# Patient Record
Sex: Female | Born: 1990 | Race: White | Hispanic: No | Marital: Married | Smoking: Never smoker
Health system: Southern US, Community
[De-identification: ages and names within clinical notes are randomized; demographics above are authoritative.]

## PROBLEM LIST (undated history)

## (undated) DIAGNOSIS — B009 Herpesviral infection, unspecified: Secondary | ICD-10-CM

## (undated) DIAGNOSIS — F329 Major depressive disorder, single episode, unspecified: Secondary | ICD-10-CM

## (undated) DIAGNOSIS — F419 Anxiety disorder, unspecified: Secondary | ICD-10-CM

## (undated) DIAGNOSIS — G43909 Migraine, unspecified, not intractable, without status migrainosus: Secondary | ICD-10-CM

## (undated) DIAGNOSIS — N009 Acute nephritic syndrome with unspecified morphologic changes: Secondary | ICD-10-CM

## (undated) DIAGNOSIS — F32A Depression, unspecified: Secondary | ICD-10-CM

## (undated) DIAGNOSIS — Z8489 Family history of other specified conditions: Secondary | ICD-10-CM

## (undated) DIAGNOSIS — R87629 Unspecified abnormal cytological findings in specimens from vagina: Secondary | ICD-10-CM

## (undated) HISTORY — DX: Depression, unspecified: F32.A

## (undated) HISTORY — PX: NO PAST SURGERIES: SHX2092

## (undated) HISTORY — DX: Acute nephritic syndrome with unspecified morphologic changes: N00.9

## (undated) HISTORY — DX: Major depressive disorder, single episode, unspecified: F32.9

## (undated) HISTORY — DX: Anxiety disorder, unspecified: F41.9

## (undated) HISTORY — DX: Herpesviral infection, unspecified: B00.9

## (undated) HISTORY — DX: Unspecified abnormal cytological findings in specimens from vagina: R87.629

---

## 2014-04-14 ENCOUNTER — Ambulatory Visit (INDEPENDENT_AMBULATORY_CARE_PROVIDER_SITE_OTHER): Payer: BC Managed Care – PPO | Admitting: Emergency Medicine

## 2014-04-14 VITALS — BP 115/60 | HR 87 | Temp 98.2°F | Resp 18 | Ht 63.0 in | Wt 124.0 lb

## 2014-04-14 DIAGNOSIS — F3289 Other specified depressive episodes: Secondary | ICD-10-CM

## 2014-04-14 DIAGNOSIS — F329 Major depressive disorder, single episode, unspecified: Secondary | ICD-10-CM

## 2014-04-14 DIAGNOSIS — F32A Depression, unspecified: Secondary | ICD-10-CM

## 2014-04-14 DIAGNOSIS — F411 Generalized anxiety disorder: Secondary | ICD-10-CM

## 2014-04-14 DIAGNOSIS — R51 Headache: Secondary | ICD-10-CM

## 2014-04-14 DIAGNOSIS — F419 Anxiety disorder, unspecified: Secondary | ICD-10-CM

## 2014-04-14 MED ORDER — LORAZEPAM 0.5 MG PO TABS: 0.5000 mg | ORAL_TABLET | Freq: Two times a day (BID) | ORAL | Status: DC | PRN

## 2014-04-14 NOTE — Patient Instructions (Signed)

## 2014-04-14 NOTE — Progress Notes (Signed)
[facility] [address] [place] [address] [phone]  Date:  04/14/2014   Name:  Julie Webb   DOB:  11-27-1990   MRN:  [id number]  PCP:  No PCP Per Patient    Chief Complaint: Anxiety   History of Present Illness:  Keeleigh Terris is a 23 y.o. 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  Is under treatment for depression and anxiety.  Recently her doctor cut her ativan out and her anxiety level is worse.  No thoughts of harm to self or others.  Has appt with psychiatrist in July to begin treatment.  Appetite is fair.  Sleeping a lot.  Lives with boyfriend who sells cell phones and is in school at [employer].  She is getting ready to take her boards and start a new job shortly thereafter.  Relationship with boyfriend and family is ok.   Says she has stress headaches over the past several months usually associated with clinical rotations.  No neuro or visual symptoms, antecedent illness or injury.  Says headaches occur twice or thrice a week and last all day.  No history of migraines.   Denies other complaint or health concern today.   There are no active problems to display for this patient.   Past Medical History  Diagnosis Date  . Anxiety   . Depression     History reviewed. No pertinent past surgical history.  History  Substance Use Topics  . Smoking status: Never Smoker   . Smokeless tobacco: Not on file  . Alcohol Use: Not on file    Family History  Problem Relation Age of Onset  . Hyperlipidemia Mother   . Mental illness Mother   . Hypertension Father   . Heart disease Maternal Grandmother   . Hyperlipidemia Maternal Grandmother   . Hypertension Maternal Grandmother   . Heart disease Maternal Grandfather   . Hyperlipidemia Maternal Grandfather   . Hypertension Maternal Grandfather     Not on File  Medication list has been reviewed and updated.  No current outpatient prescriptions on  file prior to visit.   No current facility-administered medications on file prior to visit.    Review of Systems:  As per HPI, otherwise negative.    Physical Examination: Filed Vitals:   04/14/14 1629  BP: 115/60  Pulse: 87  Temp: 98.2 F (36.8 C)  Resp: 18   Filed Vitals:   04/14/14 1629  Height: 5\' 3"  (1.6 m)  Weight: 124 lb (56.246 kg)   Body mass index is 21.97 kg/(m^2). Ideal Body Weight: Weight in (lb) to have BMI = 25: 140.8  GEN: WDWN, NAD, Non-toxic, A & O x 3 HEENT: Atraumatic, Normocephalic. Neck supple. No masses, No LAD. Ears and Nose: No external deformity. CV: RRR, No M/G/R. No JVD. No thrill. No extra heart sounds. PULM: CTA B, no wheezes, crackles, rhonchi. No retractions. No resp. distress. No accessory muscle use. ABD: S, NT, ND, +BS. No rebound. No HSM. EXTR: No c/c/e NEURO Normal gait.  PSYCH: Normally interactive. Conversant. Not depressed or anxious appearing.  Calm demeanor.    Assessment and Plan: Depression Anxiety Tension headaches  Signed,  Ellison Carwin, MD

## 2014-08-07 ENCOUNTER — Encounter (HOSPITAL_BASED_OUTPATIENT_CLINIC_OR_DEPARTMENT_OTHER): Payer: Self-pay | Admitting: Emergency Medicine

## 2014-08-07 ENCOUNTER — Emergency Department (HOSPITAL_BASED_OUTPATIENT_CLINIC_OR_DEPARTMENT_OTHER)
Admission: EM | Admit: 2014-08-07 | Discharge: 2014-08-07 | Disposition: A | Discharge status: Home / Self Care | Payer: 59 | Attending: Emergency Medicine | Admitting: Emergency Medicine

## 2014-08-07 DIAGNOSIS — F419 Anxiety disorder, unspecified: Secondary | ICD-10-CM | POA: Diagnosis not present

## 2014-08-07 DIAGNOSIS — F329 Major depressive disorder, single episode, unspecified: Secondary | ICD-10-CM | POA: Diagnosis not present

## 2014-08-07 DIAGNOSIS — Z79899 Other long term (current) drug therapy: Secondary | ICD-10-CM | POA: Diagnosis not present

## 2014-08-07 DIAGNOSIS — N760 Acute vaginitis: Secondary | ICD-10-CM | POA: Insufficient documentation

## 2014-08-07 DIAGNOSIS — Z3202 Encounter for pregnancy test, result negative: Secondary | ICD-10-CM | POA: Insufficient documentation

## 2014-08-07 DIAGNOSIS — N939 Abnormal uterine and vaginal bleeding, unspecified: Secondary | ICD-10-CM | POA: Insufficient documentation

## 2014-08-07 DIAGNOSIS — B9689 Other specified bacterial agents as the cause of diseases classified elsewhere: Secondary | ICD-10-CM

## 2014-08-07 LAB — URINALYSIS, ROUTINE W REFLEX MICROSCOPIC
Bilirubin Urine: NEGATIVE
Glucose, UA: NEGATIVE mg/dL
Hgb urine dipstick: NEGATIVE
KETONES UR: NEGATIVE mg/dL
Nitrite: NEGATIVE
Protein, ur: NEGATIVE mg/dL
Specific Gravity, Urine: 1.021 (ref 1.005–1.030)
UROBILINOGEN UA: 0.2 mg/dL (ref 0.0–1.0)
pH: 6.5 (ref 5.0–8.0)

## 2014-08-07 LAB — URINE MICROSCOPIC-ADD ON

## 2014-08-07 LAB — WET PREP, GENITAL
Trich, Wet Prep: NONE SEEN
Yeast Wet Prep HPF POC: NONE SEEN

## 2014-08-07 LAB — PREGNANCY, URINE: Preg Test, Ur: NEGATIVE

## 2014-08-07 MED ORDER — METRONIDAZOLE 500 MG PO TABS: 500.0000 mg | ORAL_TABLET | Freq: Two times a day (BID) | ORAL | Status: DC

## 2014-08-07 MED ORDER — CEFTRIAXONE SODIUM 250 MG IJ SOLR
250.0000 mg | Freq: Once | INTRAMUSCULAR | Status: AC
  Administered 2014-08-07: 250 mg via INTRAMUSCULAR
  Filled 2014-08-07: qty 250

## 2014-08-07 MED ORDER — AZITHROMYCIN 250 MG PO TABS
1000.0000 mg | ORAL_TABLET | Freq: Once | ORAL | Status: AC
  Administered 2014-08-07: 1000 mg via ORAL
  Filled 2014-08-07: qty 4

## 2014-08-07 NOTE — ED Notes (Signed)
Pt admits to passing moderate amount vaginal tissue 5 days ago with vaginal  bleeding for the next 4 days, today pt denies  Currently having vaginal bleeding

## 2014-08-07 NOTE — ED Notes (Signed)
Pt sts apprx. 5 days ago she experienced some pink tissue-like discharge and then had vaginal bleeding for 4 days. Today she has some slight cramping.

## 2014-08-07 NOTE — Discharge Instructions (Signed)
Bacterial Vaginosis Bacterial vaginosis is a vaginal infection that occurs when the normal balance of bacteria in the vagina is disrupted. It results from an overgrowth of certain bacteria. This is the most common vaginal infection in women of childbearing age. Treatment is important to prevent complications, especially in pregnant women, as it can cause a premature delivery. CAUSES  Bacterial vaginosis is caused by an increase in harmful bacteria that are normally present in smaller amounts in the vagina. Several different kinds of bacteria can cause bacterial vaginosis. However, the reason that the condition develops is not fully understood. RISK FACTORS Certain activities or behaviors can put you at an increased risk of developing bacterial vaginosis, including:  Having a new sex partner or multiple sex partners.  Douching.  Using an intrauterine device (IUD) for contraception. Women do not get bacterial vaginosis from toilet seats, bedding, swimming pools, or contact with objects around them. SIGNS AND SYMPTOMS  Some women with bacterial vaginosis have no signs or symptoms. Common symptoms include:  Grey vaginal discharge.  A fishlike odor with discharge, especially after sexual intercourse.  Itching or burning of the vagina and vulva.  Burning or pain with urination. DIAGNOSIS  Your health care provider will take a medical history and examine the vagina for signs of bacterial vaginosis. A sample of vaginal fluid may be taken. Your health care provider will look at this sample under a microscope to check for bacteria and abnormal cells. A vaginal pH test may also be done.  TREATMENT  Bacterial vaginosis may be treated with antibiotic medicines. These may be given in the form of a pill or a vaginal cream. A second round of antibiotics may be prescribed if the condition comes back after treatment.  HOME CARE INSTRUCTIONS   Only take over-the-counter or prescription medicines as  directed by your health care provider.  If antibiotic medicine was prescribed, take it as directed. Make sure you finish it even if you start to feel better.  Do not have sex until treatment is completed.  Tell all sexual partners that you have a vaginal infection. They should see their health care provider and be treated if they have problems, such as a mild rash or itching.  Practice safe sex by using condoms and only having one sex partner. SEEK MEDICAL CARE IF:   Your symptoms are not improving after 3 days of treatment.  You have increased discharge or pain.  You have a fever. MAKE SURE YOU:   Understand these instructions.  Will watch your condition.  Will get help right away if you are not doing well or get worse. FOR MORE INFORMATION  Centers for Disease Control and Prevention, Division of STD Prevention: AppraiserFraud.fi American Sexual Health Association (ASHA): www.ashastd.org  Document Released: 10/06/2005 Document Revised: 07/27/2013 Document Reviewed: 05/18/2013 [facility] Patient Information 2015 [place], [place]. This information is not intended to replace advice given to you by your health care provider. Make sure you discuss any questions you have with your health care provider. Abnormal Uterine Bleeding Abnormal uterine bleeding can affect women at various stages in life, including teenagers, women in their reproductive years, pregnant women, and women who have reached menopause. Several kinds of uterine bleeding are considered abnormal, including:  Bleeding or spotting between periods.   Bleeding after sexual intercourse.   Bleeding that is heavier or more than normal.   Periods that last longer than usual.  Bleeding after menopause.  Many cases of abnormal uterine bleeding are minor and simple to treat,  while others are more serious. Any type of abnormal bleeding should be evaluated by your health care provider. Treatment will depend on the cause of the  bleeding. HOME CARE INSTRUCTIONS Monitor your condition for any changes. The following actions may help to alleviate any discomfort you are experiencing:  Avoid the use of tampons and douches as directed by your health care provider.  Change your pads frequently. You should get regular pelvic exams and Pap tests. Keep all follow-up appointments for diagnostic tests as directed by your health care provider.  SEEK MEDICAL CARE IF:   Your bleeding lasts more than 1 week.   You feel dizzy at times.  SEEK IMMEDIATE MEDICAL CARE IF:   You pass out.   You are changing pads every 15 to 30 minutes.   You have abdominal pain.  You have a fever.   You become sweaty or weak.   You are passing large blood clots from the vagina.   You start to feel nauseous and vomit. MAKE SURE YOU:   Understand these instructions.  Will watch your condition.  Will get help right away if you are not doing well or get worse. Document Released: 10/06/2005 Document Revised: 10/11/2013 Document Reviewed: 05/05/2013 [facility] Patient Information 2015 [place], [place]. This information is not intended to replace advice given to you by your health care provider. Make sure you discuss any questions you have with your health care provider.

## 2014-08-07 NOTE — ED Provider Notes (Signed)
CSN: [id number]     Arrival date & time 08/07/14  2121 History  This chart was scribed for Julie Freiberg, MD by Tula Nakayama, ED Scribe. This patient was seen in room MH01/MH01 and the patient's care was started at 10:38 PM.   Chief Complaint  Patient presents with  . Vaginal Bleeding   The history is provided by the patient. No language interpreter was used.   HPI Comments: Jasline Buskirk is a 23 y.o. female with a history of depression and anxiety who presents to the Emergency Department after passing vaginal tissue, which she describes as the size of her hand, 5 days ago followed by vaginal bleeding for 4 days. Her LNMP was 6 weeks prior to onset of symptoms. Pt states her menstrual period normally occurs every 4 weeks and lasts 7 days. She denies knowledge of recent pregnancy. Pt denies painful urination as an associated symptom. She has had some abd cramping.  Timing of symptoms once, duration constant. No exacerbating or alleviating factors.   Past Medical History  Diagnosis Date  . Anxiety   . Depression    History reviewed. No pertinent past surgical history. Family History  Problem Relation Age of Onset  . Hyperlipidemia Mother   . Mental illness Mother   . Hypertension Father   . Heart disease Maternal Grandmother   . Hyperlipidemia Maternal Grandmother   . Hypertension Maternal Grandmother   . Heart disease Maternal Grandfather   . Hyperlipidemia Maternal Grandfather   . Hypertension Maternal Grandfather    History  Substance Use Topics  . Smoking status: Never Smoker   . Smokeless tobacco: Not on file  . Alcohol Use: Yes   OB History   Grav Para Term Preterm Abortions TAB SAB Ect Mult Living                 Review of Systems  Genitourinary: Positive for vaginal bleeding. Negative for dysuria and difficulty urinating.  All other systems reviewed and are negative.     Allergies  Review of patient's allergies indicates no known allergies.  Home Medications    Prior to Admission medications   Medication Sig Start Date End Date Taking? Authorizing Provider  buPROPion (WELLBUTRIN) 100 MG tablet Take 100 mg by mouth 2 (two) times daily.    Historical Provider, MD  FLUoxetine (PROZAC) 10 MG tablet Take 30 mg by mouth daily.    Historical Provider, MD  LORazepam (ATIVAN) 0.5 MG tablet Take 1 tablet (0.5 mg total) by mouth 2 (two) times daily as needed for anxiety. 04/14/14   Roselee Culver, MD  Norgestimate-Ethinyl Estradiol Triphasic (ORTHO TRI-CYCLEN LO) 0.18/0.215/0.25 MG-25 MCG tab Take 1 tablet by mouth daily.    Historical Provider, MD   BP 159/82  Pulse 96  Temp(Src) 98.2 F (36.8 C) (Oral)  Resp 18  Ht 5\' 5"  (1.651 m)  Wt 120 lb (54.432 kg)  BMI 19.97 kg/m2  SpO2 100%  LMP 06/26/2014 Physical Exam  Vitals reviewed. Constitutional: She is oriented to person, place, and time. She appears well-developed and well-nourished. No distress.  HENT:  Head: Normocephalic and atraumatic.  Right Ear: External ear normal.  Left Ear: External ear normal.  Eyes: Conjunctivae and EOM are normal. Pupils are equal, round, and reactive to light.  Neck: Normal range of motion. Neck supple. No tracheal deviation present.  Cardiovascular: Normal rate, regular rhythm, normal heart sounds and intact distal pulses.   Pulmonary/Chest: Effort normal and breath sounds normal. No respiratory distress.  Abdominal:  Soft. Bowel sounds are normal. There is no tenderness.  Genitourinary: Cervix exhibits motion tenderness, discharge (yellow/[place]) and friability.  Musculoskeletal: Normal range of motion.  Neurological: She is alert and oriented to person, place, and time.  Skin: Skin is warm and dry.  Psychiatric: She has a normal mood and affect. Her behavior is normal.    ED Course  Procedures (including critical care time) DIAGNOSTIC STUDIES: Oxygen Saturation is 100% on RA, normal by my interpretation.    COORDINATION OF CARE: 10:41 PM Discussed  treatment plan with pt at bedside and pt agreed to plan.  Labs Review Labs Reviewed  URINALYSIS, ROUTINE W REFLEX MICROSCOPIC - Abnormal; Notable for the following:    APPearance CLOUDY (*)    Leukocytes, UA LARGE (*)    All other components within normal limits  URINE MICROSCOPIC-ADD ON - Abnormal; Notable for the following:    Squamous Epithelial / LPF FEW (*)    Bacteria, UA MANY (*)    All other components within normal limits  PREGNANCY, URINE    Imaging Review No results found.   EKG Interpretation None      MDM   Final diagnoses:  None    23 y.o. female with pertinent PMH of anxiety, depression presents with 4 days of vaginal bleeding and passage of tissue.  Patient denies urinary symptoms, has not had anything other than some mild cramping. She did not taken pregnancy tests, her last menstrual period was 6 weeks prior to current bleeding episode.  On arrival today vitals signs and physical exam as above. Physical exam demonstrated erythematous and friable cervix with some yellow discharge. Treated empirically for cervicitis with Rocephin and azithromycin. Wet prep revealed bacterial vaginosis. Urine pregnancy test other labs today unremarkable. Patient currently denies dysuria. Will not treat asymptomatic bacteriuria. Patient discharged home with standard return precautions and Flagyl.    1. Vaginal bleeding   2. Bacterial vaginosis           Julie Freiberg, MD 08/07/14 [phone]

## 2014-08-08 LAB — GC/CHLAMYDIA PROBE AMP
CT PROBE, AMP APTIMA: NEGATIVE
GC PROBE AMP APTIMA: NEGATIVE

## 2014-08-08 LAB — HIV ANTIBODY (ROUTINE TESTING W REFLEX): HIV: NONREACTIVE

## 2014-08-08 LAB — RPR

## 2015-05-29 ENCOUNTER — Encounter (HOSPITAL_BASED_OUTPATIENT_CLINIC_OR_DEPARTMENT_OTHER): Payer: Self-pay

## 2015-05-29 ENCOUNTER — Emergency Department (HOSPITAL_BASED_OUTPATIENT_CLINIC_OR_DEPARTMENT_OTHER)
Admission: EM | Admit: 2015-05-29 | Discharge: 2015-05-29 | Disposition: A | Discharge status: Home / Self Care | Payer: 59 | Attending: Emergency Medicine | Admitting: Emergency Medicine

## 2015-05-29 DIAGNOSIS — F329 Major depressive disorder, single episode, unspecified: Secondary | ICD-10-CM | POA: Insufficient documentation

## 2015-05-29 DIAGNOSIS — F419 Anxiety disorder, unspecified: Secondary | ICD-10-CM | POA: Diagnosis present

## 2015-05-29 DIAGNOSIS — F32A Depression, unspecified: Secondary | ICD-10-CM

## 2015-05-29 DIAGNOSIS — Z79899 Other long term (current) drug therapy: Secondary | ICD-10-CM | POA: Diagnosis not present

## 2015-05-29 MED ORDER — BUPROPION HCL 100 MG PO TABS: 100.0000 mg | ORAL_TABLET | Freq: Two times a day (BID) | ORAL | Status: DC

## 2015-05-29 MED ORDER — LORAZEPAM 0.5 MG PO TABS: 0.5000 mg | ORAL_TABLET | Freq: Two times a day (BID) | ORAL | Status: DC | PRN

## 2015-05-29 MED ORDER — FLUOXETINE HCL 10 MG PO TABS: 30.0000 mg | ORAL_TABLET | Freq: Every day | ORAL | Status: DC

## 2015-05-29 NOTE — ED Notes (Signed)
C/o increase in anxiety/depression-pt states she took herself off her meds months ago-did not inform PCP-denies SI/HI-pt alert-cooperative-NAD

## 2015-05-29 NOTE — ED Provider Notes (Signed)
CSN: [id number]     Arrival date & time 05/29/15  1350 History   First MD Initiated Contact with Patient 05/29/15 1500     Chief Complaint  Patient presents with  . Anxiety     (Consider location/radiation/quality/duration/timing/severity/associated sxs/prior Treatment) The history is provided by the patient.  Julie Webb is a 24 y.o. female hx of anxiety, depression, here with anxiety, depression. Patient has history of anxiety and depression. She has been on wellbutrin and prozac and ativan before but took herself off a year ago. For the last few months, has been more anxious. 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 Had another panic attack yesterday. Denies suicidal or homicidal ideations. No previous psych hospitalizations. No hallucinations.    Past Medical History  Diagnosis Date  . Anxiety   . Depression    History reviewed. No pertinent past surgical history. Family History  Problem Relation Age of Onset  . Hyperlipidemia Mother   . Mental illness Mother   . Hypertension Father   . Heart disease Maternal Grandmother   . Hyperlipidemia Maternal Grandmother   . Hypertension Maternal Grandmother   . Heart disease Maternal Grandfather   . Hyperlipidemia Maternal Grandfather   . Hypertension Maternal Grandfather    History  Substance Use Topics  . Smoking status: Never Smoker   . Smokeless tobacco: Not on file  . Alcohol Use: Yes   OB History    No data available     Review of Systems  Psychiatric/Behavioral: Positive for dysphoric mood.  All other systems reviewed and are negative.     Allergies  Review of patient's allergies indicates no known allergies.  Home Medications   Prior to Admission medications   Medication Sig Start Date Webb Date Taking? Authorizing Provider  buPROPion (WELLBUTRIN) 100 MG tablet Take 100 mg by mouth 2 (two) times daily.    Historical Provider, MD  FLUoxetine (PROZAC) 10 MG tablet Take 30 mg by mouth  daily.    Historical Provider, MD  LORazepam (ATIVAN) 0.5 MG tablet Take 1 tablet (0.5 mg total) by mouth 2 (two) times daily as needed for anxiety. 04/14/14   Roselee Culver, MD   BP 137/81 mmHg  Pulse 80  Temp(Src) 98 F (36.7 C) (Oral)  Resp 18  Ht 5\' 3"  (1.6 m)  Wt 123 lb (55.792 kg)  BMI 21.79 kg/m2  SpO2 100%  LMP 04/27/2015 (Approximate) Physical Exam  Constitutional: She is oriented to person, place, and time. She appears well-developed.  Depressed   HENT:  Head: Normocephalic.  Mouth/Throat: Oropharynx is clear and moist.  Eyes: Conjunctivae are normal. Pupils are equal, round, and reactive to light.  Neck: Normal range of motion. Neck supple.  Cardiovascular: Normal rate, regular rhythm and normal heart sounds.   Pulmonary/Chest: Effort normal and breath sounds normal. No respiratory distress. She has no wheezes. She has no rales.  Abdominal: Soft. Bowel sounds are normal. She exhibits no distension. There is no tenderness. There is no rebound and no guarding.  Musculoskeletal: Normal range of motion. She exhibits no edema or tenderness.  Neurological: She is alert and oriented to person, place, and time. No cranial nerve deficit. Coordination normal.  Skin: Skin is warm and dry.  Psychiatric:  Depressed, anxious, not suicidal   Nursing note and vitals reviewed.   ED Course  Procedures (including critical care time) Labs Review Labs Reviewed - No data to display  Imaging Review No results found.  EKG Interpretation None      MDM   Final diagnoses:  None    Julie Webb is a 24 y.o. female here with depression. No suicidal ideations. No hallucinations. Will restart wellbutrin and prozac and prn ativan. I gave her strict warning that prozac has black box warning of suicidal ideation and that she needs to come to the ED if she has suicidal ideation. She will see PCP for reassessment.     Wandra Arthurs, MD 05/29/15 [phone]

## 2015-05-29 NOTE — Discharge Instructions (Signed)
Take wellbutrin and prozac as prescribed.   Take ativan as needed for panic attacks.   Please see your doctor for follow up  Return to ER if you have thoughts of harming yourself or others, hallucinations.

## 2015-05-30 ENCOUNTER — Ambulatory Visit (INDEPENDENT_AMBULATORY_CARE_PROVIDER_SITE_OTHER): Payer: 59 | Admitting: Family

## 2015-05-30 ENCOUNTER — Encounter: Payer: Self-pay | Admitting: Family

## 2015-05-30 VITALS — BP 104/70 | HR 98 | Temp 98.0°F | Resp 16 | Ht 64.0 in | Wt 124.6 lb

## 2015-05-30 DIAGNOSIS — Z87448 Personal history of other diseases of urinary system: Secondary | ICD-10-CM | POA: Diagnosis not present

## 2015-05-30 DIAGNOSIS — N059 Unspecified nephritic syndrome with unspecified morphologic changes: Secondary | ICD-10-CM | POA: Diagnosis not present

## 2015-05-30 DIAGNOSIS — R5382 Chronic fatigue, unspecified: Secondary | ICD-10-CM

## 2015-05-30 DIAGNOSIS — F418 Other specified anxiety disorders: Secondary | ICD-10-CM

## 2015-05-30 DIAGNOSIS — F329 Major depressive disorder, single episode, unspecified: Secondary | ICD-10-CM

## 2015-05-30 DIAGNOSIS — F32A Depression, unspecified: Secondary | ICD-10-CM

## 2015-05-30 DIAGNOSIS — G43909 Migraine, unspecified, not intractable, without status migrainosus: Secondary | ICD-10-CM | POA: Insufficient documentation

## 2015-05-30 DIAGNOSIS — F419 Anxiety disorder, unspecified: Secondary | ICD-10-CM

## 2015-05-30 DIAGNOSIS — F4010 Social phobia, unspecified: Secondary | ICD-10-CM | POA: Insufficient documentation

## 2015-05-30 DIAGNOSIS — Z309 Encounter for contraceptive management, unspecified: Secondary | ICD-10-CM

## 2015-05-30 DIAGNOSIS — G43809 Other migraine, not intractable, without status migrainosus: Secondary | ICD-10-CM

## 2015-05-30 DIAGNOSIS — G43709 Chronic migraine without aura, not intractable, without status migrainosus: Secondary | ICD-10-CM | POA: Insufficient documentation

## 2015-05-30 LAB — CBC WITH DIFFERENTIAL/PLATELET
Basophils Absolute: 0.1 10*3/uL (ref 0.0–0.1)
Basophils Relative: 0.9 % (ref 0.0–3.0)
Eosinophils Absolute: 0.1 10*3/uL (ref 0.0–0.7)
Eosinophils Relative: 1.1 % (ref 0.0–5.0)
HCT: 40.4 % (ref 36.0–46.0)
HEMOGLOBIN: 13.6 g/dL (ref 12.0–15.0)
LYMPHS ABS: 2.6 10*3/uL (ref 0.7–4.0)
Lymphocytes Relative: 42.8 % (ref 12.0–46.0)
MCHC: 33.7 g/dL (ref 30.0–36.0)
MCV: 89.9 fl (ref 78.0–100.0)
MONOS PCT: 9.9 % (ref 3.0–12.0)
Monocytes Absolute: 0.6 10*3/uL (ref 0.1–1.0)
Neutro Abs: 2.7 10*3/uL (ref 1.4–7.7)
Neutrophils Relative %: 45.3 % (ref 43.0–77.0)
Platelets: 212 10*3/uL (ref 150.0–400.0)
RBC: 4.49 Mil/uL (ref 3.87–5.11)
RDW: 13.1 % (ref 11.5–15.5)
WBC: 6 10*3/uL (ref 4.0–10.5)

## 2015-05-30 LAB — BASIC METABOLIC PANEL
BUN: 11 mg/dL (ref 6–23)
CHLORIDE: 106 meq/L (ref 96–112)
CO2: 26 meq/L (ref 19–32)
Calcium: 9.3 mg/dL (ref 8.4–10.5)
Creatinine, Ser: 0.67 mg/dL (ref 0.40–1.20)
GFR: 115.04 mL/min (ref >60.00)
GLUCOSE: 86 mg/dL (ref 70–99)
POTASSIUM: 3.6 meq/L (ref 3.5–5.1)
SODIUM: 139 meq/L (ref 135–145)

## 2015-05-30 LAB — TSH: TSH: 1.41 u[IU]/mL (ref 0.35–4.50)

## 2015-05-30 MED ORDER — NORETHIN ACE-ETH ESTRAD-FE 1.5-30 MG-MCG PO TABS: 1.0000 | ORAL_TABLET | Freq: Every day | ORAL | Status: DC

## 2015-05-30 MED ORDER — SUMATRIPTAN SUCCINATE 50 MG PO TABS: 50.0000 mg | ORAL_TABLET | ORAL | Status: DC | PRN

## 2015-05-30 NOTE — Assessment & Plan Note (Signed)
>>  ASSESSMENT AND PLAN FOR MIGRAINE WRITTEN ON 05/30/2015 11:10 AM BY O'SULLIVAN, Dajia Gunnels, NP  rec trial of prn imitrex .

## 2015-05-30 NOTE — Assessment & Plan Note (Addendum)
Previously on prozac, wellbutrin and ativan. Advised pt to start prozac for now and we can add other meds back in if needed.  Due to fatigue, will also check cbc to rule out anemia and TSH to rule out hypothyroid.

## 2015-05-30 NOTE — Assessment & Plan Note (Signed)
rec trial of prn imitrex.

## 2015-05-30 NOTE — Assessment & Plan Note (Signed)
Obtain HCG.  If negative, start OCP.

## 2015-05-30 NOTE — Progress Notes (Signed)
Pre visit review using our clinic review tool, if applicable. No additional management support is needed unless otherwise documented below in the visit note. 

## 2015-05-30 NOTE — Progress Notes (Signed)
Subjective:    Patient ID: Julie Webb, female    DOB: Jan 22, 1991, 24 y.o.   MRN: [id number]  HPI  Legend is a 24 yr old female who presents today to establish care.  Pmhx is significant for the following:  Pt has hx of anxiety/depression.  Reports she was on medication for 2 years.  Stopped meds 1 year ago.  Felt good for more than 5 months but just over the past 1 month her panic attacks/anxiety have worsened.  She reports that she works nights, goes to bed 8AM gets up at 5:30 PM.  If she doesn't work she can sleep 14 hours.  Reports + fatigue, no snoring.  Unmotivated.  Lays on couch.  Recent increase in anxiety and panic attacks. Reports that her father and brother recently lost their jobs and they are putting a lot of financial pressure on the patient and her fiance to help them.  Works as a Marine scientist.  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  Has lost 15 pounds unintentionally but boyfriend notes that she has improved diet.     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 She reports no residual kidney function issues.    Reports that she believes she had an early miscarriage last year.  Reports that she has resumed normal periods. Using condoms. Desires another form of birth control.  LMP 7/18-   Last pap smear 1 year ago- normal per pt.    Review of Systems  Constitutional:       15 pound weight loss over a few weeks  HENT: Positive for sneezing. Negative for rhinorrhea.   Respiratory: Negative for cough and shortness of breath.   Cardiovascular: Negative for leg swelling.  Gastrointestinal: Negative for diarrhea.       Notes some constipation  Genitourinary: Negative for dysuria.  Musculoskeletal: Negative for myalgias and arthralgias.  Skin: Negative for rash.  Neurological:       Reports HA's every other day.  Headaches migraine at time other times "minor annoyances."   2 migraines a month  Hematological: Negative for adenopathy.  Psychiatric/Behavioral:       See HPI    Past  Medical History  Diagnosis Date  . Anxiety   . Depression   . Glomerulonephritis, poststreptococcal     age 58    Social History   Social History  . Marital Status: Single    Spouse Name: N/A  . Number of Children: N/A  . Years of Education: N/A   Occupational History  . Not on file.   Social History Main Topics  . Smoking status: Never Smoker   . Smokeless tobacco: Not on file  . Alcohol Use: Yes  . Drug Use: No  . Sexual Activity: Yes    Birth Control/ Protection: None   Other Topics Concern  . 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  Family History  Problem Relation Age of Onset  . Hyperlipidemia Mother   . Mental illness Mother   . Hypertension Father   . Heart disease Maternal Grandmother   . Hyperlipidemia Maternal Grandmother   . Hypertension Maternal Grandmother   . Heart disease Maternal Grandfather   . Hyperlipidemia Maternal Grandfather   .  Hypertension Maternal Grandfather     No Known Allergies  Current Outpatient Prescriptions on File Prior to Visit  Medication Sig Dispense Refill  . FLUoxetine (PROZAC) 10 MG tablet Take 3 tablets (30 mg total) by mouth daily. 30 tablet 0   No current facility-administered medications on file prior to visit.    BP 104/70 mmHg  Pulse 98  Temp(Src) 98 F (36.7 C) (Oral)  Resp 16  Ht 5\' 4"  (1.626 m)  Wt 124 lb 9.6 oz (56.518 kg)  BMI 21.38 kg/m2  SpO2 98%  LMP 05/07/2015       Objective:   Physical Exam  Constitutional: She is oriented to person, place, and time. She appears well-developed and well-nourished.  HENT:  Head: Normocephalic and atraumatic.  Eyes: No scleral icterus.  Cardiovascular: Normal rate, regular rhythm and normal heart sounds.   No murmur heard. Pulmonary/Chest: Effort normal and breath sounds normal. No  respiratory distress. She has no wheezes.  Musculoskeletal: She exhibits no edema.  Neurological: She is alert and oriented to person, place, and time.  Psychiatric: Her behavior is normal. Judgment and thought content normal.  Flat affect          Assessment & Plan:  30 minutes spent with pt. >50% of this time was spent counseling patient on anxiety, depression/contraceptive management.

## 2015-05-30 NOTE — Assessment & Plan Note (Signed)
Occurred at age 24. Will obtain BMET to assess baseline renal function.

## 2015-05-30 NOTE — Patient Instructions (Addendum)
Please complete lab work prior to leaving. Start prozac 10mg  (2 tabs once daily for 1 week, then increase to 3 tabs once daily) Follow up in 1 month. Start microgestin (birth control pill tonight) You may use imitrex as needed for migraine. Max 2 tabs/24 hrs.  Welcome to [facility]!

## 2015-05-31 ENCOUNTER — Encounter: Payer: Self-pay | Admitting: Family

## 2015-06-05 ENCOUNTER — Telehealth: Payer: Self-pay

## 2015-06-05 ENCOUNTER — Telehealth: Payer: Self-pay | Admitting: Family

## 2015-06-05 DIAGNOSIS — Z0279 Encounter for issue of other medical certificate: Secondary | ICD-10-CM

## 2015-06-05 NOTE — Telephone Encounter (Signed)
°  Relation to OM:AYOK Call back Royal:  Reason for call: pt would like for you to call her states that she received text notification from [employer] stating for her to check with her primary doctor to verify if she sent them the medical certification form back to them. States [employer] is stating they have not received her documentation from her doctor.

## 2015-06-05 NOTE — Telephone Encounter (Signed)
Spoke with pt and she voices understanding.  

## 2015-06-05 NOTE — Telephone Encounter (Signed)
Received FMLA forms. Filled out as much as possible and forwarded to [facility]. JG//CMA

## 2015-06-05 NOTE — Telephone Encounter (Signed)
Spoke with pt and she states she does need the FMLA for her Anxiety and Depression. Pt states she has missed 05/30/15 and 05/31/15. Informed pt that she will be notified as soon as paperwork is ready. Paper work has been forwarded to provider.

## 2015-06-08 NOTE — Telephone Encounter (Signed)
Form faxed to [employer] @ [phone]. Left detailed message informing pt of fax completion and there will be a $20 fee for form completion. Forms placed in [place] folder for processing.

## 2015-06-08 NOTE — Telephone Encounter (Signed)
Form complete and placed in [place] folder.

## 2015-06-08 NOTE — Telephone Encounter (Signed)
Form faxed to [employer] @ [phone]. Left detailed message informing pt of fax completion and there will be a $20 fee for form completion. Forms placed in [place] folder for processing.

## 2015-06-11 NOTE — Telephone Encounter (Signed)
Caller name: Lattie Haw  Relation to pt: [employer]  Call back number: [phone] ext [phone]   Reason for call:  [employer] needs clarification regarding question # 7 last page frequency of flare ups is it one time per month or two times a month.

## 2015-06-11 NOTE — Telephone Encounter (Signed)
Julie Webb-- do you still have pt's FMLA forms re: below ?

## 2015-06-12 NOTE — Telephone Encounter (Signed)
Attempted to reach Ophiem at [facility] and left detailed message on voicemail that frequency listed on last page states 2 episodes per month and each episode could last from 1 - 2 days each and to call if any further questions.

## 2015-06-12 NOTE — Telephone Encounter (Signed)
Given back to [place].

## 2015-07-03 ENCOUNTER — Telehealth: Payer: Self-pay | Admitting: Family

## 2015-07-03 ENCOUNTER — Ambulatory Visit: Payer: 59 | Admitting: Family

## 2015-07-06 ENCOUNTER — Encounter: Payer: Self-pay | Admitting: Family

## 2015-07-06 ENCOUNTER — Ambulatory Visit (INDEPENDENT_AMBULATORY_CARE_PROVIDER_SITE_OTHER): Payer: 59 | Admitting: Family

## 2015-07-06 VITALS — BP 100/72 | HR 83 | Temp 98.4°F | Resp 16 | Ht 64.0 in | Wt 120.0 lb

## 2015-07-06 DIAGNOSIS — F418 Other specified anxiety disorders: Secondary | ICD-10-CM

## 2015-07-06 DIAGNOSIS — R634 Abnormal weight loss: Secondary | ICD-10-CM | POA: Diagnosis not present

## 2015-07-06 DIAGNOSIS — Z23 Encounter for immunization: Secondary | ICD-10-CM

## 2015-07-06 DIAGNOSIS — F419 Anxiety disorder, unspecified: Secondary | ICD-10-CM

## 2015-07-06 DIAGNOSIS — F329 Major depressive disorder, single episode, unspecified: Secondary | ICD-10-CM

## 2015-07-06 MED ORDER — LORAZEPAM 0.5 MG PO TABS: 0.5000 mg | ORAL_TABLET | Freq: Every day | ORAL | Status: DC | PRN

## 2015-07-06 NOTE — Assessment & Plan Note (Addendum)
TSH normal last visit. Her weight is down though. I have reminded pt to eat regular well balanced meals and snacks and call us if she has any further weight loss as we may have to consider d/c wellbutrin. She verbalizes understanding.

## 2015-07-06 NOTE — Progress Notes (Signed)
Pre visit review using our clinic review tool, if applicable. No additional management support is needed unless otherwise documented below in the visit note. 

## 2015-07-06 NOTE — Patient Instructions (Signed)
Please go to the lab for urine drug screen. Weigh yourself weekly. Let us know if you have any further weight loss.

## 2015-07-06 NOTE — Progress Notes (Signed)
   Subjective:    Patient ID: Julie Webb, female    DOB: 1990-12-28, 24 y.o.   MRN: [id number]  HPI  Julie Webb presents for a 1 month follow up of her anxiety/depression.  Last visit we intitiated prozac.  She then started the wellbutrin a few weeks later. This was her previous regimen. Tolerating prozac.  Only side effect is dry mouth.  Reports that anxiety is ok- has symptoms about 2 x a week.   Wt Readings from Last 3 Encounters:  07/06/15 120 lb (54.432 kg)  05/30/15 124 lb 9.6 oz (56.518 kg)  05/29/15 123 lb (55.792 kg)   She reports that her weight had been as high as 135 at one time.  Reports that she continues to eat regularly  Review of Systems See HPI  Past Medical History  Diagnosis Date  . Anxiety   . Depression   . Glomerulonephritis, poststreptococcal     age 65    Social History   Social History  . Marital Status: Single    Spouse Name: N/A  . Number of Children: N/A  . Years of Education: N/A   Occupational History  . Not on file.   Social History Main Topics  . Smoking status: Never Smoker   . Smokeless tobacco: Not on file  . Alcohol Use: Yes  . Drug Use: No  . Sexual Activity: Yes    Birth Control/ Protection: None   Other Topics Concern  . 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  Family History  Problem Relation Age of Onset  . Hyperlipidemia Mother   . Mental illness Mother   . Hypertension Father   . Heart disease Maternal Grandmother   . Hyperlipidemia Maternal Grandmother   . Hypertension Maternal Grandmother   . Heart disease Maternal Grandfather   . Hyperlipidemia Maternal Grandfather   . Hypertension Maternal Grandfather     No Known Allergies  Current Outpatient Prescriptions on File Prior to Visit  Medication Sig Dispense Refill  . FLUoxetine (PROZAC) 10  MG tablet Take 3 tablets (30 mg total) by mouth daily. 30 tablet 0  . norethindrone-ethinyl estradiol-iron (MICROGESTIN FE 1.5/30) 1.5-30 MG-MCG tablet Take 1 tablet by mouth daily. 1 Package 11  . SUMAtriptan (IMITREX) 50 MG tablet Take 1 tablet (50 mg total) by mouth every 2 (two) hours as needed for migraine. May repeat in 2 hours if headache persists or recurs. 10 tablet 2   No current facility-administered medications on file prior to visit.    BP 100/72 mmHg  Pulse 83  Temp(Src) 98.4 F (36.9 C) (Oral)  Resp 16  Ht 5\' 4"  (1.626 m)  Wt 120 lb (54.432 kg)  BMI 20.59 kg/m2  SpO2 99%  LMP 06/22/2015       Objective:   Physical Exam  Constitutional: She is oriented to person, place, and time. She appears well-developed and well-nourished. No distress.  Neurological: She is alert and oriented to person, place, and time.  Psychiatric: She has a normal mood and affect. Her behavior is normal. Judgment and thought content normal.  More interactive/cheerful today          Assessment & Plan:

## 2015-07-06 NOTE — Assessment & Plan Note (Addendum)
Improved on prozac and wellbutrin. Continue same. Refill for ativan provided and a controlled substance contract is signed.

## 2015-07-18 NOTE — Telephone Encounter (Signed)
No

## 2015-07-18 NOTE — Telephone Encounter (Signed)
Pt was no show 07/03/15 11:00am, follow up appt, pt came in 07/06/15, charge for no show?

## 2015-08-14 ENCOUNTER — Telehealth: Payer: Self-pay | Admitting: Family

## 2015-08-14 MED ORDER — FLUOXETINE HCL 10 MG PO TABS
30.0000 mg | ORAL_TABLET | Freq: Every day | ORAL | Status: DC
Start: 2015-08-14 — End: 2015-11-21

## 2015-08-14 NOTE — Telephone Encounter (Signed)
Pharmacy: [facility] [place], [place] - [address]  Reason for call: Pt needing refill on Prozac. She said she is out. She states last time she had her psychiatrist fill but normally Melissa fills it. Pt asking it to be sent to [facility] on [address]. Please send today.

## 2015-08-14 NOTE — Telephone Encounter (Signed)
Refills sent, notified pt. 

## 2015-08-28 ENCOUNTER — Telehealth: Payer: Self-pay | Admitting: Family

## 2015-08-28 NOTE — Telephone Encounter (Signed)
Caller name: Self   Can be reached: [phone]   Pharmacy: [facility] [address], [place] - [address] [phone] (Phone) [phone] (Fax)         Reason for call: Need refill on Birth Control pills

## 2015-08-29 MED ORDER — NORETHIN ACE-ETH ESTRAD-FE 1.5-30 MG-MCG PO TABS: 1.0000 | ORAL_TABLET | Freq: Every day | ORAL | Status: DC

## 2015-08-29 NOTE — Telephone Encounter (Signed)
Cancelled remaining refills on original Rx from 05/2015 with Edwin Dada at [facility].

## 2015-08-29 NOTE — Telephone Encounter (Signed)
Refills sent, notified pt. 

## 2015-10-05 ENCOUNTER — Encounter: Payer: Self-pay | Admitting: Family

## 2015-10-05 ENCOUNTER — Ambulatory Visit (INDEPENDENT_AMBULATORY_CARE_PROVIDER_SITE_OTHER): Payer: 59 | Admitting: Family

## 2015-10-05 VITALS — BP 112/74 | HR 77 | Temp 98.6°F | Resp 18 | Ht 64.0 in | Wt 120.0 lb

## 2015-10-05 DIAGNOSIS — M2550 Pain in unspecified joint: Secondary | ICD-10-CM | POA: Diagnosis not present

## 2015-10-05 DIAGNOSIS — G250 Essential tremor: Secondary | ICD-10-CM | POA: Insufficient documentation

## 2015-10-05 DIAGNOSIS — F419 Anxiety disorder, unspecified: Secondary | ICD-10-CM

## 2015-10-05 DIAGNOSIS — R5382 Chronic fatigue, unspecified: Secondary | ICD-10-CM

## 2015-10-05 DIAGNOSIS — R251 Tremor, unspecified: Secondary | ICD-10-CM | POA: Diagnosis not present

## 2015-10-05 DIAGNOSIS — F418 Other specified anxiety disorders: Secondary | ICD-10-CM

## 2015-10-05 DIAGNOSIS — R634 Abnormal weight loss: Secondary | ICD-10-CM

## 2015-10-05 DIAGNOSIS — F329 Major depressive disorder, single episode, unspecified: Secondary | ICD-10-CM

## 2015-10-05 LAB — RHEUMATOID FACTOR

## 2015-10-05 LAB — MONONUCLEOSIS SCREEN: MONO SCREEN: NEGATIVE

## 2015-10-05 LAB — SEDIMENTATION RATE: SED RATE: 14 mm/h (ref 0–22)

## 2015-10-05 NOTE — Assessment & Plan Note (Signed)
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 Suspect wellbutrin  has worsened her tremor. Advised her to discuss with her psychiatrist.

## 2015-10-05 NOTE — Patient Instructions (Signed)
Please complete lab work prior to leaving. Discuss your tremor and the wellbutrin with your psychiatry.

## 2015-10-05 NOTE — Assessment & Plan Note (Signed)
Stable

## 2015-10-05 NOTE — Progress Notes (Signed)
Pre visit review using our clinic review tool, if applicable. No additional management support is needed unless otherwise documented below in the visit note. 

## 2015-10-05 NOTE — Progress Notes (Signed)
Subjective:    Patient ID: Julie Webb, female    DOB: 1991/05/19, 24 y.o.   MRN: [id number]  HPI   Anxiety/depression-  Currently on prozac, wellbutrin and ativan. She is currently following at [facility]. Following with Dr Milana Huntsman.  Worsening tremor.   Weight loss- appetite is fair.  Weight stable.  Wt Readings from Last 3 Encounters:  10/05/15 120 lb (54.432 kg)  07/06/15 120 lb (54.432 kg)  05/30/15 124 lb 9.6 oz (56.518 kg)   Fatigue- has "gotten worse."  She reports feeling tired all the time.  Works 3 nights.   + joint  Pain.  Wants to be tested for lupus.  Review of Systems See HPI  Past Medical History  Diagnosis Date  . Anxiety   . Depression   . Glomerulonephritis, poststreptococcal     age 24    Social History   Social History  . Marital Status: Single    Spouse Name: N/A  . Number of Children: N/A  . Years of Education: N/A   Occupational History  . Not on file.   Social History Main Topics  . Smoking status: Never Smoker   . Smokeless tobacco: Not on file  . Alcohol Use: Yes  . Drug Use: No  . Sexual Activity: Yes    Birth Control/ Protection: None   Other Topics Concern  . 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  Family History  Problem Relation Age of Onset  . Hyperlipidemia Mother   . Mental illness Mother   . Hypertension Father   . Heart disease Maternal Grandmother   . Hyperlipidemia Maternal Grandmother   . Hypertension Maternal Grandmother   . Heart disease Maternal Grandfather   . Hyperlipidemia Maternal Grandfather   . Hypertension Maternal Grandfather     No Known Allergies  Current Outpatient Prescriptions on File Prior to Visit  Medication Sig Dispense Refill  . buPROPion (WELLBUTRIN XL) 150 MG 24 hr tablet Take 150 mg by mouth daily.    Marland Kitchen  FLUoxetine (PROZAC) 10 MG tablet Take 3 tablets (30 mg total) by mouth daily. 90 tablet 2  . LORazepam (ATIVAN) 0.5 MG tablet Take 1 tablet (0.5 mg total) by mouth daily as needed for anxiety. 30 tablet 0  . norethindrone-ethinyl estradiol-iron (MICROGESTIN FE 1.5/30) 1.5-30 MG-MCG tablet Take 1 tablet by mouth daily. 1 Package 11  . SUMAtriptan (IMITREX) 50 MG tablet Take 1 tablet (50 mg total) by mouth every 2 (two) hours as needed for migraine. May repeat in 2 hours if headache persists or recurs. 10 tablet 2   No current facility-administered medications on file prior to visit.    BP 112/74 mmHg  Pulse 77  Temp(Src) 98.6 F (37 C) (Oral)  Resp 18  Ht 5' 4"  (1.626 m)  Wt 120 lb (54.432 kg)  BMI 20.59 kg/m2  SpO2 100%  LMP 09/28/2015       Objective:   Physical Exam  Constitutional: She appears well-developed and well-nourished.  Cardiovascular: Normal rate, regular rhythm and normal heart sounds.   No murmur heard. Pulmonary/Chest: Effort normal and breath sounds normal. No respiratory distress. She has no wheezes.  Neurological:  Fine resting tremor  Psychiatric: Her behavior  is normal. Judgment and thought content normal.  Flat affect, mildly anxious appearing          Assessment & Plan:  Joint pain/fatigue- suspect fatigue is multifactorial (depression and shift work). Will obtain ANA, RA, ESR to evaluate for underlying autoimmune process.

## 2015-10-05 NOTE — Assessment & Plan Note (Signed)
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 Suspect wellbutrin has worsened her tremor. Advised her to discuss with her psychiatrist.

## 2015-10-05 NOTE — Assessment & Plan Note (Signed)
Fair control, advised pt to continue current meds and continue follow up with her psychiatrist.

## 2015-10-08 ENCOUNTER — Encounter: Payer: Self-pay | Admitting: Family

## 2015-10-08 LAB — ANA: Anti Nuclear Antibody(ANA): NEGATIVE

## 2015-10-19 ENCOUNTER — Telehealth: Payer: Self-pay | Admitting: *Deleted

## 2015-10-19 NOTE — Telephone Encounter (Signed)
QL PA submitted, awaiting determination. JG//CMA

## 2015-10-23 NOTE — Telephone Encounter (Signed)
Received fax from [facility] RE: Approval for patient's Fluoxetine 10 mg, valid 10/19/15 thru 11/19/2015 only, paper work states the approval will expire on this date 11/19/15 and we will need to acquire another PA at that time; faxed to pharmacy/SLS

## 2015-11-21 ENCOUNTER — Other Ambulatory Visit: Payer: Self-pay | Admitting: Family

## 2016-04-07 ENCOUNTER — Ambulatory Visit (INDEPENDENT_AMBULATORY_CARE_PROVIDER_SITE_OTHER): Payer: 59 | Admitting: Family

## 2016-04-07 ENCOUNTER — Encounter: Payer: Self-pay | Admitting: Family

## 2016-04-07 VITALS — BP 105/71 | HR 72 | Temp 98.4°F | Resp 18 | Ht 64.0 in | Wt 121.8 lb

## 2016-04-07 DIAGNOSIS — R251 Tremor, unspecified: Secondary | ICD-10-CM | POA: Diagnosis not present

## 2016-04-07 DIAGNOSIS — Z79899 Other long term (current) drug therapy: Secondary | ICD-10-CM | POA: Diagnosis not present

## 2016-04-07 DIAGNOSIS — F32A Depression, unspecified: Secondary | ICD-10-CM

## 2016-04-07 DIAGNOSIS — F329 Major depressive disorder, single episode, unspecified: Secondary | ICD-10-CM

## 2016-04-07 DIAGNOSIS — F418 Other specified anxiety disorders: Secondary | ICD-10-CM

## 2016-04-07 DIAGNOSIS — G43809 Other migraine, not intractable, without status migrainosus: Secondary | ICD-10-CM

## 2016-04-07 DIAGNOSIS — F419 Anxiety disorder, unspecified: Secondary | ICD-10-CM

## 2016-04-07 MED ORDER — SUMATRIPTAN SUCCINATE 50 MG PO TABS: 50.0000 mg | ORAL_TABLET | ORAL | Status: DC | PRN

## 2016-04-07 MED ORDER — FLUOXETINE HCL 10 MG PO TABS: ORAL_TABLET | ORAL | Status: DC

## 2016-04-07 MED ORDER — LORAZEPAM 0.5 MG PO TABS: 0.5000 mg | ORAL_TABLET | Freq: Every day | ORAL | Status: DC | PRN

## 2016-04-07 MED FILL — FLUoxetine HCL 10 MG TABS: 10 | 10 days supply | Qty: 30 | Fill #0

## 2016-04-07 MED FILL — LORazepam 0.5 MG TABS: 0.5 | 30 days supply | Qty: 30 | Fill #0

## 2016-04-07 MED FILL — SUMATRIPTAN SUCC 50 MG TAB: 50 | 30 days supply | Qty: 9 | Fill #0

## 2016-04-07 NOTE — Patient Instructions (Signed)
Please restart prozac once daily.

## 2016-04-07 NOTE — Progress Notes (Signed)
Pre visit review using our clinic review tool, if applicable. No additional management support is needed unless otherwise documented below in the visit note. 

## 2016-04-07 NOTE — Assessment & Plan Note (Signed)
>>  ASSESSMENT AND PLAN FOR MIGRAINE WRITTEN ON 04/07/2016 10:35 AM BY O'SULLIVAN, Sarthak Rubenstein, NP  Controlled with prn imitrex , rx sent.

## 2016-04-07 NOTE — Assessment & Plan Note (Signed)
Controlled with prn imitrex, rx sent.

## 2016-04-07 NOTE — Assessment & Plan Note (Signed)
Depression is controlled, mainly having anxiety.  Will restart prozac.  Since her tremor has improved off of wellbutrin and depression is stable, will not restart wellbutrin at this time.

## 2016-04-07 NOTE — Assessment & Plan Note (Signed)
Improved off of wellbutrin.

## 2016-04-07 NOTE — Assessment & Plan Note (Signed)
>>  ASSESSMENT AND PLAN FOR TREMOR WRITTEN ON 04/07/2016 10:35 AM BY O'SULLIVAN, Darsha Zumstein, NP  Improved off of wellbutrin .

## 2016-04-07 NOTE — Progress Notes (Signed)
Subjective:    Patient ID: Julie Webb, female    DOB: 1991/05/11, 25 y.o.   MRN: [id number]  HPI  Julie Webb is a 25 yr old female with hx of anxiety/depression who presents today for follow up. She was last seen on 10/05/15 and at that time was following with [facility] (Dr. Milana Huntsman).   Previously on prozac and wellbutrin. Anxiety has worsened, depression has been better. She uses ativan about every other day.  Reports that she did not like the psychiatrist and cost was an issue so she has not been back. Ran out of medication 1 month ago.   She reported joint pain last visit, ANA/RA/ESR were obtained to evaluate for underlying autoimmune etiology- these tests were all normal. Reports pain is still there on occasion but is better.   Tremor- Mom has hx of benign tremor.  Has mild tremor, was worse when she was on wellbutrin, has been off for 1 month.    Migraines- reports that they occur every once in a while, less frequent. Uses imitrex prn and this helps.   Review of Systems    see HPI  Past Medical History  Diagnosis Date  . Anxiety   . Depression   . Glomerulonephritis, poststreptococcal     age 36     Social History   Social History  . Marital Status: Single    Spouse Name: N/A  . Number of Children: N/A  . Years of Education: N/A   Occupational History  . Not on file.   Social History Main Topics  . Smoking status: Never Smoker   . Smokeless tobacco: Not on file  . Alcohol Use: Yes  . Drug Use: No  . Sexual Activity: Yes    Birth Control/ Protection: None   Other Topics Concern  . 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  Family History  Problem Relation Age of Onset  . Hyperlipidemia Mother   . Mental illness Mother   . Hypertension Father   . Heart disease Maternal  Grandmother   . Hyperlipidemia Maternal Grandmother   . Hypertension Maternal Grandmother   . Heart disease Maternal Grandfather   . Hyperlipidemia Maternal Grandfather   . Hypertension Maternal Grandfather     Allergies  Allergen Reactions  . Ibuprofen Other (See Comments)    Kidney failure    Current Outpatient Prescriptions on File Prior to Visit  Medication Sig Dispense Refill  . buPROPion (WELLBUTRIN XL) 150 MG 24 hr tablet Take 150 mg by mouth daily.    . norethindrone-ethinyl estradiol-iron (MICROGESTIN FE 1.5/30) 1.5-30 MG-MCG tablet Take 1 tablet by mouth daily. 1 Package 11   No current facility-administered medications on file prior to visit.    BP 105/71 mmHg  Pulse 72  Temp(Src) 98.4 F (36.9 C) (Oral)  Resp 18  Ht 5' 4"  (1.626 m)  Wt 121 lb 12.8 oz (55.248 kg)  BMI 20.90 kg/m2  SpO2 100%  LMP 04/02/2016    Objective:   Physical Exam  Constitutional: She appears well-developed and well-nourished.  Cardiovascular: Normal rate, regular rhythm and normal heart sounds.   No murmur heard. Pulmonary/Chest: Effort normal and breath sounds normal. No respiratory distress. She has no  wheezes.  Psychiatric: She has a normal mood and affect. Her behavior is normal. Judgment and thought content normal.          Assessment & Plan:

## 2016-04-29 ENCOUNTER — Encounter: Payer: Self-pay | Admitting: Family

## 2016-05-08 MED FILL — FLUoxetine HCL 10 MG TABS: 10 | 10 days supply | Qty: 30 | Fill #1 | Status: TO

## 2016-05-19 ENCOUNTER — Telehealth: Payer: Self-pay | Admitting: *Deleted

## 2016-05-19 ENCOUNTER — Ambulatory Visit: Payer: 59 | Admitting: Family

## 2016-05-19 NOTE — Telephone Encounter (Signed)
Please see below message and advise if no show / late fee should be applied?

## 2016-05-19 NOTE — Telephone Encounter (Signed)
-----   Message from One[place] Alar, [place] sent at 05/19/2016  8:52 AM EDT ----- Patient called at 8:50 for 8:45 appointment stated she was going to be about 10 more minutes late asked to reschedule

## 2016-05-19 NOTE — Telephone Encounter (Signed)
No charge. 

## 2016-05-20 ENCOUNTER — Telehealth: Payer: Self-pay | Admitting: Family

## 2016-05-20 ENCOUNTER — Ambulatory Visit: Payer: 59 | Admitting: Family

## 2016-05-20 MED FILL — FLUoxetine HCL 10 MG TABS: 10 | 10 days supply | Qty: 30 | Fill #0

## 2016-05-20 NOTE — Telephone Encounter (Signed)
Noted. Do not charge no show.

## 2016-05-20 NOTE — Telephone Encounter (Signed)
Pt called in because she says that she is having car trouble and will not be able to make her appt.    Pt says that she is very sorry and will call us back to reschedule when she's sure of when she can make appt .

## 2016-05-21 NOTE — Telephone Encounter (Signed)
No charge. 

## 2016-05-21 NOTE — Telephone Encounter (Signed)
Pt was no show 05/20/16 for f/u appt, 2nd no show w/in 12 months & pt was late 05/19/16 and was not charged, charge or no charge for 05/20/16?

## 2016-06-03 ENCOUNTER — Encounter (HOSPITAL_BASED_OUTPATIENT_CLINIC_OR_DEPARTMENT_OTHER): Payer: Self-pay | Admitting: *Deleted

## 2016-06-03 ENCOUNTER — Emergency Department (HOSPITAL_BASED_OUTPATIENT_CLINIC_OR_DEPARTMENT_OTHER)
Admission: EM | Admit: 2016-06-03 | Discharge: 2016-06-04 | Disposition: A | Discharge status: Other Acute Inpatient Hospital | Payer: 59 | Attending: Emergency Medicine | Admitting: Emergency Medicine

## 2016-06-03 ENCOUNTER — Telehealth: Payer: Self-pay | Admitting: Family

## 2016-06-03 DIAGNOSIS — R51 Headache: Secondary | ICD-10-CM | POA: Diagnosis not present

## 2016-06-03 DIAGNOSIS — Z79899 Other long term (current) drug therapy: Secondary | ICD-10-CM | POA: Insufficient documentation

## 2016-06-03 DIAGNOSIS — F419 Anxiety disorder, unspecified: Secondary | ICD-10-CM

## 2016-06-03 DIAGNOSIS — R42 Dizziness and giddiness: Secondary | ICD-10-CM | POA: Insufficient documentation

## 2016-06-03 DIAGNOSIS — F329 Major depressive disorder, single episode, unspecified: Secondary | ICD-10-CM | POA: Insufficient documentation

## 2016-06-03 DIAGNOSIS — F32A Depression, unspecified: Secondary | ICD-10-CM

## 2016-06-03 DIAGNOSIS — R45851 Suicidal ideations: Secondary | ICD-10-CM | POA: Diagnosis not present

## 2016-06-03 DIAGNOSIS — R4589 Other symptoms and signs involving emotional state: Secondary | ICD-10-CM

## 2016-06-03 DIAGNOSIS — F410 Panic disorder [episodic paroxysmal anxiety] without agoraphobia: Secondary | ICD-10-CM | POA: Diagnosis present

## 2016-06-03 HISTORY — DX: Migraine, unspecified, not intractable, without status migrainosus: G43.909

## 2016-06-03 LAB — URINE MICROSCOPIC-ADD ON

## 2016-06-03 LAB — URINALYSIS, ROUTINE W REFLEX MICROSCOPIC
BILIRUBIN URINE: NEGATIVE
GLUCOSE, UA: NEGATIVE mg/dL
Hgb urine dipstick: NEGATIVE
Ketones, ur: NEGATIVE mg/dL
NITRITE: NEGATIVE
Protein, ur: NEGATIVE mg/dL
Specific Gravity, Urine: 1.023 (ref 1.005–1.030)
pH: 7 (ref 5.0–8.0)

## 2016-06-03 LAB — PREGNANCY, URINE: Preg Test, Ur: NEGATIVE

## 2016-06-03 MED ORDER — LORAZEPAM 2 MG/ML IJ SOLN
0.5000 mg | Freq: Once | INTRAMUSCULAR | Status: AC
  Administered 2016-06-04: 0.5 mg via INTRAVENOUS
  Filled 2016-06-03: qty 1

## 2016-06-03 MED ORDER — PROCHLORPERAZINE EDISYLATE 5 MG/ML IJ SOLN
10.0000 mg | Freq: Once | INTRAMUSCULAR | Status: AC
  Administered 2016-06-03: 10 mg via INTRAVENOUS
  Filled 2016-06-03: qty 2

## 2016-06-03 MED ORDER — METOCLOPRAMIDE HCL 5 MG/ML IJ SOLN
10.0000 mg | Freq: Once | INTRAMUSCULAR | Status: AC
  Administered 2016-06-03: 10 mg via INTRAVENOUS
  Filled 2016-06-03: qty 2

## 2016-06-03 MED ORDER — ONDANSETRON 4 MG PO TBDP
4.0000 mg | ORAL_TABLET | Freq: Once | ORAL | Status: AC
  Administered 2016-06-03: 4 mg via ORAL
  Filled 2016-06-03: qty 1

## 2016-06-03 MED ORDER — SODIUM CHLORIDE 0.9 % IV BOLUS (SEPSIS)
500.0000 mL | Freq: Once | INTRAVENOUS | Status: AC
  Administered 2016-06-03: 500 mL via INTRAVENOUS

## 2016-06-03 MED ORDER — SODIUM CHLORIDE 0.9 % IV SOLN
Freq: Once | INTRAVENOUS | Status: AC
  Administered 2016-06-04: 01:00:00 via INTRAVENOUS

## 2016-06-03 MED ORDER — LORAZEPAM 0.5 MG PO TABS: 0.5000 mg | ORAL_TABLET | Freq: Every day | ORAL | 0 refills | Status: DC | PRN

## 2016-06-03 MED ORDER — ACETAMINOPHEN 500 MG PO TABS
1000.0000 mg | ORAL_TABLET | Freq: Once | ORAL | Status: AC
  Administered 2016-06-03: 1000 mg via ORAL
  Filled 2016-06-03: qty 2

## 2016-06-03 MED FILL — LORazepam 0.5 MG TABS: 0.5 | 30 days supply | Qty: 30 | Fill #0

## 2016-06-03 NOTE — ED Triage Notes (Signed)
Panic attacks and passing out since noon today.

## 2016-06-03 NOTE — ED Provider Notes (Signed)
[facility] DEPT [facility] Provider Note   CSN: [id number] Arrival date & time: 06/03/16  1930  By signing my name below, I, Julie Webb, attest that this documentation has been prepared under the direction and in the presence of non-physician practitioner, Arlean Hopping, PA-C. Electronically Signed: Jeanell Webb, Scribe. 06/03/2016. 9:27 PM.  History   Chief Complaint Chief Complaint  Patient presents with  . Panic Attack   The history is provided by the patient and a significant other. No language interpreter was used.  HPI Comments: Julie Webb is a 25 y.o. female with a hx of HI who presents to the Emergency Department complaining of panic attacks that occurred today. Boyfriend reports that pt had two panic attacks today, then passed out while hyperventilating, but pt was lowered to the ground and did not hit her head. Pt states that she currently has a constant moderate frontal headache with dizziness and nausea. Boyfriend reports that pt has been under a high amount of stress from an upcoming wedding, recent death of father, and financial problems. Pt states that she usually takes Lorazepam for her panic attack episodes, but did not have any relief today. She reports that she has been on Prozac for the past 2-3 years and states that this was helping her anxiety and her depression until a couple weeks ago. Patient admits to having thoughts of hurting herself. Denies homicidal ideations or A/V hallucinations. Denies suicidal plan. Denies illicit drug use or recent alcohol use. 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 She denies any abdominal pain, shortness of breath, chest pain, recent illness, or any other complaints.    LNMP: last week  PCP: Nance Pear., NP  Past Medical History:  Diagnosis Date  . Anxiety   . Depression   . Glomerulonephritis, poststreptococcal    age 47  . Migraines     Patient Active Problem List   Diagnosis Date Noted  . Tremor 10/05/2015  . Loss of weight 07/06/2015  . Anxiety and depression 05/30/2015  . H/O acute post-streptococcal glomerulonephritis 05/30/2015  . Contraceptive management 05/30/2015  . Migraine 05/30/2015    History reviewed. No pertinent surgical history.  OB History    No data available       Home Medications    Prior to Admission medications   Medication Sig Start Date Webb Date Taking? Authorizing Provider  FLUoxetine (PROZAC) 10 MG tablet TAKE THREE TABLETS BY MOUTH ONCE DAILY 04/07/16   Debbrah Alar, NP  LORazepam (ATIVAN) 0.5 MG tablet Take 1 tablet (0.5 mg total) by mouth daily as needed for anxiety. 06/03/16   Brunetta Jeans, PA-C  norethindrone-ethinyl estradiol-iron (MICROGESTIN FE 1.5/30) 1.5-30 MG-MCG tablet Take 1 tablet by mouth daily. 08/29/15   Debbrah Alar, NP  SUMAtriptan (IMITREX) 50 MG tablet Take 1 tablet (50 mg total) by mouth every 2 (two) hours as needed for migraine. May repeat in 2 hours if headache persists or recurs. (max 2 tabs/24hrs) 04/07/16   Debbrah Alar, NP    Family History Family History  Problem Relation Age of Onset  . Hyperlipidemia Mother   . Mental illness Mother   . Hypertension Father   . Heart disease Maternal Grandmother   . Hyperlipidemia Maternal Grandmother   . Hypertension Maternal Grandmother   . Heart disease Maternal Grandfather   . Hyperlipidemia Maternal Grandfather   . Hypertension Maternal Grandfather     Social History Social History  Substance Use Topics  .  Smoking status: Never Smoker  . Smokeless tobacco: Never Used  . Alcohol use Yes     Allergies   Ibuprofen   Review of Systems Review of Systems  Constitutional: Positive for fatigue.  Respiratory: Negative for shortness of breath.   Gastrointestinal: Positive for nausea.  Genitourinary: Negative for dysuria.  Neurological: Positive for dizziness, syncope and headaches (frontal).  Psychiatric/Behavioral:  Positive for decreased concentration, dysphoric mood and suicidal ideas. Negative for hallucinations, self-injury and sleep disturbance. The patient is nervous/anxious.   All other systems reviewed and are negative.    Physical Exam Updated Vital Signs BP 104/72 (BP Location: Right Arm)   Pulse 74   Temp 98.3 F (36.8 C) (Oral)   Resp (!) 30   Ht 5\' 4"  (1.626 m)   Wt 120 lb (54.4 kg)   LMP 05/27/2016   SpO2 100%   BMI 20.60 kg/m   Physical Exam  Constitutional: She is oriented to person, place, and time. She appears well-developed and well-nourished. No distress.  HENT:  Head: Normocephalic and atraumatic.  Eyes: Conjunctivae and EOM are normal. Pupils are equal, round, and reactive to light.  Neck: Normal range of motion. Neck supple.  Cardiovascular: Normal rate, regular rhythm, normal heart sounds and intact distal pulses.   Pulmonary/Chest: Effort normal and breath sounds normal. No respiratory distress.  Abdominal: Soft. There is no tenderness. There is no guarding.  Musculoskeletal:  Moves all extremities.  Lymphadenopathy:    She has no cervical adenopathy.  Neurological: She is alert and oriented to person, place, and time. She has normal reflexes.  No sensory deficits. Strength 5/5 in all extremities. No gait disturbance. Coordination intact. Cranial nerves III-XII grossly intact.   Skin: Skin is warm and dry. She is not diaphoretic.  Psychiatric: She has a normal mood and affect. Her behavior is normal.  Nursing note and vitals reviewed.    ED Treatments / Results  DIAGNOSTIC STUDIES: Oxygen Saturation is 100% on RA, normal by my interpretation.    COORDINATION OF CARE: 9:31 PM- Pt and pt's boyfriend advised of plan for treatment, which includes medication, and they both agree.  Labs (all labs ordered are listed, but only abnormal results are displayed) Labs Reviewed  URINALYSIS, ROUTINE W REFLEX MICROSCOPIC (NOT AT [facility]) - Abnormal; Notable for the  following:       Result Value   APPearance CLOUDY (*)    Leukocytes, UA MODERATE (*)    All other components within normal limits  URINE MICROSCOPIC-ADD ON - Abnormal; Notable for the following:    Squamous Epithelial / LPF 0-5 (*)    Bacteria, UA FEW (*)    All other components within normal limits  PREGNANCY, URINE  COMPREHENSIVE METABOLIC PANEL  ETHANOL  CBC WITH DIFFERENTIAL/PLATELET  URINE RAPID DRUG SCREEN, HOSP PERFORMED    EKG  EKG Interpretation None       Radiology No results found.  Procedures Procedures (including critical care time)  Medications Ordered in ED Medications  LORazepam (ATIVAN) tablet 1 mg (not administered)  zolpidem (AMBIEN) tablet 5 mg (not administered)  acetaminophen (TYLENOL) tablet 650 mg (not administered)  alum & mag hydroxide-simeth (MAALOX/MYLANTA) 200-200-20 MG/5ML suspension 30 mL (not administered)  ondansetron (ZOFRAN) tablet 4 mg (not administered)  norethindrone-ethinyl estradiol-iron (MICROGESTIN FE,GILDESS FE,LOESTRIN FE) 1.5-30 MG-MCG tablet 1 tablet (not administered)  SUMAtriptan (IMITREX) tablet 50 mg (not administered)  FLUoxetine (PROZAC) tablet 30 mg (not administered)  acetaminophen (TYLENOL) tablet 1,000 mg (1,000 mg Oral Given 06/03/16 2148)  ondansetron (ZOFRAN-ODT) disintegrating tablet 4 mg (4 mg Oral Given 06/03/16 2149)  sodium chloride 0.9 % bolus 500 mL (0 mLs Intravenous Stopped 06/04/16 0042)  metoCLOPramide (REGLAN) injection 10 mg (10 mg Intravenous Given 06/03/16 2314)  prochlorperazine (COMPAZINE) injection 10 mg (10 mg Intravenous Given 06/03/16 2314)  LORazepam (ATIVAN) injection 0.5 mg (0.5 mg Intravenous Given 06/04/16 0043)  0.9 %  sodium chloride infusion ( Intravenous New Bag/Given 06/04/16 0045)     Initial Impression / Assessment and Plan / ED Course  I have reviewed the triage vital signs and the nursing notes.  Pertinent labs & imaging results that were available during my care of the patient  were reviewed by me and considered in my medical decision making (see chart for details).  Clinical Course    Julie Webb presents with increased anxiety and depression over the last couple weeks, worse today.  Patient endorses increased anxiety, depression, and thoughts of self-harm. TTS consult indicated. Recommendation for psychiatry evaluation in the morning.  1:05 AM Spoke with Dr. Roxanne Mins, EDP at [facility], who agreed to accept the patient for a psych hold. Elvina Sidle charge nurse, Jonelle Sidle, also updated. Patient awaiting transport. Placed in psych hold. Home medications ordered.  Vitals:   06/03/16 1933 06/03/16 1934 06/03/16 2037 06/03/16 2250  BP:  136/79 104/72 108/76  Pulse:  96 74 73  Resp:  (!) 30  16  Temp:  98.3 F (36.8 C)    TempSrc:  Oral    SpO2:  100% 100% 98%  Weight: 120 lb (54.4 kg)     Height: 5\' 4"  (1.626 m)      Vitals:   06/03/16 1933 06/03/16 1934 06/03/16 2037 06/03/16 2250  BP:  136/79 104/72 108/76  Pulse:  96 74 73  Resp:  (!) 30  16  Temp:  98.3 F (36.8 C)    TempSrc:  Oral    SpO2:  100% 100% 98%  Weight: 54.4 kg     Height: 5\' 4"  (1.626 m)        Final Clinical Impressions(s) / ED Diagnoses   Final diagnoses:  Depression  Anxiety  Thoughts of self harm    New Prescriptions New Prescriptions   No medications on file   I personally performed the services described in this documentation, which was scribed in my presence. The recorded information has been reviewed and is accurate.    Lorayne Bender, PA-C 06/04/16 0118    Julianne Rice, MD 06/07/16 [phone]

## 2016-06-03 NOTE — Telephone Encounter (Signed)
Refill request for Ativan [and Fluoxetine] Last filled by MD on - 04/07/16, #30x0 Last AEX - 04/07/16 Next AEX - 6-wks [Fluoxetine sent 04/07/16 #30x0 with Sig: Take [3] tabs PO once daily]; Please Advise/SLS 08/15 Please Advise on Ativan refills/SLS 08/15

## 2016-06-03 NOTE — ED Notes (Signed)
TTS called back and said that the Psychiatrist wanted to speak with Pt. In the morning.  Pt. Made aware of this.  Charge RN aware.

## 2016-06-03 NOTE — Telephone Encounter (Signed)
Rx request for Ativan phoned in to pharmacy/SLS 08/15  Will defer to {Melissa]/Tricia on the Fluoxetine.

## 2016-06-03 NOTE — ED Notes (Signed)
TTS speaking with Pt. At present time.

## 2016-06-03 NOTE — Telephone Encounter (Signed)
°  Relationship to patient: Self Can be reached: [phone]    Reason for call: Request refill on Prozac and Ativan
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
## 2016-06-03 NOTE — Telephone Encounter (Signed)
Would need to contact patient to assess how she is taking the Fluoxetine. Medication was restarted at last visit and written to take 3 tablets (30 mg) daily. Only 30 tablets sent. Please verify with patient and we will be able to send in refill. Ok to give 1 month refill of Ativan under my name until she follows up with Melissa.

## 2016-06-04 DIAGNOSIS — Z79899 Other long term (current) drug therapy: Secondary | ICD-10-CM | POA: Diagnosis not present

## 2016-06-04 DIAGNOSIS — R42 Dizziness and giddiness: Secondary | ICD-10-CM | POA: Diagnosis not present

## 2016-06-04 DIAGNOSIS — F329 Major depressive disorder, single episode, unspecified: Secondary | ICD-10-CM | POA: Diagnosis not present

## 2016-06-04 DIAGNOSIS — F419 Anxiety disorder, unspecified: Secondary | ICD-10-CM | POA: Diagnosis not present

## 2016-06-04 DIAGNOSIS — R51 Headache: Secondary | ICD-10-CM | POA: Diagnosis not present

## 2016-06-04 DIAGNOSIS — R45851 Suicidal ideations: Secondary | ICD-10-CM | POA: Diagnosis not present

## 2016-06-04 LAB — COMPREHENSIVE METABOLIC PANEL
ALT: 10 U/L — ABNORMAL LOW (ref 14–54)
ANION GAP: 5 (ref 5–15)
AST: 15 U/L (ref 15–41)
Albumin: 4.3 g/dL (ref 3.5–5.0)
Alkaline Phosphatase: 41 U/L (ref 38–126)
BUN: 8 mg/dL (ref 6–20)
CALCIUM: 8.8 mg/dL — AB (ref 8.9–10.3)
CO2: 23 mmol/L (ref 22–32)
Chloride: 110 mmol/L (ref 101–111)
Creatinine, Ser: 0.62 mg/dL (ref 0.44–1.00)
GLUCOSE: 99 mg/dL (ref 65–99)
POTASSIUM: 3.7 mmol/L (ref 3.5–5.1)
SODIUM: 138 mmol/L (ref 135–145)
Total Bilirubin: 0.6 mg/dL (ref 0.3–1.2)
Total Protein: 6.9 g/dL (ref 6.5–8.1)

## 2016-06-04 LAB — RAPID URINE DRUG SCREEN, HOSP PERFORMED
AMPHETAMINES: NOT DETECTED
Barbiturates: NOT DETECTED
Benzodiazepines: NOT DETECTED
Cocaine: NOT DETECTED
Opiates: NOT DETECTED
TETRAHYDROCANNABINOL: NOT DETECTED

## 2016-06-04 LAB — CBC
HEMATOCRIT: 40.8 % (ref 36.0–46.0)
HEMOGLOBIN: 13.8 g/dL (ref 12.0–15.0)
MCH: 30.4 pg (ref 26.0–34.0)
MCHC: 33.8 g/dL (ref 30.0–36.0)
MCV: 89.9 fL (ref 78.0–100.0)
Platelets: 201 10*3/uL (ref 150–400)
RBC: 4.54 MIL/uL (ref 3.87–5.11)
RDW: 12.9 % (ref 11.5–15.5)
WBC: 7.5 10*3/uL (ref 4.0–10.5)

## 2016-06-04 LAB — ETHANOL: Alcohol, Ethyl (B): <5 mg/dL (ref <5)

## 2016-06-04 MED ORDER — FLUOXETINE HCL 20 MG PO CAPS: 30.0000 mg | ORAL_CAPSULE | Freq: Every day | ORAL | Status: DC

## 2016-06-04 MED ORDER — ZOLPIDEM TARTRATE 5 MG PO TABS
5.0000 mg | ORAL_TABLET | Freq: Every evening | ORAL | Status: DC | PRN
  Filled 2016-06-04: qty 1

## 2016-06-04 MED ORDER — NORETHIN ACE-ETH ESTRAD-FE 1.5-30 MG-MCG PO TABS: 1.0000 | ORAL_TABLET | Freq: Every day | ORAL | Status: DC

## 2016-06-04 MED ORDER — ONDANSETRON HCL 4 MG PO TABS: 4.0000 mg | ORAL_TABLET | Freq: Three times a day (TID) | ORAL | Status: DC | PRN

## 2016-06-04 MED ORDER — FLUOXETINE HCL 20 MG PO TABS
30.0000 mg | ORAL_TABLET | Freq: Every day | ORAL | Status: DC
  Filled 2016-06-04: qty 2

## 2016-06-04 MED ORDER — FLUOXETINE HCL 10 MG PO CAPS: 30.0000 mg | ORAL_CAPSULE | Freq: Every day | ORAL | 0 refills | Status: DC

## 2016-06-04 MED ORDER — ALUM & MAG HYDROXIDE-SIMETH 200-200-20 MG/5ML PO SUSP: 30.0000 mL | ORAL | Status: DC | PRN

## 2016-06-04 MED ORDER — LORAZEPAM 1 MG PO TABS: 1.0000 mg | ORAL_TABLET | Freq: Three times a day (TID) | ORAL | Status: DC | PRN

## 2016-06-04 MED ORDER — ACETAMINOPHEN 325 MG PO TABS: 650.0000 mg | ORAL_TABLET | ORAL | Status: DC | PRN

## 2016-06-04 MED ORDER — SUMATRIPTAN SUCCINATE 50 MG PO TABS
50.0000 mg | ORAL_TABLET | ORAL | Status: DC | PRN
  Filled 2016-06-04: qty 1

## 2016-06-04 MED FILL — FLUoxetine HCL 10 MG CAPS: 10 | 30 days supply | Qty: 90 | Fill #0

## 2016-06-04 NOTE — ED Notes (Signed)
She is drowsy and oriented x 4.  She recalls speaking with TTS a short time ago; and is pleased that she is to be discharged.  She denies any thoughts of harming herself; and her fiancee is at bedside.

## 2016-06-04 NOTE — Discharge Instructions (Signed)
For your ongoing behavioral health needs, you are advised to continue treatment at [facility].  Your are scheduled for an appointment with them on Friday, 06/06/2016 at 2:20 pm:       [facility]      [address]      [place], [place] [address]      [phone]

## 2016-06-04 NOTE — ED Notes (Signed)
Call to charge nurse Elvina Sidle ED bed not available at this time

## 2016-06-04 NOTE — Telephone Encounter (Signed)
Julie Webb-- spoke with pt. She states quantity of last Rx was only a 10 day supply with 2 refills and she refilled it every 10 days which gave her a 30 day supply. States she has been out of medication for 1 month. Is it ok, to restart back with 3 tablets a day or does she needs to titrate dose?

## 2016-06-04 NOTE — BH Assessment (Signed)
[facility] Assessment Progress Note  Per Hampton Abbot, MD, this pt does not require psychiatric hospitalization at this time.  Pt is to be discharged from [facility].  She currently receives outpatient treatment through [facility].  At the request of Waylan Boga, DNP, this writer called their office to ask for an appointment as soon as possible for the pt.  Call was placed to Denisa at 09:47.  She has scheduled pt for an appointment this coming Friday, 06/06/2016 at 14:20.  This has been included in pt's discharge instructions.  Pt's nurse has been notified.  Jalene Mullet, [facility] Triage Specialist [phone]

## 2016-06-04 NOTE — Telephone Encounter (Signed)
Notified pt and she voices understanding. 

## 2016-06-04 NOTE — ED Notes (Signed)
Spoke to [place] from lab, she reports that since [facility], lab orders did not carry over.  Requested to re-order them.

## 2016-06-04 NOTE — ED Notes (Signed)
Pt made request to become XXX.  Registration made aware.

## 2016-06-04 NOTE — Telephone Encounter (Signed)
Rx sent for 90 tabs. I see pt was in the ED for anxiety/depression. I would like her to keep her upcoming appointment with psychiatry on Friday.  I would like her psychiatrist to take over her refills moving forward.

## 2016-06-06 DIAGNOSIS — F3181 Bipolar II disorder: Secondary | ICD-10-CM | POA: Diagnosis not present

## 2016-06-06 MED FILL — LARIN FE 1.5-30 TABLET: 1.5-30 | 28 days supply | Qty: 28 | Fill #0

## 2016-06-06 MED FILL — MIRTAZAPINE 7.5 MG TABLET: 7.5 | 30 days supply | Qty: 30 | Fill #0

## 2016-06-10 ENCOUNTER — Other Ambulatory Visit: Payer: Self-pay

## 2016-06-10 MED ORDER — NORETHIN ACE-ETH ESTRAD-FE 1.5-30 MG-MCG PO TABS: 1.0000 | ORAL_TABLET | Freq: Every day | ORAL | 5 refills | Status: DC

## 2016-06-17 ENCOUNTER — Ambulatory Visit: Payer: 59 | Admitting: Family

## 2016-06-20 ENCOUNTER — Ambulatory Visit: Payer: 59 | Admitting: Family

## 2016-06-25 ENCOUNTER — Encounter: Payer: Self-pay | Admitting: Family

## 2016-06-27 DIAGNOSIS — F3181 Bipolar II disorder: Secondary | ICD-10-CM | POA: Diagnosis not present

## 2016-07-10 MED FILL — GABAPENTIN 100 MG CAPSULE: 100 | 30 days supply | Qty: 120 | Fill #0

## 2016-07-16 DIAGNOSIS — F410 Panic disorder [episodic paroxysmal anxiety] without agoraphobia: Secondary | ICD-10-CM | POA: Diagnosis not present

## 2016-07-18 MED FILL — LORazepam 0.5 MG TABS: 0.5 | 30 days supply | Qty: 60 | Fill #0

## 2016-07-18 MED FILL — FLUoxetine HCL 10 MG CAPS: 10 | 30 days supply | Qty: 90 | Fill #0

## 2016-07-18 MED FILL — LARIN FE 1.5-30 TABLET: 1.5-30 | 28 days supply | Qty: 28 | Fill #0

## 2016-08-21 MED FILL — FLUoxetine HCL 10 MG CAPS: 10 | 30 days supply | Qty: 90 | Fill #1

## 2016-11-25 MED FILL — FLUoxetine HCL 10 MG CAPS: 10 | 30 days supply | Qty: 90 | Fill #2

## 2016-11-28 DIAGNOSIS — F410 Panic disorder [episodic paroxysmal anxiety] without agoraphobia: Secondary | ICD-10-CM | POA: Diagnosis not present

## 2016-12-07 ENCOUNTER — Encounter (HOSPITAL_COMMUNITY): Payer: Self-pay

## 2016-12-07 ENCOUNTER — Emergency Department (HOSPITAL_COMMUNITY)
Admission: EM | Admit: 2016-12-07 | Discharge: 2016-12-08 | Disposition: A | Discharge status: Home / Self Care | Payer: 59 | Attending: Emergency Medicine | Admitting: Emergency Medicine

## 2016-12-07 DIAGNOSIS — Z630 Problems in relationship with spouse or partner: Secondary | ICD-10-CM | POA: Diagnosis not present

## 2016-12-07 DIAGNOSIS — Y999 Unspecified external cause status: Secondary | ICD-10-CM | POA: Diagnosis not present

## 2016-12-07 DIAGNOSIS — T1491XA Suicide attempt, initial encounter: Secondary | ICD-10-CM | POA: Insufficient documentation

## 2016-12-07 DIAGNOSIS — Y929 Unspecified place or not applicable: Secondary | ICD-10-CM | POA: Insufficient documentation

## 2016-12-07 DIAGNOSIS — F322 Major depressive disorder, single episode, severe without psychotic features: Secondary | ICD-10-CM | POA: Diagnosis not present

## 2016-12-07 DIAGNOSIS — X838XXA Intentional self-harm by other specified means, initial encounter: Secondary | ICD-10-CM

## 2016-12-07 DIAGNOSIS — X58XXXA Exposure to other specified factors, initial encounter: Secondary | ICD-10-CM | POA: Diagnosis not present

## 2016-12-07 DIAGNOSIS — Y939 Activity, unspecified: Secondary | ICD-10-CM | POA: Insufficient documentation

## 2016-12-07 DIAGNOSIS — Z79899 Other long term (current) drug therapy: Secondary | ICD-10-CM | POA: Insufficient documentation

## 2016-12-07 LAB — RAPID URINE DRUG SCREEN, HOSP PERFORMED
AMPHETAMINES: NOT DETECTED
BENZODIAZEPINES: NOT DETECTED
Barbiturates: NOT DETECTED
COCAINE: NOT DETECTED
Opiates: NOT DETECTED
Tetrahydrocannabinol: NOT DETECTED

## 2016-12-07 LAB — COMPREHENSIVE METABOLIC PANEL
ALBUMIN: 4.2 g/dL (ref 3.5–5.0)
ALT: 11 U/L — ABNORMAL LOW (ref 14–54)
ANION GAP: 6 (ref 5–15)
AST: 15 U/L (ref 15–41)
Alkaline Phosphatase: 38 U/L (ref 38–126)
BUN: 11 mg/dL (ref 6–20)
CHLORIDE: 106 mmol/L (ref 101–111)
CO2: 25 mmol/L (ref 22–32)
Calcium: 9.3 mg/dL (ref 8.9–10.3)
Creatinine, Ser: 0.57 mg/dL (ref 0.44–1.00)
GFR calc Af Amer: >60 mL/min (ref >60)
GFR calc non Af Amer: >60 mL/min (ref >60)
GLUCOSE: 114 mg/dL — AB (ref 65–99)
POTASSIUM: 3.6 mmol/L (ref 3.5–5.1)
SODIUM: 137 mmol/L (ref 135–145)
TOTAL PROTEIN: 7.1 g/dL (ref 6.5–8.1)
Total Bilirubin: 0.4 mg/dL (ref 0.3–1.2)

## 2016-12-07 LAB — CBC
HEMATOCRIT: 40.2 % (ref 36.0–46.0)
HEMOGLOBIN: 13.8 g/dL (ref 12.0–15.0)
MCH: 30.1 pg (ref 26.0–34.0)
MCHC: 34.3 g/dL (ref 30.0–36.0)
MCV: 87.8 fL (ref 78.0–100.0)
Platelets: 213 10*3/uL (ref 150–400)
RBC: 4.58 MIL/uL (ref 3.87–5.11)
RDW: 12.6 % (ref 11.5–15.5)
WBC: 6 10*3/uL (ref 4.0–10.5)

## 2016-12-07 LAB — SALICYLATE LEVEL: Salicylate Lvl: <7 mg/dL (ref 2.8–30.0)

## 2016-12-07 LAB — ETHANOL: Alcohol, Ethyl (B): <5 mg/dL (ref <5)

## 2016-12-07 LAB — ACETAMINOPHEN LEVEL

## 2016-12-07 LAB — POC URINE PREG, ED: Preg Test, Ur: NEGATIVE

## 2016-12-07 MED ORDER — ACETAMINOPHEN 325 MG PO TABS: 650.0000 mg | ORAL_TABLET | ORAL | Status: DC | PRN

## 2016-12-07 MED ORDER — ONDANSETRON HCL 4 MG PO TABS: 4.0000 mg | ORAL_TABLET | Freq: Three times a day (TID) | ORAL | Status: DC | PRN

## 2016-12-07 MED ORDER — ONDANSETRON 4 MG PO TBDP
4.0000 mg | ORAL_TABLET | Freq: Three times a day (TID) | ORAL | Status: DC | PRN
  Administered 2016-12-07: 4 mg via ORAL
  Filled 2016-12-07: qty 1

## 2016-12-07 MED ORDER — PANTOPRAZOLE SODIUM 20 MG PO TBEC
20.0000 mg | DELAYED_RELEASE_TABLET | Freq: Once | ORAL | Status: AC
  Administered 2016-12-07: 20 mg via ORAL
  Filled 2016-12-07: qty 1

## 2016-12-07 NOTE — ED Notes (Addendum)
Pt accepted to [facility] room 407-1 under Dr. Parke Poisson. Note to provider for EMTALA

## 2016-12-07 NOTE — BH Assessment (Signed)
Assessment completd. Consulted with Serena Colonel, NP who recommends inpatient treatment.   Rosalin Hawking, LCSW Therapeutic Triage Specialist [place] 12/07/2016 4:56 PM

## 2016-12-07 NOTE — ED Notes (Signed)
Called report to [facility] RN , [place] called for transport

## 2016-12-07 NOTE — ED Notes (Signed)
SBAR Report received from previous nurse. Pt received calm and tearful in bed. Pt denies. Pt denies current  HI, A/V H,  or pain at this time, but endorses situational passive SI, anxiety, and depression but  appears otherwise stable and free of distress. Pt confused with making most decisions and has deferred many of her choices to her husband. Pt originally stated that she wanted to go but has agreed to IP treatment after talking with Probation officer about options.  Pt reminded of camera surveillance, q 15 min rounds, and rules of the milieu. Will continue to assess.

## 2016-12-07 NOTE — ED Notes (Signed)
Patient wanded by security. 

## 2016-12-07 NOTE — ED Notes (Signed)
Transferred from [facility].  Patient calm and cooperative. Denies SI. C/O HA but declined medication.  Oriented to unit.  Permission sought for husband to visit. Support offered.
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
## 2016-12-07 NOTE — ED Notes (Signed)
Large amount spontaneous emesis. MD notified.
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
## 2016-12-07 NOTE — ED Notes (Signed)
Bed: [id number] Expected date:  Expected time:  Means of arrival:  Comments: Rm 26

## 2016-12-07 NOTE — ED Notes (Signed)
Security called to wand patient 
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
## 2016-12-08 ENCOUNTER — Encounter (HOSPITAL_COMMUNITY): Payer: Self-pay

## 2016-12-08 ENCOUNTER — Inpatient Hospital Stay (HOSPITAL_COMMUNITY)
Admission: AD | Admit: 2016-12-08 | Discharge: 2016-12-10 | DRG: 885 | Disposition: A | Discharge status: Home / Self Care | Payer: 59 | Source: Intra-hospital | Attending: Psychiatry | Admitting: Psychiatry

## 2016-12-08 DIAGNOSIS — T1491XA Suicide attempt, initial encounter: Secondary | ICD-10-CM

## 2016-12-08 DIAGNOSIS — F330 Major depressive disorder, recurrent, mild: Secondary | ICD-10-CM | POA: Diagnosis present

## 2016-12-08 DIAGNOSIS — R45851 Suicidal ideations: Secondary | ICD-10-CM | POA: Diagnosis present

## 2016-12-08 DIAGNOSIS — G4700 Insomnia, unspecified: Secondary | ICD-10-CM | POA: Diagnosis present

## 2016-12-08 DIAGNOSIS — Z811 Family history of alcohol abuse and dependence: Secondary | ICD-10-CM

## 2016-12-08 DIAGNOSIS — Z818 Family history of other mental and behavioral disorders: Secondary | ICD-10-CM

## 2016-12-08 DIAGNOSIS — Z79899 Other long term (current) drug therapy: Secondary | ICD-10-CM

## 2016-12-08 DIAGNOSIS — G4710 Hypersomnia, unspecified: Secondary | ICD-10-CM | POA: Diagnosis present

## 2016-12-08 DIAGNOSIS — Z915 Personal history of self-harm: Secondary | ICD-10-CM | POA: Diagnosis not present

## 2016-12-08 DIAGNOSIS — F419 Anxiety disorder, unspecified: Secondary | ICD-10-CM | POA: Diagnosis present

## 2016-12-08 DIAGNOSIS — F332 Major depressive disorder, recurrent severe without psychotic features: Principal | ICD-10-CM | POA: Diagnosis present

## 2016-12-08 DIAGNOSIS — F429 Obsessive-compulsive disorder, unspecified: Secondary | ICD-10-CM | POA: Diagnosis present

## 2016-12-08 LAB — LIPID PANEL
CHOL/HDL RATIO: 2.4 ratio
Cholesterol: 158 mg/dL (ref 0–200)
HDL: 66 mg/dL (ref >40)
LDL CALC: 74 mg/dL (ref 0–99)
Triglycerides: 88 mg/dL (ref <150)
VLDL: 18 mg/dL (ref 0–40)

## 2016-12-08 LAB — TSH: TSH: 0.968 u[IU]/mL (ref 0.350–4.500)

## 2016-12-08 MED ORDER — MAGNESIUM HYDROXIDE 400 MG/5ML PO SUSP: 30.0000 mL | Freq: Every day | ORAL | Status: DC | PRN

## 2016-12-08 MED ORDER — ALUM & MAG HYDROXIDE-SIMETH 200-200-20 MG/5ML PO SUSP: 30.0000 mL | ORAL | Status: DC | PRN

## 2016-12-08 MED ORDER — HYDROXYZINE HCL 25 MG PO TABS: 25.0000 mg | ORAL_TABLET | Freq: Three times a day (TID) | ORAL | Status: DC | PRN

## 2016-12-08 MED ORDER — TRAZODONE HCL 50 MG PO TABS: 50.0000 mg | ORAL_TABLET | Freq: Every evening | ORAL | Status: DC | PRN

## 2016-12-08 MED ORDER — ACETAMINOPHEN 325 MG PO TABS: 650.0000 mg | ORAL_TABLET | Freq: Four times a day (QID) | ORAL | Status: DC | PRN

## 2016-12-08 MED ORDER — LORAZEPAM 0.5 MG PO TABS
0.5000 mg | ORAL_TABLET | Freq: Every day | ORAL | Status: DC | PRN
  Administered 2016-12-08 – 2016-12-09 (×3): 0.5 mg via ORAL
  Filled 2016-12-08 (×3): qty 1

## 2016-12-08 MED ORDER — FLUOXETINE HCL 20 MG PO CAPS
30.0000 mg | ORAL_CAPSULE | Freq: Every day | ORAL | Status: DC
  Administered 2016-12-08 – 2016-12-10 (×3): 30 mg via ORAL
  Filled 2016-12-08 (×6): qty 1

## 2016-12-08 NOTE — Tx Team (Signed)
Interdisciplinary Treatment and Diagnostic Plan Update  12/08/2016 Time of Session: 9:30am Kristl Morioka MRN: [id number]  Principal Diagnosis: Suicide Attempt   Secondary Diagnoses: Active Problems:   Recurrent major depression-severe (HCC)   Current Medications:  Current Facility-Administered Medications  Medication Dose Route Frequency Provider Last Rate Last Dose  . acetaminophen (TYLENOL) tablet 650 mg  650 mg Oral Q6H PRN Rozetta Nunnery, NP      . alum & mag hydroxide-simeth (MAALOX/MYLANTA) 200-200-20 MG/5ML suspension 30 mL  30 mL Oral Q4H PRN Rozetta Nunnery, NP      . FLUoxetine (PROZAC) capsule 30 mg  30 mg Oral Daily Rozetta Nunnery, NP   30 mg at 12/08/16 0841  . hydrOXYzine (ATARAX/VISTARIL) tablet 25 mg  25 mg Oral TID PRN Rozetta Nunnery, NP      . LORazepam (ATIVAN) tablet 0.5 mg  0.5 mg Oral Daily PRN Rozetta Nunnery, NP   0.5 mg at 12/08/16 0137  . magnesium hydroxide (MILK OF MAGNESIA) suspension 30 mL  30 mL Oral Daily PRN Rozetta Nunnery, NP      . traZODone (DESYREL) tablet 50 mg  50 mg Oral QHS PRN Rozetta Nunnery, NP        PTA Medications: Prescriptions Prior to Admission  Medication Sig Dispense Refill Last Dose  . acetaminophen (TYLENOL) 500 MG tablet Take 500 mg by mouth every 8 (eight) hours as needed for mild pain or headache.   12/06/2016  . FLUoxetine (PROZAC) 10 MG capsule Take 30 mg by mouth daily.   Past Week at Unknown time  . GABAPENTIN PO Take by mouth as directed. Take 1 tablet in the morning, 1 tablet in the afternoon, and 2 tablets at bedtime   Past Week at Unknown time  . LORazepam (ATIVAN) 0.5 MG tablet Take 0.5 mg by mouth daily as needed for anxiety.   Past Month at Unknown time    Treatment Modalities: Medication Management, Group therapy, Case management,  1 to 1 session with clinician, Psychoeducation, Recreational therapy.  Patient Stressors: Marital or family conflict  Patient Strengths: Ability for insight Active sense of humor Average or  above average intelligence Motivation for treatment/growth Supportive family/friends Work Artist for Primary Diagnosis: Suicide Attempt  Long Term Goal(s): Improvement in symptoms so as ready for discharge  Short Term Goals: Ability to verbalize feelings will improve Ability to disclose and discuss suicidal ideas Ability to demonstrate self-control will improve Ability to identify and develop effective coping behaviors will improve Ability to maintain clinical measurements within normal limits will improve Ability to verbalize feelings will improve Ability to disclose and discuss suicidal ideas Ability to demonstrate self-control will improve Ability to identify and develop effective coping behaviors will improve Ability to maintain clinical measurements within normal limits will improve  Medication Management: Evaluate patient's response, side effects, and tolerance of medication regimen.  Therapeutic Interventions: 1 to 1 sessions, Unit Group sessions and Medication administration.  Evaluation of Outcomes: Not Met  Physician Treatment Plan for Secondary Diagnosis: Active Problems:   Recurrent major depression-severe ([place])   Long Term Goal(s): Improvement in symptoms so as ready for discharge  Short Term Goals: Ability to verbalize feelings will improve Ability to disclose and discuss suicidal ideas Ability to demonstrate self-control will improve Ability to identify and develop effective coping behaviors will improve Ability to maintain clinical measurements within normal limits will improve Ability to verbalize feelings will improve Ability to disclose and discuss suicidal ideas Ability  to demonstrate self-control will improve Ability to identify and develop effective coping behaviors will improve Ability to maintain clinical measurements within normal limits will improve  Medication Management: Evaluate patient's response, side effects, and  tolerance of medication regimen.  Therapeutic Interventions: 1 to 1 sessions, Unit Group sessions and Medication administration.  Evaluation of Outcomes: Not Met   RN Treatment Plan for Primary Diagnosis: Suicide Attempt  Long Term Goal(s): Knowledge of disease and therapeutic regimen to maintain health will improve  Short Term Goals: Ability to verbalize feelings will improve, Ability to disclose and discuss suicidal ideas and Ability to identify and develop effective coping behaviors will improve  Medication Management: RN will administer medications as ordered by provider, will assess and evaluate patient's response and provide education to patient for prescribed medication. RN will report any adverse and/or side effects to prescribing provider.  Therapeutic Interventions: 1 on 1 counseling sessions, Psychoeducation, Medication administration, Evaluate responses to treatment, Monitor vital signs and CBGs as ordered, Perform/monitor CIWA, COWS, AIMS and Fall Risk screenings as ordered, Perform wound care treatments as ordered.  Evaluation of Outcomes: Not Met   LCSW Treatment Plan for Primary Diagnosis: Suicide Attempt  Long Term Goal(s): Safe transition to appropriate next level of care at discharge, Engage patient in therapeutic group addressing interpersonal concerns.  Short Term Goals: Engage patient in aftercare planning with referrals and resources, Identify triggers associated with mental health/substance abuse issues and Increase skills for wellness and recovery  Therapeutic Interventions: Assess for all discharge needs, 1 to 1 time with Social worker, Explore available resources and support systems, Assess for adequacy in community support network, Educate family and significant other(s) on suicide prevention, Complete Psychosocial Assessment, Interpersonal group therapy.  Evaluation of Outcomes: Not Met   Progress in Treatment: Attending groups: Pt is new to milieu,  continuing to assess  Participating in groups: Pt is new to milieu, continuing to assess  Taking medication as prescribed: Yes, MD continues to assess for medication changes as needed Toleration medication: Yes, no side effects reported at this time Family/Significant other contact made: No, CSW assessing for appropriate contact Patient understands diagnosis: Continuing to assess Discussing patient identified problems/goals with staff: Yes Medical problems stabilized or resolved: Yes Denies suicidal/homicidal ideation: Yes Issues/concerns per patient self-inventory: None Other: N/A  New problem(s) identified: None identified at this time.   New Short Term/Long Term Goal(s): None identified at this time.   Discharge Plan or Barriers: CSW will assess for appropriate discharge plan and relevant barriers.   Reason for Continuation of Hospitalization: Anxiety Depression Medication stabilization  Estimated Length of Stay: 2-3 days  Attendees: Patient: 12/08/2016  1:00 PM  Physician: Dr. Parke Poisson 12/08/2016  1:00 PM  Nursing: Macie Burows, RN 12/08/2016  1:00 PM  RN Care Manager: Lars Pinks, RN 12/08/2016  1:00 PM  Social Worker: Adriana Reams, LCSW; Matthew Saras, [place] 12/08/2016  1:00 PM  Recreational Therapist:  12/08/2016  1:00 PM  Other: Lindell Spar, NP 12/08/2016  1:00 PM  Other:  12/08/2016  1:00 PM  Other: 12/08/2016  1:00 PM    Scribe for Treatment Team: Gladstone Lighter, LCSW 12/08/2016 1:00 PM

## 2016-12-08 NOTE — Progress Notes (Signed)
Recreation Therapy Notes  Date: 12/08/16 Time: 0930 Location: [address] Dayroom  Group Topic: Stress Management  Goal Area(s) Addresses:  Patient will verbalize importance of using healthy stress management.  Patient will identify positive emotions associated with healthy stress management.   Behavioral Response: Engaged  Intervention: Stress Management  Activity :  Guided Imagery.  LRT introduced to thebstress management technique guided imagery.  LRT read a script that allowed patients to take a "mental vacation" from their surroundings.  Patients were to follow along as LRT read script to engage in the technique.   Education:  Stress Management, Discharge Planning.   Education Outcome: Acknowledges edcuation/In group clarification offered/Needs additional education  Clinical Observations/Feedback: Pt attended group.    Victorino Sparrow, LRT/CTRS         Victorino Sparrow A 12/08/2016 11:57 AM

## 2016-12-08 NOTE — Tx Team (Signed)
Initial Treatment Plan 12/08/2016 3:37 AM Julie Webb [id number]    PATIENT STRESSORS: Marital or family conflict   PATIENT STRENGTHS: Ability for insight Active sense of humor Average or above average intelligence Motivation for treatment/growth Supportive family/friends Work skills   PATIENT IDENTIFIED PROBLEMS: Stress  Marital conflict      " I need to take my medicine regularly."  "Outpatient therapy and marriage counseling"           DISCHARGE CRITERIA:    PRELIMINARY DISCHARGE PLAN: Return to previous living arrangement  PATIENT/FAMILY INVOLVEMENT: This treatment plan has been presented to and reviewed with the patient, Julie Webb, and/or family member, .  The patient and family have been given the opportunity to ask questions and make suggestions.  Aurora Mask, RN 12/08/2016, 3:37 AM

## 2016-12-08 NOTE — ED Notes (Signed)
Pt dc to [facility]

## 2016-12-08 NOTE — BHH Counselor (Signed)
Adult Comprehensive Assessment  Patient ID: Julie Webb, female   DOB: [birth year], 26 y.o.   MRN: [id number]  Information Source: Information source: Patient  Current Stressors:  Educational / Learning stressors: None reported Employment / Job issues: working night shift Family Relationships: conflict with husband due to work schedule; parents are alcoholic and have financial and health Nature conservation officer / Lack of resources (include bankruptcy): None reported Housing / Lack of housing: None reported Physical health (include injuries & life threatening diseases): None reported Social relationships: limited social support Substance abuse: None reported Bereavement / Loss: None reported  Living/Environment/Situation:  Living Arrangements: Spouse/significant other Living conditions (as described by patient or guardian): all needs met How long has patient lived in current situation?: 8 years What is atmosphere in current home: Loving, Chaotic  Family History:  Marital status: Married Number of Years Married: 1 What types of issues is patient dealing with in the relationship?: increased conflict due to their schedules not matching; have been together for 8 yrs Does patient have children?: No  Childhood History:  By whom was/is the patient raised?: Both parents Description of patient's relationship with caregiver when they were a child: "fine"; both parents are alcoholics so they spent a lot of money on that rather than other things; grew up poor Patient's description of current relationship with people who raised him/her: parents drinking has escalated and they have had health concerns; relationship is improving Does patient have siblings?: Yes Number of Siblings: 2 Description of patient's current relationship with siblings: oldest brother lives in [place]- also an alcoholic; younger brother lives with their parents, very close with him Did patient suffer any  verbal/emotional/physical/sexual abuse as a child?: No Did patient suffer from severe childhood neglect?: Yes Patient description of severe childhood neglect: lack of supervision at times due to alcoholism Has patient ever been sexually abused/assaulted/raped as an adolescent or adult?: No Was the patient ever a victim of a crime or a disaster?: No Witnessed domestic violence?: No Has patient been effected by domestic violence as an adult?: No  Education:  Highest grade of school patient has completed: Buyer, retail in Nursing Currently a student?: No Learning disability?: No  Employment/Work Situation:   Employment situation: Employed 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, hospital  How long has patient been employed?: 109yr Patient's job has been impacted by current illness: No What is the longest time patient has a held a job?: 386yrWhere was the patient employed at that time?: current employer Has patient ever been in the [employer] No Has patient ever served in combat?: No Did You Receive Any Psychiatric Treatment/Services While in thPassenger transport manager No Are There Guns or Other [place] in [place] Yes Types of Guns/Weapons: guns Are These WeCorporate investment bankerecured?: Yes (confiscated by police; however, they are usually locked up)  Financial Resources:   Financial resources: Income from employment, Income from spouse, Private insurance Does patient have a representative payee or guardian?: No  Alcohol/Substance Abuse:   What has been your use of drugs/alcohol within the last 12 months?: Pt denies If attempted suicide, did drugs/alcohol play a role in this?: No Alcohol/Substance Abuse Treatment Hx: Denies past history Has alcohol/substance abuse ever caused legal problems?: No  Social Support System:   PaPensions consultantupport System: Fair DeAstronomerystem: husband, younger brother, parents Type of faith/religion: None How does patient's faith help to cope with  current illness?: n/a  Leisure/Recreation:   Leisure and Hobbies: sleeping, playing video games, playing with dog  Strengths/Needs:   What things does the patient do well?: good at her job, kind, being a good person In what areas does patient struggle / problems for patient: sleeping too much, anxiety, being more involved at home  Discharge Plan:   Does patient have access to transportation?: Yes Will patient be returning to same living situation after discharge?: Yes Currently receiving community mental health services: Yes (From Whom) (Dr. Creig Hines at [facility]; would like a therapist there as well) If no, would patient like referral for services when discharged?: No Does patient have financial barriers related to discharge medications?: No  Summary/Recommendations:     Patient is a 26 year old female with a diagnosis of Major Depressive Disorder. Pt presented to the hospital after engaging in suicidal gestures after an argument with her husband. Pt reports primary trigger(s) for admission include conflict with husband. Patient will benefit from crisis stabilization, medication evaluation, group therapy and psycho education in addition to case management for discharge planning. At discharge it is recommended that Pt remain compliant with established discharge plan and continued treatment.   Gladstone Lighter. 12/08/2016

## 2016-12-08 NOTE — Progress Notes (Signed)
Adult Psychoeducational Group Note  Date:  12/08/2016 Time:  1:41 PM  Group Topic/Focus:  Goals Group:   The focus of this group is to help patients establish daily goals to achieve during treatment and discuss how the patient can incorporate goal setting into their daily lives to aide in recovery.  Participation Level:  Active  Participation Quality:  Attentive  Affect:  Appropriate  Cognitive:  Appropriate  Insight: Appropriate  Engagement in Group:  Engaged  Modes of Intervention:  Discussion  Additional Comments:  Pt states that she would like to talk to the doctor and see what he recommends about her current situation, as well as talking to the social worker about a possible marriage counselor for her and her husband.  Julie Webb R Laurey Salser 12/08/2016, 1:41 PM

## 2016-12-08 NOTE — Progress Notes (Signed)
D:  Patient's self inventory sheet, patient sleeps good, sleep medicaion helpful.  Good appetite, normal energy level, good concentration.  Rated depression and hopeless 3, anxiety 5.  Denied withdrawals.  Denied SI.  Denied physical problems.  Denied pain.  Goal is outpatient therapy set up, marriage counseling.  Goal is to talk to MD/SW.  Wants to return to work.  Does have discharge plans. A:  Medications administered per MD orders.  Emotional support and encouragement given patient. R:  Denied SI and HI, contracts for safety.  Denied A/V hallucinations.  Safety maintained with 15 minute checks.

## 2016-12-08 NOTE — Progress Notes (Signed)
Pt attend group. Her day was a 8. Pt said she accomplish her goal to take the next step out. To leave she talk to social  worker accomplish good things for her to look toward future.

## 2016-12-08 NOTE — BHH Suicide Risk Assessment (Signed)
[facility] Ad[facility] Suicide Risk Assessment   Nursing information obtained from:  Patient Demographic factors:  Caucasian, Access to firearms Current Mental Status:  Suicidal ideation indicated by patient Loss Factors:  NA Historical Factors:  Family history of mental illness or substance abuse Risk Reduction Factors:  Employed, Positive social support  Total Time spent with patient: 45 minutes Principal Problem:  Suicidal Attempt  Diagnosis:   Patient Active Problem List   Diagnosis Date Noted  . 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  World Pharmacologist [facility]). Score between 0-7:  no or low risk or alcohol related problems. Score between 8-15:  moderate risk of alcohol related problems. Score between 16-19:  high risk of alcohol related problems. Score 20 or above:  warrants further diagnostic evaluation for alcohol dependence and treatment.   CLINICAL FACTORS:  26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, recent impulsive suicide attempt in the context of argument with husband.    Psychiatric Specialty Exam: Physical Exam  ROS  Blood pressure 94/61, pulse 99, temperature 98 F (36.7 C), temperature source Oral, resp. rate 16, height 5\' 4"  (1.626 m), weight 53.1 kg (117 lb), last menstrual period 12/04/2016.Body mass index is 20.08 kg/m.   see admit note MSE    COGNITIVE FEATURES THAT CONTRIBUTE TO RISK:  Closed-mindedness and Loss of executive function    SUICIDE RISK:   Moderate:  Frequent suicidal ideation with limited intensity, and duration, some specificity in terms of plans, no associated intent, good self-control, limited dysphoria/symptomatology, some risk factors present, and identifiable protective factors, including available and accessible social  support.  PLAN OF CARE: Patient will be admitted to inpatient psychiatric unit for stabilization and safety. Will provide and encourage milieu participation. Provide medication management and maked adjustments as needed.  Will follow daily.    I certify that inpatient services furnished can reasonably be expected to improve the patient's condition.   Neita Garnet, MD 12/08/2016, 2:49 PM

## 2016-12-08 NOTE — Plan of Care (Signed)
Problem: Education: Goal: Utilization of techniques to improve thought processes will improve Outcome: Progressing Nurse discussed depression/coping skills with patient.    

## 2016-12-08 NOTE — H&P (Signed)
Psychiatric Admission Assessment Adult  Patient Identification: Julie Webb MRN:  [id number] Date of Evaluation:  12/08/2016 Chief Complaint:  " I had a bad day" Principal Diagnosis:  Suicide Attempt  Diagnosis:   Patient Active Problem List   Diagnosis Date Noted  . 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 She is an Therapist, sports, works night time shift.  She reports recent argument with her husband, regarding issues related to not seeing each other often due to her shift work. States that " he was acting poorly, he was loud, very emotional". States that during the argument , she became acutely suicidal, without any prior planning and in quick succession  she impulsively took a shot gun to shoot self , which she states she could not operate because had a trigger lock, then tried to pull out a hand gun, which her husband took from her, and then tried to overdose on medications , which were also removed by husband ( did not actually swallow any ). 911 was contacted and she was brought to hospital. States that even before this incident she had been feeling somewhat depressed,but states it was relatively mild. She denies any recent suicidal ideations, and states her suicidal ideations were impulsive, unplanned, and related to argument. She denies any suicidal ideations at this time. States that she has history of depression and anxiety, had recently stopped her psychiatric medications ( about two months ago) , because she was feeling better.   Associated Signs/Symptoms: Depression Symptoms:  anhedonia, hypersomnia, suicidal attempt, loss of energy/fatigue,  Of note denies any suicidal ideations prior to above incident  (Hypo) Manic Symptoms: denies  Anxiety Symptoms:  Reports worrying excessively, mainly about her  parents.  Psychotic Symptoms:  Denies  PTSD Symptoms: Denies  Total Time spent with patient: 45 minutes  Past  Psychiatric History: no prior psychiatric admissions, no prior suicidal attempts but states she has had suicidal ideations in the past, although not recently. History of self cutting for a period of a few months at age 46, but not since then, no history of PTSD, no history of mania, no history of psychosis. She reports occasional panic attacks, often related to family stressors, but denies agoraphobia.  Is the patient at risk to self? Yes.    Has the patient been a risk to self in the past 6 months? No.  Has the patient been a risk to self within the distant past? Yes.    Is the patient a risk to others? No.  Has the patient been a risk to others in the past 6 months? No.  Has the patient been a risk to others within the distant past? No.   Prior Inpatient Therapy:  no prior inpatient psychiatric admissions  Prior Outpatient Therapy:  sees Dr. Creig Hines at [facility], does not currently have a therapist   Alcohol Screening: 1. How often do you have a drink containing alcohol?: Monthly or less 2. How many drinks containing alcohol do you have on a typical day when you are drinking?: 1 or 2 3. How often do you have six or more drinks on one occasion?: Never Preliminary Score: 0 9. Have you or someone else been injured as a result of your drinking?: No 10. Has a relative or friend or a doctor or another health worker been concerned about your drinking or suggested you cut down?: No Alcohol Use Disorder Identification Test Final Score (AUDIT): 1 Brief Intervention: AUDIT score  less than 7 or less-screening does not suggest unhealthy drinking-brief intervention not indicated Substance Abuse History in the last 12 months:  Denies any drug or alcohol abuse .  Consequences of Substance Abuse: Denies  Previous Psychotropic Medications: Prozac, Neurontin . She had done well on Prozac, which she feels works, In the past has tried Wellbutrin but caused weight loss, and Remeron, which she feels did not  help. She also was prescribed Ativan PRNs for anxiety, denies any abuse or misuse and has not taken in several weeks . Psychological Evaluations:  No  Past Medical History: denies any medical illness, NKDA, does not smoke . Past Medical History:  Diagnosis Date  . Anxiety   . Depression    History reviewed. No pertinent surgical history. Family History:  Parents alive, live together, has two brothers Family Psychiatric  History: states both parents are alcoholic , grandmother has history of Bipolar Disorder, and feels brother has history of undiagnosed depression and anxiety . No suicides in family . Tobacco Screening: Have you used any form of tobacco in the last 30 days? (Cigarettes, Smokeless Tobacco, Cigars, and/or Pipes): No Social History: 26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, married last October, but together for several years, no children, she is an Therapist, sports, works full time, nigh time shift. Denies legal issues . Reports recent argument with husband was acute stressor leading to decompensation. Other stressors include her father having had an MI recently and parents having financial difficulties  History  Alcohol Use  . Yes    Comment: occasionally- once a month if then pt reports     History  Drug Use No    Additional Social History:  Allergies:  No Known Allergies Lab Results:  Results for orders placed or performed during the hospital encounter of 12/08/16 (from the past 48 hour(s))  TSH     Status: None   Collection Time: 12/08/16  6:05 AM  Result Value Ref Range   TSH 0.968 0.350 - 4.500 uIU/mL    Comment: Performed by a 3rd Generation assay with a functional sensitivity of <=0.01 uIU/mL. Performed at [facility], [place] [address] [place], [place] [address]   Lipid panel     Status: None   Collection Time: 12/08/16  6:05 AM  Result Value Ref Range   Cholesterol 158 0 - 200 mg/dL   Triglycerides 88 <150 mg/dL   HDL 66 >40 mg/dL   Total CHOL/HDL Ratio 2.4  RATIO   VLDL 18 0 - 40 mg/dL   LDL Cholesterol 74 0 - 99 mg/dL    Comment:        Total Cholesterol/HDL:CHD Risk Coronary Heart Disease Risk Table                     Men   Women  1/2 Average Risk   3.4   3.3  Average Risk       5.0   4.4  2 X Average Risk   9.6   7.1  3 X Average Risk  23.4   11.0        Use the calculated Patient Ratio above and the CHD Risk Table to determine the patient's CHD Risk.        ATP III CLASSIFICATION (LDL):  <100     mg/dL   Optimal  100-129  mg/dL   Near or Above                    Optimal  130-159  mg/dL   Borderline  160-189  mg/dL   High  >190     mg/dL   Very High Performed at [facility] [address] [place], [place] [address]     Blood Alcohol level:  Lab Results  Component Value Date   ETH <5 [id number]    Metabolic Disorder Labs:  No results found for: HGBA1C, MPG No results found for: PROLACTIN Lab Results  Component Value Date   CHOL 158 12/08/2016   TRIG 88 12/08/2016   HDL 66 12/08/2016   CHOLHDL 2.4 12/08/2016   VLDL 18 12/08/2016   LDLCALC 74 12/08/2016    Current Medications: Current Facility-Administered Medications  Medication Dose Route Frequency Provider Last Rate Last Dose  . acetaminophen (TYLENOL) tablet 650 mg  650 mg Oral Q6H PRN Rozetta Nunnery, NP      . alum & mag hydroxide-simeth (MAALOX/MYLANTA) 200-200-20 MG/5ML suspension 30 mL  30 mL Oral Q4H PRN Rozetta Nunnery, NP      . FLUoxetine (PROZAC) capsule 30 mg  30 mg Oral Daily Rozetta Nunnery, NP   30 mg at 12/08/16 0841  . hydrOXYzine (ATARAX/VISTARIL) tablet 25 mg  25 mg Oral TID PRN Rozetta Nunnery, NP      . LORazepam (ATIVAN) tablet 0.5 mg  0.5 mg Oral Daily PRN Rozetta Nunnery, NP   0.5 mg at 12/08/16 0137  . magnesium hydroxide (MILK OF MAGNESIA) suspension 30 mL  30 mL Oral Daily PRN Rozetta Nunnery, NP      . traZODone (DESYREL) tablet 50 mg  50 mg Oral QHS PRN Rozetta Nunnery, NP       PTA Medications: Prescriptions Prior to Admission   Medication Sig Dispense Refill Last Dose  . acetaminophen (TYLENOL) 500 MG tablet Take 500 mg by mouth every 8 (eight) hours as needed for mild pain or headache.   12/06/2016  . FLUoxetine (PROZAC) 10 MG capsule Take 30 mg by mouth daily.   Past Week at Unknown time  . GABAPENTIN PO Take by mouth as directed. Take 1 tablet in the morning, 1 tablet in the afternoon, and 2 tablets at bedtime   Past Week at Unknown time  . LORazepam (ATIVAN) 0.5 MG tablet Take 0.5 mg by mouth daily as needed for anxiety.   Past Month at Unknown time    Musculoskeletal: Strength & Muscle Tone: within normal limits Gait & Station: normal Patient leans: N/A  Psychiatric Specialty Exam: Physical Exam  Review of Systems  Constitutional: Negative.   Musculoskeletal: Negative.   Psychiatric/Behavioral: Positive for depression and suicidal ideas. The patient is nervous/anxious.     Blood pressure 94/61, pulse 99, temperature 98 F (36.7 C), temperature source Oral, resp. rate 16, height 5\' 4"  (1.626 m), weight 53.1 kg (117 lb), last menstrual period 12/04/2016.Body mass index is 20.08 kg/m.  General Appearance: Well Groomed  Eye Contact:  Good  Speech:  Normal Rate  Volume:  Normal  Mood:  reports she is feeling better today  Affect:  mildly constricted, but reactive   Thought Process:  Linear and Descriptions of Associations: Intact  Orientation:  Full (Time, Place, and Person)  Thought Content:  no hallucinations, no delusions, not internally preoccupied   Suicidal Thoughts:  No denies any suicidal or self injurious ideations , contracts for safety on the unit,  no homicidal or violent ideations  Homicidal Thoughts:  No  Memory:  recent and remote grossly intact   Judgement:  Other:  fair   Insight:  Fair  Psychomotor Activity:  Normal  Concentration:  Concentration: Good and Attention Span: Good  Recall:  Good  Fund of Knowledge:  Good  Language:  Good  Akathisia:  Negative  Handed:  Right  AIMS  (if indicated):     Assets:  Communication Skills Desire for Improvement Resilience  ADL's:  Intact  Cognition:  WNL  Sleep:  Number of Hours: 3.5    Treatment Plan Summary: Daily contact with patient to assess and evaluate symptoms and progress in treatment, Medication management, Plan inpatient admission and medications as below  Observation Level/Precautions:  15 minute checks  Laboratory:  As needed   Psychotherapy:  Milieu, support   Medications:  Continue Prozac 30 mgrs QDAY for depression, anxiety . Vistaril Lourena Simmonds PRNs   Consultations: as needed   Discharge Concerns:   -   Estimated LOS:  Other:     Physician Treatment Plan for Primary Diagnosis:  Suicidal Ideations Long Term Goal(s): Improvement in symptoms so as ready for discharge  Short Term Goals: Ability to verbalize feelings will improve, Ability to disclose and discuss suicidal ideas, Ability to demonstrate self-control will improve, Ability to identify and develop effective coping behaviors will improve and Ability to maintain clinical measurements within normal limits will improve  Physician Treatment Plan for Secondary Diagnosis: Active Problems:   Recurrent major depression-severe ([place])  Long Term Goal(s): Improvement in symptoms so as ready for discharge  Short Term Goals: Ability to verbalize feelings will improve, Ability to disclose and discuss suicidal ideas, Ability to demonstrate self-control will improve, Ability to identify and develop effective coping behaviors will improve and Ability to maintain clinical measurements within normal limits will improve  I certify that inpatient services furnished can reasonably be expected to improve the patient's condition.    Neita Garnet, MD 2/19/201811:35 AM

## 2016-12-08 NOTE — Progress Notes (Signed)
Patient ID: Julie Webb, female   DOB: [birth year], 26 y.o.   MRN: [id number]  Admission note - Patient was voluntarily admitted after an argument with her husband. She reports that she attempted to get to the guns in the home but her husband stopped her. She then attempted to take a handful of pills but spit them out. She reports that her husband would like for her to work day shift instead of nights so that she will be home with him. She has no medical history and reports that she needs to take her medications regularly.  Skin assessment completed. She was oriented to the unit, meal provided and was pleasant and cooperative during admission. Safety maintained with 15 min checks.

## 2016-12-09 NOTE — Progress Notes (Signed)
D: Pt pleasant on approach. Pt appeared anxious during shift assessment. Pt reported 0 depression and 1/10 anxiety this morning. Pt reported that she's sleeping well and appetite is fair. Pt have no concerns at this time. Pt tolerating meds well. No side effects to meds verbalized by pt. A: Medications reviewed with pt. Medications administered as ordered per MD. Verbal support provided. Pt encouraged to attend groups. 15 minute checks performed for safety.  R: Pt receptive to tx.

## 2016-12-09 NOTE — Progress Notes (Signed)
Recreation Therapy Notes  Animal-Assisted Activity (AAA) Program Checklist/Progress Notes Patient Eligibility Criteria Checklist & Daily Group note for Rec TxIntervention  Date: 02.20.2018 Time: 2:45pm Location: [address]    AAA/T Program Assumption of Risk Form signed by Patient/ or Parent Legal Guardian Yes  Patient is free of allergies or sever asthma Yes  Patient reports no fear of animals Yes  Patient reports no history of cruelty to animals Yes  Patient understands his/her participation is voluntary Yes  Patient washes hands before animal contact Yes  Patient washes hands after animal contact Yes  Behavioral Response: Engaged, Appropriate   Education:Hand Washing, Appropriate Animal Interaction   Education Outcome: Acknowledges education.   Clinical Observations/Feedback: Patient attended session and interacted appropriately with therapy dog and peers.    Laureen Ochs Clerance Umland, LRT/CTRS        Joice Nazario L 12/09/2016 3:04 PM

## 2016-12-09 NOTE — Progress Notes (Signed)
D: Pt was in the dayroom upon initial approach.  Pt presents with anxious affect and mood.  She reports her day was "good" and that she had a good visit with her husband, parents, and brother.  When asked about her goal, pt reports she "accomplished it by talking to the doctor to see what my next step is."  Pt reported she felt "stupid" about the events leading to her admission.  Pt denies SI/HI, denies hallucinations, denies pain.  Interactions are cautious.  Pt smiles upon approach.  She attended evening group tonight.  A: Introduced self to pt.  Actively listened to pt and offered support and encouragement. PRN medication administered for anxiety.  Reinforced the need of pt's admission due to the severity of her actions and the ideations she was having prior to admission.  Reassured pt that she is safe and encouraged pt to continue to engage in her treatment at [facility].  Q15 minute safety checks maintained.  R: Pt is safe on the unit.  Pt is compliant with medications.  Pt verbally contracts for safety.

## 2016-12-09 NOTE — BHH Suicide Risk Assessment (Signed)
BHH INPATIENT:  Family/Significant Other Suicide Prevention Education  Suicide Prevention Education:  Education Completed; Julie Webb, Pt's husband [phone], has been identified by the patient as the family member/significant other with whom the patient will be residing, and identified as the person(s) who will aid the patient in the event of a mental health crisis (suicidal ideations/suicide attempt).  With written consent from the patient, the family member/significant other has been provided the following suicide prevention education, prior to the and/or following the discharge of the patient.  The suicide prevention education provided includes the following:  Suicide risk factors  Suicide prevention and interventions  National Suicide Hotline telephone number  [facility] assessment telephone number  [facility] Emergency Assistance [place] and/or Residential Mobile Crisis Unit telephone number  Request made of family/significant other to:  Remove weapons (e.g., guns, rifles, knives), all items previously/currently identified as safety concern.    Remove drugs/medications (over-the-counter, prescriptions, illicit drugs), all items previously/currently identified as a safety concern.  The family member/significant other verbalizes understanding of the suicide prevention education information provided.  The family member/significant other agrees to remove the items of safety concern listed above.  Pt's husband verified removal of firearms and expressed feeling that Pt will be safe to return home.   Gladstone Lighter 12/09/2016, 1:13 PM

## 2016-12-09 NOTE — Plan of Care (Signed)
Problem: Safety: Goal: Periods of time without injury will increase Outcome: Progressing Pt has not harmed self or others tonight.  She denies SI/HI and verbally contracts for safety.  Pt is low fall risk.

## 2016-12-09 NOTE — Progress Notes (Signed)
[facility] MD Progress Note  12/09/2016 2:32 PM Julie Webb  MRN:  [id number] Subjective:  Patient reports she feels she is progressing. She denies any suicidal ideations. She reports a sense of some guilt/shame regarding recent events that led to admission, stating they are very out of character for her. Denies medication side effects. Objective : I have discussed case with treatment team and have met with patient. Patient presents improved compared to admission, still somewhat constricted in affect, but more reactive, and affect improves during session. Denies any lingering or ongoing suicidal ideations, and is future oriented, stating she is hoping to return to work son and expressing interest in going to couples' therapy with her husband. She identifies marital issues as a significant stressor- describes it is difficult for them to spend a lot of time together due to conflicting work schedules and her third shift work, and states she often struggles with guilt about feeling she is neglecting her husband or home due to her long work hours . At her request I spoke with her husband over the phone. He expresses being supportive of patient, looking forward to her returning home soon, and expressing willingness and agreement to start couple's therapy . No disruptive or agitated behaviors on unit, going to some groups. Denies medication side effects. Principal Problem:  MDD, Suicide Attempt Diagnosis:   Patient Active Problem List   Diagnosis Date Noted  . Recurrent major depression-severe ([place]) [F33.2] 12/08/2016   Total Time spent with patient: 25 minutes   Past Medical History:  Past Medical History:  Diagnosis Date  . Anxiety   . Depression    History reviewed. No pertinent surgical history. Family History: History reviewed. No pertinent family history.  Social History:  History  Alcohol Use  . Yes    Comment: occasionally- once a month if then pt reports     History  Drug Use No     Social History   Social History  . Marital status: Married    Spouse name: N/A  . Number of children: N/A  . Years of education: N/A   Social History Main Topics  . Smoking status: Never Smoker  . Smokeless tobacco: Never Used  . Alcohol use Yes     Comment: occasionally- once a month if then pt reports  . Drug use: No  . Sexual activity: Yes    Birth control/ protection: Condom   Other Topics Concern  . None   Social History Narrative  . None   Additional Social History:   Sleep: Good  Appetite:  Good  Current Medications: Current Facility-Administered Medications  Medication Dose Route Frequency Provider Last Rate Last Dose  . acetaminophen (TYLENOL) tablet 650 mg  650 mg Oral Q6H PRN Rozetta Nunnery, NP      . alum & mag hydroxide-simeth (MAALOX/MYLANTA) 200-200-20 MG/5ML suspension 30 mL  30 mL Oral Q4H PRN Rozetta Nunnery, NP      . FLUoxetine (PROZAC) capsule 30 mg  30 mg Oral Daily Rozetta Nunnery, NP   30 mg at 12/09/16 0813  . hydrOXYzine (ATARAX/VISTARIL) tablet 25 mg  25 mg Oral TID PRN Rozetta Nunnery, NP      . LORazepam (ATIVAN) tablet 0.5 mg  0.5 mg Oral Daily PRN Rozetta Nunnery, NP   0.5 mg at 12/08/16 2110  . magnesium hydroxide (MILK OF MAGNESIA) suspension 30 mL  30 mL Oral Daily PRN Rozetta Nunnery, NP      . traZODone (DESYREL) tablet  50 mg  50 mg Oral QHS PRN Rozetta Nunnery, NP        Lab Results:  Results for orders placed or performed during the hospital encounter of 12/08/16 (from the past 48 hour(s))  TSH     Status: None   Collection Time: 12/08/16  6:05 AM  Result Value Ref Range   TSH 0.968 0.350 - 4.500 uIU/mL    Comment: Performed by a 3rd Generation assay with a functional sensitivity of <=0.01 uIU/mL. Performed at [facility], [place] [address], [place], [place] [address]   Lipid panel     Status: None   Collection Time: 12/08/16  6:05 AM  Result Value Ref Range   Cholesterol 158 0 - 200 mg/dL   Triglycerides 88 <150 mg/dL    HDL 66 >40 mg/dL   Total CHOL/HDL Ratio 2.4 RATIO   VLDL 18 0 - 40 mg/dL   LDL Cholesterol 74 0 - 99 mg/dL    Comment:        Total Cholesterol/HDL:CHD Risk Coronary Heart Disease Risk Table                     Men   Women  1/2 Average Risk   3.4   3.3  Average Risk       5.0   4.4  2 X Average Risk   9.6   7.1  3 X Average Risk  23.4   11.0        Use the calculated Patient Ratio above and the CHD Risk Table to determine the patient's CHD Risk.        ATP III CLASSIFICATION (LDL):  <100     mg/dL   Optimal  100-129  mg/dL   Near or Above                    Optimal  130-159  mg/dL   Borderline  160-189  mg/dL   High  >190     mg/dL   Very High Performed at [facility] [address] [place], [place] [address]     Blood Alcohol level:  Lab Results  Component Value Date   ETH <5 75/91/6384    Metabolic Disorder Labs: No results found for: HGBA1C, MPG No results found for: PROLACTIN Lab Results  Component Value Date   CHOL 158 12/08/2016   TRIG 88 12/08/2016   HDL 66 12/08/2016   CHOLHDL 2.4 12/08/2016   VLDL 18 12/08/2016   LDLCALC 74 12/08/2016    Physical Findings: AIMS: Facial and Oral Movements Muscles of Facial Expression: None, normal Lips and Perioral Area: None, normal Jaw: None, normal Tongue: None, normal,Extremity Movements Upper (arms, wrists, hands, fingers): None, normal Lower (legs, knees, ankles, toes): None, normal, Trunk Movements Neck, shoulders, hips: None, normal, Overall Severity Severity of abnormal movements (highest score from questions above): None, normal Incapacitation due to abnormal movements: None, normal Patient's awareness of abnormal movements (rate only patient's report): No Awareness, Dental Status Current problems with teeth and/or dentures?: No Does patient usually wear dentures?: No  CIWA:  CIWA-Ar Total: 1 COWS:  COWS Total Score: 2  Musculoskeletal: Strength & Muscle Tone: within normal limits Gait &  Station: normal Patient leans: N/A  Psychiatric Specialty Exam: Physical Exam  ROS no  nausea, no vomiting,no fever, no chills   Blood pressure (!) 90/57, pulse (!) 102, temperature 98 F (36.7 C), temperature source Oral, resp. rate 16, height 5'  4" (1.626 m), weight 53.1 kg (117 lb), last menstrual period 12/04/2016.Body mass index is 20.08 kg/m.  General Appearance: Well Groomed  Eye Contact:  Good  Speech:  Normal Rate  Volume:  Normal  Mood:  improved, still mildly depressed   Affect:  less constricted, more reactive  Thought Process:  Linear and Descriptions of Associations: Intact  Orientation:  Full (Time, Place, and Person)  Thought Content:  no hallucinations, no delusions, not internally preoccupied   Suicidal Thoughts:  No denies any suicidal or self injurious ideations, denies any homicidal or violent ideations   Homicidal Thoughts:  No  Memory:  recent and remote grossly intact   Judgement:  Other:  improving   Insight:  improving   Psychomotor Activity:  Normal  Concentration:  Concentration: Good and Attention Span: Good  Recall:  Good  Fund of Knowledge:  Good  Language:  Good  Akathisia:  Negative  Handed:  Right  AIMS (if indicated):     Assets:  Communication Skills Desire for Improvement Resilience Social Support Vocational/Educational  ADL's:  Intact  Cognition:  WNL  Sleep:  Number of Hours: 6.75   Assessment - patient presents with partial improvement of mood, affect, and denies any lingering suicidal ideations. She is future oriented. Identifies marital stressors as a significant issue , both patient and husband express desire to work on relationship and go to couples therapy sessions after she is discharged from unit. Tolerating medications well .  Treatment Plan Summary: Daily contact with patient to assess and evaluate symptoms and progress in treatment, Medication management, Plan inpatient treatment and medications as below Encourage group and  milieu participation to work on coping skills and symptom reduction. Continue Prozac 30 mgrs QAM for depression, anxiety Continue Vistaril 25 mgrs Q 8 hours PRN for anxiety as needed  Continue Trazodone 50 mgrs QHS PRN for insomnia as needed  Treatment team working on disposition planning options   Neita Garnet, MD 12/09/2016, 2:32 PM

## 2016-12-09 NOTE — Progress Notes (Signed)
  [facility] Adult Case Management Discharge Plan :  Will you be returning to the same living situation after discharge:  Yes,  Pt returning home with family At discharge, do you have transportation home?: Yes,  Pt husband to pick up Do you have the ability to pay for your medications: Yes,  Pt provided with prescriptions  Release of information consent forms completed and in the chart;  Patient's signature needed at discharge.  Patient to Follow up at: Follow-up Information    [facility] Follow up on 12/15/2016.   Specialty:  Behavioral Health Why:  at 2:40pm with Dr. Creig Hines for medication management. At this appointment, Dr. Creig Hines will speak with you about scheduling you with a therapist in their practice.  Contact information: [place]  [phone]           Next level of care provider has access to [place] and Suicide Prevention discussed: Yes,  with husband; see SPE note  Have you used any form of tobacco in the last 30 days? (Cigarettes, Smokeless Tobacco, Cigars, and/or Pipes): No  Has patient been referred to the Quitline?: N/A patient is not a smoker  Patient has been referred for addiction treatment: Yes  Julie Webb 12/09/2016, 3:44 PM

## 2016-12-09 NOTE — BHH Group Notes (Signed)
[place] LCSW Group Therapy 12/09/2016 1:15 PM  Type of Therapy: Group Therapy- Feelings about Diagnosis  Participation Level: Active   Participation Quality:  Appropriate  Affect:  Appropriate  Cognitive: Alert and Oriented   Insight:  Developing   Engagement in Therapy: Developing/Improving and Engaged   Modes of Intervention: Clarification, Confrontation, Discussion, Education, Exploration, Limit-setting, Orientation, Problem-solving, Rapport Building, Art therapist, Socialization and Support  Description of Group:   This group will allow patients to explore their thoughts and feelings about diagnoses they have received. Patients will be guided to explore their level of understanding and acceptance of these diagnoses. Facilitator will encourage patients to process their thoughts and feelings about the reactions of others to their diagnosis, and will guide patients in identifying ways to discuss their diagnosis with significant others in their lives. This group will be process-oriented, with patients participating in exploration of their own experiences as well as giving and receiving support and challenge from other group members.  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  Therapeutic Modalities:   Cognitive Behavioral Therapy Solution Focused Therapy Motivational Interviewing Relapse Prevention Therapy  Adriana Reams, LCSW 12/09/2016 4:44 PM

## 2016-12-09 NOTE — Progress Notes (Signed)
D: Pt was in her room with visitors upon initial approach.  Pt presents with anxious affect and anxious, pleasant mood.  She smiles upon approach.  Pt reports her day was "really good" and she worked on "discharge planning."  Pt reports she is discharging tomorrow and she feels safe to do so.  When asked what she will do differently in the future, she reports that if she and her husband argue, they may "go to a public place."  Pt reports she will take her medication regularly.  Pt plans to "stay with my mother-in-law for a couple days" after discharge.  Pt denies SI/HI, denies hallucinations, denies pain.  Pt has been visible in milieu interacting with peers and staff appropriately.  Pt attended evening group.    A: Actively listened to pt and offered support and encouragement.  PRN medication administered for anxiety.  Q15 minute safety checks maintained.  R: Pt is safe on the unit.  Pt is compliant with medications.  Pt verbally contracts for safety.

## 2016-12-09 NOTE — BHH Group Notes (Signed)
[facility] Group Notes:  (Nursing/MHT/Case Management/Adjunct)  Date:  12/09/2016  Time:  930am  Type of Therapy:  Nurse Education  Participation Level:  Did not attend  Participation Quality:   Affect:   Cognitive:    Insight:    Engagement in Group:  Did not attend group  Modes of Intervention:  Discussion and Education  Summary of Progress/Problems:  Today's topic was Recovery.  Julie Webb chose not to attend group.  Julie Webb 12/09/2016, 10:56 AM

## 2016-12-10 DIAGNOSIS — T1491XA Suicide attempt, initial encounter: Secondary | ICD-10-CM

## 2016-12-10 MED ORDER — FLUOXETINE HCL 10 MG PO CAPS: 30.0000 mg | ORAL_CAPSULE | Freq: Every day | ORAL | 0 refills | Status: DC

## 2016-12-10 MED ORDER — HYDROXYZINE HCL 25 MG PO TABS: 25.0000 mg | ORAL_TABLET | Freq: Three times a day (TID) | ORAL | 0 refills | Status: DC | PRN

## 2016-12-10 MED ORDER — TRAZODONE HCL 50 MG PO TABS: 50.0000 mg | ORAL_TABLET | Freq: Every evening | ORAL | 0 refills | Status: DC | PRN

## 2016-12-10 NOTE — Progress Notes (Signed)
Pt received both written and verbal discharge instructions. Pt verbalized understanding of discharge instructions. Pt agreed to f/u appt and med regimen. Pt received prescriptions and discharge papers. Pt gathered belongings from room and locker. Pt safely discharged to lobby.

## 2016-12-10 NOTE — BHH Suicide Risk Assessment (Signed)
[facility] Discharge Suicide Risk Assessment   Principal Problem:  Major Depression  Discharge Diagnoses:  Patient Active Problem List   Diagnosis Date Noted  . Recurrent major depression-severe ([place]) [F33.2] 12/08/2016    Total Time spent with patient: 30 minutes  Musculoskeletal: Strength & Muscle Tone: within normal limits Gait & Station: normal Patient leans: N/A  Psychiatric Specialty Exam: ROS no headache, no nausea, no vomiting, no fever, no chills   Blood pressure 109/71, pulse (!) 112, temperature 97.6 F (36.4 C), temperature source Oral, resp. rate 16, height 5\' 4"  (1.626 m), weight 53.1 kg (117 lb), last menstrual period 12/04/2016.Body mass index is 20.08 kg/m.  General Appearance: Well Groomed  Eye Contact::  Good  Speech:  Normal Rate409  Volume:  Normal  Mood:  improved mood, less depressed   Affect:  Appropriate and more reactive   Thought Process:  Linear  Orientation:  Full (Time, Place, and Person)  Thought Content:  No hallucinations, no delusions   Suicidal Thoughts:  No suicidal or self injurious ideations, no self injurious ideations  Homicidal Thoughts:  No denies any homicidal or violent ideations   Memory:  recent and remote grossly intact   Judgement:  Other:  improving   Insight:  improving  Psychomotor Activity:  Normal  Concentration:  Good  Recall:  NA  Fund of Knowledge:Good  Language: Good  Akathisia:  Negative  Handed:  Right  AIMS (if indicated):     Assets:  Communication Skills Desire for Improvement Resilience  Sleep:  Number of Hours: 6.75  Cognition: WNL  ADL's:  Intact   Mental Status Per Nursing Assessment::   On Admission:  Suicidal ideation indicated by patient  Demographic Factors:  26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, no children, RN, employed   Loss Factors: Marital stressors which she states are related in part to her working night shift and therefore having less time to spend with husband  Father had MI a year ago, parents  have financial difficulties   Historical Factors: History of depression, no prior suicidal attempts, no history of self cutting  Risk Reduction Factors:   Sense of responsibility to family, Employed, Living with another person, especially a relative, Positive social support and Positive coping skills or problem solving skills  Continued Clinical Symptoms:  At this time patient is alert, attentive, well related, pleasant, mood is improved, affect is reactive, no thought disorder, no suicidal or self injurious ideations, no homicidal or violent ideations, no hallucinations, no delusions, future oriented . Denies medication side effects.  Prior to discharge we had family meeting with husband, patient, Probation officer. He is supportive and they are both interested in couples therapy to work on their relationship and communication patterns. Husband states all firearms have been locked away.   Cognitive Features That Contribute To Risk:  No gross cognitive deficits noted upon discharge. Is alert , attentive, and oriented x 3   Suicide Risk:  Mild:  Suicidal ideation of limited frequency, intensity, duration, and specificity.  There are no identifiable plans, no associated intent, mild dysphoria and related symptoms, good self-control (both objective and subjective assessment), few other risk factors, and identifiable protective factors, including available and accessible social support.  Follow-up Information    [facility] Follow up on 12/15/2016.   Specialty:  Behavioral Health Why:  at 2:40pm with Dr. Creig Hines for medication management. At this appointment, Dr. Creig Hines will speak with you about scheduling you with a therapist in their practice.  Contact information: [place]  [place] [id number] [phone]           Plan Of Care/Follow-up recommendations:  Activity:  as tolerated Diet:  Regular Tests:  NA Other:  NA  Patient is discharging in good  spirits  Plans to return home  Husband is picking her up today Plans to follow up as above   Neita Garnet, MD 12/10/2016, 12:18 PM

## 2016-12-10 NOTE — Progress Notes (Signed)
Recreation Therapy Notes  Date: 12/10/16 Time: 0930 Location: [address] Group Room  Group Topic: Stress Management  Goal Area(s) Addresses:  Patient will verbalize importance of using healthy stress management.  Patient will identify positive emotions associated with healthy stress management.   Intervention: Stress Management  Activity :  Meditation.  LRT introduced the stress management technique of meditation.  LRT played a meditation from the Calm app to allows patients to engage and learn the benefits of meditation.  Patiens were to follow along as the meditation played to engage in the technique.  Education:  Stress Management, Discharge Planning.   Education Outcome: Acknowledges edcuation/In group clarification offered/Needs additional education  Clinical Observations/Feedback: Pt did not attend group.    Victorino Sparrow, LRT/CTRS         Victorino Sparrow A 12/10/2016 12:49 PM

## 2016-12-11 MED ORDER — TRAZODONE HCL 50 MG PO TABS: 50.0000 mg | ORAL_TABLET | Freq: Every evening | ORAL | 0 refills | Status: DC | PRN

## 2016-12-11 MED ORDER — HYDROXYZINE HCL 25 MG PO TABS: 25.0000 mg | ORAL_TABLET | Freq: Three times a day (TID) | ORAL | 0 refills | Status: DC | PRN

## 2016-12-11 MED ORDER — FLUOXETINE HCL 10 MG PO CAPS: 30.0000 mg | ORAL_CAPSULE | Freq: Every day | ORAL | 0 refills | Status: DC

## 2016-12-11 NOTE — Discharge Summary (Signed)
Physician Discharge Summary Note  Patient:  Julie Webb is an 26 y.o., female  MRN:  [id number]  DOB:  February 03, 1991  Patient phone:  [phone] (home)   Patient address:   [address] [place] [address],   Total Time spent with patient: Greater than 30 minutes  Date of Admission:  12/08/2016 Date of Discharge: 12-11-16  Reason for Admission: Suicide attempt by several means (OD/shooting).  Principal Problem: Major depressive disorder, recurrent episodes.  Discharge Diagnoses: Patient Active Problem List   Diagnosis Date Noted  . Suicidal behavior with attempted self-injury [T14.91XA]   . Recurrent major depression-severe ([facility]) [F33.2] 12/08/2016   Past Psychiatric History: Major depressive disorder, recurrent episodes.  Past Medical History:  Past Medical History:  Diagnosis Date  . Anxiety   . Depression    History reviewed. No pertinent surgical history. Family History: History reviewed. No pertinent family history.  Family Psychiatric  History: See H&P  Social History:  History  Alcohol Use  . Yes    Comment: occasionally- once a month if then pt reports     History  Drug Use No    Social History   Social History  . Marital status: Married    Spouse name: N/A  . Number of children: N/A  . Years of education: N/A   Social History Main Topics  . Smoking status: Never Smoker  . Smokeless tobacco: Never Used  . Alcohol use Yes     Comment: occasionally- once a month if then pt reports  . Drug use: No  . Sexual activity: Yes    Birth control/ protection: Condom   Other Topics Concern  . None   Social History Narrative  . None   Hospital Course: 26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. She is an Therapist, sports, works night time shift. She reports recent argument with her husband, regarding issues related to not seeing each other often due to her shift work. States that " he was acting poorly, he was loud, very emotional". States that during the argument, she  became acutely suicidal, without any prior planning and in quick succession she impulsively took a shot gun to shoot self , which she states she could not operate because had a trigger lock, then tried to pull out a hand gun, which her husband took from her, and then tried to overdose on medications, which were also removed by husband ( did not actually swallow any ). 911 was contacted and she was brought to hospital. States that even before this incident she had been feeling somewhat depressed, but states it was relatively mild. She denies any recent suicidal ideations, and states her suicidal ideations were impulsive, unplanned, and related to argument.   Julie Webb was admitted to the hospital for crisis management due to suicide attempts after an argument with husband. She reported on admission that this is an impulsive behavior & not a prior planned incident. She also reported that her suicidal thoughts were rather instant & not insidious, a spur of the moment kind of incident or event. She did admit to feeling mildly depressed over the recent weeks.   After her admission assessment, Julie Webb was started on medication regimen for the mild depression. She was medicated & discharged on; Fluoxetine 10 mg for depression, Hydroxyzine 25 mg prn for anxiety & Trazodone 50 mg for insomnia. She was also enrolled in the group counseling sessions being offered & held on this unit. She learned coping skills that should help her cope better &  maintain mood stability after diacharge. She presented no other significant health issues that required treatment. She tolerated her treatment regimen without any adverse effects or reactions reported.  Julie Webb's symptoms responded well to her treatment regimen. This is evidenced by her reports of improved mood & absence of suicidal ideations. She is currently mentally & medically stable to be discharged to continue psychiatric treatment on an outpatient basis as noted below. She is  provided with all the necessary information needed to make this appointment without problems.  Upon discharge, Julie Webb adamantly denies any SIHI, AVH, delusional thoughts or paranoia. She left [facility] with all personal belongings in no apparent distress. Transportation per husband.  Physical Findings: AIMS: Facial and Oral Movements Muscles of Facial Expression: None, normal Lips and Perioral Area: None, normal Jaw: None, normal Tongue: None, normal,Extremity Movements Upper (arms, wrists, hands, fingers): None, normal Lower (legs, knees, ankles, toes): None, normal, Trunk Movements Neck, shoulders, hips: None, normal, Overall Severity Severity of abnormal movements (highest score from questions above): None, normal Incapacitation due to abnormal movements: None, normal Patient's awareness of abnormal movements (rate only patient's report): No Awareness, Dental Status Current problems with teeth and/or dentures?: No Does patient usually wear dentures?: No  CIWA:  CIWA-Ar Total: 1 COWS:  COWS Total Score: 2  Musculoskeletal: Strength & Muscle Tone: within normal limits Gait & Station: normal Patient leans: N/A  Psychiatric Specialty Exam: Physical Exam  Constitutional: She appears well-developed.  HENT:  Head: Normocephalic.  Eyes: Pupils are equal, round, and reactive to light.  Neck: Normal range of motion.  Cardiovascular: Normal rate.   Respiratory: Effort normal.  GI: Soft.  Genitourinary:  Genitourinary Comments: Deferred  Musculoskeletal: Normal range of motion.  Neurological: She is alert.  Skin: Skin is warm.    Review of Systems  Constitutional: Negative.   HENT: Negative.   Eyes: Negative.   Respiratory: Negative.   Cardiovascular: Negative.   Gastrointestinal: Negative.   Genitourinary: Negative.   Musculoskeletal: Negative.   Skin: Negative.   Neurological: Negative.   Endo/Heme/Allergies: Negative.   Psychiatric/Behavioral: Positive for depression  (Stable). Negative for hallucinations, memory loss, substance abuse and suicidal ideas. The patient has insomnia (Stable). The patient is not nervous/anxious.     Blood pressure 109/71, pulse (!) 112, temperature 97.6 F (36.4 C), temperature source Oral, resp. rate 16, height 5\' 4"  (1.626 m), weight 53.1 kg (117 lb), last menstrual period 12/04/2016.Body mass index is 20.08 kg/m.  See Md's SRA   Have you used any form of tobacco in the last 30 days? (Cigarettes, Smokeless Tobacco, Cigars, and/or Pipes): No  Has this patient used any form of tobacco in the last 30 days? (Cigarettes, Smokeless Tobacco, Cigars, and/or Pipes): No  Blood Alcohol level:  Lab Results  Component Value Date   ETH <5 [id number]   Metabolic Disorder Labs:  No results found for: HGBA1C, MPG No results found for: PROLACTIN Lab Results  Component Value Date   CHOL 158 12/08/2016   TRIG 88 12/08/2016   HDL 66 12/08/2016   CHOLHDL 2.4 12/08/2016   VLDL 18 12/08/2016   LDLCALC 74 12/08/2016   See Psychiatric Specialty Exam and Suicide Risk Assessment completed by Attending Physician prior to discharge.  Discharge destination:  Home  Is patient on multiple antipsychotic therapies at discharge:  No   Has Patient had three or more failed trials of antipsychotic monotherapy by history:  No  Recommended Plan for Multiple Antipsychotic Therapies: NA  Allergies as of 12/10/2016   No  Known Allergies     Medication List    STOP taking these medications   acetaminophen 500 MG tablet Commonly known as:  TYLENOL   GABAPENTIN PO   LORazepam 0.5 MG tablet Commonly known as:  ATIVAN     TAKE these medications     Indication  FLUoxetine 10 MG capsule Commonly known as:  PROZAC Take 3 capsules (30 mg total) by mouth daily. For depression What changed:  additional instructions  Indication:  Major Depressive Disorder, Obsessive Compulsive Disorder   hydrOXYzine 25 MG tablet Commonly known as:   ATARAX/VISTARIL Take 1 tablet (25 mg total) by mouth 3 (three) times daily as needed for anxiety.  Indication:  Anxiety Neurosis, anxiety   traZODone 50 MG tablet Commonly known as:  DESYREL Take 1 tablet (50 mg total) by mouth at bedtime as needed for sleep.  Indication:  Trouble Sleeping      Follow-up Information    [facility] Follow up on 12/15/2016.   Specialty:  Behavioral Health Why:  at 2:40pm with Dr. Creig Hines for medication management. At this appointment, Dr. Creig Hines will speak with you about scheduling you with a therapist in their practice.  Contact information: [place] [address] [phone]          Follow-up recommendations: Activity:  As tolerated Diet: As recommended by your primary care doctor. Keep all scheduled follow-up appointments as recommended.  Comments: Patient is instructed prior to discharge to: Take all medications as prescribed by his/her mental healthcare provider. Report any adverse effects and or reactions from the medicines to his/her outpatient provider promptly. Patient has been instructed & cautioned: To not engage in alcohol and or illegal drug use while on prescription medicines. In the event of worsening symptoms, patient is instructed to call the crisis hotline, 911 and or go to the nearest ED for appropriate evaluation and treatment of symptoms. To follow-up with his/her primary care provider for your other medical issues, concerns and or health care needs.   Signed: Encarnacion Slates, NP, PMHNP, FNP-BC 12/11/2016, 11:52 AM

## 2016-12-12 ENCOUNTER — Encounter (HOSPITAL_BASED_OUTPATIENT_CLINIC_OR_DEPARTMENT_OTHER): Payer: Self-pay | Admitting: *Deleted

## 2016-12-15 DIAGNOSIS — F410 Panic disorder [episodic paroxysmal anxiety] without agoraphobia: Secondary | ICD-10-CM | POA: Diagnosis not present

## 2016-12-15 MED FILL — GABAPENTIN 100 MG CAPSULE: 100 | 30 days supply | Qty: 120 | Fill #0

## 2016-12-17 DIAGNOSIS — F410 Panic disorder [episodic paroxysmal anxiety] without agoraphobia: Secondary | ICD-10-CM | POA: Diagnosis not present

## 2017-01-01 DIAGNOSIS — F410 Panic disorder [episodic paroxysmal anxiety] without agoraphobia: Secondary | ICD-10-CM | POA: Diagnosis not present

## 2017-01-14 DIAGNOSIS — F410 Panic disorder [episodic paroxysmal anxiety] without agoraphobia: Secondary | ICD-10-CM | POA: Diagnosis not present

## 2017-01-27 MED FILL — traZODone HCL 50 MG TABS: 50 | 30 days supply | Qty: 30 | Fill #0

## 2017-01-29 DIAGNOSIS — F410 Panic disorder [episodic paroxysmal anxiety] without agoraphobia: Secondary | ICD-10-CM | POA: Diagnosis not present

## 2017-02-05 DIAGNOSIS — F410 Panic disorder [episodic paroxysmal anxiety] without agoraphobia: Secondary | ICD-10-CM | POA: Diagnosis not present

## 2017-02-10 MED FILL — GABAPENTIN 100 MG CAPSULE: 100 | 30 days supply | Qty: 120 | Fill #1

## 2017-02-10 MED FILL — BUPROPION HCL XL 150 MG TAB: 150 | 30 days supply | Qty: 30 | Fill #0

## 2017-02-11 DIAGNOSIS — F410 Panic disorder [episodic paroxysmal anxiety] without agoraphobia: Secondary | ICD-10-CM | POA: Diagnosis not present

## 2017-03-04 DIAGNOSIS — F410 Panic disorder [episodic paroxysmal anxiety] without agoraphobia: Secondary | ICD-10-CM | POA: Diagnosis not present

## 2017-03-18 MED FILL — BUPROPION HCL XL 150 MG TAB: 150 | 30 days supply | Qty: 30 | Fill #1

## 2017-03-18 MED FILL — GABAPENTIN 100 MG CAPSULE: 100 | 30 days supply | Qty: 120 | Fill #2

## 2017-04-29 MED FILL — BUPROPION HCL XL 150 MG TAB: 150 | 30 days supply | Qty: 30 | Fill #2

## 2017-05-13 DIAGNOSIS — F410 Panic disorder [episodic paroxysmal anxiety] without agoraphobia: Secondary | ICD-10-CM | POA: Diagnosis not present

## 2017-05-15 DIAGNOSIS — F410 Panic disorder [episodic paroxysmal anxiety] without agoraphobia: Secondary | ICD-10-CM | POA: Diagnosis not present

## 2017-06-01 MED FILL — FLUoxetine HCL 10 MG CAPS: 10 | 30 days supply | Qty: 90 | Fill #0

## 2017-06-01 MED FILL — GABAPENTIN 100 MG CAP: 100 | 30 days supply | Qty: 120 | Fill #3

## 2017-06-01 MED FILL — buPROPion HCL ER (XL) 300 M: 300 | 30 days supply | Qty: 30 | Fill #0

## 2017-06-11 DIAGNOSIS — F410 Panic disorder [episodic paroxysmal anxiety] without agoraphobia: Secondary | ICD-10-CM | POA: Diagnosis not present

## 2017-06-30 DIAGNOSIS — F410 Panic disorder [episodic paroxysmal anxiety] without agoraphobia: Secondary | ICD-10-CM | POA: Diagnosis not present

## 2017-07-17 MED FILL — FLUoxetine HCL 10 MG CAPS: 10 | 30 days supply | Qty: 90 | Fill #1

## 2017-07-17 MED FILL — BUPROPION HCL XL 300 MG TAB: 300 | 30 days supply | Qty: 30 | Fill #1

## 2017-09-03 MED FILL — FLUoxetine HCL 10 MG CAPS: 10 | 30 days supply | Qty: 90 | Fill #2

## 2017-09-03 MED FILL — BUPROPION HCL XL 300 MG TAB: 300 | 30 days supply | Qty: 30 | Fill #2

## 2017-09-03 MED FILL — GABAPENTIN 100 MG CAP: 100 | 30 days supply | Qty: 120 | Fill #4

## 2017-11-02 ENCOUNTER — Emergency Department (HOSPITAL_COMMUNITY): Payer: No Typology Code available for payment source

## 2017-11-02 ENCOUNTER — Encounter (HOSPITAL_COMMUNITY): Payer: Self-pay | Admitting: Emergency Medicine

## 2017-11-02 ENCOUNTER — Other Ambulatory Visit: Payer: Self-pay

## 2017-11-02 ENCOUNTER — Emergency Department (HOSPITAL_COMMUNITY)
Admission: EM | Admit: 2017-11-02 | Discharge: 2017-11-02 | Disposition: A | Discharge status: Home / Self Care | Payer: No Typology Code available for payment source | Attending: Emergency Medicine | Admitting: Emergency Medicine

## 2017-11-02 DIAGNOSIS — G43909 Migraine, unspecified, not intractable, without status migrainosus: Secondary | ICD-10-CM | POA: Insufficient documentation

## 2017-11-02 DIAGNOSIS — R55 Syncope and collapse: Secondary | ICD-10-CM | POA: Insufficient documentation

## 2017-11-02 DIAGNOSIS — Z79899 Other long term (current) drug therapy: Secondary | ICD-10-CM | POA: Diagnosis not present

## 2017-11-02 LAB — CBC WITH DIFFERENTIAL/PLATELET
Basophils Absolute: 0 10*3/uL (ref 0.0–0.1)
Basophils Relative: 1 %
EOS ABS: 0.1 10*3/uL (ref 0.0–0.7)
EOS PCT: 1 %
HCT: 39.8 % (ref 36.0–46.0)
Hemoglobin: 13.7 g/dL (ref 12.0–15.0)
LYMPHS ABS: 4 10*3/uL (ref 0.7–4.0)
Lymphocytes Relative: 53 %
MCH: 30.3 pg (ref 26.0–34.0)
MCHC: 34.4 g/dL (ref 30.0–36.0)
MCV: 88.1 fL (ref 78.0–100.0)
MONOS PCT: 7 %
Monocytes Absolute: 0.5 10*3/uL (ref 0.1–1.0)
Neutro Abs: 2.8 10*3/uL (ref 1.7–7.7)
Neutrophils Relative %: 38 %
PLATELETS: 216 10*3/uL (ref 150–400)
RBC: 4.52 MIL/uL (ref 3.87–5.11)
RDW: 12.5 % (ref 11.5–15.5)
WBC: 7.3 10*3/uL (ref 4.0–10.5)

## 2017-11-02 LAB — BASIC METABOLIC PANEL
Anion gap: 11 (ref 5–15)
BUN: 11 mg/dL (ref 6–20)
CO2: 21 mmol/L — ABNORMAL LOW (ref 22–32)
Calcium: 9.5 mg/dL (ref 8.9–10.3)
Chloride: 103 mmol/L (ref 101–111)
Creatinine, Ser: 0.81 mg/dL (ref 0.44–1.00)
GFR calc Af Amer: >60 mL/min (ref >60)
Glucose, Bld: 134 mg/dL — ABNORMAL HIGH (ref 65–99)
Potassium: 3.6 mmol/L (ref 3.5–5.1)
SODIUM: 135 mmol/L (ref 135–145)

## 2017-11-02 LAB — RAPID URINE DRUG SCREEN, HOSP PERFORMED
AMPHETAMINES: NOT DETECTED
Barbiturates: NOT DETECTED
Benzodiazepines: NOT DETECTED
Cocaine: NOT DETECTED
Opiates: NOT DETECTED
Tetrahydrocannabinol: NOT DETECTED

## 2017-11-02 LAB — I-STAT BETA HCG BLOOD, ED (MC, WL, AP ONLY): I-stat hCG, quantitative: <5 m[IU]/mL (ref <5)

## 2017-11-02 LAB — TROPONIN I
Troponin I: <0.03 ng/mL (ref <0.03)
Troponin I: <0.03 ng/mL (ref <0.03)

## 2017-11-02 LAB — URINALYSIS, ROUTINE W REFLEX MICROSCOPIC
Bilirubin Urine: NEGATIVE
GLUCOSE, UA: NEGATIVE mg/dL
Hgb urine dipstick: NEGATIVE
KETONES UR: 5 mg/dL — AB
Leukocytes, UA: NEGATIVE
Nitrite: NEGATIVE
PH: 8 (ref 5.0–8.0)
Protein, ur: 30 mg/dL — AB
RBC / HPF: NONE SEEN RBC/hpf (ref 0–5)
Specific Gravity, Urine: 1.02 (ref 1.005–1.030)
Squamous Epithelial / LPF: NONE SEEN

## 2017-11-02 LAB — CBG MONITORING, ED: GLUCOSE-CAPILLARY: 91 mg/dL (ref 65–99)

## 2017-11-02 MED ORDER — SODIUM CHLORIDE 0.9 % IV BOLUS (SEPSIS)
1000.0000 mL | Freq: Once | INTRAVENOUS | Status: AC
  Administered 2017-11-02: 1000 mL via INTRAVENOUS

## 2017-11-02 MED ORDER — METOCLOPRAMIDE HCL 5 MG/ML IJ SOLN
10.0000 mg | Freq: Once | INTRAMUSCULAR | Status: AC
  Administered 2017-11-02: 10 mg via INTRAVENOUS
  Filled 2017-11-02: qty 2

## 2017-11-02 MED ORDER — METHYLPREDNISOLONE SODIUM SUCC 125 MG IJ SOLR
125.0000 mg | Freq: Once | INTRAMUSCULAR | Status: AC
  Administered 2017-11-02: 125 mg via INTRAVENOUS
  Filled 2017-11-02: qty 2

## 2017-11-02 MED ORDER — KETOROLAC TROMETHAMINE 30 MG/ML IJ SOLN
30.0000 mg | Freq: Once | INTRAMUSCULAR | Status: AC
  Administered 2017-11-02: 30 mg via INTRAVENOUS
  Filled 2017-11-02: qty 1

## 2017-11-02 NOTE — ED Notes (Signed)
Patient transported to CT 

## 2017-11-02 NOTE — ED Triage Notes (Signed)
Pt brought by [facility]. Felt a migraine coming on. Shortly after, pt was found passed out by staff members. Pt clammy, diaphoretic, weak.

## 2017-11-02 NOTE — ED Notes (Signed)
Pt has no needs; wants to rest

## 2017-11-02 NOTE — Discharge Instructions (Signed)
Your lab results showed no acute abnormalities other than some evidence of minor dehydration. This was addressed with IV fluids.  For future headaches please try the following regimen: Antiinflammatory medications: If you are able to take NSAIDS, you may take 600 mg of ibuprofen every 6 hours or 440 mg (over the counter dose) to 500 mg (prescription dose) of naproxen every 12 hours for the next 3 days. After this time, these medications may be used as needed for pain. Take these medications with food to avoid upset stomach. Choose only one of these medications, do not take them together. Tylenol: Should you continue to have additional pain while taking the ibuprofen or naproxen, you may add in tylenol as needed. Your daily total maximum amount of tylenol from all sources should be limited to 4000mg /day for persons without liver problems, or 2000mg /day for those with liver problems.  Hydration: Have a goal of about a half liter of water every couple hours to stay well hydrated.   Sleep: Please be sure to get plenty of sleep with a goal of 8 hours per night. Having a regular bed time and bedtime routine can help with this.  Screens: Reduce the amount of time you are in front of screens.  Take about a 5-10-minute break every hour or every couple hours to give your eyes rest.  Do not use screens in dark rooms.  Glasses with a blue light filter may also help reduce eye fatigue.  Stress: Take steps to reduce stress as much as possible.   Follow up: Follow-up with your primary care provider on this issue (the headache and the syncope).  May also need to follow-up with the neurologist if you are having increased frequency of headaches.

## 2017-11-02 NOTE — ED Provider Notes (Signed)
[facility] EMERGENCY DEPARTMENT Provider Note   CSN: [id number] Arrival date & time: 11/02/17  4235     History   Chief Complaint Chief Complaint  Patient presents with  . Loss of Consciousness  . Migraine    HPI Julie Webb is a 27 y.o. female.  HPI   Julie Webb is a 27 y.o. female, with a history of anxiety, depression, and migraines, presenting to the ED with syncope. 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 Began to feel a headache, bilateral, temporal, 8/10, nonradiating. States it feels like a typical migraine for her. Took Excedrin, headache now 6/10. Accompanied by nausea.  States, "About 30 minutes after the headache came on, I passed out." Patient states she has been eating regularly throughout her shift. LMP last week. Denies chest pain, shortness of breath, abdominal pain, vomiting, diarrhea, dizziness, numbness, unilateral weakness, vision abnormalities, recent illness, or any other complaints. Patient has a PCP.     Past Medical History:  Diagnosis Date  . Anxiety   . Depression   . Glomerulonephritis, poststreptococcal    age 89  . Migraines     Patient Active Problem List   Diagnosis Date Noted  . Suicidal behavior with attempted self-injury ([place])   . 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 Tremor 10/05/2015  . Loss of weight 07/06/2015  . Anxiety and depression 05/30/2015  . H/O acute post-streptococcal glomerulonephritis 05/30/2015  . Contraceptive management 05/30/2015  . Migraine 05/30/2015    History reviewed. No pertinent surgical history.  OB History    No data available       Home Medications    Prior to Admission medications   Medication Sig Start Date End Date Taking? Authorizing Provider  gabapentin (NEURONTIN) 100 MG capsule Take 100 mg by mouth 3 (three) times daily. Patient taking differently takes 2 capsules (200mg ) twice daily   Yes [provider]    FLUoxetine (PROZAC) 10 MG capsule Take 3 capsules (30 mg total) by mouth daily. 06/04/16   Debbrah Alar, NP  FLUoxetine (PROZAC) 10 MG capsule Take 3 capsules (30 mg total) by mouth daily. For depression 12/11/16   Lindell Spar I, NP  hydrOXYzine (ATARAX/VISTARIL) 25 MG tablet Take 1 tablet (25 mg total) by mouth 3 (three) times daily as needed for anxiety. 12/11/16   Lindell Spar I, NP  LORazepam (ATIVAN) 0.5 MG tablet Take 1 tablet (0.5 mg total) by mouth daily as needed for anxiety. 06/03/16   Brunetta Jeans, PA-C  norethindrone-ethinyl estradiol-iron (MICROGESTIN FE 1.5/30) 1.5-30 MG-MCG tablet Take 1 tablet by mouth daily. 06/10/16   Ann Held, DO  SUMAtriptan (IMITREX) 50 MG tablet Take 1 tablet (50 mg total) by mouth every 2 (two) hours as needed for migraine. May repeat in 2 hours if headache persists or recurs. (max 2 tabs/24hrs) 04/07/16   Debbrah Alar, NP  traZODone (DESYREL) 50 MG tablet Take 1 tablet (50 mg total) by mouth at bedtime as needed for sleep. 12/11/16   Encarnacion Slates, NP    Family History Family History  Problem Relation Age of Onset  . Hyperlipidemia Mother   . Mental illness Mother   . Hypertension Father   . Heart disease Maternal Grandmother   . Hyperlipidemia Maternal Grandmother   . Hypertension Maternal Grandmother   . Heart disease Maternal Grandfather   . Hyperlipidemia Maternal Grandfather   . Hypertension Maternal Grandfather  Social History Social History   Tobacco Use  . Smoking status: Never Smoker  . Smokeless tobacco: Never Used  Substance Use Topics  . Alcohol use: Yes    Comment: occasionally- once a month if then pt reports  . Drug use: No     Allergies   Ibuprofen   Review of Systems Review of Systems  Constitutional: Negative for chills and fever.  Respiratory: Negative for cough and shortness of breath.   Cardiovascular: Negative for chest pain.  Gastrointestinal: Positive for nausea. Negative for  abdominal pain, diarrhea and vomiting.  Genitourinary: Negative for dysuria, flank pain and hematuria.  Neurological: Positive for syncope and headaches. Negative for dizziness, weakness and numbness.  All other systems reviewed and are negative.    Physical Exam Updated Vital Signs BP 131/83   Pulse 75   Temp (!) 97.3 F (36.3 C) (Oral)   Resp 19   SpO2 100%   Physical Exam  Constitutional: She is oriented to person, place, and time. She appears well-developed and well-nourished. No distress.  HENT:  Head: Normocephalic and atraumatic.  Mouth/Throat: Oropharynx is clear and moist.  Eyes: Conjunctivae and EOM are normal. Pupils are equal, round, and reactive to light.  Neck: Neck supple.  Cardiovascular: Normal rate, regular rhythm, normal heart sounds and intact distal pulses.  Pulmonary/Chest: Effort normal and breath sounds normal. No respiratory distress.  Abdominal: Soft. There is no tenderness. There is no guarding.  Musculoskeletal: She exhibits no edema.  Midline cervical spine tenderness without swelling, deformity, step-off, or instability.  Patient has no pain in this region without palpation. Tenderness to left lateral hip without deformity, swelling, ecchymosis, or instability.  Full passive and active range of motion in the left hip through the cardinal directions.  No pain, tenderness, swelling, or other abnormality to the left knee or ankle.  Normal motor function intact in all extremities and spine. No other midline spinal tenderness.    Lymphadenopathy:    She has no cervical adenopathy.  Neurological: She is alert and oriented to person, place, and time.  No sensory deficits.  No noted speech deficits. No aphasia. Patient handles oral secretions without difficulty. No noted swallowing defects.  Equal grip strength bilaterally. Strength 5/5 in the upper extremities. Strength 5/5 with flexion and extension of the hips, knees, and ankles bilaterally.    Negative Romberg. No gait disturbance. Coordination intact including heel to shin and finger to nose.  Cranial nerves III-XII grossly intact.  No facial droop.   Skin: Skin is warm and dry. Capillary refill takes less than 2 seconds. She is not diaphoretic.  Psychiatric: She has a normal mood and affect. Her behavior is normal.  Nursing note and vitals reviewed.    ED Treatments / Results  Labs (all labs ordered are listed, but only abnormal results are displayed) Labs Reviewed  BASIC METABOLIC PANEL - Abnormal; Notable for the following components:      Result Value   CO2 21 (*)    Glucose, Bld 134 (*)    All other components within normal limits  URINALYSIS, ROUTINE W REFLEX MICROSCOPIC - Abnormal; Notable for the following components:   APPearance TURBID (*)    Ketones, ur 5 (*)    Protein, ur 30 (*)    Bacteria, UA RARE (*)    All other components within normal limits  CBC WITH DIFFERENTIAL/PLATELET  RAPID URINE DRUG SCREEN, HOSP PERFORMED  TROPONIN I  TROPONIN I  I-STAT BETA HCG BLOOD, ED ([facility], [facility],  [facility] ONLY)  CBG MONITORING, ED    EKG  EKG Interpretation  Date/Time:  Monday November 02 2017 06:21:15 EST Ventricular Rate:  87 PR Interval:    QRS Duration: 101 QT Interval:  407 QTC Calculation: 490 R Axis:   86 Text Interpretation:  Sinus rhythm RSR' in V1 or V2, right VCD or RVH Borderline prolonged QT interval No previous ECGs available Confirmed by Ezequiel Essex [phone]) on 11/02/2017 6:35:32 AM       Radiology Dg Thoracic Spine 2 View  Result Date: 11/02/2017 CLINICAL DATA:  Pain following fall EXAM: THORACIC SPINE 3 VIEWS COMPARISON:  None. FINDINGS: Frontal, lateral, and swimmer's views were obtained. No fracture or spondylolisthesis. The disc spaces appear normal. No erosive change or paraspinous lesion. Visualized lungs are clear. IMPRESSION: No fracture or spondylolisthesis.  No evident arthropathy. Electronically Signed   By: Lowella Grip III M.D.    On: 11/02/2017 08:03   Ct Head Wo Contrast  Result Date: 11/02/2017 CLINICAL DATA:  Syncopal episode, left temporal headache, fall EXAM: CT HEAD WITHOUT CONTRAST CT CERVICAL SPINE WITHOUT CONTRAST TECHNIQUE: Multidetector CT imaging of the head and cervical spine was performed following the standard protocol without intravenous contrast. Multiplanar CT image reconstructions of the cervical spine were also generated. COMPARISON:  None. FINDINGS: CT HEAD FINDINGS Brain: No evidence of acute infarction, hemorrhage, hydrocephalus, extra-axial collection or mass lesion/mass effect. Vascular: No hyperdense vessel or unexpected calcification. Skull: Normal. Negative for fracture or focal lesion. Sinuses/Orbits: No acute finding. Other: None. CT CERVICAL SPINE FINDINGS Alignment: Straightened cervical spine alignment may be positional or spasm related. Skull base and vertebrae: No acute fracture. No primary bone lesion or focal pathologic process. Soft tissues and spinal canal: No prevertebral fluid or swelling. No visible canal hematoma. Disc levels: No significant degenerative disc disease or spondylosis. Disc spaces and vertebral body heights preserved. Upper chest: Negative. Other: None. IMPRESSION: Normal head CT without contrast.  No acute intracranial abnormality. Straightened cervical spine alignment may be positional or spasm related. No acute cervical spine fracture or malalignment. Electronically Signed   By: Jerilynn Mages.  Shick M.D.   On: 11/02/2017 08:53   Ct Cervical Spine Wo Contrast  Result Date: 11/02/2017 CLINICAL DATA:  Syncopal episode, left temporal headache, fall EXAM: CT HEAD WITHOUT CONTRAST CT CERVICAL SPINE WITHOUT CONTRAST TECHNIQUE: Multidetector CT imaging of the head and cervical spine was performed following the standard protocol without intravenous contrast. Multiplanar CT image reconstructions of the cervical spine were also generated. COMPARISON:  None. FINDINGS: CT HEAD FINDINGS Brain: No  evidence of acute infarction, hemorrhage, hydrocephalus, extra-axial collection or mass lesion/mass effect. Vascular: No hyperdense vessel or unexpected calcification. Skull: Normal. Negative for fracture or focal lesion. Sinuses/Orbits: No acute finding. Other: None. CT CERVICAL SPINE FINDINGS Alignment: Straightened cervical spine alignment may be positional or spasm related. Skull base and vertebrae: No acute fracture. No primary bone lesion or focal pathologic process. Soft tissues and spinal canal: No prevertebral fluid or swelling. No visible canal hematoma. Disc levels: No significant degenerative disc disease or spondylosis. Disc spaces and vertebral body heights preserved. Upper chest: Negative. Other: None. IMPRESSION: Normal head CT without contrast.  No acute intracranial abnormality. Straightened cervical spine alignment may be positional or spasm related. No acute cervical spine fracture or malalignment. Electronically Signed   By: Jerilynn Mages.  Shick M.D.   On: 11/02/2017 08:53   Dg Hip Unilat W Or Wo Pelvis 2-3 Views Left  Result Date: 11/02/2017 CLINICAL DATA:  Pain following fall EXAM:  DG HIP (WITH OR WITHOUT PELVIS) 2-3V LEFT COMPARISON:  None. FINDINGS: Frontal pelvis as well as frontal and lateral views of the left hip were obtained. No fracture or dislocation. Joint spaces appear normal. No erosive change. IMPRESSION: No fracture or dislocation.  No evident arthropathy. Electronically Signed   By: Lowella Grip III M.D.   On: 11/02/2017 08:04    Procedures Procedures (including critical care time)  Medications Ordered in ED Medications  sodium chloride 0.9 % bolus 1,000 mL (0 mLs Intravenous Stopped 11/02/17 0723)  sodium chloride 0.9 % bolus 1,000 mL (0 mLs Intravenous Stopped 11/02/17 0912)  methylPREDNISolone sodium succinate (SOLU-MEDROL) 125 mg/2 mL injection 125 mg (125 mg Intravenous Given 11/02/17 0811)  metoCLOPramide (REGLAN) injection 10 mg (10 mg Intravenous Given 11/02/17  0811)  ketorolac (TORADOL) 30 MG/ML injection 30 mg (30 mg Intravenous Given 11/02/17 0811)     Initial Impression / Assessment and Plan / ED Course  I have reviewed the triage vital signs and the nursing notes.  Pertinent labs & imaging results that were available during my care of the patient were reviewed by me and considered in my medical decision making (see chart for details).  Clinical Course as of Nov 03 1055  Mon Nov 02, 2017  0800 Discussed lab results with the patient.  Patient's headache has improved.  Denies additional complaints.  [SJ]  1829 Discussed imaging results with the patient. Headache is now 2/10. Continues to deny additional complaints.   [SJ]  1000 Patient states she feels "back to normal" and is ready for discharge.  [SJ]    Clinical Course User Index [SJ] Joy, Shawn C, PA-C    Patient presents for evaluation following syncope.  She has no high risk syncope factors. No neurologic deficits.  Headache resolved with conservative management.  PCP follow-up. The patient was given instructions for home care as well as return precautions. Patient voices understanding of these instructions, accepts the plan, and is comfortable with discharge.  EKG findings: I think arrhythmia is unlikely. EKG shows sinus rhythm with no interval abnormalities such as QT prolongation or WPW. There are no findings to suggest Brugada syndrome. Cardiac monitoring in the emergency department reveals not tachycardic or bradycardic dysrhythmia. Hypertrophic cardiomyopathy was considered, but there are no clear historical elements pointing toward this. EKG is not suggestive. The QRS voltages are not extremely large and there are no suggestive Q waves.    Vitals:   11/02/17 0700 11/02/17 0715 11/02/17 0815 11/02/17 1000  BP: 118/82 (!) 123/91  117/73  Pulse: 81 80 90 94  Resp: (!) 22 (!) 22 20 20   Temp:      TempSrc:      SpO2: 100% 100% 97% 98%   Vitals:   11/02/17 0815 11/02/17 1000  11/02/17 1100 11/02/17 1143  BP:  117/73 115/83   Pulse: 90 94 94   Resp: 20 20 19    Temp:    98.3 F (36.8 C)  TempSrc:    Oral  SpO2: 97% 98% 98%      Final Clinical Impressions(s) / ED Diagnoses   Final diagnoses:  Syncope and collapse    ED Discharge Orders    None       Layla Maw 11/02/17 1608    Blanchie Dessert, MD 11/03/17 1042

## 2017-11-02 NOTE — ED Notes (Signed)
Pt resting no needs

## 2017-11-02 NOTE — ED Notes (Signed)
Patient walked without any issues.  Accepted some water and saltine crackers.

## 2017-11-03 MED FILL — GABAPENTIN 100 MG CAP: 100 | 30 days supply | Qty: 120 | Fill #0

## 2017-11-05 MED FILL — FLUoxetine HCL 10 MG CAPS: 10 | 30 days supply | Qty: 90 | Fill #0

## 2017-11-05 MED FILL — BUPROPION HCL XL 300 MG TAB: 300 | 30 days supply | Qty: 30 | Fill #0

## 2017-11-12 ENCOUNTER — Encounter: Payer: Self-pay | Admitting: Family

## 2017-11-12 ENCOUNTER — Ambulatory Visit (INDEPENDENT_AMBULATORY_CARE_PROVIDER_SITE_OTHER): Payer: No Typology Code available for payment source | Admitting: Family

## 2017-11-12 VITALS — BP 116/76 | HR 77 | Temp 98.6°F | Resp 16 | Ht 64.0 in | Wt 120.0 lb

## 2017-11-12 DIAGNOSIS — F418 Other specified anxiety disorders: Secondary | ICD-10-CM

## 2017-11-12 DIAGNOSIS — R55 Syncope and collapse: Secondary | ICD-10-CM | POA: Diagnosis not present

## 2017-11-12 NOTE — Patient Instructions (Signed)
Please continue to follow with psychiatry at [facility]. Keep your upcoming appointment with Dr. Krista Blue (neurology). Avoid driving pending further recommendations from Neurology.

## 2017-11-12 NOTE — Progress Notes (Signed)
Subjective:    Patient ID: Julie Webb, female    DOB: Dec 23, 1990, 27 y.o.   MRN: [id number]  HPI  Patient is a 27 year old female who presents today with chief complaint of episode of loss of consciousness.  This occurred on November 02, 2017. ED noted reviewed. Patient was at work when this occurred. 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 Reports that she had a sudden "migraine level" headache. Had associated nausea.  Stepped outside to get some air. Returned to her unit and was seated and sat forward. HA and nausea worsened and the next thing that she new she was being woken up because she was on the floor. One of her colleagues saw her out of the corner of her eye and it just looked like she slumped over. She reports that she had hip pain upon waking. Blood sugar was I the 80's.  Rapid response was contacted. She passed out again when they tried to get her into the wheelchair. She then started to hyperventilate and have a "panic attack." She was then wheeled to the ER.  She has returned to work.  Denied bowel or bladder control and no obvious seizure activity. She reports that about 7-8 years ago when she was "painting." Witness though it looked like a seizure.  She did not seek care at that tim due to lack of insurance. CT head was normal. cspine and hip xray negative.   Reports tremors "whole body" following the incident. She has an appointment with neurology already but needs formal referral.    Reports that she is following at [facility] for depression/anxiety. Reports that she has been doing pretty well. Reports that she had a suicide attempt 1 year ago and went to behavioral health. No issues since that time.  Using gabapentin prn anxiety.   Review of Systems See HPI  Past Medical History:  Diagnosis Date  . Anxiety   . Depression   . Glomerulonephritis, poststreptococcal    age 54  . Migraines      Social History   Socioeconomic History  . Marital status: Married    Spouse  name: Not on file  . Number of children: Not on file  . Years of education: Not on file  . Highest education level: Not on file  Social Needs  . Financial resource strain: Not on file  . Food insecurity - worry: Not on file  . Food insecurity - inability: Not on file  . Transportation needs - medical: Not on file  . Transportation needs - non-medical: Not on file  Occupational History  . Not on file  Tobacco Use  . Smoking status: Never Smoker  . Smokeless tobacco: Never Used  Substance and Sexual Activity  . Alcohol use: Yes    Comment: occasionally- once a month if then pt reports  . Drug use: No  . Sexual activity: Yes    Birth control/protection: Condom, None  Other Topics Concern  . 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  Family History  Problem Relation Age of Onset  . Hyperlipidemia Mother   . Mental illness Mother   . Hypertension Father   . Heart disease Maternal Grandmother   . Hyperlipidemia Maternal Grandmother   .  Hypertension Maternal Grandmother   . Heart disease Maternal Grandfather   . Hyperlipidemia Maternal Grandfather   . Hypertension Maternal Grandfather     Allergies  Allergen Reactions  . Ibuprofen Other (See Comments)    Kidney failure    Current Outpatient Medications on File Prior to Visit  Medication Sig Dispense Refill  . buPROPion (WELLBUTRIN XL) 300 MG 24 hr tablet Take 300 mg by mouth daily.    Marland Kitchen FLUoxetine (PROZAC) 10 MG capsule Take 3 capsules (30 mg total) by mouth daily. 90 capsule 0  . FLUoxetine (PROZAC) 10 MG capsule Take 3 capsules (30 mg total) by mouth daily. For depression 90 capsule 0  . gabapentin (NEURONTIN) 100 MG capsule Take 100 mg by mouth 3 (three) times daily. Patient taking differently takes 2 capsules (200mg ) twice  daily    . LORazepam (ATIVAN) 0.5 MG tablet Take 1 tablet (0.5 mg total) by mouth daily as needed for anxiety. 30 tablet 0   No current facility-administered medications on file prior to visit.     BP 116/76 (BP Location: Right Arm, Patient Position: Sitting, Cuff Size: Small)   Pulse 77   Temp 98.6 F (37 C) (Oral)   Resp 16   Ht 5\' 4"  (1.626 m)   Wt 120 lb (54.4 kg)   LMP 10/20/2017   SpO2 100%   BMI 20.60 kg/m       Objective:   Physical Exam  Constitutional: She appears well-developed and well-nourished.  Cardiovascular: Normal rate, regular rhythm and normal heart sounds.  No murmur heard. Pulmonary/Chest: Effort normal and breath sounds normal. No respiratory distress. She has no wheezes.  Psychiatric: She has a normal mood and affect. Her behavior is normal. Judgment and thought content normal.          Assessment & Plan:  Syncope- concerning for seizure.  I have advised pt to keep her appointment with neurology on 1/29 and to avoid driving pending further recommendations.  Patient is advised to schedule a physical at her convenience.   Depression/anxiety- stable, management per psychiatry.

## 2017-11-17 ENCOUNTER — Ambulatory Visit: Payer: Self-pay | Admitting: Neurology

## 2017-11-17 ENCOUNTER — Telehealth: Payer: Self-pay | Admitting: *Deleted

## 2017-11-17 NOTE — Telephone Encounter (Signed)
Patient called at 1:55pm to cancel 2pm appt.  Per phone room: stated she had been sick for a couple of days and did not realize what day it was.

## 2017-11-18 ENCOUNTER — Encounter: Payer: Self-pay | Admitting: Neurology

## 2017-12-04 ENCOUNTER — Ambulatory Visit (INDEPENDENT_AMBULATORY_CARE_PROVIDER_SITE_OTHER): Payer: No Typology Code available for payment source | Admitting: Family

## 2017-12-04 ENCOUNTER — Encounter: Payer: Self-pay | Admitting: Family

## 2017-12-04 ENCOUNTER — Other Ambulatory Visit (HOSPITAL_COMMUNITY)
Admission: RE | Admit: 2017-12-04 | Discharge: 2017-12-04 | Disposition: A | Discharge status: Home / Self Care | Payer: No Typology Code available for payment source | Source: Ambulatory Visit | Attending: Family Medicine | Admitting: Family Medicine

## 2017-12-04 VITALS — BP 123/81 | HR 86 | Temp 98.4°F | Resp 16 | Ht 64.0 in | Wt 118.8 lb

## 2017-12-04 DIAGNOSIS — Z01419 Encounter for gynecological examination (general) (routine) without abnormal findings: Secondary | ICD-10-CM | POA: Insufficient documentation

## 2017-12-04 DIAGNOSIS — Z0000 Encounter for general adult medical examination without abnormal findings: Secondary | ICD-10-CM

## 2017-12-04 DIAGNOSIS — Z23 Encounter for immunization: Secondary | ICD-10-CM | POA: Diagnosis not present

## 2017-12-04 LAB — LIPID PANEL
CHOLESTEROL: 140 mg/dL (ref 0–200)
HDL: 58 mg/dL (ref >39.00)
LDL Cholesterol: 75 mg/dL (ref 0–99)
NonHDL: 82.18
Total CHOL/HDL Ratio: 2
Triglycerides: 36 mg/dL (ref 0.0–149.0)
VLDL: 7.2 mg/dL (ref 0.0–40.0)

## 2017-12-04 LAB — URINALYSIS, ROUTINE W REFLEX MICROSCOPIC
Bilirubin Urine: NEGATIVE
Hgb urine dipstick: NEGATIVE
Leukocytes, UA: NEGATIVE
NITRITE: NEGATIVE
RBC / HPF: NONE SEEN (ref 0–?)
Specific Gravity, Urine: >=1.03 — AB (ref 1.000–1.030)
Total Protein, Urine: NEGATIVE
UROBILINOGEN UA: 0.2 (ref 0.0–1.0)
Urine Glucose: NEGATIVE
pH: 5.5 (ref 5.0–8.0)

## 2017-12-04 LAB — COMPREHENSIVE METABOLIC PANEL
ALT: 8 U/L (ref 0–35)
AST: 10 U/L (ref 0–37)
Albumin: 4.1 g/dL (ref 3.5–5.2)
Alkaline Phosphatase: 38 U/L — ABNORMAL LOW (ref 39–117)
BUN: 13 mg/dL (ref 6–23)
CALCIUM: 9.1 mg/dL (ref 8.4–10.5)
CHLORIDE: 106 meq/L (ref 96–112)
CO2: 26 meq/L (ref 19–32)
Creatinine, Ser: 0.81 mg/dL (ref 0.40–1.20)
GFR: 90.56 mL/min (ref >60.00)
Glucose, Bld: 95 mg/dL (ref 70–99)
Potassium: 4 mEq/L (ref 3.5–5.1)
Sodium: 138 mEq/L (ref 135–145)
Total Bilirubin: 0.4 mg/dL (ref 0.2–1.2)
Total Protein: 6.9 g/dL (ref 6.0–8.3)

## 2017-12-04 LAB — TSH: TSH: 1.4 u[IU]/mL (ref 0.35–4.50)

## 2017-12-04 NOTE — Patient Instructions (Signed)
Please complete lab work prior to leaving. Work on adding regular exercise. Schedule routine dental care.

## 2017-12-04 NOTE — Progress Notes (Signed)
Subjective:    Patient ID: Julie Webb, female    DOB: 1991/06/03, 27 y.o.   MRN: [id number]  HPI  Patient presents today for complete physical.  Immunizations: Td due today.   Diet:  Reports diet is healthy Exercise:  Not exercising formally Pap Smear: 10/04/13, due reports abnormal pap followed by a second which was wnl. Vision: due Dental: due- has never seen a dentist  Wt Readings from Last 3 Encounters:  12/04/17 118 lb 12.8 oz (53.9 kg)  11/12/17 120 lb (54.4 kg)  06/03/16 120 lb (54.4 kg)     Review of Systems  Constitutional: Negative for unexpected weight change.  HENT: Negative for rhinorrhea.   Eyes: Negative for visual disturbance.  Respiratory: Negative for cough.   Cardiovascular: Negative for leg swelling.  Gastrointestinal: Negative for diarrhea.       Occasional chronic nausea- her whole life, rarely vomits now.    Genitourinary: Negative for dysuria, frequency and menstrual problem.  Musculoskeletal: Negative for arthralgias and myalgias.  Skin: Negative for rash.  Neurological: Positive for headaches.  Hematological: Negative for adenopathy.  Psychiatric/Behavioral:       Denies current depression/anxiety symptoms       Past Medical History:  Diagnosis Date  . Anxiety   . Depression   . Glomerulonephritis, poststreptococcal    age 1  . Migraines      Social History   Socioeconomic History  . Marital status: Married    Spouse name: Not on file  . Number of children: Not on file  . Years of education: Not on file  . Highest education level: Not on file  Social Needs  . Financial resource strain: Not on file  . Food insecurity - worry: Not on file  . Food insecurity - inability: Not on file  . Transportation needs - medical: Not on file  . Transportation needs - non-medical: Not on file  Occupational History  . Not on file  Tobacco Use  . Smoking status: Never Smoker  . Smokeless tobacco: Never Used  Substance and Sexual Activity   . Alcohol use: Yes    Comment: occasionally- once a month if then pt reports  . Drug use: No  . Sexual activity: Yes    Birth control/protection: Condom, None  Other Topics Concern  . 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 No pertinent surgical history.  Family History  Problem Relation Age of Onset  . Hyperlipidemia Mother   . Mental illness Mother   . Fibroids Mother        uterine  . Hypertension Father   . Heart attack Father   . Heart disease Maternal Grandmother   . Hyperlipidemia Maternal Grandmother   . Hypertension Maternal Grandmother   . Heart disease Maternal Grandfather   . Hyperlipidemia Maternal Grandfather   . Hypertension Maternal Grandfather   . Pancreatic cancer Paternal Grandmother   . Mesothelioma Paternal Grandfather     Allergies  Allergen Reactions  . Ibuprofen Other (See Comments)    Kidney failure    Current Outpatient Medications on File Prior to Visit  Medication Sig Dispense Refill  . buPROPion (WELLBUTRIN XL) 300 MG 24 hr tablet Take 300 mg by mouth daily.    Marland Kitchen FLUoxetine (  PROZAC) 10 MG capsule Take 3 capsules (30 mg total) by mouth daily. For depression 90 capsule 0  . gabapentin (NEURONTIN) 100 MG capsule Take 100 mg by mouth 3 (three) times daily. Patient taking differently takes 2 capsules (200mg ) twice daily    . LORazepam (ATIVAN) 0.5 MG tablet Take 1 tablet (0.5 mg total) by mouth daily as needed for anxiety. 30 tablet 0   No current facility-administered medications on file prior to visit.     BP 123/81 (BP Location: Left Arm, Cuff Size: Normal)   Pulse 86   Temp 98.4 F (36.9 C) (Oral)   Resp 16   Ht 5\' 4"  (1.626 m)   Wt 118 lb 12.8 oz (53.9 kg)   LMP 11/20/2017   SpO2 100%   BMI 20.39 kg/m    Objective:   Physical Exam  Physical Exam    Constitutional: She is oriented to person, place, and time. She appears well-developed and well-nourished. No distress.  HENT:  Head: Normocephalic and atraumatic.  Right Ear: Tympanic membrane and ear canal normal.  Left Ear: Tympanic membrane and ear canal normal.  Mouth/Throat: Oropharynx is clear and moist.  Eyes: Pupils are equal, round, and reactive to light. No scleral icterus.  Neck: Normal range of motion. No thyromegaly present.  Cardiovascular: Normal rate and regular rhythm.   No murmur heard. Pulmonary/Chest: Effort normal and breath sounds normal. No respiratory distress. He has no wheezes. She has no rales. She exhibits no tenderness.  Abdominal: Soft. Bowel sounds are normal. She exhibits no distension and no mass. There is no tenderness. There is no rebound and no guarding.  Musculoskeletal: She exhibits no edema.  Lymphadenopathy:    She has no cervical adenopathy.  Neurological: She is alert and oriented to person, place, and time. She has normal patellar reflexes. She exhibits normal muscle tone. Coordination normal.  Skin: Skin is warm and dry.  Psychiatric: She has a normal mood and affect. Her behavior is normal. Judgment and thought content normal.  Breasts: Examined lying Right: Without masses, retractions, discharge or axillary adenopathy.  Left: Without masses, retractions, discharge or axillary adenopathy.  Inguinal/mons: Normal without inguinal adenopathy  External genitalia: Normal  BUS/Urethra/Skene's glands: Normal  Bladder: Normal  Vagina: Normal  Cervix: Normal  Uterus: normal in size, shape and contour. Midline and mobile  Adnexa/parametria:  Rt: Without masses or tenderness.  Lt: Without masses or tenderness.  Anus and perineum: Normal            Assessment & Plan:   Preventative care- discussed continuing healthy diet and adding regular exercise. Obtain routine lab work. Pap performed today. Encouraged pt to establish with a dentist for  routine dental care. We did discuss referral to GI for her nausea and she declines. Td today.       Assessment & Plan:

## 2017-12-07 LAB — CYTOLOGY - PAP: Diagnosis: NEGATIVE

## 2017-12-08 ENCOUNTER — Encounter: Payer: Self-pay | Admitting: Family

## 2017-12-09 MED FILL — BUPROPION HCL XL 300 MG TAB: 300 | 30 days supply | Qty: 30 | Fill #1

## 2017-12-09 MED FILL — GABAPENTIN 100 MG CAP: 100 | 30 days supply | Qty: 120 | Fill #1

## 2017-12-09 MED FILL — FLUoxetine HCL 10 MG CAPS: 10 | 30 days supply | Qty: 90 | Fill #1

## 2017-12-25 ENCOUNTER — Ambulatory Visit: Payer: No Typology Code available for payment source | Admitting: Neurology

## 2017-12-25 ENCOUNTER — Encounter: Payer: Self-pay | Admitting: Neurology

## 2017-12-25 VITALS — BP 112/77 | HR 87 | Ht 64.0 in | Wt 119.0 lb

## 2017-12-25 DIAGNOSIS — G43709 Chronic migraine without aura, not intractable, without status migrainosus: Secondary | ICD-10-CM

## 2017-12-25 DIAGNOSIS — IMO0002 Reserved for concepts with insufficient information to code with codable children: Secondary | ICD-10-CM

## 2017-12-25 MED ORDER — VERAPAMIL HCL ER 120 MG PO TBCR: 120.0000 mg | EXTENDED_RELEASE_TABLET | Freq: Every day | ORAL | 11 refills | Status: DC

## 2017-12-25 MED ORDER — BUTALBITAL-APAP-CAFFEINE 50-325-40 MG PO TABS
1.0000 | ORAL_TABLET | Freq: Four times a day (QID) | ORAL | 5 refills | Status: DC | PRN
Start: 2017-12-25 — End: 2021-08-16

## 2017-12-25 NOTE — Progress Notes (Signed)
[name] DOB: December 25, 1990  Chief Complaint  Patient presents with  . New Patient (Initial Visit)     HISTORICAL  Julie Webb is a 27 year old female, seen in refer by her primary care nurse practitioner Debbrah Alar, for evaluation of passing out, initial evaluation was on December 25, 2017.  Reviewed and summarized the referring note, she has history of migraine, depression, anxiety, long history of migraine headaches,  Her typical migraine is severe pounding headache with associated light noise sensitivity, nauseous, blurry vision, she has migraine about once a week, but severe headache about couple times each months, partial response to over-the-counter Tylenol, and Excedrin migraine headaches.  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  She reported 8 out of 10 headaches, which eventually improved after headache cocktail at emergency room.  I personally reviewed CT scan in January 2019, no significant abnormality on CT head and cervical spine.  Laboratory evaluation in 2019, normal TSH, lipid profile, CMP,  Negative UDS, troponin, CBC hemoglobin of 13.7.  This is her second similar event, first event was in 2017, also preceded by sudden onset severe headache, transient loss of consciousness   REVIEW OF SYSTEMS: Full 14 system review of systems performed and notable only for fatigue, weight loss,  ALLERGIES: Allergies  Allergen Reactions  . Ibuprofen Other (See Comments)    Kidney failure    HOME MEDICATIONS: Current Outpatient Medications  Medication Sig Dispense Refill  . buPROPion (WELLBUTRIN  XL) 300 MG 24 hr tablet Take 300 mg by mouth daily.    Marland Kitchen FLUoxetine (PROZAC) 10 MG capsule Take 3 capsules (30 mg total) by mouth daily. For depression 90 capsule 0  . gabapentin (NEURONTIN) 100 MG capsule Take 100 mg by mouth 3 (three) times daily. Patient taking differently takes 2 capsules (200mg ) twice daily    . LORazepam (ATIVAN) 0.5 MG tablet Take 1 tablet (0.5 mg total) by mouth daily as needed for anxiety. 30 tablet 0   No current facility-administered medications for this visit.     PAST MEDICAL HISTORY: Past Medical History:  Diagnosis Date  . Anxiety   . Depression   . Glomerulonephritis, poststreptococcal    age 24  . Migraines     PAST SURGICAL HISTORY: History reviewed. No pertinent surgical history.  FAMILY HISTORY: Family History  Problem Relation Age of Onset  . Hyperlipidemia Mother   . Mental illness Mother   . Fibroids Mother        uterine  . Hypertension Father   . Heart attack Father   . Heart disease Maternal Grandmother   . Hyperlipidemia Maternal Grandmother   . Hypertension Maternal Grandmother   . Heart disease Maternal Grandfather   . Hyperlipidemia Maternal Grandfather   . Hypertension Maternal Grandfather   . Pancreatic cancer Paternal Grandmother   . Mesothelioma Paternal Grandfather     SOCIAL HISTORY:  Social History   Socioeconomic History  . Marital status: Married    Spouse name: Not on file  . Number of children: Not on file  . Years of education: Not on file  . Highest education level:  Not on file  Social Needs  . Financial resource strain: Not on file  . Food insecurity - worry: Not on file  . Food insecurity - inability: Not on file  . Transportation needs - medical: Not on file  . Transportation needs - non-medical: Not on file  Occupational History  . Not on file  Tobacco Use  . Smoking status: Never Smoker  . Smokeless tobacco: Never Used  Substance and Sexual Activity  . Alcohol use: Yes    Comment:  occasionally- once a month if then pt reports  . Drug use: No  . Sexual activity: Yes    Birth control/protection: Condom, None  Other Topics Concern  . 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  PHYSICAL EXAMNIATION:  Gen: NAD, conversant, well nourised, obese, well groomed                     Cardiovascular: Regular rate rhythm, no peripheral edema, warm, nontender. Eyes: Conjunctivae clear without exudates or hemorrhage Neck: Supple, no carotid bruits. Pulmonary: Clear to auscultation bilaterally   NEUROLOGICAL EXAM:  MENTAL STATUS: Speech:    Speech is normal; fluent and spontaneous with normal comprehension.  Cognition:     Orientation to time, place and person     Normal recent and remote memory     Normal Attention span and concentration     Normal Language, naming, repeating,spontaneous speech     Fund of knowledge   CRANIAL NERVES: CN II: Visual fields are full to confrontation. Fundoscopic exam is normal with sharp discs and no vascular changes. Pupils are round equal and briskly reactive to light. CN III, IV, VI: extraocular movement are normal. No ptosis. CN V: Facial sensation is intact to pinprick in all 3 divisions bilaterally. Corneal responses are intact.  CN VII: Face is symmetric with normal eye closure and smile. CN VIII: Hearing is normal to rubbing fingers CN IX, X: Palate elevates symmetrically. Phonation is normal. CN XI: Head turning and shoulder shrug are intact CN XII: Tongue is midline with normal movements and no atrophy.  MOTOR: There is no pronator drift of out-stretched arms. Muscle bulk and tone are normal.  Muscle strength is normal.  REFLEXES: Reflexes are 2+ and symmetric at the biceps, triceps, knees, and ankles. Plantar responses are flexor.  SENSORY: Intact to light touch, pinprick, positional sensation and vibratory sensation are intact in fingers and toes.  COORDINATION: Rapid alternating movements and fine finger movements are intact. There is no dysmetria on finger-to-nose and heel-knee-shin.    GAIT/STANCE: Posture is normal. Gait is steady with normal steps, base, arm swing, and turning. Heel and toe walking are normal. Tandem gait is normal.  Romberg is absent.   DIAGNOSTIC DATA (LABS, IMAGING, TESTING) - I reviewed patient records, labs, notes, testing and imaging myself where available.   ASSESSMENT AND PLAN  Julie Webb is a 27 y.o. female   History most consistent with basilar migraine,  Normal neurological examination, CT head without contrast  After discussed with patient, will hold off further imaging evaluation at this point,  Verapamil CR 120 mg every night as preventive medications  Fioricet as needed for migraine   Marcial Pacas, M.D. Ph.D.  [facility] [address] [place], [place] [address] Ph: [phone] Fax: [phone]  CC: Debbrah Alar, NP

## 2018-02-01 MED FILL — FLUoxetine HCL 10 MG CAPS: 10 | 30 days supply | Qty: 90 | Fill #2

## 2018-02-01 MED FILL — BUPROPION HCL XL 300 MG TAB: 300 | 30 days supply | Qty: 30 | Fill #2

## 2018-02-01 MED FILL — VERAPAMIL ER 120 MG TABLET: 120 | 30 days supply | Qty: 30 | Fill #0

## 2018-02-01 MED FILL — GABAPENTIN 100 MG CAP: 100 | 30 days supply | Qty: 120 | Fill #2

## 2018-02-01 MED FILL — BUTALB-ACETAMIN-CAFF 50-325: 50-325-40 | 3 days supply | Qty: 12 | Fill #0

## 2018-03-10 MED FILL — BUPROPION HCL XL 300 MG TAB: 300 | 30 days supply | Qty: 30 | Fill #0

## 2018-03-10 MED FILL — FLUoxetine HCL 10 MG CAPS: 10 | 30 days supply | Qty: 90 | Fill #0

## 2018-03-10 MED FILL — VERAPAMIL ER 120 MG TABLET: 120 | 30 days supply | Qty: 30 | Fill #1

## 2018-04-05 ENCOUNTER — Ambulatory Visit: Payer: No Typology Code available for payment source | Admitting: Neurology

## 2018-04-26 MED FILL — GABAPENTIN 100 MG CAP: 100 | 30 days supply | Qty: 120 | Fill #0

## 2018-04-26 MED FILL — BUPROPION HCL XL 300 MG TAB: 300 | 30 days supply | Qty: 30 | Fill #0

## 2018-04-26 MED FILL — DULoxetine HCL 30 MG CPEP: 30 | 30 days supply | Qty: 60 | Fill #0

## 2018-04-26 MED FILL — busPIRone HCL 10 MG TABS: 10 | 30 days supply | Qty: 60 | Fill #0

## 2018-04-26 MED FILL — VERAPAMIL ER 120 MG TABLET: 120 | 30 days supply | Qty: 30 | Fill #2

## 2018-07-06 ENCOUNTER — Ambulatory Visit: Payer: No Typology Code available for payment source | Admitting: Neurology

## 2018-08-26 ENCOUNTER — Ambulatory Visit (INDEPENDENT_AMBULATORY_CARE_PROVIDER_SITE_OTHER): Payer: No Typology Code available for payment source | Admitting: Neurology

## 2018-08-26 ENCOUNTER — Encounter

## 2018-08-26 ENCOUNTER — Encounter: Payer: Self-pay | Admitting: Neurology

## 2018-08-26 VITALS — BP 117/73 | HR 76 | Ht 64.0 in | Wt 127.8 lb

## 2018-08-26 DIAGNOSIS — G43709 Chronic migraine without aura, not intractable, without status migrainosus: Secondary | ICD-10-CM

## 2018-08-26 DIAGNOSIS — IMO0002 Reserved for concepts with insufficient information to code with codable children: Secondary | ICD-10-CM

## 2018-08-26 MED ORDER — VERAPAMIL HCL ER 120 MG PO TBCR: 120.0000 mg | EXTENDED_RELEASE_TABLET | Freq: Every day | ORAL | 4 refills | Status: DC

## 2018-08-26 NOTE — Patient Instructions (Signed)
Magnesium oxide 400 mg twice a day Riboflavin= vitamin B2 100 mg twice a day

## 2018-08-26 NOTE — Progress Notes (Signed)
[name] DOB: 06-02-1991  Chief Complaint  Patient presents with  . Migraine    Reports only having two migraines since starting verapamil in March 2019.  Fiorcet works well for her pain.     HISTORICAL  Julie Webb is a 27 year old female, seen in refer by her primary care nurse practitioner Debbrah Alar, for evaluation of passing out, initial evaluation was on December 25, 2017.  Reviewed and summarized the referring note, she has history of migraine, depression, anxiety, long history of migraine headaches,  Her typical migraine is severe pounding headache with associated light noise sensitivity, nauseous, blurry vision, she has migraine about once a week, but severe headache about couple times each months, partial response to over-the-counter Tylenol, and Excedrin migraine headaches.  On November 02, 2017, she works as a Buyer, retail at [employer], shift started at 7 PM, around 4 AM, she had sudden onset headaches, bilateral temporal area severe pressure pain, quickly build up within 30 minutes, when she was sitting at her desk, she had transient loss of consciousness, was taken by coworker to the wheelchair, then to the emergency room, she had vague recollection of the event.  She reported 8 out of 10 headaches, which eventually improved after headache cocktail at emergency room.  I personally reviewed CT scan in January 2019, no significant abnormality on CT head and cervical spine.  Laboratory evaluation in 2019, normal TSH, lipid profile, CMP,  Negative UDS, troponin, CBC hemoglobin of 13.7.  This is her second similar event, first event was in 2017, also preceded by sudden onset severe headache, transient loss of consciousness  UPDATE Aug 26 2018: Her headache has much improved with verapamil CR 120 mg every night, Fioricet works well for her,  REVIEW OF SYSTEMS: Full 14 system review of systems performed and notable only for as above, all rest review  of the system was negative.  ALLERGIES: Allergies  Allergen Reactions  . Ibuprofen Other (See Comments)    Kidney failure    HOME MEDICATIONS: Current Outpatient Medications  Medication Sig Dispense Refill  . buPROPion (WELLBUTRIN XL) 300 MG 24 hr tablet Take 300 mg by mouth daily.    . busPIRone HCl (BUSPAR PO) Take 1 tablet by mouth daily.    . butalbital-acetaminophen-caffeine (FIORICET, ESGIC) 50-325-40 MG tablet Take 1 tablet by mouth every 6 (six) hours as needed for headache. 12 tablet 5  . DULoxetine (CYMBALTA) 30 MG capsule Take 30 mg by mouth 2 (two) times daily.    Marland Kitchen gabapentin (NEURONTIN) 100 MG capsule Take 100 mg by mouth 3 (three) times daily. Patient taking differently takes 2 capsules (200mg ) twice daily    . verapamil (CALAN-SR) 120 MG CR tablet Take 1 tablet (120 mg total) by mouth at bedtime. 30 tablet 11   No current facility-administered medications for this visit.     PAST MEDICAL HISTORY: Past Medical History:  Diagnosis Date  . Anxiety   . Depression   . Glomerulonephritis, poststreptococcal    age 57  . Migraines     PAST SURGICAL HISTORY: History reviewed. No pertinent surgical history.  FAMILY HISTORY: Family History  Problem Relation Age of Onset  . Hyperlipidemia Mother   . Mental illness Mother   . Fibroids Mother        uterine  . Hypertension Father   . Heart attack Father   . Heart disease Maternal Grandmother   . Hyperlipidemia Maternal Grandmother   . Hypertension Maternal Grandmother   .  Heart disease Maternal Grandfather   . Hyperlipidemia Maternal Grandfather   . Hypertension Maternal Grandfather   . Pancreatic cancer Paternal Grandmother   . Mesothelioma Paternal Grandfather     SOCIAL HISTORY:  Social History   Socioeconomic History  . Marital status: Married    Spouse name: Not on file  . Number of children: Not on file  . Years of education: Not on file  . Highest education level: Not on file  Occupational  History  . Not on file  Social Needs  . Financial resource strain: Not on file  . Food insecurity:    Worry: Not on file    Inability: Not on file  . Transportation needs:    Medical: Not on file    Non-medical: Not on file  Tobacco Use  . Smoking status: Never Smoker  . Smokeless tobacco: Never Used  Substance and Sexual Activity  . Alcohol use: Yes    Comment: occasionally- once a month if then pt reports  . Drug use: No  . Sexual activity: Yes    Birth control/protection: Condom, None  Lifestyle  . Physical activity:    Days per week: Not on file    Minutes per session: Not on file  . Stress: Not on file  Relationships  . Social connections:    Talks on phone: Not on file    Gets together: Not on file    Attends religious service: Not on file    Active member of club or organization: Not on file    Attends meetings of clubs or organizations: Not on file    Relationship status: Not on file  . Intimate partner violence:    Fear of current or ex partner: Not on file    Emotionally abused: Not on file    Physically abused: Not on file    Forced sexual activity: Not on file  Other Topics Concern  . 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  PHYSICAL EXAMNIATION:  Gen: NAD, conversant, well nourised, obese, well groomed                     Cardiovascular: Regular rate rhythm, no peripheral edema, warm, nontender. Eyes: Conjunctivae clear without exudates or hemorrhage Neck: Supple, no carotid bruits. Pulmonary: Clear to auscultation bilaterally   NEUROLOGICAL EXAM:  MENTAL STATUS: Speech:    Speech is normal; fluent and  spontaneous with normal comprehension.  Cognition:     Orientation to time, place and person     Normal recent and remote memory     Normal Attention span and concentration     Normal Language, naming, repeating,spontaneous speech     Fund of knowledge   CRANIAL NERVES: CN II: Visual fields are full to confrontation. Fundoscopic exam is normal with sharp discs and no vascular changes. Pupils are round equal and briskly reactive to light. CN III, IV, VI: extraocular movement are normal. No ptosis. CN V: Facial sensation is intact to pinprick in  all 3 divisions bilaterally. Corneal responses are intact.  CN VII: Face is symmetric with normal eye closure and smile. CN VIII: Hearing is normal to rubbing fingers CN IX, X: Palate elevates symmetrically. Phonation is normal. CN XI: Head turning and shoulder shrug are intact CN XII: Tongue is midline with normal movements and no atrophy.  MOTOR: There is no pronator drift of out-stretched arms. Muscle bulk and tone are normal. Muscle strength is normal.  REFLEXES: Reflexes are 2+ and symmetric at the biceps, triceps, knees, and ankles. Plantar responses are flexor.  SENSORY: Intact to light touch, pinprick, positional sensation and vibratory sensation are intact in fingers and toes.  COORDINATION: Rapid alternating movements and fine finger movements are intact. There is no dysmetria on finger-to-nose and heel-knee-shin.    GAIT/STANCE: Posture is normal. Gait is steady with normal steps, base, arm swing, and turning. Heel and toe walking are normal. Tandem gait is normal.  Romberg is absent.   DIAGNOSTIC DATA (LABS, IMAGING, TESTING) - I reviewed patient records, labs, notes, testing and imaging myself where available.   ASSESSMENT AND PLAN  Julie Webb is a 27 y.o. female   History most consistent with basilar migraine,  Normal neurological examination, CT head without contrast  Continue verapamil CR 120 mg every night as  preventive medications  Fioricet as needed for migraine  Headache continued to do well, may consider taper off preventive medications,   Marcial Pacas, M.D. Ph.D.  [facility] [address], [place], [place] [address] Ph: [phone] Fax: [phone]  CC: Debbrah Alar, NP

## 2018-11-01 ENCOUNTER — Ambulatory Visit: Payer: No Typology Code available for payment source | Admitting: Psychiatry

## 2018-11-01 ENCOUNTER — Encounter: Payer: Self-pay | Admitting: Psychiatry

## 2018-11-01 VITALS — BP 102/74 | HR 76 | Ht 64.0 in | Wt 129.0 lb

## 2018-11-01 DIAGNOSIS — F3341 Major depressive disorder, recurrent, in partial remission: Secondary | ICD-10-CM | POA: Diagnosis not present

## 2018-11-01 DIAGNOSIS — F410 Panic disorder [episodic paroxysmal anxiety] without agoraphobia: Secondary | ICD-10-CM | POA: Diagnosis not present

## 2018-11-01 DIAGNOSIS — F4010 Social phobia, unspecified: Secondary | ICD-10-CM | POA: Diagnosis not present

## 2018-11-01 DIAGNOSIS — F411 Generalized anxiety disorder: Secondary | ICD-10-CM | POA: Diagnosis not present

## 2018-11-01 MED ORDER — DULOXETINE HCL 30 MG PO CPEP: 30.0000 mg | ORAL_CAPSULE | Freq: Every day | ORAL | 0 refills | Status: DC

## 2018-11-01 NOTE — Progress Notes (Signed)
[facility]  Patient ID: Julie Webb,  MRN: [id number]  PCP: Debbrah Alar, NP  Date of Evaluation: 11/01/2018 Time spent:20 minutes  Chief Complaint:  Chief Complaint    Anxiety; Depression; Panic Attack      HISTORY/CURRENT STATUS: Julie Webb is seen individually face-to-face with consent not collateral with husband waiting in the lobby as she has been reinforcing her social anxiety about attending appointment for several months for psychiatric interview and exam in now 30-month evaluation and management of social, generalized, and panic anxiety and recurrent major depression.  Patient curiously is apologetic about being off of her medication since end of July taking it for 1 month after last appointment and then not filling any further her BuSpar 10 mg twice daily, Wellbutrin 300 mg XL every morning and Cymbalta 30 mg twice daily.  She apparently was not taking gabapentin at that time.  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  She does not necessarily offer but reports when I inquire that she is 5 days late for menses and is trying to get pregnant.  However she doubts she is pregnant currently expecting menses at any time but discussing in detail options and responsibilities she has not considered in the past.  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  Because Zoloft caused sexual side effects in college of which she was intolerant, she declines any SSRI currently including to restart 30 mg of fluoxetine Prozac today as she was taking prior to Cymbalta.  She is more comfortable restarting the Cymbalta which helped anxiety more and agrees 1 step at a time to abstain from BuSpar, Wellbutrin and gabapentin for now  Depression       The patient presents with depression.  This is a  recurrent problem.  The current episode started more than 1 month ago.   The onset quality is sudden.   The problem occurs intermittently.  The problem has been gradually worsening since onset.  Associated symptoms include decreased concentration, fatigue, decreased interest and body aches.  Associated symptoms include no hopelessness and not irritable.     The symptoms are aggravated by work stress, medication, social issues and family issues.  Past treatments include nothing.  Compliance with treatment is variable.  Past compliance problems include medical issues, difficulty with treatment plan and medication issues.  Previous treatment provided moderate relief.  Risk factors include family history, a change in medication usage/dosage, the patient not taking medications correctly, emotional abuse, prior traumatic experience, prior psychiatric admission, major life event, family history of mental illness, history of mental illness, history of self-injury and history of suicide attempt.   Past medical history includes thyroid problem, recent illness, anxiety, depression, mental health disorder, schizophrenia and suicide attempts.     Pertinent negatives include no chronic pain, no terminal illness, no bipolar disorder, no eating disorder, no obsessive-compulsive disorder, no post-traumatic stress disorder and no head trauma.   Individual Medical History/ Review of Systems: Changes? :No   Allergies: Ibuprofen  Current Medications:  Current Outpatient Medications:  .  butalbital-acetaminophen-caffeine (FIORICET, ESGIC) 50-325-40 MG tablet, Take 1 tablet by mouth every 6 (six) hours as needed for headache., Disp: 12 tablet, Rfl: 5 .  DULoxetine (CYMBALTA) 30 MG capsule, Take 1 capsule (30 mg total) by mouth daily., Disp: 90 capsule, Rfl: 0 Medication Side  Effects: none  Family Medical/ Social History: Changes? No Muscle strength 5/5 and postural reflexes 0/0 with MS equals 0. MENTAL HEALTH EXAM: Muscle  strength 5/5, postural reflexes 0/0 and AIMS equals 0. Blood pressure 102/74, pulse 76, height 5\' 4"  (1.626 m), weight 129 lb (58.5 kg).Body mass index is 22.14 kg/m.  General Appearance: Casual, Guarded and Well Groomed  Eye Contact:  Fair  Speech:  Clear and Coherent  Volume:  Normal  Mood:  Anxious, Dysphoric and Euthymic and worthless  Affect:  Non-Congruent, Inappropriate, Restricted and Anxious  Thought Process:  Goal Directed and Linear  Orientation:  Full (Time, Place, and Person)  Thought Content: Logical, Obsessions and Rumination   Suicidal Thoughts:  No  Homicidal Thoughts:  No  Memory:  Immediate;   Good Remote;   Good  Judgement:  Fair  Insight:  Good  Psychomotor Activity:  Normal  Concentration:  Concentration: Fair and Attention Span: Good  Recall:  Good  Fund of Knowledge: Good  Language: Fair  Assets:  Desire for Improvement Physical Health Talents/Skills  ADL's:  Intact  Cognition: WNL  Prognosis:  Good    DIAGNOSES:    ICD-10-CM   1. Social anxiety disorder F40.10 DULoxetine (CYMBALTA) 30 MG capsule  2. Panic disorder F41.0 DULoxetine (CYMBALTA) 30 MG capsule  3. Generalized anxiety disorder F41.1 DULoxetine (CYMBALTA) 30 MG capsule  4. Recurrent major depressive disorder, in partial remission (HCC) F33.41 DULoxetine (CYMBALTA) 30 MG capsule    Receiving Psychotherapy: No    RECOMMENDATIONS: Reworking of treatment need for anxiety, less need for depression, and risk of treatment and lack of treatment for consideration of seeking pregnancy are processed.  Cymbalta for 6 months and likely gabapentin even longer.  She declines to restart Zoloft or Prozac, requesting and clarifying that her best response of social, panic and generalized anxiety has been the Cymbalta thus far especially if currently to treat with single agent and limit risk of increased headache. She doubts pregnancy currently but will monitor closely.  She will restart Cymbalta 30 mg every  morning prescribed #90 with no refill for the start of her next menses for anxieties more than depression being Category C regarding pregnancy. Should she beomce pregnant, she will process with PCP and OB as possible the medication options relative to containment of anxiety for pregnancy to be successful as well as delivery. She returns in 2 months otherwise call in interim for any concerns.  Delight Hoh, MD

## 2018-11-09 ENCOUNTER — Ambulatory Visit (INDEPENDENT_AMBULATORY_CARE_PROVIDER_SITE_OTHER): Payer: No Typology Code available for payment source | Admitting: Family

## 2018-11-09 ENCOUNTER — Telehealth: Payer: Self-pay | Admitting: Family

## 2018-11-09 ENCOUNTER — Encounter: Payer: Self-pay | Admitting: Family

## 2018-11-09 VITALS — BP 125/72 | HR 73 | Temp 98.0°F | Resp 16 | Ht 64.0 in | Wt 126.6 lb

## 2018-11-09 DIAGNOSIS — Z3201 Encounter for pregnancy test, result positive: Secondary | ICD-10-CM | POA: Diagnosis not present

## 2018-11-09 LAB — HCG, QUANTITATIVE, PREGNANCY: QUANTITATIVE HCG: 51.17 m[IU]/mL

## 2018-11-09 NOTE — Telephone Encounter (Signed)
See mychart.  

## 2018-11-09 NOTE — Progress Notes (Signed)
Subjective:    Patient ID: Julie Webb, female    DOB: [birth year], 28 y.o.   MRN: [id number]  HPI  Patient is a 28 yr old female who presents today with chief complaint of vaginal bleeding.  She reports that she had a positive home pregnancy test on 11/03/17.  And then again on 11/05/17 AM.  She then began to bleed on 1/18 in the PM.   LMP was 12/8-12/14.  Has been having cramps x 2 weeks. Has had breast tenderness but no nausea. Denies abdominal pain other than cramping.  Reports that she had a miscarriage about 4 years ago. This is her second pregnancy.    Review of Systems  See HPI  Past Medical History:  Diagnosis Date  . Anxiety   . Depression   . Glomerulonephritis, poststreptococcal    age 44  . Migraines      Social History   Socioeconomic History  . Marital status: Married    Spouse name: Not on file  . Number of children: Not on file  . Years of education: Not on file  . Highest education level: Not on file  Occupational History  . Not on file  Social Needs  . Financial resource strain: Not on file  . Food insecurity:    Worry: Not on file    Inability: Not on file  . Transportation needs:    Medical: Not on file    Non-medical: Not on file  Tobacco Use  . Smoking status: Never Smoker  . Smokeless tobacco: Never Used  Substance and Sexual Activity  . Alcohol use: Yes    Comment: occasionally- once a month if then pt reports  . Drug use: No  . Sexual activity: Yes    Birth control/protection: Condom, None  Lifestyle  . Physical activity:    Days per week: Not on file    Minutes per session: Not on file  . Stress: Not on file  Relationships  . Social connections:    Talks on phone: Not on file    Gets together: Not on file    Attends religious service: Not on file    Active member of club or organization: Not on file    Attends meetings of clubs or organizations: Not on file    Relationship status: Not on file  . Intimate partner violence:   Fear of current or ex partner: Not on file    Emotionally abused: Not on file    Physically abused: Not on file    Forced sexual activity: Not on file  Other Topics Concern  . 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  Family History  Problem Relation Age of Onset  . Hyperlipidemia Mother   . Mental illness Mother   . Fibroids Mother        uterine  . Hypertension Father   . Heart attack Father   . Heart disease Maternal Grandmother   . Hyperlipidemia Maternal Grandmother   . Hypertension Maternal Grandmother   . Heart disease Maternal Grandfather   . Hyperlipidemia Maternal Grandfather   . Hypertension Maternal Grandfather   . Pancreatic cancer Paternal Grandmother   . Mesothelioma Paternal Grandfather     Allergies  Allergen Reactions  . Ibuprofen Other (See Comments)    Kidney failure    Current Outpatient Medications on File Prior to Visit  Medication Sig Dispense Refill  . butalbital-acetaminophen-caffeine (FIORICET, ESGIC) 50-325-40 MG tablet Take 1 tablet by mouth every 6 (six) hours as needed for headache. 12 tablet 5  . DULoxetine (CYMBALTA) 30 MG capsule Take 1 capsule (30 mg total) by mouth daily. (Patient not taking: Reported on 11/09/2018) 90 capsule 0   No current facility-administered medications on file prior to visit.     BP 125/72 (BP Location: Right Arm, Patient Position: Sitting, Cuff Size: Small)   Pulse 73   Temp 98 F (36.7 C) (Oral)   Resp 16   Ht 5\' 4"  (1.626 m)   Wt 126 lb 9.6 oz (57.4 kg)   SpO2 100%   BMI 21.73 kg/m        Objective:   Physical Exam Constitutional:      Appearance: She is well-developed.  Neck:     Musculoskeletal: Neck supple.     Thyroid: No thyromegaly.  Cardiovascular:     Rate and Rhythm: Normal rate and regular  rhythm.     Heart sounds: Normal heart sounds. No murmur.  Pulmonary:     Effort: Pulmonary effort is normal. No respiratory distress.     Breath sounds: Normal breath sounds. No wheezing.  Skin:    General: Skin is warm and dry.  Neurological:     Mental Status: She is alert and oriented to person, place, and time.  Psychiatric:        Behavior: Behavior normal.        Thought Content: Thought content normal.        Judgment: Judgment normal.           Assessment & Plan:  + pregnancy test- history is most consistent with miscarriage. The patient understands this and support was provided today.  I will obtain HCG quant and refer to OB/GYN for further evaluation.  In the meantime I advised the patient to go to the ER if she develops severe abdominal pain or severe vaginal bleeding and she verbalizes understanding.

## 2018-11-09 NOTE — Patient Instructions (Signed)
You should be contacted about your referral to OB/GYN. Please go to the ER if you develop severe pain or severe bleeding.

## 2018-11-26 MED FILL — DULoxetine HCL 30 MG CPEP: 30 | 90 days supply | Qty: 90 | Fill #0

## 2018-12-08 ENCOUNTER — Encounter: Payer: No Typology Code available for payment source | Admitting: Family

## 2018-12-14 ENCOUNTER — Encounter: Payer: No Typology Code available for payment source | Admitting: Family

## 2019-01-04 ENCOUNTER — Other Ambulatory Visit: Payer: Self-pay

## 2019-01-04 ENCOUNTER — Ambulatory Visit: Payer: No Typology Code available for payment source | Admitting: Psychiatry

## 2019-01-04 ENCOUNTER — Encounter: Payer: Self-pay | Admitting: Psychiatry

## 2019-01-04 VITALS — BP 108/68 | HR 78 | Ht 64.0 in | Wt 124.0 lb

## 2019-01-04 DIAGNOSIS — F330 Major depressive disorder, recurrent, mild: Secondary | ICD-10-CM | POA: Diagnosis not present

## 2019-01-04 DIAGNOSIS — F410 Panic disorder [episodic paroxysmal anxiety] without agoraphobia: Secondary | ICD-10-CM

## 2019-01-04 DIAGNOSIS — F4010 Social phobia, unspecified: Secondary | ICD-10-CM | POA: Diagnosis not present

## 2019-01-04 DIAGNOSIS — F411 Generalized anxiety disorder: Secondary | ICD-10-CM | POA: Diagnosis not present

## 2019-01-04 MED ORDER — DULOXETINE HCL 60 MG PO CPEP: 60.0000 mg | ORAL_CAPSULE | Freq: Every day | ORAL | 0 refills | Status: DC

## 2019-01-04 NOTE — Progress Notes (Signed)
[facility]  Patient ID: Julie Webb,  MRN: [id number]  PCP: Debbrah Alar, NP  Date of Evaluation: 01/04/2019 Time spent:20 minutes  Chief Complaint:  Chief Complaint    Anxiety; Depression; Stress      HISTORY/CURRENT STATUS: Julie Webb is seen individually face-to-face with consent not collateral for psychiatric interview and exam in 47-month evaluation and management of social, generalized, and panic anxiety comorbid with partially treated depression.  Depression did exacerbate in the course of mourning her second spontaneous miscarriage studying all the possibilities including with [facility] consultation finding no specific causative conclusion or contraindication to further pregnancy.  She concludes that she reached [redacted] weeks pregnant having quantitative hCG not long after her last appointment here documenting fetal demise. She was surprised how many people ask her if she could be pregnant including here.  The demise was already evident in that quantitative specimen doubting any association with Cymbalta.  She had uterine emptying spontaneously after another week.  She finds the Cymbalta is helping and OB concluded if SSRIs have not been helpful such as Zoloft and Prozac that the Cymbalta is acceptable, just not as standard for pregnancy treatment of depression.  Patient is somewhat depressed about not grieving more for the miscarriage like husband but is grieving a lot.  They plan to work more diligently toward pregnancy, and she was very encouraged at work, therefore overall feeling better in daily life and functioning well on the job.  She has no mania, psychosis, self-harm, substance use, or suicidality.  Depression       The patient presents with depression.  This is a recurrent problem.  The current episode started more than 1 month ago.   The onset quality is sudden.   The problem occurs intermittently.  The problem has been gradually improving since onset.  Associated  symptoms include fatigue, hopelessness, decreased interest, headaches and sad.  Associated symptoms include no decreased concentration, no helplessness, does not have insomnia, not irritable, no restlessness, no appetite change, no body aches, no indigestion and no suicidal ideas.     The symptoms are aggravated by work stress, social issues and family issues.  Past treatments include SSRIs - Selective serotonin reuptake inhibitors, psychotherapy, other medications and SNRIs - Serotonin and norepinephrine reuptake inhibitors.  Compliance with treatment is variable.  Past compliance problems include difficulty with treatment plan, medication issues and medical issues.  Previous treatment provided moderate relief.  Risk factors include a recent illness, family history, family history of mental illness, history of mental illness, history of self-injury, history of suicide attempt, major life event, prior psychiatric admission, prior traumatic experience and stress.   Past medical history includes recent illness, anxiety, depression, mental health disorder and suicide attempts.     Pertinent negatives include no life-threatening condition, no physical disability, no recent psychiatric admission, no bipolar disorder, no eating disorder, no obsessive-compulsive disorder, no post-traumatic stress disorder, no schizophrenia and no head trauma.   Individual Medical History/ Review of Systems: Changes? :Yes All facets of Cymbalta including relative to pregnancy and miscarriage are processed with patient agreeing with her conclusion that Cymbalta was unrelated at this time and may not be as frequently considered but still is reasonably safe in pregnancy if she needs Cymbalta through the course of next pregnancy attempt.  Allergies: Ibuprofen  Current Medications:  Current Outpatient Medications:  .  butalbital-acetaminophen-caffeine (FIORICET, ESGIC) 50-325-40 MG tablet, Take 1 tablet by mouth every 6 (six) hours as  needed for headache., Disp: 12 tablet,  Rfl: 5 .  DULoxetine (CYMBALTA) 60 MG capsule, Take 1 capsule (60 mg total) by mouth daily., Disp: 90 capsule, Rfl: 0 Medication Side Effects: none  Family Medical/ Social History: Changes? Yes husband is doing reasonably well has mental illness but having more grief for her miscarriage.  She experienced relative bonding with peers at work over her pregnancy loss.  MENTAL HEALTH EXAM: Muscle strengths and tone 5/5, postural reflexes and gait 0/0, and AIMS = 0. Blood pressure 108/68, pulse 78, height 5\' 4"  (1.626 m), weight 124 lb (56.2 kg).Body mass index is 21.28 kg/m.  General Appearance: Casual and Fairly Groomed  Eye Contact:  Good  Speech:  Clear and Coherent and Normal Rate  Volume:  Normal  Mood:  Anxious, Depressed, Dysphoric and Hopeless  Affect:  Appropriate, Congruent, Constricted, Depressed and Anxious  Thought Process:  Goal Directed  Orientation:  Full (Time, Place, and Person)  Thought Content: Obsessions and Rumination   Suicidal Thoughts:  No  Homicidal Thoughts:  No  Memory:  Immediate;   Good Remote;   Good  Judgement:  Good  Insight:  Fair  Psychomotor Activity:  Normal  Concentration:  Concentration: Fair and Attention Span: Good  Recall:  Good  Fund of Knowledge: Good  Language: Good  Assets:  Resilience Social Support Talents/Skills  ADL's:  Intact  Cognition: WNL  Prognosis:  Good    DIAGNOSES:    ICD-10-CM   1. Social anxiety disorder F40.10 DULoxetine (CYMBALTA) 60 MG capsule  2. Panic disorder F41.0 DULoxetine (CYMBALTA) 60 MG capsule  3. Generalized anxiety disorder F41.1 DULoxetine (CYMBALTA) 60 MG capsule  4. Mild recurrent major depression ([place]) F33.0   5. Recurrent major depressive disorder, in partial remission (HCC) F33.41 DULoxetine (CYMBALTA) 60 MG capsule    Receiving Psychotherapy: No   RECOMMENDATIONS: Patient is making progress but is likely to be more overall successful and fulfilled by  increasing the current Cymbalta 30 mg to 2 capsules daily total 60 mg escribing next fill as 60 mg capsule daily #90 with no refill to [facility] outpatient pharmacy for generalized, social, and panic anxiety and depression.  She is fully back to work last weekend after miscarriage.  She declines other therapy at this time and understands resources for such, as supportive and cognitive behavioral therapies are integrated today with medication management, to return in 3 months.   Delight Hoh, MD

## 2019-01-14 MED FILL — DULOXETINE HCL 60 MG CPEP: 60 | 90 days supply | Qty: 90 | Fill #0

## 2019-01-14 MED FILL — VERAPAMIL ER 120 MG TABLET: 120 | 90 days supply | Qty: 90 | Fill #0

## 2019-02-14 ENCOUNTER — Encounter: Payer: Self-pay | Admitting: Family

## 2019-02-15 ENCOUNTER — Telehealth (INDEPENDENT_AMBULATORY_CARE_PROVIDER_SITE_OTHER): Payer: No Typology Code available for payment source | Admitting: Family

## 2019-02-15 ENCOUNTER — Other Ambulatory Visit: Payer: Self-pay

## 2019-02-15 DIAGNOSIS — Z113 Encounter for screening for infections with a predominantly sexual mode of transmission: Secondary | ICD-10-CM | POA: Diagnosis not present

## 2019-02-15 DIAGNOSIS — R8290 Unspecified abnormal findings in urine: Secondary | ICD-10-CM

## 2019-02-15 DIAGNOSIS — N898 Other specified noninflammatory disorders of vagina: Secondary | ICD-10-CM | POA: Diagnosis not present

## 2019-02-15 DIAGNOSIS — F329 Major depressive disorder, single episode, unspecified: Secondary | ICD-10-CM

## 2019-02-15 DIAGNOSIS — F32A Depression, unspecified: Secondary | ICD-10-CM

## 2019-02-15 MED ORDER — AZITHROMYCIN 250 MG PO TABS
500.0000 mg | ORAL_TABLET | Freq: Once | ORAL | Status: AC
  Administered 2019-02-15: 1000 mg via ORAL

## 2019-02-15 MED ORDER — CEFTRIAXONE SODIUM 250 MG IJ SOLR
250.0000 mg | Freq: Once | INTRAMUSCULAR | Status: AC
  Administered 2019-02-15: 250 mg via INTRAMUSCULAR

## 2019-02-15 NOTE — Addendum Note (Signed)
Addended by: Jiles Prows on: 02/15/2019 03:18 PM   Modules accepted: Orders

## 2019-02-15 NOTE — Telephone Encounter (Signed)
Please contact pt to schedule a virtual visit today.  ?

## 2019-02-15 NOTE — Progress Notes (Signed)
Virtual Visit via Video Note  I connected with  on 02/15/19 at  1:20 PM EDT by a video enabled telemedicine application and verified that I am speaking with the correct person using two identifiers. This visit type was conducted due to national recommendations for restrictions regarding the COVID-19 Pandemic (e.g. social distancing).  This format is felt to be most appropriate for this patient at this time.   I discussed the limitations of evaluation and management by telemedicine and the availability of in person appointments. The patient expressed understanding and agreed to proceed.  Only the patient and myself were on today's video visit. The patient was at home and I was in my office at the time of today's visit.   History of Present Illness:  Reports + vaginal discharge, dysuria, urine is cloudy, sediment.  Reports that cloudy urine started 3 days ago.  Vaginal discharge has been present for months.  Discharge is pale yellow.  Different for "me."  Notes painful intercourse since December.  She reports that she had a fever about 2 weeks ago. Tmax 101.2 only on one check.  Temp 99.9 when she developed cloudy urine.   Reports a lot of stress recently. Learned that her husband had an extramarital affair back in October. She reports that a few weeks back he tried to commit suicide due to guilt in hurting her. He overdosed on lithium and required vent support and a 9 day hospitalization.   Observations/Objective:  Gen: Awake, alert, no acute distress Resp: Breathing is even and non-labored Psych: calm/pleasant demeanor Neuro: Alert and Oriented x 3, + facial symmetry, speech is clear.  Assessment and Plan:  Vaginal discharge- will bring patient in for IM ceftriaxone as well as PO azithromycin for empiric rx of GC/Chlamydia. Collect urine for cytology (GC/chlamydia, yeast/bv, trichomonas).   Cloudy urine- will obtain urine culture to rule out UTI.    STD screening- desires STD screening.  Will send RPR, HSV and HSV2 in addition.   Depression- reports that she continues to work with her provider at [employer] and is continuing counseling which she is finding helpful.   Follow Up Instructions:    I discussed the assessment and treatment plan with the patient. The patient was provided an opportunity to ask questions and all were answered. The patient agreed with the plan and demonstrated an understanding of the instructions.   The patient was advised to call back or seek an in-person evaluation if the symptoms worsen or if the condition fails to improve as anticipated.    Nance Pear, NP

## 2019-02-15 NOTE — Telephone Encounter (Signed)
Patient scheduled for today at 1:20

## 2019-02-16 LAB — HSV 2 ANTIBODY, IGG: HSV 2 Glycoprotein G Ab, IgG: 4.42 index — ABNORMAL HIGH

## 2019-02-16 LAB — RPR: RPR Ser Ql: NONREACTIVE

## 2019-02-16 LAB — HIV ANTIBODY (ROUTINE TESTING W REFLEX): HIV 1&2 Ab, 4th Generation: NONREACTIVE

## 2019-02-17 LAB — URINE CULTURE
MICRO NUMBER:: 428125
SPECIMEN QUALITY:: ADEQUATE

## 2019-02-22 ENCOUNTER — Telehealth: Payer: Self-pay | Admitting: Family

## 2019-02-22 NOTE — Telephone Encounter (Signed)
Patient called requesting results for labs done on 02/15/19. No comment found regarding abnormals. Routing to PCP to contact patient with any physician comments on recent labs. She would like to hear from office, today if possible.

## 2019-02-22 NOTE — Telephone Encounter (Signed)
Patient called very upset because she still has not heard from the doctor regarding her lab results.  Please advise and call patient.  CB# [phone]

## 2019-02-22 NOTE — Telephone Encounter (Signed)
Please advise 

## 2019-02-23 ENCOUNTER — Encounter: Payer: Self-pay | Admitting: Family

## 2019-02-23 DIAGNOSIS — B009 Herpesviral infection, unspecified: Secondary | ICD-10-CM

## 2019-02-23 HISTORY — DX: Herpesviral infection, unspecified: B00.9

## 2019-02-23 MED ORDER — FLUCONAZOLE 150 MG PO TABS: ORAL_TABLET | ORAL | 0 refills | Status: DC

## 2019-02-23 MED FILL — FLUCONAZOLE 150 MG TABS: 150 | 3 days supply | Qty: 2 | Fill #0

## 2019-02-23 NOTE — Telephone Encounter (Signed)
See result note.  

## 2019-02-23 NOTE — Telephone Encounter (Signed)
Results note, (Reviewed lab results with the patient. Unfortunately the lab did not receive the urine ancillary testing and the urine culture specimen has been discarded. She states that she continues to have a some vaginal discharge and vaginal itching. We discussed trial of diflucan and if this does not improve her symptoms she will let me know and we will repeat her urinary ancillary testing.)

## 2019-02-23 NOTE — Addendum Note (Signed)
Addended by: Debbrah Alar on: 02/23/2019 08:25 AM   Modules accepted: Orders

## 2019-03-08 ENCOUNTER — Ambulatory Visit: Payer: No Typology Code available for payment source | Admitting: Mental Health

## 2019-03-08 ENCOUNTER — Other Ambulatory Visit: Payer: Self-pay

## 2019-03-08 ENCOUNTER — Ambulatory Visit (INDEPENDENT_AMBULATORY_CARE_PROVIDER_SITE_OTHER): Payer: No Typology Code available for payment source | Admitting: Mental Health

## 2019-03-08 DIAGNOSIS — F411 Generalized anxiety disorder: Secondary | ICD-10-CM | POA: Diagnosis not present

## 2019-03-08 DIAGNOSIS — F4010 Social phobia, unspecified: Secondary | ICD-10-CM

## 2019-03-08 DIAGNOSIS — F332 Major depressive disorder, recurrent severe without psychotic features: Secondary | ICD-10-CM | POA: Diagnosis not present

## 2019-03-08 NOTE — Progress Notes (Signed)
[facility] Counselor Initial Adult Exam  Name: Julie Webb Date: 03/15/2019 MRN: [id number] DOB: Oct 05, 1991 PCP: Debbrah Alar, NP  Time spent: 55 minutes  Reason for Visit /Presenting Problem:  Reports her mood has been angry, confused, depressed and lost. 1 month ago she learned her husband was cheating on her. She caught him texting a woman. He stated he did not have sex with this woman. Pt found his journal (it was oddly out in the open), where he wrote he did have sex with this woman, on multiple occassions. She became very distraught, began having SI to walk into traffic but did not follow through. This all occurred about 1.5 months ago.  She stated he coped w/ Bipolar D/O. Stated he overdosed on all his medication (lithium) in a suicide attempt, she called EMS. She stated he was on a vent for 4 days, then when stable, moved to the psych unit at [facility], d/c'd about a month ago. She was overwhelmed with fear and uncertainty at the time.  Since then, she stated they have opened up communication more, talking through their issues and attending couples counseling currently. They have had marital stress for a while, thought he was going to leave her. She had a plan to kill herself if he told her he would leave her during that time. She poured herself into work to stay busy. Avoided talking to him about any stressful issues, afraid he would leave her.  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 She is a Marine scientist in medical ICU. She stated they had an argument, she grabbed his gun at that time threatening to kill herself; she then was inpatient at [facility] for about 3 days.  She has cut back her hours to just 2 days/week 2 weeks ago to spend more time w/ her husband.     Currently, she denies plan or intent to harm herself. Passive SI at times, takes medications as  rx'd by Dr. Creig Hines, feels she is more "clear-headed". Questions still, "why did he cheat on me".  Mental Status Exam:   Appearance:   UTA- unable to assess  Behavior:  UTA  Motor:  UTA  Speech/Language:   Clear and Coherent  Affect:  UTA  Mood:  depressed  Thought process:  normal  Thought content:    WNL  Sensory/Perceptual disturbances:    WNL  Orientation:  x4  Attention:  Good  Concentration:  Good  Memory:  WNL  Fund of knowledge:   Good  Insight:    fair  Judgment:   Fair  Impulse Control:  Fair   Reported Symptoms:  Angry/irritable, confused, depressed, anxious, sleep problems, appetite disturbance, ruminations, feelings of panic  Risk Assessment: Danger to Self:  No Self-injurious Behavior: No Danger to Others: No Duty to Warn:no Physical Aggression / Violence:No  Access to Firearms a concern: No  Gang Involvement:No  Patient / guardian was educated about steps to take if suicide or homicide risk level increases between visits: yes While future psychiatric events cannot be accurately predicted, the patient does not currently require acute inpatient psychiatric care and does not currently meet [facility] involuntary commitment criteria.  Substance Abuse History: Current substance abuse: No    Medical History/Surgical History: Past Medical History:  Diagnosis Date  . Anxiety   . Depression   . Glomerulonephritis, poststreptococcal  age 35  . 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 Migraines     No past surgical history on file.  Medications: Current Outpatient Medications  Medication Sig Dispense Refill  . butalbital-acetaminophen-caffeine (FIORICET, ESGIC) 50-325-40 MG tablet Take 1 tablet by mouth every 6 (six) hours as needed for headache. 12 tablet 5  . DULoxetine (CYMBALTA) 60 MG capsule Take 1 capsule (60 mg total) by mouth daily. 90 capsule 0  . fluconazole (DIFLUCAN) 150 MG tablet Take 1 tab by mouth today. May repeat in 3 days if not  improved. 2 tablet 0   No current facility-administered medications for this visit.     Allergies  Allergen Reactions  . Ibuprofen Other (See Comments)    Kidney failure    Diagnoses:    ICD-10-CM   1. Social anxiety disorder F40.10   2. Severe episode of recurrent major depressive disorder, without psychotic features ([place]) F33.2   3. Generalized anxiety disorder F41.1     Plan of Care: complete part II of assessment next session.   Anson Oregon, [facility]

## 2019-03-18 ENCOUNTER — Other Ambulatory Visit: Payer: Self-pay

## 2019-03-18 ENCOUNTER — Ambulatory Visit (INDEPENDENT_AMBULATORY_CARE_PROVIDER_SITE_OTHER): Payer: No Typology Code available for payment source | Admitting: Mental Health

## 2019-03-18 DIAGNOSIS — F411 Generalized anxiety disorder: Secondary | ICD-10-CM

## 2019-03-18 DIAGNOSIS — F332 Major depressive disorder, recurrent severe without psychotic features: Secondary | ICD-10-CM | POA: Diagnosis not present

## 2019-03-18 NOTE — Progress Notes (Signed)
[facility] Counselor / Therapist Psychotherapy Note  Name: Julie Webb Date: 03/18/2019 MRN: [id number] DOB: 1991/03/16 PCP: Debbrah Alar, NP  Time spent: 55 minutes  Treatment:  Individual therapy  Virtual Visit via Telephone Note Connected with patient by a video enabled telemedicine/telehealth application or telephone, with their informed consent, and verified patient privacy and that I am speaking with the correct person using two identifiers. I discussed the limitations, risks, security and privacy concerns of performing psychotherapy and management service by telephone and the availability of in person appointments. I also discussed with the patient that there may be a patient responsible charge related to this service. The patient expressed understanding and agreed to proceed. I discussed the treatment planning with the patient. The patient was provided an opportunity to ask questions and all were answered. The patient agreed with the plan and demonstrated an understanding of the instructions. The patient was advised to call  our office if  symptoms worsen or feel they are in a crisis state and need immediate contact.   Therapist Location: home Patient Location: home  Mental Status Exam:   Appearance:   UTA- unable to assess  Behavior:  UTA  Motor:  UTA  Speech/Language:   Clear and Coherent  Affect:  UTA  Mood:  depressed  Thought process:  normal  Thought content:    WNL  Sensory/Perceptual disturbances:    WNL  Orientation:  x4  Attention:  Good  Concentration:  Good  Memory:  WNL  Fund of knowledge:   Good  Insight:    fair  Judgment:   Fair  Impulse Control:  Fair   Reported Symptoms:  Angry/irritable, confused, depressed, anxious, sleep problems, appetite disturbance, ruminations, feelings of panic  Risk Assessment: Danger to Self:  No Self-injurious Behavior: No Danger to Others: No Duty to Warn:no Physical Aggression / Violence:No  Access to  Firearms a concern: No  Gang Involvement:No  Patient / guardian was educated about steps to take if suicide or homicide risk level increases between visits: yes While future psychiatric events cannot be accurately predicted, the patient does not currently require acute inpatient psychiatric care and does not currently meet [facility] involuntary commitment criteria.  Substance Abuse History: Current substance abuse: No    Medical History/Surgical History: Past Medical History:  Diagnosis Date  . Anxiety   . Depression   . Glomerulonephritis, poststreptococcal    age 28  . 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 Migraines     No past surgical history on file.  Medications: Current Outpatient Medications  Medication Sig Dispense Refill  . butalbital-acetaminophen-caffeine (FIORICET, ESGIC) 50-325-40 MG tablet Take 1 tablet by mouth every 6 (six) hours as needed for headache. 12 tablet 5  . DULoxetine (CYMBALTA) 60 MG capsule Take 1 capsule (60 mg total) by mouth daily. 90 capsule 0  . fluconazole (DIFLUCAN) 150 MG tablet Take 1 tab by mouth today. May repeat in 3 days if not improved. 2 tablet 0   No current facility-administered medications for this visit.     Allergies  Allergen Reactions  . Ibuprofen Other (See Comments)    Kidney failure    Abuse History: Victim of No., none   Report needed: No. Victim of Neglect:No. Perpetrator of none  Witness / Exposure to Domestic Violence: No   Protective Services Involvement: No  Witness to [employer] Violence:  No   Living situation: Raised by both parents (alcoholics per Pt). 2 older brothers. Half sister (learned of her  at age 32, she was her father's child)  Sexual Orientation:  Straight  Relationship Status: married  Name of spouse / other: Sam               If a parent, number of children / ages:  none  Support Systems; husband, family  Financial Stress:  No   Income/Employment/Disability:    Community education officer Service: No   Educational History: Education: Scientist, product/process development:   none  Any cultural differences that may affect / interfere with treatment:  none  Recreation/Hobbies:  Gaming, tv  Stressors:Marital or family conflict  Strengths:  Working, support system  Barriers: none  Legal History: Pending legal issue / charges: none History of legal issue / charges: none  Diagnoses:    ICD-10-CM   1. Severe episode of recurrent major depressive disorder, without psychotic features ([place]) F33.2   2. Generalized anxiety disorder F41.1    Subjective:  Shared progress. [place]/ [place] Some stress with his family, feels they judge them at times, gave examples. Stated she and her husband continue to work on their relationship. She continues to cope with understanding why he cheated on her. She stated when she was not taking her meds in the past (about 2 years ago), she told him that she was and this is what he has told her is something he continues to cope with. Told her that her changes in mood was a factor in his being unfaithful. She had SI for that 2 year period, waiting for him to tell her that he was going to leave her.  She had a miscarriage 5 years ago, it was traumatic. Had another miscarriage this past January. Had started taking her medication again, was more hopeful for their relationship future until she learned he was cheating on her. They continue to attend couples counseling.  After her last session, they talked about her work schedule changing; she did not want to make that change since she just made a schedule change. She sleeps during the day, her medications help w/ her getting sleep. Husband wants her on 1st shift and awake more during the day.  She felt she was a burden in the relationship, that she brought this into the relationship. Stated she often has to rely on him- ex. Driving her due to her  not having a license.  "Driving terrifies me". Her anxiety stops her from leaving the house. "I'm bad at cooking, I dont do the dishes right". Tends to dwell on things, anxiety increases.   Encouraged her to self evaluate her belief around "I'm not good enough".    Interventions: Assessment, CBT, supportive therapy  Individualized Plan of Care:  1. Patient to engage psychiatric evaluation and follow medication regimen.  2. Patient to engage in individual psychotherapy.  3. Patient to identify and apply coping skills learned in session to decrease symptoms  4. Patient to learn and apply CBT, coping skills and strategies learned in session.  5. Patient to contact this office, go to the local ED or call 911 if a crisis or emergency develops between visits.   Anson Oregon, [facility]

## 2019-03-25 ENCOUNTER — Ambulatory Visit: Payer: No Typology Code available for payment source | Admitting: Mental Health

## 2019-03-28 ENCOUNTER — Ambulatory Visit (INDEPENDENT_AMBULATORY_CARE_PROVIDER_SITE_OTHER): Payer: No Typology Code available for payment source | Admitting: Mental Health

## 2019-03-28 DIAGNOSIS — F332 Major depressive disorder, recurrent severe without psychotic features: Secondary | ICD-10-CM

## 2019-03-28 DIAGNOSIS — F411 Generalized anxiety disorder: Secondary | ICD-10-CM

## 2019-03-28 NOTE — Progress Notes (Signed)
[facility] Counselor / Therapist Psychotherapy Note  Name: Julie Webb Date: 03/28/2019 MRN: [id number] DOB: 01/07/1991 PCP: Debbrah Alar, NP  Time spent: 55 minutes  Treatment:  Individual therapy  Virtual Visit via Telephone Note Connected with patient by a video enabled telemedicine/telehealth application or telephone, with their informed consent, and verified patient privacy and that I am speaking with the correct person using two identifiers. I discussed the limitations, risks, security and privacy concerns of performing psychotherapy and management service by telephone and the availability of in person appointments. I also discussed with the patient that there may be a patient responsible charge related to this service. The patient expressed understanding and agreed to proceed. I discussed the treatment planning with the patient. The patient was provided an opportunity to ask questions and all were answered. The patient agreed with the plan and demonstrated an understanding of the instructions. The patient was advised to call  our office if  symptoms worsen or feel they are in a crisis state and need immediate contact.   Therapist Location: home Patient Location: home  Mental Status Exam:   Appearance:   UTA- unable to assess  Behavior:  UTA  Motor:  UTA  Speech/Language:   Clear and Coherent  Affect:  UTA  Mood:  depressed  Thought process:  normal  Thought content:    WNL  Sensory/Perceptual disturbances:    WNL  Orientation:  x4  Attention:  Good  Concentration:  Good  Memory:  WNL  Fund of knowledge:   Good  Insight:    fair  Judgment:   Fair  Impulse Control:  Fair   Reported Symptoms:  Angry/irritable, confused, depressed, anxious, sleep problems, appetite disturbance, ruminations, feelings of panic  Risk Assessment: Danger to Self:  No Self-injurious Behavior: No Danger to Others: No Duty to Warn:no Physical Aggression / Violence:No  Access to Firearms  a concern: No  Gang Involvement:No  Patient / guardian was educated about steps to take if suicide or homicide risk level increases between visits: yes While future psychiatric events cannot be accurately predicted, the patient does not currently require acute inpatient psychiatric care and does not currently meet [facility] involuntary commitment criteria.  Subjective:  Shared progress. Stated she is pregnant [redacted] wks. Very worried due to her hx of miscarriages. Plans to get through the first trimester. Stated they are talking more; he started a new job.  She stated he has a new job in Press photographer.  Copes w/ Bipolar, possible ad/hd. They have been together for 10 years, stated he knows her well.  Last Saturday, she had a severe panic attack following seeing a tv show where a man was being unfaithful to his wife.  She shared details about how this affected her.  Reported her anxiety and feelings of anger and frustration were overwhelming, she left the home walking down the street and began to run.  She stated she eventually ran where she felt emotionally exhausted.  Reported that EMS was called by her husband then left after assessing patient in discussing the incident with them.  In retrospect, patient stated that she feels embarrassed about how she responded and the situation but also acknowledged the feelings she had were overwhelming.  She shared how she and her husband were able to further communicate over the following days, she is hopeful they will continue their marriage as they also continue couples counseling.  Interventions: Assessment, CBT, supportive therapy  Diagnoses: MDD, Severe, w/o psychotic features;  GAD  Individualized Plan of Care:  1. Patient to engage psychiatric evaluation and follow medication regimen.  2. Patient to engage in individual psychotherapy.  3. Patient to identify and apply coping skills learned in session to decrease symptoms  4. Patient to learn and apply CBT,  coping skills and strategies learned in session.  5. Patient to contact this office, go to the local ED or call 911 if a crisis or emergency develops between visits.   Anson Oregon, [facility]

## 2019-03-29 ENCOUNTER — Other Ambulatory Visit: Payer: Self-pay

## 2019-03-31 ENCOUNTER — Ambulatory Visit: Payer: No Typology Code available for payment source | Admitting: Mental Health

## 2019-03-31 MED FILL — PRENAISSANCE PLUS SOFTGEL: 28-1-250 | 30 days supply | Qty: 30 | Fill #0

## 2019-03-31 MED FILL — PROGESTERONE MICRONIZED 200: 200 | 30 days supply | Qty: 30 | Fill #0

## 2019-04-06 ENCOUNTER — Ambulatory Visit: Payer: No Typology Code available for payment source | Admitting: Psychiatry

## 2019-04-06 ENCOUNTER — Other Ambulatory Visit: Payer: Self-pay

## 2019-04-12 ENCOUNTER — Ambulatory Visit (INDEPENDENT_AMBULATORY_CARE_PROVIDER_SITE_OTHER): Payer: No Typology Code available for payment source | Admitting: Psychiatry

## 2019-04-12 ENCOUNTER — Encounter: Payer: Self-pay | Admitting: Psychiatry

## 2019-04-12 ENCOUNTER — Other Ambulatory Visit: Payer: Self-pay

## 2019-04-12 VITALS — Ht 64.0 in | Wt 124.0 lb

## 2019-04-12 DIAGNOSIS — F330 Major depressive disorder, recurrent, mild: Secondary | ICD-10-CM | POA: Diagnosis not present

## 2019-04-12 DIAGNOSIS — F410 Panic disorder [episodic paroxysmal anxiety] without agoraphobia: Secondary | ICD-10-CM

## 2019-04-12 DIAGNOSIS — F4010 Social phobia, unspecified: Secondary | ICD-10-CM

## 2019-04-12 DIAGNOSIS — F411 Generalized anxiety disorder: Secondary | ICD-10-CM | POA: Diagnosis not present

## 2019-04-12 MED ORDER — DULOXETINE HCL 60 MG PO CPEP: 60.0000 mg | ORAL_CAPSULE | Freq: Every day | ORAL | 1 refills | Status: DC

## 2019-04-12 MED FILL — DULoxetine HCL 60 MG CPEP: 60 | 90 days supply | Qty: 90 | Fill #0

## 2019-04-12 NOTE — Progress Notes (Signed)
[facility] Med Check  Patient ID: Julie Webb,  MRN: [id number]  PCP: Debbrah Alar, NP  Date of Evaluation: 04/12/2019 Time spent:20 minutes from 1320 to 1340  Chief Complaint:  Chief Complaint    Depression; Anxiety; Panic Attack      HISTORY/CURRENT STATUS: Julie Webb is seen onsite in office face-to-face arriving just before I was to phone her for a telemedicine appointment individually with her brother in the lobby with consent with therapy collateral for psychiatric interview and exam in 19-month evaluation and management of depression and social, generalized, and panic anxiety.  When seen in January after 7 months of noncompliance with medications, she started Cymbalta 30 mg daily though with some concern that she might be a few days late for her menses. She was pregnant but miscarried before appointment in March when she felt guilty that husband was grieving more than she was for the loss of their 2nd pregnancy by miscarriage.  She was directed to reschedule here early as her therapy sessions in May interpreted severe major depression, though she now arrives being only mildly depressed though 1 week late for the appointment finally scheduled by patient though she is exactly 3 months after last appointment as agreed upon.  Mother and family of origin have been very pleased with the patient's improvement on Cymbalta and have been supportive through her miscarriages.  Husband overdosed with lithium and Lamictal almost requiring dialysis still awaiting normalization of renal functions not here today.  She is again [redacted] weeks pregnant now receiving progesterone support after her second miscarriage as well as taking a baby aspirin and high-dose prenatal vitamin.  The verapamil is underway for migraine again and Cymbalta 60 mg daily for mood with obstetrician considering her to have more benefit than risk from the medication for pregnancy and not considering it related to miscarriage.  She has  humor today and can more directly discuss anxieties making progress in her therapy on Cymbalta.  She agrees to continue the therapy which is been very helpful and to be seen in my office periodically during the pregnancy though accepting that obstetrics will monitor most closely.  She is not manic, psychotic, delirious, or dissociating.   Depression      The patient presents with depression as a recurrent problem with current episode starting just more than 1 month ago.   The onset quality is sudden.   The problem occurs intermittently.  The problem is now improving since onset.  Associated symptoms include decreased concentration, fatigue, decreased interest and body aches.  Associated symptoms include no hopelessness and not irritable.     The symptoms are aggravated by work stress, medication, social issues and family issues.  Past treatments include SSRIs, other medications, and psychotherapy.  Compliance with treatment is poor to variable.  Past compliance problems include social anxiety, medical issues, difficulty with treatment plan and medication issues.  Previous treatment provided moderate relief.  Risk factors include family history, a change in medication usage/dosage, the patient not taking medications correctly, emotional abuse, prior traumatic experience, prior psychiatric admission, major life event, family history of mental illness, history of mental illness, history of self-injury and history of suicide attempt.   Past medical history includes thyroid problem, recent illness, anxiety, depression, mental health disorder, schizophrenia and suicide attempts.     Pertinent negatives include no chronic pain, no terminal illness, no bipolar disorder, no eating disorder, no obsessive-compulsive disorder, no post-traumatic stress disorder and no head trauma.  Individual Medical History/ Review of Systems:  Changes? :Yes With EDD 12/13/2019 by estimate today, weighing the same as 3 months  ago.  Allergies: Ibuprofen  Current Medications:  Current Outpatient Medications:  .  butalbital-acetaminophen-caffeine (FIORICET, ESGIC) 50-325-40 MG tablet, Take 1 tablet by mouth every 6 (six) hours as needed for headache., Disp: 12 tablet, Rfl: 5 .  DULoxetine (CYMBALTA) 60 MG capsule, Take 1 capsule (60 mg total) by mouth daily., Disp: 90 capsule, Rfl: 1 .  fluconazole (DIFLUCAN) 150 MG tablet, Take 1 tab by mouth today. May repeat in 3 days if not improved., Disp: 2 tablet, Rfl: 0   Medication Side Effects: none  Family Medical/ Social History: Changes? Yes mother and brother having anxiety and depression while maternal grandmother may have schizoaffective bipolar.  MENTAL HEALTH EXAM:  Height 5\' 4"  (1.626 m), weight 124 lb (56.2 kg).Body mass index is 21.28 kg/m.  Deferred as nonessential in coronavirus pandemic  General Appearance: Casual, Guarded and Well Groomed  Eye Contact:  Good to fair  Speech:  Clear and Coherent, Normal Rate and Talkative  Volume:  Normal  Mood:  Anxious, Dysphoric, Euthymic and Worthless  Affect:  Depressed, Inappropriate, Full Range and Anxious  Thought Process:  Goal Directed, Irrelevant and Linear  Orientation:  Full (Time, Place, and Person)  Thought Content: Obsessions and Rumination   Suicidal Thoughts:  No  Homicidal Thoughts:  No  Memory:  Immediate;   Good Remote;   Good  Judgement:  Good  Insight:  Fair  Psychomotor Activity:  Normal and Mannerisms  Concentration:  Concentration: Fair and Attention Span: Good  Recall:  Good  Fund of Knowledge: Good  Language: Good  Assets:  Desire for Improvement Intimacy Physical Health Talents/Skills Vocational/Educational  ADL's:  Intact  Cognition: WNL  Prognosis:  Good    DIAGNOSES:    ICD-10-CM   1. Mild recurrent major depression ([facility])  F33.0   2. Social anxiety disorder  F40.10 DULoxetine (CYMBALTA) 60 MG capsule  3. Generalized anxiety disorder  F41.1 DULoxetine (CYMBALTA) 60 MG  capsule  4. Panic disorder  F41.0 DULoxetine (CYMBALTA) 60 MG capsule    Receiving Psychotherapy: Yes  with Anson [place], LPC   RECOMMENDATIONS: Verapamil had previously been 120 mg ER daily for migraine prevention likely the current dose.  She continues progesterone, baby aspirin and high potency prenatal vitamin.  She is E scribed to continue Cymbalta 60 mg every morning as a 90-day supply and 1 refill sent to [facility] outpatient pharmacy.  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  She agrees to follow-up in 3 months or if needed.   Delight Hoh, MD

## 2019-04-15 ENCOUNTER — Ambulatory Visit: Payer: No Typology Code available for payment source | Admitting: Mental Health

## 2019-04-29 ENCOUNTER — Other Ambulatory Visit: Payer: Self-pay

## 2019-04-29 ENCOUNTER — Ambulatory Visit: Payer: No Typology Code available for payment source | Admitting: Mental Health

## 2019-05-06 MED FILL — ONDANSETRON HCL 4 MG TABLET: 4 | 8 days supply | Qty: 24 | Fill #0

## 2019-05-06 MED FILL — PROGESTERONE MICRONIZED 200: 200 | 30 days supply | Qty: 30 | Fill #1

## 2019-05-06 MED FILL — PRENAISSANCE PLUS SOFTGEL: 28-1-250 | 30 days supply | Qty: 30 | Fill #1

## 2019-05-12 ENCOUNTER — Inpatient Hospital Stay (HOSPITAL_COMMUNITY)
Admission: AD | Admit: 2019-05-12 | Discharge: 2019-05-12 | Disposition: A | Discharge status: Home / Self Care | Payer: No Typology Code available for payment source | Attending: Obstetrics & Gynecology | Admitting: Obstetrics & Gynecology

## 2019-05-12 ENCOUNTER — Other Ambulatory Visit: Payer: Self-pay

## 2019-05-12 ENCOUNTER — Ambulatory Visit: Payer: No Typology Code available for payment source | Admitting: Mental Health

## 2019-05-12 ENCOUNTER — Encounter (HOSPITAL_COMMUNITY): Payer: Self-pay | Admitting: *Deleted

## 2019-05-12 DIAGNOSIS — F419 Anxiety disorder, unspecified: Secondary | ICD-10-CM | POA: Diagnosis not present

## 2019-05-12 DIAGNOSIS — Z818 Family history of other mental and behavioral disorders: Secondary | ICD-10-CM | POA: Insufficient documentation

## 2019-05-12 DIAGNOSIS — R55 Syncope and collapse: Secondary | ICD-10-CM | POA: Diagnosis not present

## 2019-05-12 DIAGNOSIS — Z8249 Family history of ischemic heart disease and other diseases of the circulatory system: Secondary | ICD-10-CM | POA: Insufficient documentation

## 2019-05-12 DIAGNOSIS — O219 Vomiting of pregnancy, unspecified: Secondary | ICD-10-CM | POA: Diagnosis not present

## 2019-05-12 DIAGNOSIS — Z3A11 11 weeks gestation of pregnancy: Secondary | ICD-10-CM | POA: Insufficient documentation

## 2019-05-12 DIAGNOSIS — Z79899 Other long term (current) drug therapy: Secondary | ICD-10-CM | POA: Insufficient documentation

## 2019-05-12 DIAGNOSIS — R531 Weakness: Secondary | ICD-10-CM | POA: Insufficient documentation

## 2019-05-12 DIAGNOSIS — O9989 Other specified diseases and conditions complicating pregnancy, childbirth and the puerperium: Secondary | ICD-10-CM | POA: Insufficient documentation

## 2019-05-12 DIAGNOSIS — O368390 Maternal care for abnormalities of the fetal heart rate or rhythm, unspecified trimester, not applicable or unspecified: Secondary | ICD-10-CM

## 2019-05-12 DIAGNOSIS — O99411 Diseases of the circulatory system complicating pregnancy, first trimester: Secondary | ICD-10-CM | POA: Diagnosis not present

## 2019-05-12 DIAGNOSIS — Z886 Allergy status to analgesic agent status: Secondary | ICD-10-CM | POA: Diagnosis not present

## 2019-05-12 DIAGNOSIS — F329 Major depressive disorder, single episode, unspecified: Secondary | ICD-10-CM | POA: Insufficient documentation

## 2019-05-12 DIAGNOSIS — O99341 Other mental disorders complicating pregnancy, first trimester: Secondary | ICD-10-CM | POA: Diagnosis not present

## 2019-05-12 DIAGNOSIS — I498 Other specified cardiac arrhythmias: Secondary | ICD-10-CM | POA: Insufficient documentation

## 2019-05-12 DIAGNOSIS — F330 Major depressive disorder, recurrent, mild: Secondary | ICD-10-CM

## 2019-05-12 DIAGNOSIS — Z800 Family history of malignant neoplasm of digestive organs: Secondary | ICD-10-CM | POA: Diagnosis not present

## 2019-05-12 LAB — COMPREHENSIVE METABOLIC PANEL
ALT: 12 U/L (ref 0–44)
AST: 16 U/L (ref 15–41)
Albumin: 3.3 g/dL — ABNORMAL LOW (ref 3.5–5.0)
Alkaline Phosphatase: 31 U/L — ABNORMAL LOW (ref 38–126)
Anion gap: 8 (ref 5–15)
BUN: 8 mg/dL (ref 6–20)
CO2: 22 mmol/L (ref 22–32)
Calcium: 9.3 mg/dL (ref 8.9–10.3)
Chloride: 105 mmol/L (ref 98–111)
Creatinine, Ser: 0.64 mg/dL (ref 0.44–1.00)
GFR calc Af Amer: >60 mL/min (ref >60)
GFR calc non Af Amer: >60 mL/min (ref >60)
Glucose, Bld: 92 mg/dL (ref 70–99)
Potassium: 4 mmol/L (ref 3.5–5.1)
Sodium: 135 mmol/L (ref 135–145)
Total Bilirubin: 0.8 mg/dL (ref 0.3–1.2)
Total Protein: 6.3 g/dL — ABNORMAL LOW (ref 6.5–8.1)

## 2019-05-12 LAB — CBC WITH DIFFERENTIAL/PLATELET
Abs Immature Granulocytes: 0.04 10*3/uL (ref 0.00–0.07)
Basophils Absolute: 0.1 10*3/uL (ref 0.0–0.1)
Basophils Relative: 1 %
Eosinophils Absolute: 0 10*3/uL (ref 0.0–0.5)
Eosinophils Relative: 0 %
HCT: 41 % (ref 36.0–46.0)
Hemoglobin: 14 g/dL (ref 12.0–15.0)
Immature Granulocytes: 1 %
Lymphocytes Relative: 24 %
Lymphs Abs: 1.8 10*3/uL (ref 0.7–4.0)
MCH: 30.5 pg (ref 26.0–34.0)
MCHC: 34.1 g/dL (ref 30.0–36.0)
MCV: 89.3 fL (ref 80.0–100.0)
Monocytes Absolute: 0.5 10*3/uL (ref 0.1–1.0)
Monocytes Relative: 7 %
Neutro Abs: 5 10*3/uL (ref 1.7–7.7)
Neutrophils Relative %: 67 %
Platelets: 214 10*3/uL (ref 150–400)
RBC: 4.59 MIL/uL (ref 3.87–5.11)
RDW: 12.4 % (ref 11.5–15.5)
WBC: 7.4 10*3/uL (ref 4.0–10.5)
nRBC: 0 % (ref 0.0–0.2)

## 2019-05-12 LAB — URINALYSIS, ROUTINE W REFLEX MICROSCOPIC
Bilirubin Urine: NEGATIVE
Glucose, UA: NEGATIVE mg/dL
Hgb urine dipstick: NEGATIVE
Ketones, ur: 20 mg/dL — AB
Nitrite: NEGATIVE
Protein, ur: NEGATIVE mg/dL
Specific Gravity, Urine: 1.021 (ref 1.005–1.030)
pH: 6 (ref 5.0–8.0)

## 2019-05-12 LAB — OB RESULTS CONSOLE RUBELLA ANTIBODY, IGM: Rubella: NON-IMMUNE/NOT IMMUNE

## 2019-05-12 LAB — OB RESULTS CONSOLE RPR: RPR: NONREACTIVE

## 2019-05-12 LAB — OB RESULTS CONSOLE HEPATITIS B SURFACE ANTIGEN: Hepatitis B Surface Ag: NEGATIVE

## 2019-05-12 LAB — GLUCOSE, CAPILLARY: Glucose-Capillary: 82 mg/dL (ref 70–99)

## 2019-05-12 LAB — OB RESULTS CONSOLE ABO/RH: RH Type: POSITIVE

## 2019-05-12 LAB — OB RESULTS CONSOLE GC/CHLAMYDIA
Chlamydia: NEGATIVE
Gonorrhea: NEGATIVE

## 2019-05-12 LAB — OB RESULTS CONSOLE ANTIBODY SCREEN: Antibody Screen: NEGATIVE

## 2019-05-12 LAB — OB RESULTS CONSOLE HIV ANTIBODY (ROUTINE TESTING): HIV: NONREACTIVE

## 2019-05-12 MED ORDER — PROMETHAZINE HCL 25 MG/ML IJ SOLN
25.0000 mg | Freq: Once | INTRAVENOUS | Status: AC
  Administered 2019-05-12: 25 mg via INTRAVENOUS
  Filled 2019-05-12: qty 1

## 2019-05-12 MED ORDER — PROMETHAZINE HCL 12.5 MG PO TABS: 25.0000 mg | ORAL_TABLET | Freq: Four times a day (QID) | ORAL | 3 refills | Status: DC | PRN

## 2019-05-12 MED ORDER — LACTATED RINGERS IV BOLUS
1000.0000 mL | Freq: Once | INTRAVENOUS | Status: AC
  Administered 2019-05-12: 1000 mL via INTRAVENOUS

## 2019-05-12 NOTE — MAU Note (Signed)
Office called that she was coming, elevated temp 100.7 reported, with nausea and vomiting.

## 2019-05-12 NOTE — MAU Note (Addendum)
Pt C/O of N/V for a month.  Has not kept a meal down in two days.  Patient feels dizzy, lightheaded and weak. Pt states she has a "hard time keeping my eyes open".   PT states Zofran does not help PT states she fell 4 times today- 3 times on her butt and 1 time on her right hip

## 2019-05-12 NOTE — Discharge Instructions (Signed)
Hyperemesis Gravidarum Hyperemesis gravidarum is a severe form of nausea and vomiting that happens during pregnancy. Hyperemesis is worse than morning sickness. It may cause you to have nausea or vomiting all day for many days. It may keep you from eating and drinking enough food and liquids, which can lead to dehydration, malnutrition, and weight loss. Hyperemesis usually occurs during the first half (the first 20 weeks) of pregnancy. It often goes away once a woman is in her second half of pregnancy. However, sometimes hyperemesis continues through an entire pregnancy. What are the causes? The cause of this condition is not known. It may be related to changes in chemicals (hormones) in the body during pregnancy, such as the high level of pregnancy hormone (human chorionic gonadotropin) or the increase in the female sex hormone (estrogen). What are the signs or symptoms? Symptoms of this condition include:  Nausea that does not go away.  Vomiting that does not allow you to keep any food down.  Weight loss.  Body fluid loss (dehydration).  Having no desire to eat, or not liking food that you have previously enjoyed. How is this diagnosed? This condition may be diagnosed based on:  A physical exam.  Your medical history.  Your symptoms.  Blood tests.  Urine tests. How is this treated? This condition is managed by controlling symptoms. This may include:  Following an eating plan. This can help lessen nausea and vomiting.  Taking prescription medicines. An eating plan and medicines are often used together to help control symptoms. If medicines do not help relieve nausea and vomiting, you may need to receive fluids through an IV at the hospital. Follow these instructions at home: Eating and drinking   Avoid the following: ? Drinking fluids with meals. Try not to drink anything during the 30 minutes before and after your meals. ? Drinking more than 1 cup of fluid at a  time. ? Eating foods that trigger your symptoms. These may include spicy foods, coffee, high-fat foods, very sweet foods, and acidic foods. ? Skipping meals. Nausea can be more intense on an empty stomach. If you cannot tolerate food, do not force it. Try sucking on ice chips or other frozen items and make up for missed calories later. ? Lying down within 2 hours after eating. ? Being exposed to environmental triggers. These may include food smells, smoky rooms, closed spaces, rooms with strong smells, warm or humid places, overly loud and noisy rooms, and rooms with motion or flickering lights. Try eating meals in a well-ventilated area that is free of strong smells. ? Quick and sudden changes in your movement. ? Taking iron pills and multivitamins that contain iron. If you take prescription iron pills, do not stop taking them unless your health care provider approves. ? Preparing food. The smell of food can spoil your appetite or trigger nausea.  To help relieve your symptoms: ? Listen to your body. Everyone is different and has different preferences. Find what works best for you. ? Eat and drink slowly. ? Eat 5-6 small meals daily instead of 3 large meals. Eating small meals and snacks can help you avoid an empty stomach. ? In the morning, before getting out of bed, eat a couple of crackers to avoid moving around on an empty stomach. ? Try eating starchy foods as these are usually tolerated well. Examples include cereal, toast, bread, potatoes, pasta, rice, and pretzels. ? Include at least 1 serving of protein with your meals and snacks. Protein options include  lean meats, poultry, seafood, beans, nuts, nut butters, eggs, cheese, and yogurt. ? Try eating a protein-rich snack before bed. Examples of a protein-rick snack include cheese and crackers or a peanut butter sandwich made with 1 slice of whole-wheat bread and 1 tsp (5 g) of peanut butter. ? Eat or suck on things that have ginger in them.  It may help relieve nausea. Add  tsp ground ginger to hot tea or choose ginger tea. ? Try drinking 100% fruit juice or an electrolyte drink. An electrolyte drink contains sodium, potassium, and chloride. ? Drink fluids that are cold, clear, and carbonated or sour. Examples include lemonade, ginger ale, lemon-lime soda, ice water, and sparkling water. ? Brush your teeth or use a mouth rinse after meals. ? Talk with your health care provider about starting a supplement of vitamin B6. General instructions  Take over-the-counter and prescription medicines only as told by your health care provider.  Follow instructions from your health care provider about eating or drinking restrictions.  Continue to take your prenatal vitamins as told by your health care provider. If you are having trouble taking your prenatal vitamins, talk with your health care provider about different options.  Keep all follow-up and pre-birth (prenatal) visits as told by your health care provider. This is important. Contact a health care provider if:  You have pain in your abdomen.  You have a severe headache.  You have vision problems.  You are losing weight.  You feel weak or dizzy. Get help right away if:  You cannot drink fluids without vomiting.  You vomit blood.  You have constant nausea and vomiting.  You are very weak.  You faint.  You have a fever and your symptoms suddenly get worse. Summary  Hyperemesis gravidarum is a severe form of nausea and vomiting that happens during pregnancy.  Making some changes to your eating habits may help relieve nausea and vomiting.  This condition may be managed with medicine.  If medicines do not help relieve nausea and vomiting, you may need to receive fluids through an IV at the hospital. This information is not intended to replace advice given to you by your health care provider. Make sure you discuss any questions you have with your health care  provider. Document Released: 10/06/2005 Document Revised: 10/26/2017 Document Reviewed: 06/04/2016 Elsevier Patient Education  2020 [employer].

## 2019-05-12 NOTE — MAU Provider Note (Signed)
Chief Complaint: Fever, Emesis, and Nausea   First Provider Initiated Contact with Patient 05/12/19 1616     SUBJECTIVE HPI: Julie Webb is a 28 y.o. G3P0020 at [redacted]w[redacted]d who presents to Maternity Admissions reporting N/V x 1 month that has worsened over the past few days to 3-4 times per day.  Has not been able to keep anything down for 2 days.  Feeling very weak.  Reports falling down due to weakness approximately 4 times today.  Thinks she may have briefly lost consciousness once.  Denies any trauma, shaking or loss of bowel or bladder control.  Has been taking vitamin B and Unisom and more recently Zofran which had been controlling her nausea and vomiting but now are not.  States she is able to keep Zofran down.   It was reported that the patient had a fever of 100.7 at [facility] today, but patient is very certain that it was 40.9 at the office.  States she is not had a fever at home either.  Afebrile and MAU.  Associated signs and symptoms: Pos for weakness, possible syncope vs near-syncope. Negative for chills, abdominal pain, vaginal bleeding, urinary complaints, diarrhea, constipation, respiratory complains, SOB.   Past Medical History:  Diagnosis Date  . Anxiety   . Depression   . Glomerulonephritis, poststreptococcal    age 73  . 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 Migraines    OB History  Gravida Para Term Preterm AB Living  3       2    SAB TAB Ectopic Multiple Live Births  2            # Outcome Date GA Lbr Len/2nd Weight Sex Delivery Anes PTL Lv  3 Current           2 SAB 10/20/18 [redacted]w[redacted]d    SAB     1 SAB 10/20/14 [redacted]w[redacted]d          History reviewed. No pertinent surgical history. Social History   Socioeconomic History  . Marital status: Married    Spouse name: Not on file  . Number of children: Not on file  . Years of education: Not on file  . Highest education level: Not on file  Occupational History  . Not on file  Social Needs  . Financial resource  strain: Not on file  . Food insecurity    Worry: Not on file    Inability: Not on file  . Transportation needs    Medical: Not on file    Non-medical: Not on file  Tobacco Use  . Smoking status: Never Smoker  . Smokeless tobacco: Never Used  Substance and Sexual Activity  . Alcohol use: Yes    Comment: occasionally- once a month if then pt reports  . Drug use: No  . Sexual activity: Yes    Birth control/protection: Condom, None  Lifestyle  . Physical activity    Days per week: Not on file    Minutes per session: Not on file  . Stress: Not on file  Relationships  . Social Herbalist on phone: Not on file    Gets together: Not on file    Attends religious service: Not on file    Active member of club or organization: Not on file    Attends meetings of clubs or organizations: Not on file    Relationship status: Not on file  . Intimate partner violence    Fear of current or  ex partner: Not on file    Emotionally abused: Not on file    Physically abused: Not on file    Forced sexual activity: Not on file  Other Topics Concern  . 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 Hyperlipidemia Mother   . Mental illness Mother   . Fibroids Mother        uterine  . Hypertension Father   . Heart attack Father   . Heart disease Maternal Grandmother   . Hyperlipidemia Maternal Grandmother   . Hypertension Maternal Grandmother   . Heart disease Maternal Grandfather   . Hyperlipidemia Maternal Grandfather   . Hypertension Maternal Grandfather   . Pancreatic cancer Paternal Grandmother   . Mesothelioma Paternal Grandfather    No current facility-administered medications on file prior to encounter.    Current Outpatient Medications on File Prior to Encounter   Medication Sig Dispense Refill  . butalbital-acetaminophen-caffeine (FIORICET, ESGIC) 50-325-40 MG tablet Take 1 tablet by mouth every 6 (six) hours as needed for headache. 12 tablet 5  . DULoxetine (CYMBALTA) 60 MG capsule Take 1 capsule (60 mg total) by mouth daily. 90 capsule 1  . fluconazole (DIFLUCAN) 150 MG tablet Take 1 tab by mouth today. May repeat in 3 days if not improved. 2 tablet 0  . Prenat w/o A-FeCbn-DSS-FA-DHA (PRENAISSANCE PLUS) 28-1-250 MG CAPS Take 1 capsule by mouth daily.    . progesterone (PROMETRIUM) 200 MG capsule Take 200 mg by mouth daily.    . verapamil (CALAN-SR) 120 MG CR tablet Take 120 mg by mouth daily.     Allergies  Allergen Reactions  . Ibuprofen Other (See Comments)    Kidney failure    I have reviewed patient's Past Medical Hx, Surgical Hx, Family Hx, Social Hx, medications and allergies.   Review of Systems  Constitutional: Positive for fatigue. Negative for appetite change, chills and fever.  Respiratory: Negative for cough and shortness of breath.   Cardiovascular: Negative for chest pain and palpitations.       Brief sensation of rapid heartbeat--resolved  Gastrointestinal: Positive for nausea and vomiting. Negative for abdominal pain, constipation and diarrhea.  Genitourinary: Negative for dysuria, pelvic pain and vaginal discharge.  Musculoskeletal: Negative for gait problem, neck pain and neck stiffness.  Neurological: Positive for syncope, weakness and light-headedness. Negative for dizziness, seizures, speech difficulty, numbness and headaches.  Psychiatric/Behavioral: Negative for confusion.    OBJECTIVE Patient Vitals for the past 24 hrs:  BP Temp Temp src Pulse SpO2  05/12/19 1524 118/81 98.7 F (37.1 C) Oral 72 100 %   Constitutional: Well-developed, well-nourished female in no acute distress. Weak-appearing.  Head: Mucus membranes moist Cardiovascular: normal rate Respiratory: normal rate and effort.  GI: Abd soft,  non-tender, gravid appropriate for gestational age.  Neurologic: Alert and oriented x 4. Nml speech and gait.  GU: Deferred  LAB RESULTS Results for orders placed or performed during the hospital encounter of 05/12/19 (from the past 24 hour(s))  Urinalysis, Routine w reflex microscopic     Status: Abnormal   Collection Time: 05/12/19  3:25 PM  Result Value Ref Range   Color, Urine YELLOW YELLOW   APPearance HAZY (A) CLEAR   Specific Gravity, Urine 1.021  1.005 - 1.030   pH 6.0 5.0 - 8.0   Glucose, UA NEGATIVE NEGATIVE mg/dL   Hgb urine dipstick NEGATIVE NEGATIVE   Bilirubin Urine NEGATIVE NEGATIVE   Ketones, ur 20 (A) NEGATIVE mg/dL   Protein, ur NEGATIVE NEGATIVE mg/dL   Nitrite NEGATIVE NEGATIVE   Leukocytes,Ua LARGE (A) NEGATIVE   RBC / HPF 0-5 0 - 5 RBC/hpf   WBC, UA 6-10 0 - 5 WBC/hpf   Bacteria, UA RARE (A) NONE SEEN   Squamous Epithelial / LPF 0-5 0 - 5   Mucus PRESENT   CBC with Differential/Platelet     Status: None   Collection Time: 05/12/19  3:47 PM  Result Value Ref Range   WBC 7.4 4.0 - 10.5 K/uL   RBC 4.59 3.87 - 5.11 MIL/uL   Hemoglobin 14.0 12.0 - 15.0 g/dL   HCT 41.0 36.0 - 46.0 %   MCV 89.3 80.0 - 100.0 fL   MCH 30.5 26.0 - 34.0 pg   MCHC 34.1 30.0 - 36.0 g/dL   RDW 12.4 11.5 - 15.5 %   Platelets 214 150 - 400 K/uL   nRBC 0.0 0.0 - 0.2 %   Neutrophils Relative % 67 %   Neutro Abs 5.0 1.7 - 7.7 K/uL   Lymphocytes Relative 24 %   Lymphs Abs 1.8 0.7 - 4.0 K/uL   Monocytes Relative 7 %   Monocytes Absolute 0.5 0.1 - 1.0 K/uL   Eosinophils Relative 0 %   Eosinophils Absolute 0.0 0.0 - 0.5 K/uL   Basophils Relative 1 %   Basophils Absolute 0.1 0.0 - 0.1 K/uL   Immature Granulocytes 1 %   Abs Immature Granulocytes 0.04 0.00 - 0.07 K/uL  Comprehensive metabolic panel     Status: Abnormal   Collection Time: 05/12/19  3:47 PM  Result Value Ref Range   Sodium 135 135 - 145 mmol/L   Potassium 4.0 3.5 - 5.1 mmol/L   Chloride 105 98 - 111 mmol/L   CO2 22 22  - 32 mmol/L   Glucose, Bld 92 70 - 99 mg/dL   BUN 8 6 - 20 mg/dL   Creatinine, Ser 0.64 0.44 - 1.00 mg/dL   Calcium 9.3 8.9 - 10.3 mg/dL   Total Protein 6.3 (L) 6.5 - 8.1 g/dL   Albumin 3.3 (L) 3.5 - 5.0 g/dL   AST 16 15 - 41 U/L   ALT 12 0 - 44 U/L   Alkaline Phosphatase 31 (L) 38 - 126 U/L   Total Bilirubin 0.8 0.3 - 1.2 mg/dL   GFR calc non Af Amer >60 >60 mL/min   GFR calc Af Amer >60 >60 mL/min   Anion gap 8 5 - 15  Glucose, capillary     Status: None   Collection Time: 05/12/19  4:17 PM  Result Value Ref Range   Glucose-Capillary 82 70 - 99 mg/dL    IMAGING Pt informed that the ultrasound is considered a limited OB ultrasound and is not intended to be a complete ultrasound exam.  Patient also informed that the ultrasound is not being completed with the intent of assessing for fetal or placental anomalies or any pelvic abnormalities.  Explained that the purpose of today's ultrasound is to assess for  FHR.  Patient acknowledges the purpose of the exam and the limitations of the study.  FHR 176 by informal BS [place]. Active fetus.    MAU COURSE Orders Placed This Encounter  Procedures  . Urinalysis, Routine w reflex microscopic  .  CBC with Differential/Platelet  . Comprehensive metabolic panel  . Glucose, capillary  . EKG 12-Lead  . EKG 12-Lead  . Insert peripheral IV  . Discharge patient   Meds ordered this encounter  Medications  . lactated ringers bolus 1,000 mL  . promethazine (PHENERGAN) 25 mg in dextrose 5% lactated ringers 1,000 mL infusion  . promethazine (PHENERGAN) 12.5 MG tablet    Sig: Take 2 tablets (25 mg total) by mouth every 6 (six) hours as needed for nausea or vomiting.    Dispense:  40 tablet    Refill:  3    Order Specific Question:   Supervising Provider    Answer:   Woodroe Mode [[id number]]   Feeling much better after IV fluids and Phenergan.  No longer feeling weak.  Ambulating and keeping down fluids and crackers.  Reports feeling restless.   Possibly side effect of Phenergan.  Offered Benadryl.  Patient declines, states she will take some at home if she is still feeling restless.  Will prescribe 12.5 mg dose of Phenergan to decrease risk of side effects.  Also expect that p.o. Phenergan is less likely to cause side effects.  Discussed Hx, labs, exam, EKG w/ Dr. Debara Pickett, cardiologist.  Reviewed EKG--normal.  Agrees w/ POC. New orders: None.   MDM - Weakness and syncope likely secondary to poor p.o. intake.  Symptoms resolved with hydration and antiemetics.  Normal electrolytes and EKG.  - Nausea and vomiting of pregnancy controlled well with Phenergan.  Tolerating p.o. fluids and crackers.   ASSESSMENT 1. Nausea/vomiting in pregnancy   2. Syncope, unspecified syncope type   3. Weakness generalized     PLAN Discharge home in stable condition. First trimester and hyperemesis precautions Recommend small frequent drinks and snacks. Follow-up Information    [facility], [facility] Follow up on 05/13/2019.   Why: as scheduled or as needed if symptoms worsen Contact information: [place] [address] [phone]        [facility] Follow up.   Specialty: Obstetrics and Gynecology Why: as needed in pregnancy emergencies Contact information: [address] [id number] [place] [phone]         Allergies as of 05/12/2019      Reactions   Ibuprofen Other (See Comments)   Kidney failure      Medication List    TAKE these medications   butalbital-acetaminophen-caffeine 50-325-40 MG tablet Commonly known as: FIORICET Take 1 tablet by mouth every 6 (six) hours as needed for headache.   DULoxetine 60 MG capsule Commonly known as: CYMBALTA Take 1 capsule (60 mg total) by mouth daily.   fluconazole 150 MG tablet Commonly known as: DIFLUCAN Take 1 tab by mouth today. May repeat in 3 days if not improved.   Prenaissance Plus 28-1-250 MG Caps Take  1 capsule by mouth daily.   progesterone 200 MG capsule Commonly known as: PROMETRIUM Take 200 mg by mouth daily.   promethazine 12.5 MG tablet Commonly known as: PHENERGAN Take 2 tablets (25 mg total) by mouth every 6 (six) hours as needed for nausea or vomiting.   verapamil 120 MG CR tablet Commonly known as: CALAN-SR Take 120 mg by mouth daily.        Tamala Julian, [place], [place] 05/12/2019  6:51 PM

## 2019-05-13 MED FILL — PROMETHAZINE 12.5 MG TABLET: 12.5 | 5 days supply | Qty: 40 | Fill #0

## 2019-05-25 LAB — OB RESULTS CONSOLE GBS: GBS: POSITIVE

## 2019-05-27 ENCOUNTER — Other Ambulatory Visit: Payer: Self-pay

## 2019-05-27 ENCOUNTER — Ambulatory Visit (INDEPENDENT_AMBULATORY_CARE_PROVIDER_SITE_OTHER): Payer: No Typology Code available for payment source | Admitting: Mental Health

## 2019-05-27 DIAGNOSIS — F330 Major depressive disorder, recurrent, mild: Secondary | ICD-10-CM

## 2019-05-27 DIAGNOSIS — F411 Generalized anxiety disorder: Secondary | ICD-10-CM | POA: Diagnosis not present

## 2019-05-27 MED FILL — CEPHALEXIN 500 MG CAPSULE: 500 | 7 days supply | Qty: 21 | Fill #0

## 2019-05-27 NOTE — Progress Notes (Signed)
[facility] Counselor / Therapist Psychotherapy Note  Name: Julie Webb Date: 05/27/2019 MRN: [id number] DOB: 1991/07/29 PCP: Debbrah Alar, NP  Time spent: 55 minutes  Treatment:  Individual therapy  Virtual Visit via Telephone Note Connected with patient by a video enabled telemedicine/telehealth application or telephone, with their informed consent, and verified patient privacy and that I am speaking with the correct person using two identifiers. I discussed the limitations, risks, security and privacy concerns of performing psychotherapy and management service by telephone and the availability of in person appointments. I also discussed with the patient that there may be a patient responsible charge related to this service. The patient expressed understanding and agreed to proceed. I discussed the treatment planning with the patient. The patient was provided an opportunity to ask questions and all were answered. The patient agreed with the plan and demonstrated an understanding of the instructions. The patient was advised to call  our office if  symptoms worsen or feel they are in a crisis state and need immediate contact.   Therapist Location: home Patient Location: home  Mental Status Exam:   Appearance:   UTA- unable to assess  Behavior:  UTA  Motor:  UTA  Speech/Language:   Clear and Coherent  Affect:  UTA  Mood:  depressed  Thought process:  normal  Thought content:    WNL  Sensory/Perceptual disturbances:    WNL  Orientation:  x4  Attention:  Good  Concentration:  Good  Memory:  WNL  Fund of knowledge:   Good  Insight:    fair  Judgment:   Fair  Impulse Control:  Fair   Reported Symptoms:  Angry/irritable, confused, depressed, anxious, sleep problems, appetite disturbance, ruminations, feelings of panic  Risk Assessment: Danger to Self:  No Self-injurious Behavior: No Danger to Others: No Duty to Warn:no Physical Aggression / Violence:No  Access to Firearms  a concern: No  Gang Involvement:No  Patient / guardian was educated about steps to take if suicide or homicide risk level increases between visits: yes While future psychiatric events cannot be accurately predicted, the patient does not currently require acute inpatient psychiatric care and does not currently meet [facility] involuntary commitment criteria.  Subjective:  Shared progress over the last month. She stated she is pregnant [redacted] wks. Went to the ER due to falling 2 weeks ago due to dehydration.  She stated she and husband continue to work on their relationship. She stated she knows her hormones play a role lately in her mood. She stated they have had arguments, she continues to harbor hurt and anger due to his cheating on her. They were going to couples counseling but stopped due to his job. She feels some guilt due to how she has been treating her husband, having needs due to being pregnant.  She shared how the often avoid talking about his infidelity, that it typically ends up with an argument her getting upset.  The plan to reengage in couples counseling at some point, she feels it will be helpful.  Explored self-care with patient.  Patient is hopeful her nausea related to her pregnancy will continue to dissipate.  Some communication strategies to utilize in the relationship were discussed.  Interventions: Assessment, CBT, supportive therapy  Diagnoses: MDD, Severe, w/o psychotic features;  GAD  Individualized Plan of Care:  1. Patient to engage psychiatric evaluation and follow medication regimen.  2. Patient to engage in individual psychotherapy.  3. Patient to identify and apply coping skills learned in  session to decrease symptoms  4. Patient to learn and apply CBT, coping skills and strategies learned in session.  5. Patient to contact this office, go to the local ED or call 911 if a crisis or emergency develops between visits.   Anson [place], [facility]

## 2019-06-05 ENCOUNTER — Encounter (HOSPITAL_COMMUNITY): Payer: Self-pay

## 2019-06-05 ENCOUNTER — Other Ambulatory Visit: Payer: Self-pay

## 2019-06-05 ENCOUNTER — Inpatient Hospital Stay (HOSPITAL_COMMUNITY)
Admission: AD | Admit: 2019-06-05 | Discharge: 2019-06-06 | Disposition: A | Discharge status: Home / Self Care | Payer: No Typology Code available for payment source | Attending: Obstetrics and Gynecology | Admitting: Obstetrics and Gynecology

## 2019-06-05 DIAGNOSIS — O211 Hyperemesis gravidarum with metabolic disturbance: Secondary | ICD-10-CM | POA: Diagnosis not present

## 2019-06-05 DIAGNOSIS — Z3A14 14 weeks gestation of pregnancy: Secondary | ICD-10-CM | POA: Diagnosis not present

## 2019-06-05 DIAGNOSIS — E860 Dehydration: Secondary | ICD-10-CM

## 2019-06-05 DIAGNOSIS — O210 Mild hyperemesis gravidarum: Secondary | ICD-10-CM | POA: Diagnosis present

## 2019-06-05 LAB — COMPREHENSIVE METABOLIC PANEL
ALT: 27 U/L (ref 0–44)
AST: 26 U/L (ref 15–41)
Albumin: 3.4 g/dL — ABNORMAL LOW (ref 3.5–5.0)
Alkaline Phosphatase: 36 U/L — ABNORMAL LOW (ref 38–126)
Anion gap: 10 (ref 5–15)
BUN: 8 mg/dL (ref 6–20)
CO2: 21 mmol/L — ABNORMAL LOW (ref 22–32)
Calcium: 9.1 mg/dL (ref 8.9–10.3)
Chloride: 103 mmol/L (ref 98–111)
Creatinine, Ser: 0.66 mg/dL (ref 0.44–1.00)
GFR calc Af Amer: >60 mL/min (ref >60)
GFR calc non Af Amer: >60 mL/min (ref >60)
Glucose, Bld: 104 mg/dL — ABNORMAL HIGH (ref 70–99)
Potassium: 3.6 mmol/L (ref 3.5–5.1)
Sodium: 134 mmol/L — ABNORMAL LOW (ref 135–145)
Total Bilirubin: 0.7 mg/dL (ref 0.3–1.2)
Total Protein: 6.7 g/dL (ref 6.5–8.1)

## 2019-06-05 LAB — URINALYSIS, ROUTINE W REFLEX MICROSCOPIC
Bilirubin Urine: NEGATIVE
Glucose, UA: NEGATIVE mg/dL
Hgb urine dipstick: NEGATIVE
Ketones, ur: 80 mg/dL — AB
Nitrite: NEGATIVE
Protein, ur: 30 mg/dL — AB
Specific Gravity, Urine: 1.021 (ref 1.005–1.030)
pH: 7 (ref 5.0–8.0)

## 2019-06-05 LAB — CBC
HCT: 40 % (ref 36.0–46.0)
Hemoglobin: 13.7 g/dL (ref 12.0–15.0)
MCH: 30.4 pg (ref 26.0–34.0)
MCHC: 34.3 g/dL (ref 30.0–36.0)
MCV: 88.9 fL (ref 80.0–100.0)
Platelets: 216 10*3/uL (ref 150–400)
RBC: 4.5 MIL/uL (ref 3.87–5.11)
RDW: 12.8 % (ref 11.5–15.5)
WBC: 9.3 10*3/uL (ref 4.0–10.5)
nRBC: 0 % (ref 0.0–0.2)

## 2019-06-05 MED ORDER — PROMETHAZINE HCL 25 MG/ML IJ SOLN
25.0000 mg | Freq: Once | INTRAVENOUS | Status: AC
  Administered 2019-06-05: 25 mg via INTRAVENOUS
  Filled 2019-06-05: qty 1

## 2019-06-05 MED ORDER — DIPHENHYDRAMINE HCL 50 MG/ML IJ SOLN
25.0000 mg | Freq: Once | INTRAMUSCULAR | Status: AC
  Administered 2019-06-05: 25 mg via INTRAVENOUS
  Filled 2019-06-05: qty 1

## 2019-06-05 MED ORDER — ONDANSETRON 8 MG PO TBDP: 8.0000 mg | ORAL_TABLET | Freq: Three times a day (TID) | ORAL | 2 refills | Status: DC | PRN

## 2019-06-05 MED ORDER — DEXTROSE 5 % IN LACTATED RINGERS IV BOLUS
1000.0000 mL | Freq: Once | INTRAVENOUS | Status: AC
  Administered 2019-06-05: 1000 mL via INTRAVENOUS

## 2019-06-05 MED ORDER — SODIUM CHLORIDE 0.9 % IV SOLN
8.0000 mg | Freq: Once | INTRAVENOUS | Status: AC
  Administered 2019-06-05: 8 mg via INTRAVENOUS
  Filled 2019-06-05: qty 4

## 2019-06-05 NOTE — MAU Provider Note (Addendum)
Chief Complaint: Emesis, Nausea, and Abdominal Pain   First Provider Initiated Contact with Patient 06/05/19 1954     SUBJECTIVE HPI: Julie Webb is a 28 y.o. [id number] at [redacted]w[redacted]d who presents to Maternity Admissions reporting exacerbation of nausea and vomiting of pregnancy since yesterday.  Stopped taking Phenergan and Zofran a week ago since nausea and vomiting had improved significantly.  Reports that she has vomited approximately 40 times since yesterday.  Has prescriptions for Phenergan and Zofran tablets, but has been unable to keep them down.  Feels like Phenergan is usually very effective, Zofran less so.  Associated signs and symptoms: Mild generalized abdominal pain that she attributes to frequent vomiting.  One episode of dizziness.  Negative for vaginal bleeding, fever, chills, sick contacts, diarrhea.  Had mild reaction to Phenergan when she was in maternity admissions last month where she experienced sensation of restlessness.  Resolved spontaneously.  Has not experienced the side effect with Phenergan tablets.  Is requesting IV Phenergan now because it works very well.  Past Medical History:  Diagnosis Date  . Anxiety   . Depression   . Glomerulonephritis, poststreptococcal    age 11  . 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 Migraines    OB History  Gravida Para Term Preterm AB Living  3       2    SAB TAB Ectopic Multiple Live Births  2            # Outcome Date GA Lbr Len/2nd Weight Sex Delivery Anes PTL Lv  3 Current           2 SAB 10/20/18 [redacted]w[redacted]d    SAB     1 SAB 10/20/14 [redacted]w[redacted]d          History reviewed. No pertinent surgical history. Social History   Socioeconomic History  . Marital status: Married    Spouse name: Not on file  . Number of children: Not on file  . Years of education: Not on file  . Highest education level: Not on file  Occupational History  . Not on file  Social Needs  . Financial resource strain: Not on file  . Food insecurity   Worry: Not on file    Inability: Not on file  . Transportation needs    Medical: Not on file    Non-medical: Not on file  Tobacco Use  . Smoking status: Never Smoker  . Smokeless tobacco: Never Used  Substance and Sexual Activity  . Alcohol use: Yes    Comment: occasionally- once a month if then pt reports  . Drug use: No  . Sexual activity: Yes    Birth control/protection: Condom, None  Lifestyle  . Physical activity    Days per week: Not on file    Minutes per session: Not on file  . Stress: Not on file  Relationships  . Social Herbalist on phone: Not on file    Gets together: Not on file    Attends religious service: Not on file    Active member of club or organization: Not on file    Attends meetings of clubs or organizations: Not on file    Relationship status: Not on file  . Intimate partner violence    Fear of current or ex partner: Not on file    Emotionally abused: Not on file    Physically abused: Not on file    Forced sexual activity: Not on file  Other Topics  Concern  . 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 Hyperlipidemia Mother   . Mental illness Mother   . Fibroids Mother        uterine  . Hypertension Father   . Heart attack Father   . Heart disease Maternal Grandmother   . Hyperlipidemia Maternal Grandmother   . Hypertension Maternal Grandmother   . Heart disease Maternal Grandfather   . Hyperlipidemia Maternal Grandfather   . Hypertension Maternal Grandfather   . Pancreatic cancer Paternal Grandmother   . Mesothelioma Paternal Grandfather    No current facility-administered medications on file prior to encounter.    Current Outpatient Medications on File Prior to Encounter  Medication Sig Dispense Refill  . DULoxetine  (CYMBALTA) 60 MG capsule Take 1 capsule (60 mg total) by mouth daily. 90 capsule 1  . butalbital-acetaminophen-caffeine (FIORICET, ESGIC) 50-325-40 MG tablet Take 1 tablet by mouth every 6 (six) hours as needed for headache. 12 tablet 5  . fluconazole (DIFLUCAN) 150 MG tablet Take 1 tab by mouth today. May repeat in 3 days if not improved. 2 tablet 0  . Prenat w/o A-FeCbn-DSS-FA-DHA (PRENAISSANCE PLUS) 28-1-250 MG CAPS Take 1 capsule by mouth daily.    . progesterone (PROMETRIUM) 200 MG capsule Take 200 mg by mouth daily.    . promethazine (PHENERGAN) 12.5 MG tablet Take 2 tablets (25 mg total) by mouth every 6 (six) hours as needed for nausea or vomiting. 40 tablet 3  . verapamil (CALAN-SR) 120 MG CR tablet Take 120 mg by mouth daily.     Allergies  Allergen Reactions  . Ibuprofen Other (See Comments)    Kidney failure  . Phenergan [Promethazine Hcl] Other (See Comments)    Restlessness w/ IV Phenergen. Probable mild EPS. Tolerates if taken w/ Benadryl.     I have reviewed patient's Past Medical Hx, Surgical Hx, Family Hx, Social Hx, medications and allergies.   Review of Systems  Constitutional: Negative for appetite change, chills and fever.  Gastrointestinal: Positive for abdominal pain (Mild generalized).  Genitourinary: Negative for vaginal bleeding and vaginal discharge.  Neurological: Positive for dizziness.    OBJECTIVE Patient Vitals for the past 24 hrs:  BP Temp Temp src Pulse Resp SpO2 Height Weight  06/05/19 1911 117/78 - - 100 20 - - -  06/05/19 1845 103/67 98.5 F (36.9 C) Oral (!) 113 18 99 % - -  06/05/19 1839 - - - - - - 5\' 4"  (1.626 m) 56.1 kg   Constitutional: Well-developed, well-nourished female in no acute distress.  Head: Mucous membranes moist. Cardiovascular: Mild tachycardia Respiratory: normal rate and effort.  GI: Abd soft, non-tender, gravid appropriate for gestational age.  Neurologic: Alert and oriented x 4.  GU: Deferred Fetal heart rate 155 by  Doppler.  LAB RESULTS Results for orders placed or performed during the hospital encounter of 06/05/19 (from the past 24 hour(s))  CBC     Status: None   Collection Time: 06/05/19  7:28 PM  Result Value Ref Range   WBC 9.3 4.0 - 10.5 K/uL   RBC 4.50 3.87 - 5.11 MIL/uL   Hemoglobin 13.7 12.0 - 15.0 g/dL   HCT 40.0 36.0 - 46.0 %   MCV 88.9  80.0 - 100.0 fL   MCH 30.4 26.0 - 34.0 pg   MCHC 34.3 30.0 - 36.0 g/dL   RDW 12.8 11.5 - 15.5 %   Platelets 216 150 - 400 K/uL   nRBC 0.0 0.0 - 0.2 %  Comprehensive metabolic panel     Status: Abnormal   Collection Time: 06/05/19  7:28 PM  Result Value Ref Range   Sodium 134 (L) 135 - 145 mmol/L   Potassium 3.6 3.5 - 5.1 mmol/L   Chloride 103 98 - 111 mmol/L   CO2 21 (L) 22 - 32 mmol/L   Glucose, Bld 104 (H) 70 - 99 mg/dL   BUN 8 6 - 20 mg/dL   Creatinine, Ser 0.66 0.44 - 1.00 mg/dL   Calcium 9.1 8.9 - 10.3 mg/dL   Total Protein 6.7 6.5 - 8.1 g/dL   Albumin 3.4 (L) 3.5 - 5.0 g/dL   AST 26 15 - 41 U/L   ALT 27 0 - 44 U/L   Alkaline Phosphatase 36 (L) 38 - 126 U/L   Total Bilirubin 0.7 0.3 - 1.2 mg/dL   GFR calc non Af Amer >60 >60 mL/min   GFR calc Af Amer >60 >60 mL/min   Anion gap 10 5 - 15  Urinalysis, Routine w reflex microscopic     Status: Abnormal   Collection Time: 06/05/19  9:02 PM  Result Value Ref Range   Color, Urine YELLOW YELLOW   APPearance HAZY (A) CLEAR   Specific Gravity, Urine 1.021 1.005 - 1.030   pH 7.0 5.0 - 8.0   Glucose, UA NEGATIVE NEGATIVE mg/dL   Hgb urine dipstick NEGATIVE NEGATIVE   Bilirubin Urine NEGATIVE NEGATIVE   Ketones, ur 80 (A) NEGATIVE mg/dL   Protein, ur 30 (A) NEGATIVE mg/dL   Nitrite NEGATIVE NEGATIVE   Leukocytes,Ua LARGE (A) NEGATIVE   RBC / HPF 0-5 0 - 5 RBC/hpf   WBC, UA 21-50 0 - 5 WBC/hpf   Bacteria, UA RARE (A) NONE SEEN   Squamous Epithelial / LPF 0-5 0 - 5   Mucus PRESENT     IMAGING No results found.  MAU COURSE Orders Placed This Encounter  Procedures  . Urinalysis,  Routine w reflex microscopic  . CBC  . Comprehensive metabolic panel  . Insert peripheral IV  . Discharge patient   Meds ordered this encounter  Medications  . promethazine (PHENERGAN) 25 mg in lactated ringers 1,000 mL infusion  . diphenhydrAMINE (BENADRYL) injection 25 mg  . ondansetron (ZOFRAN) 8 mg in sodium chloride 0.9 % 50 mL IVPB  . dextrose 5% lactated ringers bolus 1,000 mL  . ondansetron (ZOFRAN ODT) 8 MG disintegrating tablet    Sig: Take 1 tablet (8 mg total) by mouth every 8 (eight) hours as needed for nausea or vomiting.    Dispense:  20 tablet    Refill:  2    Order Specific Question:   Supervising Provider    Answer:   Jonnie Kind [[id number]]   Benadryl given with IV Phenergan to prevent side effect of restlessness.  Care of patient turned over to Julianne Handler, CNM at [place], [place] reviewed. Tolerating po. No further emesis. Feeling better. Stable for discharge home.  ASSESSMENT 1. Hyperemesis gravidarum before end of [redacted] week gestation with dehydration   2. [redacted] weeks gestation of pregnancy   3. Dehydration    PLAN Discharge home in stable condition. Hyperemesis precautions Suggested Phenergan suppositories or using tablets  rectally or vaginally when unable to keep down pills.  Patient states she will use the tablets that she already has.  Will send Zofran ODT to her pharmacy as well. Clear liquids x24 hours, then advance slowly Follow-[facility] with [facility] as scheduled or sooner as needed if symptoms worsen. Return to maternity assessment unit as needed in pregnancy emergencies.  Allergies as of 06/05/2019      Reactions   Ibuprofen Other (See Comments)   Kidney failure   Phenergan [promethazine Hcl] Other (See Comments)   Restlessness w/ IV Phenergen. Probable mild EPS. Tolerates if taken w/ Benadryl.       Medication List    TAKE these medications   butalbital-acetaminophen-caffeine 50-325-40 MG tablet Commonly known as:  FIORICET Take 1 tablet by mouth every 6 (six) hours as needed for headache.   DULoxetine 60 MG capsule Commonly known as: CYMBALTA Take 1 capsule (60 mg total) by mouth daily.   fluconazole 150 MG tablet Commonly known as: DIFLUCAN Take 1 tab by mouth today. May repeat in 3 days if not improved.   ondansetron 8 MG disintegrating tablet Commonly known as: Zofran ODT Take 1 tablet (8 mg total) by mouth every 8 (eight) hours as needed for nausea or vomiting.   Prenaissance Plus 28-1-250 MG Caps Take 1 capsule by mouth daily.   progesterone 200 MG capsule Commonly known as: PROMETRIUM Take 200 mg by mouth daily.   promethazine 12.5 MG tablet Commonly known as: PHENERGAN Take 2 tablets (25 mg total) by mouth every 6 (six) hours as needed for nausea or vomiting.   verapamil 120 MG CR tablet Commonly known as: CALAN-SR Take 120 mg by mouth daily.      Julianne Handler, CNM  06/05/2019 11:43 PM

## 2019-06-05 NOTE — MAU Note (Signed)
Julie Webb is a 28 y.o. at [redacted]w[redacted]d here in MAU reporting: been having really bad morning sickness, was on phenergan and her doctor told her to stop taking it in the 2nd trimester. States the past 2 days it's gotten worse and she can't keep anything down. SO states she is dehydrated and she "passed out a little". Having intermittent abdominal pain but no bleeding.   Onset of complaint: ongoing but gotten worse the past 2 days  Pain score: 3/10  Vitals:   06/05/19 1845  BP: 103/67  Pulse: (!) 113  Resp: 18  Temp: 98.5 F (36.9 C)  SpO2: 99%      Lab orders placed from triage: UA, unable to give sample at this time

## 2019-06-05 NOTE — Discharge Instructions (Signed)
Hyperemesis Gravidarum Hyperemesis gravidarum is a severe form of nausea and vomiting that happens during pregnancy. Hyperemesis is worse than morning sickness. It may cause you to have nausea or vomiting all day for many days. It may keep you from eating and drinking enough food and liquids, which can lead to dehydration, malnutrition, and weight loss. Hyperemesis usually occurs during the first half (the first 20 weeks) of pregnancy. It often goes away once a woman is in her second half of pregnancy. However, sometimes hyperemesis continues through an entire pregnancy. What are the causes? The cause of this condition is not known. It may be related to changes in chemicals (hormones) in the body during pregnancy, such as the high level of pregnancy hormone (human chorionic gonadotropin) or the increase in the female sex hormone (estrogen). What are the signs or symptoms? Symptoms of this condition include:  Nausea that does not go away.  Vomiting that does not allow you to keep any food down.  Weight loss.  Body fluid loss (dehydration).  Having no desire to eat, or not liking food that you have previously enjoyed. How is this diagnosed? This condition may be diagnosed based on:  A physical exam.  Your medical history.  Your symptoms.  Blood tests.  Urine tests. How is this treated? This condition is managed by controlling symptoms. This may include:  Following an eating plan. This can help lessen nausea and vomiting.  Taking prescription medicines. An eating plan and medicines are often used together to help control symptoms. If medicines do not help relieve nausea and vomiting, you may need to receive fluids through an IV at the hospital. Follow these instructions at home: Eating and drinking   Avoid the following: ? Drinking fluids with meals. Try not to drink anything during the 30 minutes before and after your meals. ? Drinking more than 1 cup of fluid at a  time. ? Eating foods that trigger your symptoms. These may include spicy foods, coffee, high-fat foods, very sweet foods, and acidic foods. ? Skipping meals. Nausea can be more intense on an empty stomach. If you cannot tolerate food, do not force it. Try sucking on ice chips or other frozen items and make up for missed calories later. ? Lying down within 2 hours after eating. ? Being exposed to environmental triggers. These may include food smells, smoky rooms, closed spaces, rooms with strong smells, warm or humid places, overly loud and noisy rooms, and rooms with motion or flickering lights. Try eating meals in a well-ventilated area that is free of strong smells. ? Quick and sudden changes in your movement. ? Taking iron pills and multivitamins that contain iron. If you take prescription iron pills, do not stop taking them unless your health care provider approves. ? Preparing food. The smell of food can spoil your appetite or trigger nausea.  To help relieve your symptoms: ? Listen to your body. Everyone is different and has different preferences. Find what works best for you. ? Eat and drink slowly. ? Eat 5-6 small meals daily instead of 3 large meals. Eating small meals and snacks can help you avoid an empty stomach. ? In the morning, before getting out of bed, eat a couple of crackers to avoid moving around on an empty stomach. ? Try eating starchy foods as these are usually tolerated well. Examples include cereal, toast, bread, potatoes, pasta, rice, and pretzels. ? Include at least 1 serving of protein with your meals and snacks. Protein options include  lean meats, poultry, seafood, beans, nuts, nut butters, eggs, cheese, and yogurt. ? Try eating a protein-rich snack before bed. Examples of a protein-rick snack include cheese and crackers or a peanut butter sandwich made with 1 slice of whole-wheat bread and 1 tsp (5 g) of peanut butter. ? Eat or suck on things that have ginger in them.  It may help relieve nausea. Add  tsp ground ginger to hot tea or choose ginger tea. ? Try drinking 100% fruit juice or an electrolyte drink. An electrolyte drink contains sodium, potassium, and chloride. ? Drink fluids that are cold, clear, and carbonated or sour. Examples include lemonade, ginger ale, lemon-lime soda, ice water, and sparkling water. ? Brush your teeth or use a mouth rinse after meals. ? Talk with your health care provider about starting a supplement of vitamin B6. General instructions  Take over-the-counter and prescription medicines only as told by your health care provider.  Follow instructions from your health care provider about eating or drinking restrictions.  Continue to take your prenatal vitamins as told by your health care provider. If you are having trouble taking your prenatal vitamins, talk with your health care provider about different options.  Keep all follow-up and pre-birth (prenatal) visits as told by your health care provider. This is important. Contact a health care provider if:  You have pain in your abdomen.  You have a severe headache.  You have vision problems.  You are losing weight.  You feel weak or dizzy. Get help right away if:  You cannot drink fluids without vomiting.  You vomit blood.  You have constant nausea and vomiting.  You are very weak.  You faint.  You have a fever and your symptoms suddenly get worse. Summary  Hyperemesis gravidarum is a severe form of nausea and vomiting that happens during pregnancy.  Making some changes to your eating habits may help relieve nausea and vomiting.  This condition may be managed with medicine.  If medicines do not help relieve nausea and vomiting, you may need to receive fluids through an IV at the hospital. This information is not intended to replace advice given to you by your health care provider. Make sure you discuss any questions you have with your health care  provider. Document Released: 10/06/2005 Document Revised: 10/26/2017 Document Reviewed: 06/04/2016 Elsevier Patient Education  2020 [employer].

## 2019-06-06 MED FILL — ONDANSETRON ODT 8 MG TABLET: 8 | 6 days supply | Qty: 20 | Fill #0

## 2019-06-19 NOTE — Progress Notes (Signed)
Error. Pt not seen

## 2019-06-20 MED FILL — ONDANSETRON ODT 8 MG TABLET: 8 | 6 days supply | Qty: 20 | Fill #0

## 2019-06-20 MED FILL — PROMETHAZINE 12.5 MG TABLET: 12.5 | 5 days supply | Qty: 40 | Fill #1

## 2019-06-20 MED FILL — PRENAISSANCE PLUS SOFTGEL: 28-1-250 | 30 days supply | Qty: 30 | Fill #2

## 2019-06-21 ENCOUNTER — Other Ambulatory Visit: Payer: Self-pay

## 2019-06-21 ENCOUNTER — Ambulatory Visit: Payer: No Typology Code available for payment source | Admitting: Mental Health

## 2019-06-21 DIAGNOSIS — F330 Major depressive disorder, recurrent, mild: Secondary | ICD-10-CM

## 2019-06-21 NOTE — Progress Notes (Signed)
error 

## 2019-07-05 MED FILL — PROMETHAZINE 12.5 MG TABLET: 12.5 | 5 days supply | Qty: 40 | Fill #2

## 2019-07-05 MED FILL — ONDANSETRON ODT 8 MG TABLET: 8 | 6 days supply | Qty: 20 | Fill #1

## 2019-07-20 ENCOUNTER — Other Ambulatory Visit: Payer: Self-pay

## 2019-07-20 ENCOUNTER — Encounter: Payer: Self-pay | Admitting: Psychiatry

## 2019-07-20 ENCOUNTER — Ambulatory Visit (INDEPENDENT_AMBULATORY_CARE_PROVIDER_SITE_OTHER): Payer: No Typology Code available for payment source | Admitting: Psychiatry

## 2019-07-20 VITALS — Ht 64.0 in | Wt 146.0 lb

## 2019-07-20 DIAGNOSIS — F3341 Major depressive disorder, recurrent, in partial remission: Secondary | ICD-10-CM | POA: Diagnosis not present

## 2019-07-20 DIAGNOSIS — F410 Panic disorder [episodic paroxysmal anxiety] without agoraphobia: Secondary | ICD-10-CM | POA: Diagnosis not present

## 2019-07-20 DIAGNOSIS — F411 Generalized anxiety disorder: Secondary | ICD-10-CM | POA: Diagnosis not present

## 2019-07-20 DIAGNOSIS — F4010 Social phobia, unspecified: Secondary | ICD-10-CM | POA: Diagnosis not present

## 2019-07-20 MED ORDER — DULOXETINE HCL 60 MG PO CPEP: 60.0000 mg | ORAL_CAPSULE | Freq: Every day | ORAL | 0 refills | Status: DC

## 2019-07-20 MED FILL — DULoxetine HCL 60 MG CPEP: 60 | 90 days supply | Qty: 90 | Fill #0

## 2019-07-20 NOTE — Progress Notes (Signed)
[facility]  Patient ID: Julie Webb,  MRN: [id number]  PCP: Debbrah Alar, NP  Date of Evaluation: 07/20/2019 Time spent:20 minutes from 1340 to 1400  Chief Complaint:  Chief Complaint    Depression; Anxiety; Panic Attack      HISTORY/CURRENT STATUS: Julie Webb is seen onsite in office 20 minutes face-to-face individually with consent with epic collateral for psychiatric interview and exam in 35-month evaluation and management of major depression and social/generalized/panic anxiety now comorbid with pregnancy.  Since last appointment, she has few if any headaches despite stopping her verapamil though worried that she will have headaches again postpartum when breast-feeding, processing similar concern for postpartum depression.  She continues Cymbalta 60 mg nightly and Cymbalta does not have significant breast milk concentrations though still warranting observing the infant for any drowsiness routinely.  She is off progesterone since 13 weeks, and pregnancy is advancing with excellent weight gain and advancing fetal size.  However she has required inpatient twice for hyperemesis now using both Phenergan and Zofran to control the 24-hour nausea with vulnerability to emesis.  She is secure with her obstetrician.  She is 21 weeks naming baby [name] husband mourning not getting to see the ultrasounds and exams but doing better in his mental health now having a job change.  She notes that the baby will likely be fidgety as is the father.  Still takes her baby aspirin and prenatal vitamin.  She still has therapy with Lanetta Inch, [facility] and prepares today for postpartum and labor.  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  She has no mania, suicidality, psychosis or delirium, and her mild major depression is now in partial remission.  Depression      The patient presents  withdepression as a recurrentproblem with current episode starting more than 4 months ago. The onset quality is sudden. The problem occurs intermittently.The problem is now partially remittedsince onset.Associated symptoms include decreased concentration,fatigue,and body aches. Associated symptoms include no decreased interest, no hopelessnessand not irritable.The symptoms are aggravated by work stress, medication, social issues and family issues.Past treatments include SSRIs, other medications, and psychotherapy.Compliance with treatment is poor to variable.Past compliance problems include social anxiety, medical issues, difficulty with treatment plan and medication issues.Previous treatment provided moderaterelief.Risk factors include family history, a change in medication usage/dosage, the patient not taking medications correctly, emotional abuse, prior traumatic experience, prior psychiatric admission, major life event, family history of mental illness, history of mental illness, history of self-injury and history of suicide attempt. Past medical history includes thyroid problem,recent illness,anxiety,depression,mental health disorder,schizophreniaand suicide attempts. Pertinent negatives include no chronic pain,no terminal illness,no bipolar disorder,no eating disorder,no obsessive-compulsive disorder,no post-traumatic stress disorderand no head trauma.  Individual Medical History/ Review of Systems: Changes? :Yes [redacted] weeks gestation with adequate fetal growth and weight gain having hyperemesis gravidarum but no migraines, off Calan but on Zoloft and Phenergan  Allergies: Ibuprofen and Phenergan [promethazine hcl]  Current Medications:  Current Outpatient Medications:  .  butalbital-acetaminophen-caffeine (FIORICET, ESGIC) 50-325-40 MG tablet, Take 1 tablet by mouth every 6 (six) hours as needed for headache., Disp: 12 tablet, Rfl: 5 .  DULoxetine  (CYMBALTA) 60 MG capsule, Take 1 capsule (60 mg total) by mouth daily., Disp: 90 capsule, Rfl: 1 .  fluconazole (DIFLUCAN) 150 MG tablet, Take 1 tab by mouth today. May repeat in 3 days if not improved., Disp: 2 tablet, Rfl:  0 .  ondansetron (ZOFRAN ODT) 8 MG disintegrating tablet, Take 1 tablet (8 mg total) by mouth every 8 (eight) hours as needed for nausea or vomiting., Disp: 20 tablet, Rfl: 2 .  Prenat w/o A-FeCbn-DSS-FA-DHA (PRENAISSANCE PLUS) 28-1-250 MG CAPS, Take 1 capsule by mouth daily., Disp: , Rfl:  .  progesterone (PROMETRIUM) 200 MG capsule, Take 200 mg by mouth daily., Disp: , Rfl:  .  promethazine (PHENERGAN) 12.5 MG tablet, Take 2 tablets (25 mg total) by mouth every 6 (six) hours as needed for nausea or vomiting., Disp: 40 tablet, Rfl: 3 .  verapamil (CALAN-SR) 120 MG CR tablet, Take 120 mg by mouth daily., Disp: , Rfl:    Medication Side Effects: none  Family Medical/ Social History: Changes? Yes, husband now being much more stable and his mental health also.  MENTAL HEALTH EXAM:  Height 5\' 4"  (1.626 m), weight 146 lb (66.2 kg), last menstrual period 02/23/2019.Body mass index is 25.06 kg/m.  Others deferred for coronavirus shutdown  General Appearance: Casual and Well Groomed  Eye Contact:  Good  Speech:  Clear and Coherent, Normal Rate and Talkative  Volume:  Normal  Mood:  Anxious, Dysphoric and Euthymic  Affect:  Congruent, Full Range and Anxious  Thought Process:  Coherent, Irrelevant and Descriptions of Associations: Circumstantial and episodically loose to monitor closely postpartum  Orientation:  Full (Time, Place, and Person)  Thought Content: Ilusions, Obsessions and Rumination   Suicidal Thoughts:  No  Homicidal Thoughts:  No  Memory:  Immediate;   Good Remote;   Good  Judgement:  Good  Insight:  Good  Psychomotor Activity:  Normal and Mannerisms  Concentration:  Concentration: Fair and Attention Span: Good  Recall:  Good  Fund of Knowledge: Good   Language: Good  Assets:  Desire for Improvement Intimacy Leisure Time Social Support  ADL's:  Intact  Cognition: WNL  Prognosis:  Good    DIAGNOSES:    ICD-10-CM   1. Recurrent major depression in partial remission (HCC)  F33.41   2. Social anxiety disorder  F40.10   3. Generalized anxiety disorder  F41.1   4. Panic disorder  F41.0     Receiving Psychotherapy: Yes Lanetta Inch, [facility]  RECOMMENDATIONS: Psychosupportive psychoeducation contrasts with patient the forces of her stabilization and security in pregnancy.  She has physiologic integration when prior to pregnancy she was often autonomically unstable and dysfunctional.  She has had some nausea and vomiting of pregnancy but such has been briefly supported physiologically for confidence and psychological recompensation with patient overcoming the expected obstacles to maintaining the pregnancy as well as being psychologically prepared for delivery and postpartum.  We process the potential instability of cognition and identity in postpartum depression and psychosis as patient prepares for delivery and lactation.  Overal 50% of the 20-minute face-to-face time is spent in total of 10 minutes counseling and coordination of care patient arriving 20 minutes late for appointment without significant  discomfort or guilt.  Cognitive behavioral nutrition, sleep hygiene, stress management and frustration tolerance prepare for such upcoming goals and symptom treatment matching especially with medication.  Cymbalta must continue 60 mg sent as #90 with no refill and E scription to [facility] outpatient pharmacy for social, panic, and generalized anxiety with history of partially remitted recurrent major depression.  Baby [name] now [redacted] weeks gestation is doing as well as mother, and father is improving simultaneously.  We process goals of therapy with Lanetta Inch, [facility] including to become potentially conjoint with  husband who feels left out by [employer].  She returns in 3 to 4 months for follow-up or sooner if any relapse occurs.   Delight Hoh, MD

## 2019-08-10 MED FILL — DULoxetine HCL 60 MG CPEP: 60 | 90 days supply | Qty: 90 | Fill #0

## 2019-08-10 MED FILL — PROMETHAZINE 12.5 MG TABLET: 12.5 | 5 days supply | Qty: 40 | Fill #3

## 2019-08-10 MED FILL — PRENAISSANCE PLUS SOFTGEL: 28-1-250 | 30 days supply | Qty: 30 | Fill #3

## 2019-08-10 MED FILL — ONDANSETRON ODT 8 MG TABLET: 8 | 6 days supply | Qty: 20 | Fill #2

## 2019-09-07 ENCOUNTER — Encounter: Payer: Self-pay | Admitting: Physical Therapy

## 2019-09-07 ENCOUNTER — Ambulatory Visit
Payer: No Typology Code available for payment source | Attending: Obstetrics and Gynecology | Admitting: Physical Therapy

## 2019-09-07 ENCOUNTER — Other Ambulatory Visit: Payer: Self-pay

## 2019-09-07 DIAGNOSIS — R293 Abnormal posture: Secondary | ICD-10-CM | POA: Insufficient documentation

## 2019-09-07 NOTE — Therapy (Signed)
[place], [place], [address] Phone: [phone]   Fax:  [phone]  Physical Therapy Evaluation  Patient Details  Name: Julie Webb MRN: [id number] Date of Birth: 1991/07/29 Referring Provider (PT): Brien Few, MD   Encounter Date: 09/07/2019  PT End of Session - 09/07/19 1218    Visit Number  1    Number of Visits  17    Date for PT Re-Evaluation  11/04/19    Authorization Type  [facility] FOCUS    PT Start Time  [id number]   pt arrived late   PT Stop Time  1020    PT Time Calculation (min)  38 min    Activity Tolerance  Patient tolerated treatment well    Behavior During Therapy  [facility] for tasks assessed/performed       Past Medical History:  Diagnosis Date  . Anxiety   . Depression   . Glomerulonephritis, poststreptococcal    age 7  . 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 Migraines     History reviewed. No pertinent surgical history.  There were no vitals filed for this visit.   Subjective Assessment - 09/07/19 0946    Subjective  I have a deformity- Rt ribs are collapsed and Lt stick out, prior to pregnancy pain was on Lt but now its on Rt. Out of work due to pain, hard to walk and carry purse in Rt arm. All activity is aggrivating. Feels like ribs are broken around mid day.    Currently in Pain?  Yes    Pain Score  --   moderate now   Pain Location  Rib cage    Pain Orientation  Right    Pain Descriptors / Indicators  Sharp    Aggravating Factors   pain progresses during the day no matter what I do    Pain Relieving Factors  reach Rt arm OH and SB left         [facility] PT Assessment - 09/07/19 0001      Assessment   Medical Diagnosis  Rt rib pain    Referring Provider (PT)  Brien Few, MD    Onset Date/Surgical Date  --   2 months ago on Rt, chronic on Lt prior to pregnancy   Prior Therapy  no      Precautions   Precaution Comments  [redacted] wks pregnant      Restrictions   Weight Bearing Restrictions  No      Balance Screen   Has the patient fallen in the past 6 months  No      Prior Function   Level of Independence  Independent    Vocation  Full time employment    Vocation Requirements  ICU nurse      Cognition   Overall Cognitive Status  Within Functional Limits for tasks assessed      Sensation   Additional Comments  [facility]      Posture/Postural Control   Posture Comments  Rt T10 rib depression/Lt elevation; longitudinal rotation Rt pelvis anterior      Palpation   Palpation comment  concordant TTP ribs just beneath Rt breast                Objective measurements completed on examination: See above findings.      [facility] Adult PT Treatment/Exercise - 09/07/19 0001      Exercises   Exercises  Other Exercises    Other Exercises  PRI 90-90 hip lift ith Rt arm reach (feet on table)      Manual Therapy   Manual Therapy  Soft tissue mobilization    Soft tissue mobilization  STM between ribs beneath Rt breast             PT Education - 09/07/19 1220    Education Details  anatomy of condition, POC, HEP, exercise form/rationale    Person(s) Educated  Patient    Methods  Explanation;Demonstration;Tactile cues;Verbal cues;Handout    Comprehension  Verbalized understanding;Returned demonstration;Verbal cues required;Tactile cues required;Need further instruction       PT Short Term Goals - 09/07/19 1208      PT SHORT TERM GOAL #1   Title  Pt will be able to use small postures and breathing techniques throughout her day to keep pain at a manageable level    Baseline  began educating at eval    Time  3    Period  Weeks    Status  New    Target Date  09/30/19        PT Long Term Goals - 09/07/19 1209      PT LONG TERM GOAL #1   Title  Pt will be independent with long term HEP, with modifications in preparation for delivery    Baseline  will progress and establish as appropriate    Time  8    Period  Weeks    Status   New    Target Date  11/04/19      PT LONG TERM GOAL #2   Title  Pt will be able to participate in work shift as needed with pain <=4/10    Baseline  severe at limiting at eval    Time  8    Period  Weeks    Status  New    Target Date  11/04/19      PT LONG TERM GOAL #3   Title  Pt will be able to ambulate for at least 20 min without rest break    Baseline  severe pain and very limited in ambulation    Time  8    Period  Weeks    Status  New    Target Date  11/04/19             Plan - 09/07/19 1142    Clinical Impression Statement  Pt presents to PT with complaints of Rt rib pain that began about 2 months ago with pregnancy progression. She was told she had a "deformity" in her rib cage that has given her problems in Lt ribs for years. Concordant pain found in between ribs under Rt breast. Notable decreased space between ribs with trigger points that were decreased with manual therapy. Lt rib flare noted so PRI exercises were started today in supine-elevated and in seated. 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 Pt will benefit from skilled PT in order to decrease Lt rib flare to expand Rt ribs and assist in pain control as pregnancy progresses.    Personal Factors and Comorbidities  Comorbidity 1    Comorbidities  [redacted] weeks pregnant    Examination-Activity Limitations  Bathing;Locomotion Level;Transfers;Bed Mobility;Reach Overhead;Bend;Sit;Sleep;Dressing;Stand;Lift    Examination-Participation Restrictions  Meal Prep;Cleaning;Community Activity;Driving    Stability/Clinical Decision Making  Evolving/Moderate complexity    Clinical Decision Making  Moderate    Rehab Potential  Good    PT Frequency  2x / week  PT Duration  8 weeks    PT Treatment/Interventions  ADLs/Self Care Home Management;Cryotherapy;Moist Heat;Functional mobility training;Therapeutic activities;Therapeutic exercise;Patient/family education;Neuromuscular re-education;Manual  techniques;Passive range of motion;Taping    PT Next Visit Plan  add Lt sidelying knee-toward-knee, continue STM to Rt ribs,  motions to do at work    PT Home Exercise Plan  PRI 90-90 hip lift with Rt arm reach    Consulted and Agree with Plan of Care  Patient       Patient will benefit from skilled therapeutic intervention in order to improve the following deficits and impairments:  Impaired UE functional use, Increased muscle spasms, Decreased activity tolerance, Pain, Improper body mechanics, Postural dysfunction  Visit Diagnosis: Abnormal posture - Plan: PT plan of care cert/re-cert     Problem List Patient Active Problem List   Diagnosis Date Noted  . Recurrent major depression in partial remission ([place]) 07/20/2019  . 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 Panic disorder 11/01/2018  . Generalized anxiety disorder 11/01/2018  . Chronic migraine 12/25/2017  . Suicidal behavior with attempted self-injury ([place])   . Tremor 10/05/2015  . Loss of weight 07/06/2015  . Social anxiety disorder 05/30/2015  . H/O acute post-streptococcal glomerulonephritis 05/30/2015  . Contraceptive management 05/30/2015  . Migraine 05/30/2015    Jw Covin C. Luman Holway PT, DPT 09/07/19 12:23 PM   [facility] [address] [place], [place], [address] Phone: [phone]   Fax:  [phone]  Name: Julie Webb MRN: [id number] Date of Birth: 10-31-1990

## 2019-09-08 ENCOUNTER — Ambulatory Visit: Payer: No Typology Code available for payment source | Admitting: Physical Therapy

## 2019-09-08 ENCOUNTER — Encounter: Payer: Self-pay | Admitting: Physical Therapy

## 2019-09-08 DIAGNOSIS — R293 Abnormal posture: Secondary | ICD-10-CM | POA: Diagnosis not present

## 2019-09-08 NOTE — Therapy (Signed)
[place], [place], [address] Phone: [phone]   Fax:  [phone]  Physical Therapy Treatment  Patient Details  Name: Julie Webb MRN: [id number] Date of Birth: Feb 25, 1991 Referring Provider (PT): Brien Few, MD   Encounter Date: 09/08/2019  PT End of Session - 09/08/19 1244    Visit Number  2    Number of Visits  17    Date for PT Re-Evaluation  11/04/19    Authorization Type  [employer] FOCUS    PT Start Time  1244   pt arrived late   PT Stop Time  1311    PT Time Calculation (min)  27 min    Activity Tolerance  Patient tolerated treatment well    Behavior During Therapy  [facility] for tasks assessed/performed       Past Medical History:  Diagnosis Date  . Anxiety   . Depression   . Glomerulonephritis, poststreptococcal    age 28  . 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 Migraines     History reviewed. No pertinent surgical history.  There were no vitals filed for this visit.  Subjective Assessment - 09/08/19 1249    Subjective  Decreased pain for a while until right before I went to bed, it hit with sharp pain. I hurt more early today, baby was shifting around a lot last night.                       [place] Adult PT Treatment/Exercise - 09/08/19 0001      Exercises   Other Exercises   PRI 90-90 hip lift ith Rt arm reach (feet on table); also practiced in seated today      Manual Therapy   Soft tissue mobilization  STM between ribs beneath Rt breast               PT Short Term Goals - 09/07/19 1208      PT SHORT TERM GOAL #1   Title  Pt will be able to use small postures and breathing techniques throughout her day to keep pain at a manageable level    Baseline  began educating at eval    Time  3    Period  Weeks    Status  New    Target Date  09/30/19        PT Long Term Goals - 09/07/19 1209      PT LONG TERM GOAL #1   Title  Pt will be  independent with long term HEP, with modifications in preparation for delivery    Baseline  will progress and establish as appropriate    Time  8    Period  Weeks    Status  New    Target Date  11/04/19      PT LONG TERM GOAL #2   Title  Pt will be able to participate in work shift as needed with pain <=4/10    Baseline  severe at limiting at eval    Time  8    Period  Weeks    Status  New    Target Date  11/04/19      PT LONG TERM GOAL #3   Title  Pt will be able to ambulate for at least 20 min without rest break    Baseline  severe pain and very limited in ambulation    Time  8    Period  Weeks  Status  New    Target Date  11/04/19            Plan - 09/08/19 1311    Clinical Impression Statement  Continued with manual therapy between ribs today that pt reports hurt worse today but did release to a sore sensation as expected. Reviewed exercise in supine and practiced seated position for performing while at work- advised that seated is not ideal but available to use PRN to decrease severe pain while at work. Discussed focusing breathing to expand Rt lower lobe of lung to open ribs which she was knowledgable  of from history of choir and singing. Will continue to progress as able.    PT Treatment/Interventions  ADLs/Self Care Home Management;Cryotherapy;Moist Heat;Functional mobility training;Therapeutic activities;Therapeutic exercise;Patient/family education;Neuromuscular re-education;Manual techniques;Passive range of motion;Taping    PT Next Visit Plan  add Lt sidelying knee-toward-knee, continue STM to Rt ribs    PT Home Exercise Plan  PRI 90-90 hip lift with Rt arm reach    Consulted and Agree with Plan of Care  Patient       Patient will benefit from skilled therapeutic intervention in order to improve the following deficits and impairments:  Impaired UE functional use, Increased muscle spasms, Decreased activity tolerance, Pain, Improper body mechanics, Postural  dysfunction  Visit Diagnosis: Abnormal posture     Problem List Patient Active Problem List   Diagnosis Date Noted  . Recurrent major depression in partial remission ([place]) 07/20/2019  . 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 Panic disorder 11/01/2018  . Generalized anxiety disorder 11/01/2018  . Chronic migraine 12/25/2017  . Suicidal behavior with attempted self-injury ([place])   . Tremor 10/05/2015  . Loss of weight 07/06/2015  . Social anxiety disorder 05/30/2015  . H/O acute post-streptococcal glomerulonephritis 05/30/2015  . Contraceptive management 05/30/2015  . Migraine 05/30/2015    Brylan Seubert C. Capria Cartaya PT, DPT 09/08/19 1:15 PM   [facility] [address] [place], [place], [address] Phone: [phone]   Fax:  [phone]  Name: Julie Webb MRN: [id number] Date of Birth: 05-16-1991

## 2019-09-09 ENCOUNTER — Ambulatory Visit: Payer: No Typology Code available for payment source | Admitting: Family

## 2019-09-13 ENCOUNTER — Other Ambulatory Visit: Payer: Self-pay

## 2019-09-13 ENCOUNTER — Ambulatory Visit (INDEPENDENT_AMBULATORY_CARE_PROVIDER_SITE_OTHER): Payer: No Typology Code available for payment source | Admitting: Family

## 2019-09-13 ENCOUNTER — Ambulatory Visit: Payer: No Typology Code available for payment source | Admitting: Physical Therapy

## 2019-09-13 DIAGNOSIS — M546 Pain in thoracic spine: Secondary | ICD-10-CM | POA: Diagnosis not present

## 2019-09-13 NOTE — Progress Notes (Signed)
Virtual Visit via Video Note  I connected with Julie Webb on 09/13/19 at  3:40 PM EST by a video enabled telemedicine application and verified that I am speaking with the correct person using two identifiers.  Location: Patient: home Provider: work    I discussed the limitations of evaluation and management by telemedicine and the availability of in person appointments. The patient expressed understanding and agreed to proceed.  History of Present Illness:  Patient is a 28 yr old female who presents today to discuss right lower "rib pain." Reports that she is currently 28 weeks and 6 days pregnant. She has been working with [facility]. She is working with Dr. Ronita Hipps she has been happy with his care.  Reports that she has a "deformed rib." GYN suggested a nerve block in the area but was unable to find any provider willing to do this procedure due to her pregnancy status.  Reports that pain started about 8 weeks ago and is worsening. Reports that pain is in the right rib at "T1", and radiates around to her back.  Hurts more with slouching, not as bad when she is laying down but hurts "all the time."   Tylenol does not help. She has tried lidocaine patches without improvement. .  Reports that pain is severe at times and is interfering with her ability to work.  She has been doing physical therapy without much improvement.  Past Medical History:  Diagnosis Date  . Anxiety   . Depression   . Glomerulonephritis, poststreptococcal    age 60  . 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 Migraines      Social History   Socioeconomic History  . Marital status: Married    Spouse name: Not on file  . Number of children: Not on file  . Years of education: Not on file  . Highest education level: Not on file  Occupational History  . Not on file  Social Needs  . Financial resource strain: Not on file  . Food insecurity    Worry: Not on file    Inability: Not on file  . Transportation  needs    Medical: Not on file    Non-medical: Not on file  Tobacco Use  . Smoking status: Never Smoker  . Smokeless tobacco: Never Used  Substance and Sexual Activity  . Alcohol use: Yes    Comment: occasionally- once a month if then pt reports  . Drug use: No  . Sexual activity: Yes    Birth control/protection: Condom, None  Lifestyle  . Physical activity    Days per week: Not on file    Minutes per session: Not on file  . Stress: Not on file  Relationships  . Social Herbalist on phone: Not on file    Gets together: Not on file    Attends religious service: Not on file    Active member of club or organization: Not on file    Attends meetings of clubs or organizations: Not on file    Relationship status: Not on file  . Intimate partner violence    Fear of current or ex partner: Not on file    Emotionally abused: Not on file    Physically abused: Not on file    Forced sexual activity: Not on file  Other Topics Concern  . 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  Family History  Problem Relation Age of Onset  . Hyperlipidemia Mother   . Mental illness Mother   . Fibroids Mother        uterine  . Hypertension Father   . Heart attack Father   . Heart disease Maternal Grandmother   . Hyperlipidemia Maternal Grandmother   . Hypertension Maternal Grandmother   . Heart disease Maternal Grandfather   . Hyperlipidemia Maternal Grandfather   . Hypertension Maternal Grandfather   . Pancreatic cancer Paternal Grandmother   . Mesothelioma Paternal Grandfather     Allergies  Allergen Reactions  . Ibuprofen Other (See Comments)    Kidney failure  . Phenergan [Promethazine Hcl] Other (See Comments)    Restlessness w/ IV Phenergen. Probable mild EPS. Tolerates if taken w/  Benadryl.     Current Outpatient Medications on File Prior to Visit  Medication Sig Dispense Refill  . butalbital-acetaminophen-caffeine (FIORICET, ESGIC) 50-325-40 MG tablet Take 1 tablet by mouth every 6 (six) hours as needed for headache. 12 tablet 5  . DULoxetine (CYMBALTA) 60 MG capsule Take 1 capsule (60 mg total) by mouth at bedtime. 90 capsule 0  . ondansetron (ZOFRAN ODT) 8 MG disintegrating tablet Take 1 tablet (8 mg total) by mouth every 8 (eight) hours as needed for nausea or vomiting. 20 tablet 2  . Prenat w/o A-FeCbn-DSS-FA-DHA (PRENAISSANCE PLUS) 28-1-250 MG CAPS Take 1 capsule by mouth daily.    . promethazine (PHENERGAN) 12.5 MG tablet Take 2 tablets (25 mg total) by mouth every 6 (six) hours as needed for nausea or vomiting. 40 tablet 3   No current facility-administered medications on file prior to visit.     LMP 02/23/2019 (Exact Date)     Observations/Objective:   Gen: Awake, alert, no acute distress Resp: Breathing is even and non-labored Psych: calm/pleasant demeanor Neuro: Alert and Oriented x 3, + facial symmetry, speech is clear.   Assessment and Plan:  Thoracic pain- Will refer to sports medicine for further evaluation. Pain is worsened by her pregnancy but she is really miserable and hopes to be able to work through the next few months of her pregnancy.  ? If she would benefit from osteopathic manipulation or if she would be a candidate for a steroid taper.  Will defer to sports medicine.    Follow Up Instructions:    I discussed the assessment and treatment plan with the patient. The patient was provided an opportunity to ask questions and all were answered. The patient agreed with the plan and demonstrated an understanding of the instructions.   The patient was advised to call back or seek an in-person evaluation if the symptoms worsen or if the condition fails to improve as anticipated.  Nance Pear, NP

## 2019-09-16 MED FILL — PRENAISSANCE PLUS SOFTGEL: 28-1-250 | 30 days supply | Qty: 30 | Fill #4

## 2019-09-20 ENCOUNTER — Other Ambulatory Visit: Payer: Self-pay

## 2019-09-20 ENCOUNTER — Encounter: Payer: Self-pay | Admitting: Physical Therapy

## 2019-09-20 ENCOUNTER — Ambulatory Visit
Payer: No Typology Code available for payment source | Attending: Obstetrics and Gynecology | Admitting: Physical Therapy

## 2019-09-20 DIAGNOSIS — R293 Abnormal posture: Secondary | ICD-10-CM | POA: Insufficient documentation

## 2019-09-20 NOTE — Therapy (Signed)
[place], [place], [address] Phone: [phone]   Fax:  [phone]  Physical Therapy Treatment  Patient Details  Name: Julie Webb MRN: [id number] Date of Birth: [birth year] Referring Provider (PT): Brien Few, MD   Encounter Date: 09/20/2019  PT End of Session - 09/20/19 1156    Visit Number  3    Number of Visits  17    Authorization Type  [facility] FOCUS    PT Start Time  2992    PT Stop Time  1145    PT Time Calculation (min)  42 min    Activity Tolerance  Patient tolerated treatment well    Behavior During Therapy  [facility] for tasks assessed/performed       Past Medical History:  Diagnosis Date  . Anxiety   . Depression   . Glomerulonephritis, poststreptococcal    age 28  . 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 Migraines     History reviewed. No pertinent surgical history.  There were no vitals filed for this visit.  Subjective Assessment - 09/20/19 1108    Subjective  I will be 30 weeks tomorrow  and I went to see Dr Inda Castle and she suggested going to sports medicine with Dr Tamala Julian.  We are just trying to find anything that helps.  Right now my pain level in the AM is 3/10 but in the afternoon I can raise to at least to 8/10 to 10/10    Diagnostic tests  x ray    Patient Stated Goals  Get relief of pain    Currently in Pain?  Yes    Pain Score  3    in AM at Rx time but rises to 8/10 to 10/10   Pain Location  Rib cage    Pain Orientation  Right    Pain Descriptors / Indicators  Sharp    Pain Radiating Towards  spreads from anteirior rib to around my back and spine    Pain Onset  More than a month ago   2 months   Pain Frequency  Intermittent                       [facility] Adult PT Treatment/Exercise - 09/20/19 0001      Posture/Postural Control   Posture Comments  Rt T10 rib depression/Lt elevation; longitudinal rotation Rt pelvis anterior      Exercises   Other Exercises   PRI 90-90 hip lift ith Rt arm reach (feet on table);  Left sidelying  knee toward knee PRI    VC for breathing and rib expansion 4 cyles each     Manual Therapy   Manual Therapy  Soft tissue mobilization    Manual therapy comments  KT tape with 4 vertical I strips and a 100% compression strap over  3 painfulr points painful area horizontally in X formation.  windowpane support not as effective    Soft tissue mobilization  STM between ribs beneath Rt breast               PT Short Term Goals - 09/20/19 1727      PT SHORT TERM GOAL #1   Title  Pt will be able to use small postures and breathing techniques throughout her day to keep pain at a manageable level    Baseline  pt with relief with sidelying PRI exericise    Time  3  Period  Weeks    Status  Partially Met    Target Date  09/30/19        PT Long Term Goals - 09/07/19 1209      PT LONG TERM GOAL #1   Title  Pt will be independent with long term HEP, with modifications in preparation for delivery    Baseline  will progress and establish as appropriate    Time  8    Period  Weeks    Status  New    Target Date  11/04/19      PT LONG TERM GOAL #2   Title  Pt will be able to participate in work shift as needed with pain <=4/10    Baseline  severe at limiting at eval    Time  8    Period  Weeks    Status  New    Target Date  11/04/19      PT LONG TERM GOAL #3   Title  Pt will be able to ambulate for at least 20 min without rest break    Baseline  severe pain and very limited in ambulation    Time  8    Period  Weeks    Status  New    Target Date  11/04/19            Plan - 09/20/19 1157    Clinical Impression Statement  Pt enters clinic with3/10 pain in the morning but it often increases to 8-10/10 pain by evening.  Pt is seeking a second opinion.  she is now [redacted] weeks pregnant and looking for relief of pain in order to get though prenancy.  Pt had some relief with sidelying exericies  and with KT taping to create space with compression X straps.  Will continue POC. STG partially met    Personal Factors and Comorbidities  Comorbidity 1    Comorbidities  [redacted] weeks pregnant    Examination-Activity Limitations  Bathing;Locomotion Level;Transfers;Bed Mobility;Reach Overhead;Bend;Sit;Sleep;Dressing;Stand;Lift    Examination-Participation Restrictions  Meal Prep;Cleaning;Community Activity;Driving    Stability/Clinical Decision Making  Evolving/Moderate complexity    Clinical Decision Making  Moderate    Rehab Potential  Good    PT Frequency  2x / week    PT Duration  8 weeks    PT Treatment/Interventions  ADLs/Self Care Home Management;Cryotherapy;Moist Heat;Functional mobility training;Therapeutic activities;Therapeutic exercise;Patient/family education;Neuromuscular re-education;Manual techniques;Passive range of motion;Taping    PT Next Visit Plan  use pregnancy belt support to decrease pull from ribs    PT Home Exercise Plan  PRI 90-90 hip lift with Rt arm reach LT sidelying PRI exericise    Consulted and Agree with Plan of Care  Patient       Patient will benefit from skilled therapeutic intervention in order to improve the following deficits and impairments:  Impaired UE functional use, Increased muscle spasms, Decreased activity tolerance, Pain, Improper body mechanics, Postural dysfunction  Visit Diagnosis: Abnormal posture     Problem List Patient Active Problem List   Diagnosis Date Noted  . Recurrent major depression in partial remission ([place]) 07/20/2019  . 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 Panic disorder 11/01/2018  . Generalized anxiety disorder 11/01/2018  . Chronic migraine 12/25/2017  . Suicidal behavior with attempted self-injury ([place])   . Tremor 10/05/2015  . Loss of weight 07/06/2015  . Social anxiety disorder 05/30/2015  . H/O acute post-streptococcal glomerulonephritis 05/30/2015  . Contraceptive management 05/30/2015  . Migraine  05/30/2015  Voncille Lo, PT Certified Exercise Expert for the Aging Adult  09/20/19 5:32 PM Phone: [phone] Fax: [place] [facility] [address] [place], [place], [address] Phone: [phone]   Fax:  [phone]  Name: Julie Webb MRN: [id number] Date of Birth: 06-Sep-1991

## 2019-09-22 ENCOUNTER — Ambulatory Visit: Payer: No Typology Code available for payment source | Admitting: Physical Therapy

## 2019-09-27 ENCOUNTER — Ambulatory Visit: Payer: No Typology Code available for payment source | Admitting: Physical Therapy

## 2019-09-27 ENCOUNTER — Other Ambulatory Visit: Payer: Self-pay

## 2019-09-27 ENCOUNTER — Encounter: Payer: Self-pay | Admitting: Physical Therapy

## 2019-09-27 DIAGNOSIS — R293 Abnormal posture: Secondary | ICD-10-CM

## 2019-09-27 NOTE — Therapy (Signed)
Julie Webb, [place], [address] Phone: [phone]   Fax:  [phone]  Physical Therapy Treatment  Patient Details  Name: Julie Webb MRN: [id number] Date of Birth: 05-08-1991 Referring Provider (PT): Julie Few, MD   Encounter Date: 09/27/2019  PT End of Session - 09/27/19 1151    Visit Number  4    Number of Visits  17    Date for PT Re-Evaluation  11/04/19    Authorization Type  [employer] FOCUS    PT Start Time  1150    PT Stop Time  1230    PT Time Calculation (min)  40 min    Activity Tolerance  Patient limited by pain    Behavior During Therapy  [facility] for tasks assessed/performed       Past Medical History:  Diagnosis Date  . Anxiety   . Depression   . 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 Migraines     History reviewed. No pertinent surgical history.  There were no vitals filed for this visit.  Subjective Assessment - 09/27/19 1154    Subjective  I am 9/10 pain and I am in horrible pain and crying at night.  I am at 31 weeks.  the tape helped but I started breaking out later in the evening but I wasnt able to tolerate even 24 hours  I could only handle 12 hours    Patient Stated Goals  Get relief of pain    Currently in Pain?  Yes    Pain Score  9     Pain Location  Rib cage    Pain Orientation  Right    Pain Descriptors / Indicators  Sharp                       [facility] Adult PT Treatment/Exercise - 09/27/19 0001      Self-Care   Self-Care  Other Self-Care Comments    Other Self-Care Comments   education on positioning and use of pillows to support torso/ breathing techniques, Box breathing , use of SI belt for support of abdomen and decrease pull on rib cage.  Pt felt supported with SI belt tightly on.      Exercises   Other Exercises   PRI 90-90 hip lift ith Rt arm reach (feet on table);  Left sidelying  knee  toward knee PRI   doorway stretch with L LE on step in doorway and RT UE holding onto doorframe with RT LE off of step for longitudinal stretch.  which brought relief  combined with Box breathing/ enlarging depressed RT  ribs with approximation of PT concurrently   VC for breathing and rib expansion 10 cyles each     Manual Therapy   Manual Therapy  Soft tissue mobilization    Soft tissue mobilization  STM between ribs/ intercostals beneath Rt breast               PT Short Term Goals - 09/20/19 1727      PT SHORT TERM GOAL #1   Title  Pt will be able to use small postures and breathing techniques throughout her day to keep pain at a manageable level    Baseline  pt with relief with sidelying PRI exericise    Time  3    Period  Weeks    Status  Partially Met  Target Date  09/30/19        PT Long Term Goals - 09/27/19 1242      PT LONG TERM GOAL #1   Title  Pt will be independent with long term HEP, with modifications in preparation for delivery    Baseline  continuing to work on pain management utilizing exericise, stretching and positioning    Time  8    Period  Weeks    Status  On-going    Target Date  11/04/19      PT LONG TERM GOAL #2   Title  Pt will be able to participate in work shift as needed with pain <=4/10    Baseline  9/10 today down to 5/10 after RX but 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   Pt with high rate of pain similar to other non pregnant patients I have treated with stress fx.. [name] is very  compliant and willing to do anything to alleviate pain.  she is unable to complete a work shift at this time due to the pain.  Pt has multiple muscle knots along intercostals anterior ribs 8-11 on RT. Pt has RT T 10 rib depression.   Pt responds well to soft tissue work.  Tried taping whick provided some comfort but pt is skin intolerant to tape. She tried an SI belt in clinic with some relief and would benefit from an order for a Serolla SI belt from medical supply store so she may tolerate the remainder of pregnancy with support keeping ribs from being pulled down. Pt worked on other pain management techniques in clinic today including breathing.  Pt left clinic with 5/10 pain. But relief seems short lived based on previous RX.  Pt was informed of dry needling that can be used but would not be utilized while she is pregnant. If rib pain persists, we will be glad to administer post delivery.  Will continue to pursue Soft tissue work, exericise and stretching/myofascial release to decreas pain for remainder of pregnancy.    Personal Factors and Comorbidities  Comorbidity 1    Comorbidities  [redacted] weeks pregnant    Examination-Activity Limitations  Bathing;Locomotion Level;Transfers;Bed Mobility;Reach Overhead;Bend;Sit;Sleep;Dressing;Stand;Lift    Examination-Participation Restrictions  Meal Prep;Cleaning;Community Activity;Driving       Patient will benefit from skilled therapeutic intervention in order to improve the following deficits and impairments:  Impaired UE functional use, Increased muscle spasms, Decreased activity tolerance, Pain, Improper body mechanics, Postural dysfunction  Visit Diagnosis: Abnormal posture     Problem List Patient Active Problem List   Diagnosis Date Noted  . Recurrent major depression in partial remission ([facility]) 07/20/2019  . 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 Panic disorder 11/01/2018  . Generalized anxiety disorder  11/01/2018  . Chronic migraine  12/25/2017  . Suicidal behavior with attempted self-injury (Julie Webb)   . Tremor 10/05/2015  . Loss of weight 07/06/2015  . Social anxiety disorder 05/30/2015  . H/O acute post-streptococcal glomerulonephritis 05/30/2015  . Contraceptive management 05/30/2015  . Migraine 05/30/2015  Julie Webb, PT Certified Exercise Expert for the Aging Adult  09/27/19 12:59 PM Phone: [phone] Fax: [facility] [address] [place], [place], [address] Phone: [phone]   Fax:  [phone]  Name: Julie Webb MRN: [id number] Date of Birth: [birth year]

## 2019-09-27 NOTE — Patient Instructions (Addendum)
Serolla pregnancy SI belt at [facility].  Try to get an order from your [place] [address] has them  Doorway stretch on step. Stand and hold onto top of doorframe with RT hand and then let RT Leg off of stool.  While you are in stretch position.  Do BOX breathing  Box breathing-  Inhale 4 sec, hold 4 seconds , blow out with pursed lips 4 sec and then hold.  Repeat 5 cycles  In left sidelying , Do PRI make sure your remember to place a pillow under your torso to give your RT side (that is up) extra support and stretch.   Voncille Lo, PT Certified Exercise Expert for the Aging Adult  09/27/19 12:08 PM Phone: [phone] Fax: [phone]

## 2019-09-29 ENCOUNTER — Encounter: Payer: Self-pay | Admitting: Physical Therapy

## 2019-09-29 ENCOUNTER — Ambulatory Visit: Payer: No Typology Code available for payment source | Admitting: Physical Therapy

## 2019-09-29 ENCOUNTER — Other Ambulatory Visit: Payer: Self-pay

## 2019-09-29 DIAGNOSIS — R293 Abnormal posture: Secondary | ICD-10-CM | POA: Diagnosis not present

## 2019-09-29 NOTE — Therapy (Signed)
[place], [place], [address] Phone: [phone]   Fax:  [phone]  Physical Therapy Treatment  Patient Details  Name: Julie Webb MRN: [id number] Date of Birth: 1991/07/14 Referring Provider (PT): Brien Few, MD   Encounter Date: 09/29/2019  PT End of Session - 09/29/19 1308    Visit Number  5    Number of Visits  17    Date for PT Re-Evaluation  11/04/19    Authorization Type  [employer] FOCUS    PT Start Time  1101    PT Stop Time  1141    PT Time Calculation (min)  40 min    Activity Tolerance  Patient tolerated treatment well    Behavior During Therapy  [facility] for tasks assessed/performed       Past Medical History:  Diagnosis Date  . Anxiety   . Depression   . Glomerulonephritis, poststreptococcal    age 22  . 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 Migraines     History reviewed. No pertinent surgical history.  There were no vitals filed for this visit.  Subjective Assessment - 09/29/19 1104    Subjective  The door jam stretch is awesome. No long term effects.    Currently in Pain?  Yes    Pain Score  7                        [facility] Adult PT Treatment/Exercise - 09/29/19 0001      Exercises   Other Exercises   door hinge stretch altered to pull from FM; seated breathing 1/2 foam roll pressing Rt ribs      Manual Therapy   Manual Therapy  Joint mobilization    Joint Mobilization  rib mobs with breathing in sidelying    Soft tissue mobilization  between ribs on Rt in Lt sidelying               PT Short Term Goals - 09/20/19 1727      PT SHORT TERM GOAL #1   Title  Pt will be able to use small postures and breathing techniques throughout her day to keep pain at a manageable level    Baseline  pt with relief with sidelying PRI exericise    Time  3    Period  Weeks    Status  Partially Met    Target Date  09/30/19        PT Long Term Goals -  09/27/19 1242      PT LONG TERM GOAL #1   Title  Pt will be independent with long term HEP, with modifications in preparation for delivery    Baseline  continuing to work on pain management utilizing exericise, stretching and positioning    Time  8    Period  Weeks    Status  On-going    Target Date  11/04/19      PT LONG TERM GOAL #2   Title  Pt will be able to participate in work shift as needed with pain <=4/10    Baseline  9/10 today down to 5/10 after RX but 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 Added pull off to door stretch. Will bring husband to appointment for education on assistance. Sat with half foam roll behind her providing pressure to ribs. Tightness in stomach area after stretching, pt was monitored until tightness reduced.    PT Treatment/Interventions  ADLs/Self Care Home Management;Cryotherapy;Moist Heat;Functional mobility training;Therapeutic activities;Therapeutic exercise;Patient/family education;Neuromuscular re-education;Manual techniques;Passive range of motion;Taping    PT Next Visit Plan  educate husband on assist for STM    PT Home Exercise Plan  PRI 90-90 hip lift with Rt arm reach LT sidelying PRI exericise, door way stretch with RUE on top of door frame    Consulted and Agree with Plan of Care  Patient       Patient will benefit from skilled therapeutic intervention in order to improve the following deficits and impairments:  Impaired UE functional use, Increased muscle spasms,  Decreased activity tolerance, Pain, Improper body mechanics, Postural dysfunction  Visit Diagnosis: Abnormal posture     Problem List Patient Active Problem List   Diagnosis Date Noted  . Recurrent major depression in partial remission ([place]) 07/20/2019  . 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 Panic disorder 11/01/2018  . Generalized anxiety disorder 11/01/2018  . Chronic migraine 12/25/2017  . Suicidal behavior with attempted self-injury ([place])   . Tremor 10/05/2015  . Loss of weight 07/06/2015  . Social anxiety disorder 05/30/2015  . H/O acute post-streptococcal glomerulonephritis 05/30/2015  . Contraceptive management 05/30/2015  . Migraine 05/30/2015    Desi Rowe C. Nachum Derossett PT, DPT 09/29/19 1:14 PM   [facility] [address] [place], [place], [address] Phone: [phone]   Fax:  [phone]  Name: Julie Webb MRN: [id number] Date of Birth: 1991/01/29

## 2019-10-04 ENCOUNTER — Other Ambulatory Visit: Payer: Self-pay

## 2019-10-04 ENCOUNTER — Ambulatory Visit: Payer: No Typology Code available for payment source | Admitting: Physical Therapy

## 2019-10-04 ENCOUNTER — Encounter: Payer: Self-pay | Admitting: Physical Therapy

## 2019-10-04 DIAGNOSIS — R293 Abnormal posture: Secondary | ICD-10-CM | POA: Diagnosis not present

## 2019-10-04 NOTE — Therapy (Signed)
[place], [place], [address] Phone: [phone]   Fax:  [phone]  Physical Therapy Treatment  Patient Details  Name: Julie Webb MRN: [id number] Date of Birth: 1991-05-08 Referring Provider (PT): Julie Few, MD   Encounter Date: 10/04/2019  PT End of Session - 10/04/19 1532    Visit Number  6    Number of Visits  17    Date for PT Re-Evaluation  11/04/19    Authorization Type  [facility] FOCUS    PT Start Time  1150    PT Stop Time  1230    PT Time Calculation (min)  40 min    Activity Tolerance  Patient tolerated treatment well    Behavior During Therapy  [facility] for tasks assessed/performed       Past Medical History:  Diagnosis Date  . Anxiety   . Depression   . Glomerulonephritis, poststreptococcal    age 28  . 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 Migraines     History reviewed. No pertinent surgical history.  There were no vitals filed for this visit.  Subjective Assessment - 10/04/19 1157    Subjective  Breathing exericise are helping. I am a 5/10 today.  We got a release with Julie Webb but it was also our baby shower day and I hurt a lot that night .  dont know if it was the extra activity and  I am continuing to work. I feel like the massage helps a lot.    Patient is accompained by:  Family member   husband   Diagnostic tests  x ray    Patient Stated Goals  Get relief of pain    Pain Score  5     Pain Location  Rib cage    Pain Orientation  Right    Pain Descriptors / Indicators  Sharp;Shooting    Pain Onset  More than a month ago    Pain Frequency  Intermittent                       [facility] Adult PT Treatment/Exercise - 10/04/19 0001      Self-Care   Self-Care  Other Self-Care Comments    Other Self-Care Comments   educated husband with myofascial/massage techniques for  wife for pain management and pain reduction, use of skeleton for education and  return demonstration on wife      Therapeutic Activites    Therapeutic Activities  Other Therapeutic Activities    Other Therapeutic Activities  Husband guided for correct execution of massage form spine to sternum intercostal mx.  Pt husband also educated and retrun Psychologist, prison and probation services. Husband also educated using skeleton as well for better visual to reninforce technique      Manual Therapy   Manual Therapy  Joint mobilization;Myofascial release    Joint Mobilization  rib mobs with breathing in sidelying    Soft tissue mobilization  between ribs on Rt in Lt sidelying    Myofascial Release  use of myofascial technique between ribs              PT Education - 10/04/19 1150    Education Details  educated husband on massage, myofascial technique for wife as well as positioning with pillows.  utilized skeleton for education and hands on technique    Person(s) Educated  Patient;Spouse    Methods  Explanation;Demonstration;Tactile cues;Verbal cues    Comprehension  Verbalized understanding;Returned  demonstration       PT Short Term Goals - 09/20/19 1727      PT SHORT TERM GOAL #1   Title  Pt will be able to use small postures and breathing techniques throughout her day to keep pain at a manageable level    Baseline  pt with relief with sidelying PRI exericise    Time  3    Period  Weeks    Status  Partially Met    Target Date  09/30/19        PT Long Term Goals - 09/27/19 1242      PT LONG TERM GOAL #1   Title  Pt will be independent with long term HEP, with modifications in preparation for delivery    Baseline  continuing to work on pain management utilizing exericise, stretching and positioning    Time  8    Period  Weeks    Status  On-going    Target Date  11/04/19      PT LONG TERM GOAL #2   Title  Pt will be able to participate in work shift as needed with pain <=4/10    Baseline  9/10 today down to 5/10 after RX but 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 Pt stated that last session was helpful but she did attend a baby shower and was on her feet extra that night and had unbearable pain.  Pt husband , Julie Webb, was eager to help and attentive to wife and needs. PT continued with rib mobs as done last session.  Pt husband  was very conscientious and attentive and sensitive to wifes verbal input.  Will continue POC.    Personal Factors and Comorbidities  Comorbidity 1    Comorbidities  [redacted] weeks pregnant    Examination-Activity Limitations  Bathing;Locomotion Level;Transfers;Bed Mobility;Reach Overhead;Bend;Sit;Sleep;Dressing;Stand;Lift    Examination-Participation Restrictions  Meal Prep;Cleaning;Community Activity;Driving    Stability/Clinical Decision Making  Evolving/Moderate complexity    Clinical Decision Making  Moderate    Rehab Potential  Good    PT Frequency  2x / week    PT Duration  8 weeks    PT Treatment/Interventions  ADLs/Self Care Home Management;Cryotherapy;Moist Heat;Functional mobility training;Therapeutic activities;Therapeutic exercise;Patient/family education;Neuromuscular re-education;Manual techniques;Passive range of motion;Taping    PT Next Visit Plan  continue pain management strategies    PT Home Exercise Plan  PRI 90-90 hip lift with Rt arm reach LT sidelying PRI exericise, door way stretch with RUE on top of door frame    Consulted and Agree with Plan of Care  Patient       Patient will benefit  from skilled therapeutic intervention in order  to improve the following deficits and impairments:  Impaired UE functional use, Increased muscle spasms, Decreased activity tolerance, Pain, Improper body mechanics, Postural dysfunction  Visit Diagnosis: Abnormal posture     Problem List Patient Active Problem List   Diagnosis Date Noted  . Recurrent major depression in partial remission ([place]) 07/20/2019  . 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 Panic disorder 11/01/2018  . Generalized anxiety disorder 11/01/2018  . Chronic migraine 12/25/2017  . Suicidal behavior with attempted self-injury ([place])   . Tremor 10/05/2015  . Loss of weight 07/06/2015  . Social anxiety disorder 05/30/2015  . H/O acute post-streptococcal glomerulonephritis 05/30/2015  . Contraceptive management 05/30/2015  . Migraine 05/30/2015   Voncille Lo, PT Certified Exercise Expert for the Aging Adult  10/04/19 3:46 PM Phone: [phone] Fax: [facility] [address] [place], [place], [address] Phone: [phone]   Fax:  [phone]  Name: Julie Webb MRN: [id number] Date of Birth: 09/02/1991

## 2019-10-06 ENCOUNTER — Ambulatory Visit: Payer: No Typology Code available for payment source | Admitting: Physical Therapy

## 2019-10-06 ENCOUNTER — Encounter: Payer: Self-pay | Admitting: Physical Therapy

## 2019-10-06 ENCOUNTER — Other Ambulatory Visit: Payer: Self-pay

## 2019-10-06 DIAGNOSIS — R293 Abnormal posture: Secondary | ICD-10-CM

## 2019-10-06 NOTE — Therapy (Signed)
[place], [place], [address] Phone: [phone]   Fax:  [phone]  Physical Therapy Treatment  Patient Details  Name: DALISSA LOVIN MRN: [id number] Date of Birth: 07/13/1991 Referring Provider (PT): Brien Few, MD   Encounter Date: 10/06/2019  PT End of Session - 10/06/19 1114    Visit Number  7    Number of Visits  17    Date for PT Re-Evaluation  11/04/19    Authorization Type  [facility] FOCUS    PT Start Time  1114   pt arrived late   PT Stop Time  1147    PT Time Calculation (min)  33 min    Activity Tolerance  Patient tolerated treatment well    Behavior During Therapy  [facility] for tasks assessed/performed       Past Medical History:  Diagnosis Date  . Anxiety   . Depression   . Glomerulonephritis, poststreptococcal    age 28  . 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 Migraines     History reviewed. No pertinent surgical history.  There were no vitals filed for this visit.  Subjective Assessment - 10/06/19 1115    Subjective  Extra pain today, had a lot of nausea & dry heaving this morning    Currently in Pain?  Yes    Pain Score  8     Pain Location  Rib cage    Pain Orientation  Right    Pain Descriptors / Indicators  Spasm;Sharp    Aggravating Factors   pain progresses over the day    Pain Relieving Factors  STM         [facility] PT Assessment - 10/06/19 0001      Palpation   Palpation comment  IR of Rt lower ribs  with improved mobility                   [facility] Adult PT Treatment/Exercise - 10/06/19 0001      Modalities   Modalities  Moist Heat      Moist Heat Therapy   Number Minutes Moist Heat  10 Minutes   monitored during & after heat    Moist Heat Location  --   Rt ribs     Manual Therapy   Joint Mobilization  rib mobs with breathing in sidelying    Soft tissue mobilization  intercostals along length of ribs, thoraco lumbar paraspinals                PT Short Term Goals - 09/20/19 1727      PT SHORT TERM GOAL #1   Title  Pt will be able to use small postures and breathing techniques throughout her day to keep pain at a manageable level    Baseline  pt with relief with sidelying PRI exericise    Time  3    Period  Weeks    Status  Partially Met    Target Date  09/30/19        PT Long Term Goals - 09/27/19 1242      PT LONG TERM GOAL #1   Title  Pt will be independent with long term HEP, with modifications in preparation for delivery    Baseline  continuing to work on pain management utilizing exericise, stretching and positioning    Time  8    Period  Weeks    Status  On-going    Target Date  11/04/19  PT LONG TERM GOAL #2   Title  Pt will be able to participate in work shift as needed with pain <=4/10    Baseline  9/10 today down to 5/10 after RX but 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 Improved mobility in thoraco-costal joints with quicker release of ribs with holds. Heat placed along ribs following- small heat pad above belly- to reduce spasm.    PT Treatment/Interventions  ADLs/Self Care Home Management;Cryotherapy;Moist Heat;Functional mobility training;Therapeutic activities;Therapeutic exercise;Patient/family education;Neuromuscular re-education;Manual techniques;Passive range of motion;Taping    PT Next Visit Plan  continue pain management strategies    PT Home Exercise Plan  PRI 90-90 hip lift  with Rt arm reach LT sidelying PRI exericise, door way stretch with RUE on top of door frame    Consulted and Agree with Plan of Care  Patient       Patient will benefit from skilled therapeutic intervention in order to improve the following deficits and impairments:  Impaired UE functional use, Increased muscle spasms, Decreased activity tolerance, Pain, Improper body mechanics, Postural dysfunction  Visit Diagnosis: Abnormal posture     Problem List Patient Active Problem List   Diagnosis Date Noted  . Recurrent major depression in partial remission ([place]) 07/20/2019  . 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 Panic disorder 11/01/2018  . Generalized anxiety disorder 11/01/2018  . Chronic migraine 12/25/2017  . Suicidal behavior with attempted self-injury ([place])   . Tremor 10/05/2015  . Loss of weight 07/06/2015  . Social anxiety disorder 05/30/2015  . H/O acute post-streptococcal glomerulonephritis 05/30/2015  . Contraceptive management 05/30/2015  . Migraine 05/30/2015    Lavada Langsam C. Mercadies Co PT, DPT 10/06/19 12:50 PM   [place] [facility] [address] [place], [place], [address] Phone: [phone]   Fax:  [phone]  Name: MELEENA MUNROE MRN: [id number] Date of Birth: [birth year]

## 2019-10-07 ENCOUNTER — Ambulatory Visit (INDEPENDENT_AMBULATORY_CARE_PROVIDER_SITE_OTHER): Payer: No Typology Code available for payment source | Admitting: Family Medicine

## 2019-10-07 DIAGNOSIS — R0781 Pleurodynia: Secondary | ICD-10-CM

## 2019-10-07 DIAGNOSIS — M999 Biomechanical lesion, unspecified: Secondary | ICD-10-CM | POA: Diagnosis not present

## 2019-10-07 NOTE — Assessment & Plan Note (Addendum)
Knee pain on the right side.  No significant improvement may have also had a atypical presentation.  Discussed with patient in great length regimen, home exercises, but at this moment with her pregnant monitor very closely.  Continues very mild soft tissue mobilization discussed over-the-counter natural topical medication that could be potentially beneficial.  Discussed the possibility of reflux contributing to some of the discomfort and pain as well.  Patients will consider taking some Tums on a more regular basis.  Follow-up again 3 weeks discussed with patient about potential laboratory work-up as well as abdominal ultrasound to rule out gallbladder disease but does not appear to be there is with it being mostly secondary to movement and musculoskeletal.

## 2019-10-07 NOTE — Progress Notes (Signed)
[facility] [address] [place], [place] [address] Phone: [phone] Subjective:   Julie Webb, am serving as a scribe for Dr. Hulan Saas. This visit occurred during the SARS-CoV-2 public health emergency.  Safety protocols were in place, including screening questions prior to the visit, additional usage of staff PPE, and extensive cleaning of exam room while observing appropriate contact time as indicated for disinfecting solutions.   I'm seeing this patient by the request  of:  Debbrah Alar, NP   CC: Right-sided rib pain  [id number]  Julie Webb is a 28 y.o. female coming in with complaint of right rib pain. 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 Patient is pregnant. Patient is doing PT for the pain that runs from the sternum to the spine. Pain is burning, tingling and sharp pain. Is hard for patient to take deep breaths and pain increases with activity. Patient is [redacted] weeks pregnant.  Normal pregnancy so far     Past Medical History:  Diagnosis Date  . Anxiety   . Depression   . Glomerulonephritis, poststreptococcal    age 21  . 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 Migraines    Webb past surgical history on file. Social History   Socioeconomic History  . Marital status: Married    Spouse name: Not on file  . Number of children: Not on file  . Years of education: Not on file  . Highest education level: Not on file  Occupational History  . Not on file  Tobacco Use  . Smoking status: Never Smoker  . Smokeless tobacco: Never Used  Substance and Sexual Activity  . Alcohol use: Yes    Comment: occasionally- once a month if then pt reports  . Drug use: Webb  . Sexual activity: Yes    Birth control/protection: Condom, None  Other Topics Concern  . 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 Difficulty of Paying Living Expenses: Not on file  Food Insecurity:   . Worried About Charity fundraiser in the Last Year: Not on file  . Ran Out of Food in the Last Year: Not on file  Transportation Needs:   . Lack of Transportation (Medical): Not on file  . Lack of Transportation (Non-Medical): Not on file  Physical Activity:   . Days of Exercise per Week: Not on file  . Minutes of Exercise per Session: Not on file  Stress:   . Feeling of Stress : Not on file  Social Connections:   . Frequency of Communication with Friends and Family: Not on file  . Frequency of Social Gatherings with Friends and Family: Not on file  . Attends Religious Services: Not on file  . Active Member of Clubs or Organizations: Not on file  . Attends Archivist Meetings: Not on file  . Marital Status: Not on file   Allergies  Allergen Reactions  . Ibuprofen Other (See Comments)    Kidney failure  . Phenergan [Promethazine Hcl] Other (See Comments)    Restlessness w/ IV Phenergen. Probable mild EPS. Tolerates if taken w/ Benadryl.  Family History  Problem Relation Age of Onset  . Hyperlipidemia Mother   . Mental illness Mother   . Fibroids Mother        uterine  . Hypertension Father   . Heart attack Father   . Heart disease Maternal Grandmother   . Hyperlipidemia Maternal Grandmother   . Hypertension Maternal Grandmother   . Heart disease Maternal Grandfather   . Hyperlipidemia Maternal Grandfather   . Hypertension Maternal Grandfather   . Pancreatic cancer Paternal Grandmother   . Mesothelioma Paternal Grandfather       Current Outpatient Medications (Respiratory):  .  promethazine (PHENERGAN) 12.5 MG tablet, Take 2 tablets (25 mg total) by mouth every 6 (six) hours as needed for nausea or vomiting.  Current Outpatient Medications  (Analgesics):  .  butalbital-acetaminophen-caffeine (FIORICET, ESGIC) 50-325-40 MG tablet, Take 1 tablet by mouth every 6 (six) hours as needed for headache.   Current Outpatient Medications (Other):  Marland Kitchen  DULoxetine (CYMBALTA) 60 MG capsule, Take 1 capsule (60 mg total) by mouth at bedtime. .  ondansetron (ZOFRAN ODT) 8 MG disintegrating tablet, Take 1 tablet (8 mg total) by mouth every 8 (eight) hours as needed for nausea or vomiting. .  Prenat w/o A-FeCbn-DSS-FA-DHA (PRENAISSANCE PLUS) 28-1-250 MG CAPS, Take 1 capsule by mouth daily.    Past medical history, social, surgical and family history all reviewed in electronic medical record.  Webb pertanent information unless stated regarding to the chief complaint.   Review of Systems:  Webb headache, visual changes, nausea, vomiting, diarrhea, constipation, dizziness, abdominal pain, skin rash, fevers, chills, night sweats, weight loss, swollen lymph nodes, body aches, joint swelling, muscle aches, chest pain, shortness of breath, mood changes.   Objective  Blood pressure 124/78, pulse (!) 107, height 5\' 4"  (1.626 m), weight 146 lb (66.2 kg), last menstrual period 02/23/2019, SpO2 97 %.    General: Webb apparent distress alert and oriented x3 mood and affect normal, dressed appropriately.  HEENT: Pupils equal, extraocular movements intact  Respiratory: Patient's speak in full sentences and does not appear short of breath  Cardiovascular: Webb lower extremity edema, non tender, Webb erythema  Skin: Warm dry intact with Webb signs of infection or rash on extremities or on axial skeleton.  Abdomen: Soft and gravid mild tenderness in the right upper quadrant region. Neuro: Cranial nerves II through XII are intact, neurovascularly intact in all extremities with 2+ DTRs and 2+ pulses.  Lymph: Webb lymphadenopathy of posterior or anterior cervical chain or axillae bilaterally.  Gait normal with good balance and coordination.  MSK:  Non tender with full range of  motion and good stability and symmetric strength and tone of shoulders, elbows, wrist, hip, knee and ankles bilaterally.   Back exam does have some mild tightness in the thoracolumbar juncture but good range of motion.  Osteopathic findings T9 extended rotated and side bent right with inhaled rib   Impression and Recommendations:     This case required medical decision making of moderate complexity. The above documentation has been reviewed and is accurate and complete Lyndal Pulley, DO       Note: This dictation was prepared with Dragon dictation along with smaller phrase technology. Any transcriptional errors that result from this process are unintentional.

## 2019-10-07 NOTE — Patient Instructions (Addendum)
  [address], [address] [place], [place] [address] Phone [phone]  Vitamin D 2000IU daily Tums once a day at least Arnica lotion See me again in 4 weeks

## 2019-10-08 ENCOUNTER — Encounter: Payer: Self-pay | Admitting: Family Medicine

## 2019-10-08 DIAGNOSIS — M999 Biomechanical lesion, unspecified: Secondary | ICD-10-CM | POA: Insufficient documentation

## 2019-10-08 NOTE — Assessment & Plan Note (Signed)
Decision today to treat with OMT was based on Physical Exam  After verbal consent patient was treated with  ME,techniques in rib l areas  Patient tolerated the procedure well with improvement in symptoms  Patient given exercises, stretches and lifestyle modifications  See medications in patient instructions if given  Patient will follow up in 3 weeks

## 2019-10-11 ENCOUNTER — Ambulatory Visit: Payer: No Typology Code available for payment source | Admitting: Physical Therapy

## 2019-10-11 ENCOUNTER — Other Ambulatory Visit: Payer: Self-pay

## 2019-10-11 ENCOUNTER — Encounter: Payer: Self-pay | Admitting: Physical Therapy

## 2019-10-11 DIAGNOSIS — R293 Abnormal posture: Secondary | ICD-10-CM | POA: Diagnosis not present

## 2019-10-11 NOTE — Therapy (Signed)
[place], [place], [address] Phone: [phone]   Fax:  [phone]  Physical Therapy Treatment  Patient Details  Name: Julie Webb MRN: [id number] Date of Birth: 1991/03/05 Referring Provider (PT): Brien Few, MD   Encounter Date: 10/11/2019  PT End of Session - 10/11/19 1110    Visit Number  8    Number of Visits  17    Date for PT Re-Evaluation  11/04/19    Authorization Type  [facility] FOCUS    PT Start Time  1104    PT Stop Time  1144    PT Time Calculation (min)  40 min    Activity Tolerance  Patient tolerated treatment well    Behavior During Therapy  [facility] for tasks assessed/performed;Anxious       Past Medical History:  Diagnosis Date  . Anxiety   . Depression   . Glomerulonephritis, poststreptococcal    age 29  . 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 Migraines     History reviewed. No pertinent surgical history.  There were no vitals filed for this visit.  Subjective Assessment - 10/11/19 1106    Subjective  Was offered nerve blocks at 36 weeks. Having a lot of pain today. Under a lot of stress.    Patient Stated Goals  Get relief of pain                       [facility] Adult PT Treatment/Exercise - 10/11/19 0001      Manual Therapy   Manual Therapy  Taping    Joint Mobilization  Rt rib mobs from Lateral, post and anterior- pressure with breathing    Soft tissue mobilization  intercostals    McConnell  X over ant Lt 3rd sternocostal joint             PT Education - 10/11/19 1245    Education Details  self sternocostal mobs    Person(s) Educated  Patient    Methods  Explanation;Demonstration;Tactile cues;Verbal cues    Comprehension  Returned demonstration;Verbalized understanding;Verbal cues required;Tactile cues required;Need further instruction       PT Short Term Goals - 10/11/19 1242      PT SHORT TERM GOAL #1   Title  Pt will be able to use  small postures and breathing techniques throughout her day to keep pain at a manageable level    Baseline  pt with relief with sidelying PRI exericise, improved breathing with self rib depression without long term effects    Status  Partially Met        PT Long Term Goals - 09/27/19 1242      PT LONG TERM GOAL #1   Title  Pt will be independent with long term HEP, with modifications in preparation for delivery    Baseline  continuing to work on pain management utilizing exericise, stretching and positioning    Time  8    Period  Weeks    Status  On-going    Target Date  11/04/19      PT LONG TERM GOAL #2   Title  Pt will be able to participate in work shift as needed with pain <=4/10    Baseline  9/10 today down to 5/10 after RX but 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 Localized pain at post T3 costovertebral joint with prominent sternocostal joint. Reduced sternocostal joint with mobs with breathing and pt reported she was able to breathe better and laugh without increased pain. Placed X mcconnell tape at sternocostal joint and taught pt self mobs with breathing. suspect that breasts filling with colostrum is placing excess pressure on ribs below breast tissue and is creating more muscle tension in that area. High stress over fear of losing job and we discussed the importance of decreasing stress as it can feed pain.    PT  Treatment/Interventions  ADLs/Self Care Home Management;Cryotherapy;Moist Heat;Functional mobility training;Therapeutic activities;Therapeutic exercise;Patient/family education;Neuromuscular re-education;Manual techniques;Passive range of motion;Taping    PT Next Visit Plan  outcome of tape?    PT Home Exercise Plan  PRI 90-90 hip lift with Rt arm reach LT sidelying PRI exericise, door way stretch with RUE on top of door frame    Consulted and Agree with Plan of Care  Patient       Patient will benefit from skilled therapeutic intervention in order to improve the following deficits and impairments:  Impaired UE functional use, Increased muscle spasms, Decreased activity tolerance, Pain, Improper body mechanics, Postural dysfunction  Visit Diagnosis: Abnormal posture     Problem List Patient Active Problem List   Diagnosis Date Noted  . Nonallopathic lesion of rib cage 10/08/2019  . Rib pain on right side 10/07/2019  . 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 Panic disorder 11/01/2018  . Generalized anxiety disorder 11/01/2018  . Chronic migraine 12/25/2017  . Suicidal behavior with attempted self-injury ([place])   . Tremor 10/05/2015  . Loss of weight 07/06/2015  . Social anxiety disorder 05/30/2015  . H/O acute post-streptococcal glomerulonephritis 05/30/2015  . Contraceptive management 05/30/2015  . Migraine 05/30/2015    Kerby Hockley C. Elonzo Sopp PT, DPT 10/11/19 12:46 PM   [facility] [facility] [address] [place], [place], [address] Phone: [phone]   Fax:  [phone]  Name: Julie Webb MRN: [id number] Date of Birth: 1991/04/26

## 2019-10-13 ENCOUNTER — Ambulatory Visit: Payer: No Typology Code available for payment source | Admitting: Physical Therapy

## 2019-10-18 ENCOUNTER — Ambulatory Visit: Payer: No Typology Code available for payment source | Admitting: Physical Therapy

## 2019-10-18 ENCOUNTER — Encounter: Payer: Self-pay | Admitting: Physical Therapy

## 2019-10-18 ENCOUNTER — Other Ambulatory Visit: Payer: Self-pay

## 2019-10-18 DIAGNOSIS — R293 Abnormal posture: Secondary | ICD-10-CM

## 2019-10-18 MED FILL — PRENAISSANCE PLUS SOFTGEL: 28-1-250 | 30 days supply | Qty: 30 | Fill #5

## 2019-10-18 NOTE — Therapy (Signed)
[place], [place], [address] Phone: [phone]   Fax:  [phone]  Physical Therapy Treatment  Patient Details  Name: Julie Webb MRN: [id number] Date of Birth: 1991/05/20 Referring Provider (PT): Brien Few, MD   Encounter Date: 10/18/2019  PT End of Session - 10/18/19 1027    Visit Number  9    Number of Visits  17    Date for PT Re-Evaluation  11/04/19    Authorization Type  [facility] FOCUS    PT Start Time  1026    PT Stop Time  1100    PT Time Calculation (min)  34 min    Activity Tolerance  Patient tolerated treatment well    Behavior During Therapy  [facility] for tasks assessed/performed;Anxious       Past Medical History:  Diagnosis Date  . Anxiety   . Depression   . Glomerulonephritis, poststreptococcal    age 28  . 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 Migraines     History reviewed. No pertinent surgical history.  There were no vitals filed for this visit.  Subjective Assessment - 10/18/19 1027    Subjective  I had to cancel last time due to nausea and feeling ill. The tape helped my rib but it got nasty and I had to remove a couple of days.    Patient is accompained by:  Family member    Diagnostic tests  x ray    Patient Stated Goals  Get relief of pain    Currently in Pain?  Yes    Pain Score  3     Pain Location  Rib cage    Pain Orientation  Right    Pain Descriptors / Indicators  Spasm;Sharp    Pain Onset  More than a month ago    Pain Frequency  Intermittent                       [facility] Adult PT Treatment/Exercise - 10/18/19 0001      Manual Therapy   Manual Therapy  Taping    Joint Mobilization  Rt rib mobs from Lateral, post and anterior- pressure with breathing    Soft tissue mobilization  intercostals    McConnell  star pattern over ant Lt 3rd sternocostal joint over pre wrap tape               PT Short Term Goals - 10/11/19 1242       PT SHORT TERM GOAL #1   Title  Pt will be able to use small postures and breathing techniques throughout her day to keep pain at a manageable level    Baseline  pt with relief with sidelying PRI exericise, improved breathing with self rib depression without long term effects    Status  Partially Met        PT Long Term Goals - 09/27/19 1242      PT LONG TERM GOAL #1   Title  Pt will be independent with long term HEP, with modifications in preparation for delivery    Baseline  continuing to work on pain management utilizing exericise, stretching and positioning    Time  8    Period  Weeks    Status  On-going    Target Date  11/04/19      PT LONG TERM GOAL #2   Title  Pt will be able to participate in work shift as  needed with pain <=4/10    Baseline  9/10 today down to 5/10 after RX but 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 Localized pain still at T-3 costovertebral joint but taping seems to have greatly reduced pain. Continuing with deep breathing with STM/trigger point massage.  Pt is concerned about delivery and possibly causing stress fx of joints.  Pt may have hypermobility issues with greater translation and movment in joints.  Will continue POC.    Personal Factors and Comorbidities  Comorbidity 1    Comorbidities  [redacted] weeks pregnant    Examination-Activity Limitations  Bathing;Locomotion Level;Transfers;Bed Mobility;Reach Overhead;Bend;Sit;Sleep;Dressing;Stand;Lift     Examination-Participation Restrictions  Meal Prep;Cleaning;Community Activity;Driving    Stability/Clinical Decision Making  Evolving/Moderate complexity    Clinical Decision Making  Moderate    Rehab Potential  Good    PT Frequency  2x / week    PT Treatment/Interventions  ADLs/Self Care Home Management;Cryotherapy;Moist Heat;Functional mobility training;Therapeutic activities;Therapeutic exercise;Patient/family education;Neuromuscular re-education;Manual techniques;Passive range of motion;Taping    PT Next Visit Plan  tape is good  will continue mc connell taping    PT Home Exercise Plan  PRI 90-90 hip lift with Rt arm reach LT sidelying PRI exericise, door way stretch with RUE on top of door frame    Consulted and Agree with Plan of Care  Patient       Patient will benefit from skilled therapeutic intervention in order to improve the following deficits and impairments:  Impaired UE functional use, Increased muscle spasms, Decreased activity tolerance, Pain, Improper body mechanics, Postural dysfunction  Visit Diagnosis: Abnormal posture     Problem List Patient Active Problem List   Diagnosis Date Noted  . Nonallopathic lesion of rib cage 10/08/2019  . Rib pain on right side 10/07/2019  . 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 Panic disorder 11/01/2018  . Generalized anxiety disorder 11/01/2018  . Chronic migraine 12/25/2017  . Suicidal behavior with attempted self-injury ([place])   . Tremor 10/05/2015  . Loss of weight 07/06/2015  . Social anxiety disorder 05/30/2015  . H/O acute post-streptococcal glomerulonephritis 05/30/2015  . Contraceptive management 05/30/2015  . Migraine 05/30/2015   Voncille Lo, PT Certified Exercise Expert for the Aging Adult  10/18/19 2:39 PM Phone: [phone] Fax: [place] [facility] [address] [place], [place], [address] Phone: [phone]   Fax:  [phone]  Name: Julie Webb MRN: [id number] Date of Birth: 11-25-1990

## 2019-10-21 NOTE — L&D Delivery Note (Signed)
Delivery Note At 8:00 PM a viable and healthy female was delivered via Vaginal, Spontaneous (Presentation: Middle Occiput Anterior).  APGAR: 9, 9; weight  .   Placenta status: Spontaneous, Intact.  Cord: 3 vessels with the following complications: None.  Cord pH: na  Anesthesia: Epidural Episiotomy: None Lacerations: 2nd degree;Vaginal Suture Repair: 2.0 vicryl rapide Est. Blood Loss (mL): 297  Mom to postpartum.  Baby to Couplet care / Skin to Skin.  Aminah Zabawa J 11/23/2019, 8:56 PM

## 2019-10-25 ENCOUNTER — Ambulatory Visit: Payer: No Typology Code available for payment source | Admitting: Physical Therapy

## 2019-10-27 ENCOUNTER — Ambulatory Visit
Payer: No Typology Code available for payment source | Attending: Obstetrics and Gynecology | Admitting: Physical Therapy

## 2019-10-27 ENCOUNTER — Other Ambulatory Visit: Payer: Self-pay

## 2019-10-27 ENCOUNTER — Encounter: Payer: Self-pay | Admitting: Physical Therapy

## 2019-10-27 DIAGNOSIS — R293 Abnormal posture: Secondary | ICD-10-CM | POA: Diagnosis not present

## 2019-10-27 DIAGNOSIS — M25611 Stiffness of right shoulder, not elsewhere classified: Secondary | ICD-10-CM | POA: Insufficient documentation

## 2019-10-27 NOTE — Patient Instructions (Addendum)
Trigger Point Dry Needling  . What is Trigger Point Dry Needling (DN)? o DN is a physical therapy technique used to treat muscle pain and dysfunction. Specifically, DN helps deactivate muscle trigger points (muscle knots).  o A thin filiform needle is used to penetrate the skin and stimulate the underlying trigger point. The goal is for a local twitch response (LTR) to occur and for the trigger point to relax. No medication of any kind is injected during the procedure.   . What Does Trigger Point Dry Needling Feel Like?  o The procedure feels different for each individual patient. Some patients report that they do not actually feel the needle enter the skin and overall the process is not painful. Very mild bleeding may occur. However, many patients feel a deep cramping in the muscle in which the needle was inserted. This is the local twitch response.   Marland Kitchen How Will I feel after the treatment? o Soreness is normal, and the onset of soreness may not occur for a few hours. Typically this soreness does not last longer than two days.  o Bruising is uncommon, however; ice can be used to decrease any possible bruising.  o In rare cases feeling tired or nauseous after the treatment is normal. In addition, your symptoms may get worse before they get better, this period will typically not last longer than 24 hours.   . What Can I do After My Treatment? o Increase your hydration by drinking more water for the next 24 hours. o You may place ice or heat on the areas treated that have become sore, however, do not use heat on inflamed or bruised areas. Heat often brings more relief post needling. o You can continue your regular activities, but vigorous activity is not recommended initially after the treatment for 24 hours. o DN is best combined with other physical therapy such as strengthening, stretching, and other therapies.   Access Code: [email or web address]  URL: [email or web address]  Date: 10/27/2019   Prepared by: Voncille Lo   Exercises  Serratus Activation at [facility] with Foam Roll and Resistance Band - 10 reps - 3 sets - 1x daily - 7x weekly  Pt used pillow case to push arms out in for resistance  Voncille Lo, PT Certified Exercise Expert for the Aging Adult  10/27/19 11:24 AM Phone: [phone] Fax: [phone]

## 2019-10-27 NOTE — Therapy (Signed)
[place], [place], [address] Phone: [phone]   Fax:  [phone]  Physical Therapy Treatment  Patient Details  Name: Julie Webb MRN: [id number] Date of Birth: Aug 02, 1991 Referring Provider (PT): Brien Few, MD   Encounter Date: 10/27/2019  PT End of Session - 10/27/19 1117    Visit Number  10    Number of Visits  17    Date for PT Re-Evaluation  11/04/19    Authorization Type  [facility] FOCUS    PT Start Time  1110    PT Stop Time  1215    PT Time Calculation (min)  65 min    Activity Tolerance  Patient tolerated treatment well    Behavior During Therapy  [facility] for tasks assessed/performed;Anxious       Past Medical History:  Diagnosis Date  . Anxiety   . Depression   . Glomerulonephritis, poststreptococcal    age 16  . 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 Migraines     History reviewed. No pertinent surgical history.  There were no vitals filed for this visit.  Subjective Assessment - 10/27/19 1109    Subjective  Rib pain is pretty bad the same really.  It feels a little different.  The baby has dropped.  Instead of a crushing feeling, it is a pulling tearing feeling. Julie Webb is pleading to try to reduce pain wht TPDN . Please try to help this pain.    Patient is accompained by:  Family member    Diagnostic tests  x ray    Patient Stated Goals  Get relief of pain    Currently in Pain?  Yes    Pain Score  6     Pain Location  Rib cage    Pain Orientation  Right    Pain Descriptors / Indicators  Sharp;Spasm                       [facility] Adult PT Treatment/Exercise - 10/27/19 0001      Self-Care   Other Self-Care Comments   education for TPDN aftercare and precautians use of skeleton for education      Exercises   Other Exercises   physioball sitting and extending UE and trunk rotation for pain relief   . green physioball placed in corner for safety,  Wall clock  with RT UE from 3:00 to 12 noon and to 9:00 AM with increased movement after TPDN      Modalities   Modalities  Moist Heat      Moist Heat Therapy   Number Minutes Moist Heat  10 Minutes    Moist Heat Location  Other (comment)   RT ribs      Manual Therapy   Joint Mobilization  Rt rib mobs from Lateral, post and anterior- pressure with breathing    Soft tissue mobilization  intercostals thoracic        Trigger Point Dry Needling - 10/27/19 0001    Consent Given?  Yes    Education Handout Provided  Yes    Muscles Treated Back/Hip  Thoracic multifidi   over ribs as needed   Dry Needling Comments  anterior 5th and 6th rib 4 needlings over ribs 5/6    Other Dry Needling  .25 gage 29 mm    Thoracic multifidi response  Twitch response elicited;Palpable increased muscle length   Lt sidelying Ribs RT T-4 to T-9  PT Short Term Goals - 10/11/19 1242      PT SHORT TERM GOAL #1   Title  Pt will be able to use small postures and breathing techniques throughout her day to keep pain at a manageable level    Baseline  pt with relief with sidelying PRI exericise, improved breathing with self rib depression without long term effects    Status  Partially Met        PT Long Term Goals - 10/27/19 1239      PT LONG TERM GOAL #1   Title  Pt will be independent with long term HEP, with modifications in preparation for delivery    Baseline  continuing to work on pain management utilizing exericise, stretching and positioning    Time  8    Period  Weeks    Status  On-going    Target Date  11/04/19      PT LONG TERM GOAL #2   Title  Pt will be able to participate in work shift as needed with pain <=4/10    Baseline  6/10 today at beginning of RX.  Pt sore after TPDN but not in spasm  will continue    Time  8    Period  Weeks    Status  On-going      PT LONG TERM GOAL #3   Title  Pt will be able to ambulate for at least 20 min without rest break    Baseline  Pt at 29  weeks and fatigued due to pregnancy.  Pt rib pain limits her to light duty at work    Time  [place] - 10/27/19 1241    Clinical Impression Statement  Pt enters clinic at 6/10 pain in RT ribs.  Pt reports at 35 weeks baby has dropped but she is miserable with rib pain and strongly requests TPDN.  PT contacts Charlann Boxer DO for reinforcing permission and verbal order for TPDN and is approved since pt is out of 1st trimester and care was taken with TPDN over only bone over spine and ribs ( ant 5th and 6th rib) and posterior thoracic muscles at spine RT t -4 to T-9.  Pt tolerated well with decrease in muscle spasm.  Pt was sore after RX but was able to do wall clock with RT UE from  3;00 to 12 noon to 9;00 AM without spasming.  Pt did not better ability to rotate spine after RX.  Pt also shown techniques with physioball to decrease rib pain and to use during labor as needed. Will assess TPDN and continue if beneficial    Personal Factors and Comorbidities  Comorbidity 1    Comorbidities  35 weeks    Examination-Activity Limitations  Bathing;Locomotion Level;Transfers;Bed Mobility;Reach Overhead;Bend;Sit;Sleep;Dressing;Stand;Lift    Examination-Participation Restrictions  Meal Prep;Cleaning;Community Activity;Driving    Stability/Clinical Decision Making  Evolving/Moderate complexity    Clinical Decision Making  Moderate    Rehab Potential  Good    PT Frequency  2x / week    PT Duration  8 weeks    PT Treatment/Interventions  ADLs/Self Care Home Management;Cryotherapy;Moist Heat;Functional mobility training;Therapeutic activities;Therapeutic exercise;Patient/family education;Neuromuscular re-education;Manual techniques;Passive range of motion;Taping    PT Next Visit Plan  assess DN,  continue thoracic stretches at sink for prep for birth,    PT Home Exercise Plan  PRI 90-90  hip lift with Rt arm reach LT sidelying PRI exericise, door way stretch with  RUE on top of door frame, serratus strengthening.    Consulted and Agree with Plan of Care  Patient       Patient will benefit from skilled therapeutic intervention in order to improve the following deficits and impairments:  Impaired UE functional use, Increased muscle spasms, Decreased activity tolerance, Pain, Improper body mechanics, Postural dysfunction  Visit Diagnosis: Abnormal posture     Problem List Patient Active Problem List   Diagnosis Date Noted  . Nonallopathic lesion of rib cage 10/08/2019  . Rib pain on right side 10/07/2019  . Recurrent major depression in partial remission ([place]) 07/20/2019  . 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 Panic disorder 11/01/2018  . Generalized anxiety disorder 11/01/2018  . Chronic migraine 12/25/2017  . Suicidal behavior with attempted self-injury ([place])   . Tremor 10/05/2015  . Loss of weight 07/06/2015  . Social anxiety disorder 05/30/2015  . H/O acute post-streptococcal glomerulonephritis 05/30/2015  . Contraceptive management 05/30/2015  . Migraine 05/30/2015    Voncille Lo, PT Certified Exercise Expert for the Aging Adult  10/27/19 12:47 PM Phone: [phone] Fax: [place] [facility] [address] [place], [place], [address] Phone: [phone]   Fax:  [phone]  Name: Julie Webb MRN: [id number] Date of Birth: 30-Aug-1991

## 2019-11-01 ENCOUNTER — Ambulatory Visit: Payer: No Typology Code available for payment source | Admitting: Physical Therapy

## 2019-11-01 ENCOUNTER — Other Ambulatory Visit: Payer: Self-pay

## 2019-11-01 DIAGNOSIS — R293 Abnormal posture: Secondary | ICD-10-CM

## 2019-11-01 NOTE — Therapy (Signed)
[place], [place], [address] Phone: [phone]   Fax:  [phone]  Physical Therapy Treatment  Patient Details  Name: Julie Webb MRN: [id number] Date of Birth: [birth year] Referring Provider (PT): Brien Few, MD   Encounter Date: 11/01/2019  PT End of Session - 11/01/19 1154    Visit Number  11    Number of Visits  17    Date for PT Re-Evaluation  11/04/19    Authorization Type  [facility] FOCUS    PT Start Time  1150    PT Stop Time  2595    PT Time Calculation (min)  55 min       Past Medical History:  Diagnosis Date  . Anxiety   . Depression   . Glomerulonephritis, poststreptococcal    age 29  . 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 Migraines     No past surgical history on file.  There were no vitals filed for this visit.  Subjective Assessment - 11/01/19 1156    Subjective  Pt reports same pain level even after TPDN  7/10 now because I just vomited and feel really bad. My pain is same but i know I can move better. I am able to move my arms and take a deep breath without as sharp pain. I am able to vreathe more deeply after TPDN  Baby has also dropped since I am 36 week    Pertinent History  [redacted] weeks pregnant.    Diagnostic tests  x ray    Patient Stated Goals  Get relief of pain    Currently in Pain?  Yes    Pain Score  7     Pain Location  Rib cage    Pain Orientation  Right    Pain Descriptors / Indicators  Spasm;Sharp    Pain Onset  More than a month ago    Pain Frequency  Intermittent    Pain Relieving Factors  STM                       [facility] Adult PT Treatment/Exercise - 11/01/19 0001      Exercises   Other Exercises   physioball sitting and extending UE and trunk rotation for pain relief   . green physioball placed in corner for safety,  Wall clock with RT UE from 3:00 to 12 noon and to 9:00 AM with increased movement after TPDN      Modalities   Modalities  Moist Heat      Moist Heat Therapy   Number Minutes Moist Heat  12 Minutes    Moist Heat Location  Other (comment)   RT ribs      Manual Therapy   Manual Therapy  Soft tissue mobilization;Joint mobilization    Manual therapy comments  approximation of ribs with cues for breathing for gentle jt mobs of ribs   helped to decrease sharp pain   Joint Mobilization  Rt rib mobs from Lateral, post and anterior- pressure with breathing    Soft tissue mobilization  intercostals thoracic        Trigger Point Dry Needling - 11/01/19 0001    Consent Given?  Yes    Education Handout Provided  Previously provided    Muscles Treated Back/Hip  Thoracic multifidi   over ribs as needed   Dry Needling Comments  anterior 5th and 6th rib  multiple needlings over ribs 5/6 from post  to anterior over trigger points over bone    Other Dry Needling  .25 gage 30 mm  60 mm needle for multifidus of back.    Thoracic multifidi response  Twitch response elicited;Palpable increased muscle length   Lt sidelying Ribs RT T-4 to T-9            PT Short Term Goals - 10/11/19 1242      PT SHORT TERM GOAL #1   Title  Pt will be able to use small postures and breathing techniques throughout her day to keep pain at a manageable level    Baseline  pt with relief with sidelying PRI exericise, improved breathing with self rib depression without long term effects    Status  Partially Met        PT Long Term Goals - 10/27/19 1239      PT LONG TERM GOAL #1   Title  Pt will be independent with long term HEP, with modifications in preparation for delivery    Baseline  continuing to work on pain management utilizing exericise, stretching and positioning    Time  8    Period  Weeks    Status  On-going    [place] Date  11/04/19      PT LONG TERM GOAL #2   Title  Pt will be able to participate in work shift as needed with pain <=4/10    Baseline  6/10 today at beginning of RX.  Pt sore after TPDN but  not in spasm  will continue    Time  8    Period  Weeks    Status  On-going      PT LONG TERM GOAL #3   Title  Pt will be able to ambulate for at least 20 min without rest break    Baseline  Pt at 35 weeks and fatigued due to pregnancy.  Pt rib pain limits her to light duty at work    Time  [place] - 11/01/19 1327    Clinical Impression Statement  Pt enters clinic with 7/10 pain and decreased to 3/10 pain after RX and STM/approximation of ribs/Myofascial release followed by movement and exericise briefly before moist heat placement.  Pt reports being about to move better and breath more deeply after last treatment.  Pt also at 36 weeks and baby is dropping /descending inpreparation for eventual delivery    Personal Factors and Comorbidities  Comorbidity 1    Comorbidities  36    Examination-Activity Limitations  Bathing;Locomotion Level;Transfers;Bed Mobility;Reach Overhead;Bend;Sit;Sleep;Dressing;Stand;Lift    Examination-Participation Restrictions  Meal Prep;Cleaning;Community Activity;Driving    Clinical Decision Making  Moderate    Rehab Potential  Good    PT Frequency  2x / week    PT Duration  8 weeks    PT Treatment/Interventions  ADLs/Self Care Home Management;Cryotherapy;Moist Heat;Functional mobility training;Therapeutic activities;Therapeutic exercise;Patient/family education;Neuromuscular re-education;Manual techniques;Passive range of motion;Taping    PT Next Visit Plan  ERO next visit  continue DN    Consulted and Agree with Plan of Care  Patient       Patient will benefit from skilled therapeutic intervention in order to improve the following deficits and impairments:  Impaired UE functional use, Increased muscle spasms, Decreased activity tolerance, Pain, Improper body mechanics, Postural dysfunction  Visit Diagnosis: Abnormal posture     Problem List Patient Active  Problem List   Diagnosis Date Noted  .  Nonallopathic lesion of rib cage 10/08/2019  . Rib pain on right side 10/07/2019  . Recurrent major depression in partial remission ([place]) 07/20/2019  . 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 Panic disorder 11/01/2018  . Generalized anxiety disorder 11/01/2018  . Chronic migraine 12/25/2017  . Suicidal behavior with attempted self-injury ([place])   . Tremor 10/05/2015  . Loss of weight 07/06/2015  . Social anxiety disorder 05/30/2015  . H/O acute post-streptococcal glomerulonephritis 05/30/2015  . Contraceptive management 05/30/2015  . Migraine 05/30/2015    Voncille Lo, PT Certified Exercise Expert for the Aging Adult  11/01/19 1:31 PM Phone: [phone] Fax: [place] [facility] [address] [place], [place], [address] Phone: [phone]   Fax:  [phone]  Name: Julie Webb MRN: [id number] Date of Birth: 1991-08-31

## 2019-11-03 ENCOUNTER — Ambulatory Visit: Payer: No Typology Code available for payment source | Admitting: Physical Therapy

## 2019-11-03 ENCOUNTER — Other Ambulatory Visit: Payer: Self-pay

## 2019-11-03 ENCOUNTER — Ambulatory Visit (INDEPENDENT_AMBULATORY_CARE_PROVIDER_SITE_OTHER): Payer: No Typology Code available for payment source | Admitting: Family Medicine

## 2019-11-03 ENCOUNTER — Encounter: Payer: Self-pay | Admitting: Family Medicine

## 2019-11-03 DIAGNOSIS — R293 Abnormal posture: Secondary | ICD-10-CM | POA: Diagnosis not present

## 2019-11-03 DIAGNOSIS — M999 Biomechanical lesion, unspecified: Secondary | ICD-10-CM

## 2019-11-03 DIAGNOSIS — R0781 Pleurodynia: Secondary | ICD-10-CM | POA: Diagnosis not present

## 2019-11-03 DIAGNOSIS — M25611 Stiffness of right shoulder, not elsewhere classified: Secondary | ICD-10-CM

## 2019-11-03 NOTE — Assessment & Plan Note (Signed)
Decision today to treat with OMT was based on Physical Exam  After verbal consent patient was treated with HVLA, ME, FPR techniques in rib areas  Patient tolerated the procedure well with improvement in symptoms  Patient given exercises, stretches and lifestyle modifications  See medications in patient instructions if given  Patient will follow up in 4 weeks

## 2019-11-03 NOTE — Patient Instructions (Signed)
Good to see you Doing great  Continue PT See me again in 3-4 weeks if you don't have a baby

## 2019-11-03 NOTE — Assessment & Plan Note (Signed)
Better at this time.  We discussed with patient home exercises and icing regimen.  We will try some mild facilitated positional release today.  Patient should do relatively well.  Follow-up with me again in 3 to 4 weeks.

## 2019-11-03 NOTE — Therapy (Signed)
[place], [place], [address] Phone: [phone]   Fax:  [phone]  Physical Therapy Treatment/ERO/Re eval  Patient Details  Name: Julie Webb MRN: [id number] Date of Birth: 1991-09-08 Referring Provider (PT): Brien Few, MD  Charlann Boxer MD   Encounter Date: 11/03/2019  PT End of Session - 11/03/19 1115    Visit Number  12    Number of Visits  18    Date for PT Re-Evaluation  12/15/19    Authorization Type  [facility] FOCUS    PT Start Time  1110    PT Stop Time  1146    PT Time Calculation (min)  36 min    Activity Tolerance  Patient tolerated treatment well    Behavior During Therapy  [facility] for tasks assessed/performed;Anxious       Past Medical History:  Diagnosis Date  . Anxiety   . Depression   . Glomerulonephritis, poststreptococcal    age 7  . 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 Migraines     No past surgical history on file.  There were no vitals filed for this visit.  Subjective Assessment - 11/03/19 1116    Subjective  I think the dry needling is helping. My husband noticing I am dong better and moving better.  I had my parents and I over and deep cleaning the kitchen. I was able to mop for the first time since my rib pain became impossible in November. I was able to sleep without waking due to pain    Patient is accompained by:  Family member    Pertinent History  36 weeks    Diagnostic tests  x ray    Patient Stated Goals  Get relief of pain    Currently in Pain?  Yes    Pain Score  3     Pain Location  Rib cage    Pain Orientation  Right    Pain Descriptors / Indicators  Sharp;Spasm    Pain Onset  More than a month ago    Pain Frequency  Intermittent         [facility] PT Assessment - 11/03/19 0001      Precautions   Precaution Comments  [redacted] wks pregnant      Prior Function   Level of Independence  Independent    Vocation  Full time employment    Vocation  Requirements  ICU nurse      Posture/Postural Control   Posture Comments  Rt T10 rib depression/Lt elevation; longitudinal rotation Rt pelvis anterior      AROM   Overall AROM   Deficits    Right Shoulder Flexion  140 Degrees   pain limiting in ribs   Left Shoulder Flexion  165 Degrees      Palpation   Palpation comment  IR of Rt lower ribs  with improved mobility                   [facility] Adult PT Treatment/Exercise - 11/03/19 0001      Manual Therapy   Manual Therapy  Soft tissue mobilization;Joint mobilization    Manual therapy comments  approximation of ribs with cues for breathing for gentle jt mobs of ribs   helped to decrease sharp pain   Joint Mobilization  Rt rib mobs from Lateral, post and anterior- pressure with breathing    Soft tissue mobilization  intercostals thoracic  Trigger Point Dry Needling - 11/03/19 0001    Consent Given?  Yes    Education Handout Provided  Previously provided    Muscles Treated Back/Hip  Thoracic multifidi   over ribs as needed   Dry Needling Comments  anterior 5th and 6th rib  multiple needlings over ribs 5/6 from post to anterior over trigger points over bone    Other Dry Needling  .25 gage 30 mm  60 mm needle for multifidus of back.    Thoracic multifidi response  Twitch response elicited;Palpable increased muscle length   Lt sidelying Ribs RT T-4 to T-9            PT Short Term Goals - 11/03/19 1323      PT SHORT TERM GOAL #1   Title  Pt will be able to use small postures and breathing techniques throughout her day to keep pain at a manageable level    Baseline  pt with relief with sidelying PRI exericise, improved breathing with self rib depression without long term effects    Time  3    Period  Weeks    Status  Achieved        PT Long Term Goals - 11/03/19 1119      PT LONG TERM GOAL #1   Title  Pt will be independent with long term HEP, with modifications in preparation for delivery    Baseline   continuing to work on pain management utilizing exericise, stretching and positioning    Time  8    Period  Weeks    Status  On-going    [place] Date  12/15/19      PT LONG TERM GOAL #2   Title  Pt will be able to participate in work shift as needed with pain <=4/10    Baseline  have not been able to work since dry needling  this week  Today at 3/10    Time  8    Period  Weeks    Status  Partially Met      PT LONG TERM GOAL #3   Title  Pt will be able to ambulate for at least 20 min without rest break    Baseline  Pt at 36 weeks and fatigued due to pregnancy.  Pt rib pain limits at work.  Unable to assess until work this weekend    Time  6    Period  Weeks    Status  On-going    [place] Date  12/15/19      PT LONG TERM GOAL #4   Title  Pt will be able to sleep at night for at least 4of 7 nights without waking due to pain    Baseline  Pt has only been able to  sleep one night without pain    Time  6    Period  Weeks    Status  On-going    [place] Date  12/15/19      PT LONG TERM GOAL #5   Title  Pt will be able to manage light household chores without exacerbating pain using exercise and breathing techniques    Time  6    Period  Weeks    Status  New    [place] Date  12/15/19            Plan - 11/03/19 1130    Clinical Impression Statement  Pt returns to clinic with 3/10 pain and is pleased with current use of TPDN.  Pt was able to mop floor yesterday for first time since October and was able to sleep most of the night without waking due to rib pain.  Pt Shld flex on RT is 140( limited by rib pain) and LT is 165.   Pt was reevaluated today and she would continue to benefit from [facility] until delievery.  She is currently at 36 weeks. Pt husband as been excellent in carry over of massage at home and Ms Klee is better able to  do exeriicises. Pt will benefit from additional 6 weeks of skilled PT or until delivery of baby.  We will be glad to continue after delievery if necessary  with a new order.    Personal Factors and Comorbidities  Comorbidity 1    Comorbidities  36    Examination-Activity Limitations  Bathing;Locomotion Level;Transfers;Bed Mobility;Reach Overhead;Bend;Sit;Sleep;Dressing;Stand;Lift    Examination-Participation Restrictions  Meal Prep;Cleaning;Community Activity;Driving    Stability/Clinical Decision Making  Evolving/Moderate complexity    Clinical Decision Making  Moderate    Rehab Potential  Good    PT Frequency  2x / week    PT Duration  8 weeks    PT Treatment/Interventions  ADLs/Self Care Home Management;Cryotherapy;Moist Heat;Functional mobility training;Therapeutic activities;Therapeutic exercise;Patient/family education;Neuromuscular re-education;Manual techniques;Passive range of motion;Taping    PT Next Visit Plan  continue 1 x a week TPDN until delivery    PT Home Exercise Plan  PRI 90-90 hip lift with Rt arm reach LT sidelying PRI exericise, door way stretch with RUE on top of door frame, serratus strengthening.    Consulted and Agree with Plan of Care  Patient       Patient will benefit from skilled therapeutic intervention in order to improve the following deficits and impairments:  Impaired UE functional use, Increased muscle spasms, Decreased activity tolerance, Pain, Improper body mechanics, Postural dysfunction  Visit Diagnosis: Abnormal posture  Stiffness of right shoulder, not elsewhere classified     Problem List Patient Active Problem List   Diagnosis Date Noted  . Nonallopathic lesion of rib cage 10/08/2019  . Rib pain on right side 10/07/2019  . Recurrent major depression in partial remission ([place]) 07/20/2019  . 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 Panic disorder 11/01/2018  . Generalized anxiety disorder 11/01/2018  . Chronic migraine 12/25/2017  . Suicidal behavior with attempted self-injury ([place])   . Tremor 10/05/2015  . Loss of weight 07/06/2015  . Social anxiety disorder 05/30/2015  . H/O acute  post-streptococcal glomerulonephritis 05/30/2015  . Contraceptive management 05/30/2015  . Migraine 05/30/2015    Voncille Lo, PT Certified Exercise Expert for the Aging Adult  11/03/19 1:27 PM Phone: [phone] Fax: [place] [facility] [address] [place], [place], [address] Phone: [phone]   Fax:  [phone]  Name: OONA TRAMMEL MRN: [id number] Date of Birth: 02-Dec-1990

## 2019-11-03 NOTE — Progress Notes (Signed)
[place] [address] [place] Phone: [phone] Subjective:   I [name] am serving as a Education administrator for Dr. Hulan Saas.  This visit occurred during the SARS-CoV-2 public health emergency.  Safety protocols were in place, including screening questions prior to the visit, additional usage of staff PPE, and extensive cleaning of exam room while observing appropriate contact time as indicated for disinfecting solutions.   I'm seeing this patient by the request  of:    CC: Right-sided neck pain follow-up  [id number]     Update 11/03/2019 Julie Webb is a 29 y.o. female coming in with complaint of back pain. Last seen on 10/07/2019 for OMT. Patient states she is in PT and has been doing dry needling. States the dry needling has been helping a lot.  Patient states that the pain is significantly improved at this time.  Making significant progress.  Would state about 80 to 85% better.  Patient will continue with physical therapy she thinks.  Doing the home exercises well.    Past Medical History:  Diagnosis Date  . Anxiety   . Depression   . Glomerulonephritis, poststreptococcal    age 74  . 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 Migraines    No past surgical history on file. Social History   Socioeconomic History  . Marital status: Married    Spouse name: Not on file  . Number of children: Not on file  . Years of education: Not on file  . Highest education level: Not on file  Occupational History  . Not on file  Tobacco Use  . Smoking status: Never Smoker  . Smokeless tobacco: Never Used  Substance and Sexual Activity  . Alcohol use: Yes    Comment: occasionally- once a month if then pt reports  . Drug use: No  . Sexual activity: Yes    Birth control/protection: Condom, None  Other Topics Concern  . 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 Difficulty of Paying Living Expenses: Not on file  Food Insecurity:   . Worried About Charity fundraiser in the Last Year: Not on file  . Ran Out of Food in the Last Year: Not on file  Transportation Needs:   . Lack of Transportation (Medical): Not on file  . Lack of Transportation (Non-Medical): Not on file  Physical Activity:   . Days of Exercise per Week: Not on file  . Minutes of Exercise per Session: Not on file  Stress:   . Feeling of Stress : Not on file  Social Connections:   . Frequency of Communication with Friends and Family: Not on file  . Frequency of Social Gatherings with Friends and Family: Not on file  . Attends Religious Services: Not on file  . Active Member of Clubs or Organizations: Not on file  . Attends Archivist Meetings: Not on file  . Marital Status: Not on file   Allergies  Allergen Reactions  . Ibuprofen Other (See Comments)    Kidney failure  . Phenergan [Promethazine Hcl] Other (See Comments)    Restlessness w/ IV Phenergen. Probable mild EPS. Tolerates if taken w/  Benadryl.    Family History  Problem Relation Age of Onset  . Hyperlipidemia Mother   . Mental illness Mother   . Fibroids Mother        uterine  . Hypertension Father   . Heart attack Father   . Heart disease Maternal Grandmother   . Hyperlipidemia Maternal Grandmother   . Hypertension Maternal Grandmother   . Heart disease Maternal Grandfather   . Hyperlipidemia Maternal Grandfather   . Hypertension Maternal Grandfather   . Pancreatic cancer Paternal Grandmother   . Mesothelioma Paternal Grandfather       Current Outpatient Medications (Respiratory):  .  promethazine (PHENERGAN) 12.5 MG tablet, Take 2 tablets (25 mg total) by mouth every 6 (six) hours as needed for nausea or vomiting.  Current Outpatient  Medications (Analgesics):  .  butalbital-acetaminophen-caffeine (FIORICET, ESGIC) 50-325-40 MG tablet, Take 1 tablet by mouth every 6 (six) hours as needed for headache.   Current Outpatient Medications (Other):  Marland Kitchen  DULoxetine (CYMBALTA) 60 MG capsule, Take 1 capsule (60 mg total) by mouth at bedtime. .  ondansetron (ZOFRAN ODT) 8 MG disintegrating tablet, Take 1 tablet (8 mg total) by mouth every 8 (eight) hours as needed for nausea or vomiting. .  Prenat w/o A-FeCbn-DSS-FA-DHA (PRENAISSANCE PLUS) 28-1-250 MG CAPS, Take 1 capsule by mouth daily.    Past medical history, social, surgical and family history all reviewed in electronic medical record.  No pertanent information unless stated regarding to the chief complaint.   Review of Systems:  No headache, visual changes, nausea, vomiting, diarrhea, constipation, dizziness, abdominal pain, skin rash, fevers, chills, night sweats, weight loss, swollen lymph nodes, body aches, joint swelling, muscle aches, chest pain, shortness of breath, mood changes.   Objective  Blood pressure 104/70, pulse 85, height 5\' 4"  (1.626 m), weight 165 lb (74.8 kg), last menstrual period 02/23/2019, SpO2 97 %.    General: No apparent distress alert and oriented x3 mood and affect normal, dressed appropriately.  HEENT: Pupils equal, extraocular movements intact  Respiratory: Patient's speak in full sentences and does not appear short of breath  Cardiovascular: No lower extremity edema, non tender, no erythema  Skin: Warm dry intact with no signs of infection or rash on extremities or on axial skeleton.  Abdomen: Gravid Neuro: Cranial nerves II through XII are intact, neurovascularly intact in all extremities with 2+ DTRs and 2+ pulses.  Lymph: No lymphadenopathy of posterior or anterior cervical chain or axillae bilaterally.  Gait normal with good balance and coordination.  MSK: Still some very mild tenderness to palpation in the T7-T9 area on the right  side.  Osteopathic findings    Impression and Recommendations:     This case required medical decision making of moderate complexity. The above documentation has been reviewed and is accurate and complete Julie Pulley, DO       Note: This dictation was prepared with Dragon dictation along with smaller phrase technology. Any transcriptional errors that result from this process are unintentional.

## 2019-11-15 ENCOUNTER — Ambulatory Visit: Payer: No Typology Code available for payment source | Admitting: Physical Therapy

## 2019-11-16 ENCOUNTER — Ambulatory Visit: Payer: No Typology Code available for payment source | Admitting: Psychiatry

## 2019-11-20 ENCOUNTER — Other Ambulatory Visit: Payer: Self-pay

## 2019-11-20 ENCOUNTER — Encounter (HOSPITAL_COMMUNITY): Payer: Self-pay | Admitting: Obstetrics and Gynecology

## 2019-11-20 ENCOUNTER — Inpatient Hospital Stay (EMERGENCY_DEPARTMENT_HOSPITAL)
Admission: AD | Admit: 2019-11-20 | Discharge: 2019-11-20 | Disposition: A | Discharge status: Home / Self Care | Payer: No Typology Code available for payment source | Source: Ambulatory Visit | Attending: Obstetrics and Gynecology | Admitting: Obstetrics and Gynecology

## 2019-11-20 DIAGNOSIS — Z3689 Encounter for other specified antenatal screening: Secondary | ICD-10-CM

## 2019-11-20 DIAGNOSIS — Z3A38 38 weeks gestation of pregnancy: Secondary | ICD-10-CM

## 2019-11-20 DIAGNOSIS — O471 False labor at or after 37 completed weeks of gestation: Secondary | ICD-10-CM

## 2019-11-20 DIAGNOSIS — O479 False labor, unspecified: Secondary | ICD-10-CM

## 2019-11-20 NOTE — MAU Note (Signed)
Pt reports to MAU c/o ctx every 5 min. No bleeding or LOF.+Fm.   Temp:97.4 BP:138/86 HR:76 SPO2:98 Pain: 5/10 170.3lbs

## 2019-11-20 NOTE — Discharge Instructions (Signed)

## 2019-11-20 NOTE — MAU Provider Note (Signed)
S: Ms. Julie Webb is a 29 y.o. G3P0020 at [redacted]w[redacted]d  who presents to MAU today for labor evaluation.     Cervical exam by RN:  Dilation: 1.5 Presentation: Undeterminable Exam by:: L. Cox, RN  Fetal Monitoring: Baseline: 120 Variability: Moderate Accelerations: Present Decelerations: None Contractions: Q1-89min  MDM Discussed patient with RN. NST reviewed.   A: SIUP at [redacted]w[redacted]d  False labor NST Reactive  P: Discharge home Labor precautions and kick counts included in AVS Patient to follow-up with primary ob as scheduled  Patient may return to MAU as needed or when in labor   Gavin Pound, [place] 11/20/2019 5:43 AM

## 2019-11-21 ENCOUNTER — Encounter (HOSPITAL_COMMUNITY): Payer: Self-pay | Admitting: *Deleted

## 2019-11-21 ENCOUNTER — Telehealth (HOSPITAL_COMMUNITY): Payer: Self-pay | Admitting: *Deleted

## 2019-11-21 ENCOUNTER — Other Ambulatory Visit: Payer: Self-pay | Admitting: Obstetrics and Gynecology

## 2019-11-21 NOTE — Telephone Encounter (Signed)
Preadmission screen  

## 2019-11-22 ENCOUNTER — Other Ambulatory Visit (HOSPITAL_COMMUNITY)
Admission: RE | Admit: 2019-11-22 | Discharge: 2019-11-22 | Disposition: A | Discharge status: Home / Self Care | Payer: No Typology Code available for payment source | Source: Ambulatory Visit | Attending: Obstetrics and Gynecology | Admitting: Obstetrics and Gynecology

## 2019-11-22 ENCOUNTER — Ambulatory Visit: Payer: No Typology Code available for payment source | Admitting: Physical Therapy

## 2019-11-22 LAB — SARS CORONAVIRUS 2 (TAT 6-24 HRS): SARS Coronavirus 2: NEGATIVE

## 2019-11-22 NOTE — H&P (Signed)
Julie Webb is a 29 y.o. female presenting for IOL at 69 wks with history of severe rib pain and deformity. OB History    Gravida  3   Para      Term      Preterm      AB  2   Living        SAB  2   TAB      Ectopic      Multiple      Live Births             Past Medical History:  Diagnosis Date  . Anxiety   . Depression   . Glomerulonephritis, poststreptococcal    age 65  . 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 Migraines   . Vaginal Pap smear, abnormal    Past Surgical History:  Procedure Laterality Date  . NO PAST SURGERIES     Family History: family history includes Fibroids in her mother; Heart attack in her father; Heart disease in her maternal grandfather and maternal grandmother; Hyperlipidemia in her maternal grandfather, maternal grandmother, and mother; Hypertension in her father, maternal grandfather, and maternal grandmother; Mental illness in her mother; Mesothelioma in her paternal grandfather; Pancreatic cancer in her paternal grandmother. Social History:  reports that she has never smoked. She has never used smokeless tobacco. She reports previous alcohol use. She reports that she does not use drugs.     Maternal Diabetes: No Genetic Screening: Normal Maternal Ultrasounds/Referrals: Normal Fetal Ultrasounds or other Referrals:  None Maternal Substance Abuse:  No Significant Maternal Medications:  Meds include: Other:  Cymbalta Significant Maternal Lab Results:  Group B Strep positive Other Comments:  None  Review of Systems  Constitutional: Negative.   All other systems reviewed and are negative.  Maternal Medical History:  Reason for admission: Contractions.   Contractions: Onset was more than 2 days ago.   Frequency: irregular.   Perceived severity is moderate.    Fetal activity: Perceived fetal activity is normal.   Last perceived fetal movement was greater than 24 hours ago.    Prenatal complications: no prenatal  complications Prenatal Complications - Diabetes: none.      Last menstrual period 02/23/2019. Maternal Exam:  Uterine Assessment: Contraction strength is mild.  Contraction frequency is irregular.   Abdomen: Patient reports no abdominal tenderness. Fetal presentation: vertex  Introitus: Normal vulva. Ferning test: negative.  Nitrazine test: negative. Amniotic fluid character: not assessed.  Pelvis: questionable for delivery.      Physical Exam  Vitals reviewed. Constitutional: She is oriented to person, place, and time. She appears well-developed and well-nourished.  HENT:  Head: Normocephalic and atraumatic.  Cardiovascular: Normal rate and regular rhythm.  Respiratory: Effort normal and breath sounds normal.  GI: Bowel sounds are normal.  Genitourinary:    Vulva, vagina and uterus normal.   Musculoskeletal:        General: Normal range of motion.     Cervical back: Normal range of motion and neck supple.  Neurological: She is alert and oriented to person, place, and time. She has normal reflexes.  Skin: Skin is warm and dry.  Psychiatric: She has a normal mood and affect.    Prenatal labs: ABO, Rh: A/Positive/-- (07/23 0000) Antibody: Negative (07/23 0000) Rubella: Nonimmune (07/23 0000) RPR: Nonreactive (07/23 0000)  HBsAg: Negative (07/23 0000)  HIV: Non-reactive (07/23 0000)  GBS: Positive/-- (08/05 0000)   Assessment/Plan: 69 wK iup Rib deformity with intractable pain IOL  Julie Webb J 11/22/2019, 9:51 PM

## 2019-11-23 ENCOUNTER — Inpatient Hospital Stay (HOSPITAL_COMMUNITY): Payer: No Typology Code available for payment source

## 2019-11-23 ENCOUNTER — Inpatient Hospital Stay (HOSPITAL_COMMUNITY): Payer: No Typology Code available for payment source | Admitting: Anesthesiology

## 2019-11-23 ENCOUNTER — Inpatient Hospital Stay (HOSPITAL_COMMUNITY)
Admission: RE | Admit: 2019-11-23 | Discharge: 2019-11-25 | DRG: 807 | Disposition: A | Discharge status: Home / Self Care | Payer: No Typology Code available for payment source | Attending: Obstetrics and Gynecology | Admitting: Obstetrics and Gynecology

## 2019-11-23 ENCOUNTER — Encounter (HOSPITAL_COMMUNITY): Payer: Self-pay | Admitting: Obstetrics and Gynecology

## 2019-11-23 ENCOUNTER — Other Ambulatory Visit: Payer: Self-pay

## 2019-11-23 DIAGNOSIS — Z20822 Contact with and (suspected) exposure to covid-19: Secondary | ICD-10-CM | POA: Diagnosis present

## 2019-11-23 DIAGNOSIS — F329 Major depressive disorder, single episode, unspecified: Secondary | ICD-10-CM | POA: Diagnosis present

## 2019-11-23 DIAGNOSIS — O99824 Streptococcus B carrier state complicating childbirth: Secondary | ICD-10-CM | POA: Diagnosis present

## 2019-11-23 DIAGNOSIS — M954 Acquired deformity of chest and rib: Secondary | ICD-10-CM | POA: Diagnosis present

## 2019-11-23 DIAGNOSIS — O26893 Other specified pregnancy related conditions, third trimester: Secondary | ICD-10-CM | POA: Diagnosis present

## 2019-11-23 DIAGNOSIS — O99344 Other mental disorders complicating childbirth: Secondary | ICD-10-CM | POA: Diagnosis present

## 2019-11-23 DIAGNOSIS — Z3A39 39 weeks gestation of pregnancy: Secondary | ICD-10-CM

## 2019-11-23 DIAGNOSIS — Z3490 Encounter for supervision of normal pregnancy, unspecified, unspecified trimester: Secondary | ICD-10-CM | POA: Diagnosis present

## 2019-11-23 LAB — CBC
HCT: 40 % (ref 36.0–46.0)
Hemoglobin: 13.5 g/dL (ref 12.0–15.0)
MCH: 29.9 pg (ref 26.0–34.0)
MCHC: 33.8 g/dL (ref 30.0–36.0)
MCV: 88.5 fL (ref 80.0–100.0)
Platelets: 218 10*3/uL (ref 150–400)
RBC: 4.52 MIL/uL (ref 3.87–5.11)
RDW: 13.6 % (ref 11.5–15.5)
WBC: 8.3 10*3/uL (ref 4.0–10.5)
nRBC: 0 % (ref 0.0–0.2)

## 2019-11-23 LAB — RPR: RPR Ser Ql: NONREACTIVE

## 2019-11-23 LAB — TYPE AND SCREEN
ABO/RH(D): A POS
Antibody Screen: NEGATIVE

## 2019-11-23 MED ORDER — LACTATED RINGERS IV SOLN: INTRAVENOUS | Status: DC

## 2019-11-23 MED ORDER — OXYCODONE-ACETAMINOPHEN 5-325 MG PO TABS: 1.0000 | ORAL_TABLET | ORAL | Status: DC | PRN

## 2019-11-23 MED ORDER — TETANUS-DIPHTH-ACELL PERTUSSIS 5-2.5-18.5 LF-MCG/0.5 IM SUSP: 0.5000 mL | Freq: Once | INTRAMUSCULAR | Status: DC

## 2019-11-23 MED ORDER — OXYCODONE-ACETAMINOPHEN 5-325 MG PO TABS: 2.0000 | ORAL_TABLET | ORAL | Status: DC | PRN

## 2019-11-23 MED ORDER — EPHEDRINE 5 MG/ML INJ: 10.0000 mg | INTRAVENOUS | Status: DC | PRN

## 2019-11-23 MED ORDER — BENZOCAINE-MENTHOL 20-0.5 % EX AERO
1.0000 "application " | INHALATION_SPRAY | CUTANEOUS | Status: DC | PRN
  Administered 2019-11-23: 1 via TOPICAL
  Filled 2019-11-23: qty 56

## 2019-11-23 MED ORDER — PHENYLEPHRINE 40 MCG/ML (10ML) SYRINGE FOR IV PUSH (FOR BLOOD PRESSURE SUPPORT): 80.0000 ug | PREFILLED_SYRINGE | INTRAVENOUS | Status: DC | PRN

## 2019-11-23 MED ORDER — WITCH HAZEL-GLYCERIN EX PADS: 1.0000 "application " | MEDICATED_PAD | CUTANEOUS | Status: DC | PRN

## 2019-11-23 MED ORDER — ONDANSETRON HCL 4 MG PO TABS: 4.0000 mg | ORAL_TABLET | ORAL | Status: DC | PRN

## 2019-11-23 MED ORDER — DULOXETINE HCL 60 MG PO CPEP
60.0000 mg | ORAL_CAPSULE | Freq: Every day | ORAL | Status: DC
  Administered 2019-11-23 – 2019-11-24 (×2): 60 mg via ORAL
  Filled 2019-11-23 (×3): qty 1

## 2019-11-23 MED ORDER — ACETAMINOPHEN 325 MG PO TABS
650.0000 mg | ORAL_TABLET | ORAL | Status: DC | PRN
  Administered 2019-11-23 – 2019-11-24 (×2): 650 mg via ORAL
  Filled 2019-11-23 (×2): qty 2

## 2019-11-23 MED ORDER — SOD CITRATE-CITRIC ACID 500-334 MG/5ML PO SOLN: 30.0000 mL | ORAL | Status: DC | PRN

## 2019-11-23 MED ORDER — COCONUT OIL OIL: 1.0000 "application " | TOPICAL_OIL | Status: DC | PRN

## 2019-11-23 MED ORDER — ONDANSETRON HCL 4 MG/2ML IJ SOLN: 4.0000 mg | INTRAMUSCULAR | Status: DC | PRN

## 2019-11-23 MED ORDER — LACTATED RINGERS IV SOLN: 500.0000 mL | Freq: Once | INTRAVENOUS | Status: DC

## 2019-11-23 MED ORDER — ACETAMINOPHEN 325 MG PO TABS: 650.0000 mg | ORAL_TABLET | ORAL | Status: DC | PRN

## 2019-11-23 MED ORDER — ZOLPIDEM TARTRATE 5 MG PO TABS: 5.0000 mg | ORAL_TABLET | Freq: Every evening | ORAL | Status: DC | PRN

## 2019-11-23 MED ORDER — PENICILLIN G POT IN DEXTROSE 60000 UNIT/ML IV SOLN
3.0000 10*6.[IU] | INTRAVENOUS | Status: DC
  Administered 2019-11-23 (×4): 3 10*6.[IU] via INTRAVENOUS
  Filled 2019-11-23 (×4): qty 50

## 2019-11-23 MED ORDER — PHENYLEPHRINE 40 MCG/ML (10ML) SYRINGE FOR IV PUSH (FOR BLOOD PRESSURE SUPPORT)
80.0000 ug | PREFILLED_SYRINGE | INTRAVENOUS | Status: DC | PRN
  Filled 2019-11-23: qty 10

## 2019-11-23 MED ORDER — SODIUM CHLORIDE (PF) 0.9 % IJ SOLN
INTRAMUSCULAR | Status: DC | PRN
  Administered 2019-11-23: 12 mL/h via EPIDURAL

## 2019-11-23 MED ORDER — ONDANSETRON HCL 4 MG/2ML IJ SOLN
4.0000 mg | Freq: Four times a day (QID) | INTRAMUSCULAR | Status: DC | PRN
  Administered 2019-11-23 (×2): 4 mg via INTRAVENOUS
  Filled 2019-11-23 (×2): qty 2

## 2019-11-23 MED ORDER — SENNOSIDES-DOCUSATE SODIUM 8.6-50 MG PO TABS
2.0000 | ORAL_TABLET | ORAL | Status: DC
  Administered 2019-11-23 – 2019-11-24 (×2): 2 via ORAL
  Filled 2019-11-23 (×2): qty 2

## 2019-11-23 MED ORDER — TERBUTALINE SULFATE 1 MG/ML IJ SOLN: 0.2500 mg | Freq: Once | INTRAMUSCULAR | Status: DC | PRN

## 2019-11-23 MED ORDER — DIPHENHYDRAMINE HCL 25 MG PO CAPS: 25.0000 mg | ORAL_CAPSULE | Freq: Four times a day (QID) | ORAL | Status: DC | PRN

## 2019-11-23 MED ORDER — DIBUCAINE (PERIANAL) 1 % EX OINT: 1.0000 "application " | TOPICAL_OINTMENT | CUTANEOUS | Status: DC | PRN

## 2019-11-23 MED ORDER — OXYTOCIN 40 UNITS IN NORMAL SALINE INFUSION - SIMPLE MED
1.0000 m[IU]/min | INTRAVENOUS | Status: DC
  Administered 2019-11-23: 2 m[IU]/min via INTRAVENOUS

## 2019-11-23 MED ORDER — LIDOCAINE HCL (PF) 1 % IJ SOLN: 30.0000 mL | INTRAMUSCULAR | Status: DC | PRN

## 2019-11-23 MED ORDER — METHYLERGONOVINE MALEATE 0.2 MG/ML IJ SOLN: 0.2000 mg | INTRAMUSCULAR | Status: DC | PRN

## 2019-11-23 MED ORDER — MISOPROSTOL 25 MCG QUARTER TABLET
25.0000 ug | ORAL_TABLET | ORAL | Status: DC | PRN
  Administered 2019-11-23: 25 ug via VAGINAL
  Filled 2019-11-23: qty 1

## 2019-11-23 MED ORDER — DIPHENHYDRAMINE HCL 50 MG/ML IJ SOLN: 12.5000 mg | INTRAMUSCULAR | Status: DC | PRN

## 2019-11-23 MED ORDER — SIMETHICONE 80 MG PO CHEW: 80.0000 mg | CHEWABLE_TABLET | ORAL | Status: DC | PRN

## 2019-11-23 MED ORDER — OXYTOCIN BOLUS FROM INFUSION
500.0000 mL | Freq: Once | INTRAVENOUS | Status: AC
  Administered 2019-11-23: 500 mL via INTRAVENOUS

## 2019-11-23 MED ORDER — LIDOCAINE HCL (PF) 1 % IJ SOLN
INTRAMUSCULAR | Status: DC | PRN
  Administered 2019-11-23: 4 mL via EPIDURAL
  Administered 2019-11-23: 5 mL via EPIDURAL

## 2019-11-23 MED ORDER — SODIUM CHLORIDE 0.9 % IV SOLN
5.0000 10*6.[IU] | Freq: Once | INTRAVENOUS | Status: AC
  Administered 2019-11-23: 5 10*6.[IU] via INTRAVENOUS
  Filled 2019-11-23: qty 5

## 2019-11-23 MED ORDER — LACTATED RINGERS IV SOLN: 500.0000 mL | INTRAVENOUS | Status: DC | PRN

## 2019-11-23 MED ORDER — PRENATAL MULTIVITAMIN CH
1.0000 | ORAL_TABLET | Freq: Every day | ORAL | Status: DC
  Administered 2019-11-24: 1 via ORAL
  Filled 2019-11-23 (×2): qty 1

## 2019-11-23 MED ORDER — METHYLERGONOVINE MALEATE 0.2 MG PO TABS: 0.2000 mg | ORAL_TABLET | ORAL | Status: DC | PRN

## 2019-11-23 MED ORDER — FENTANYL-BUPIVACAINE-NACL 0.5-0.125-0.9 MG/250ML-% EP SOLN
12.0000 mL/h | EPIDURAL | Status: DC | PRN
  Filled 2019-11-23: qty 250

## 2019-11-23 MED ORDER — OXYTOCIN 40 UNITS IN NORMAL SALINE INFUSION - SIMPLE MED
2.5000 [IU]/h | INTRAVENOUS | Status: DC
  Filled 2019-11-23: qty 1000

## 2019-11-23 MED ORDER — IBUPROFEN 600 MG PO TABS
600.0000 mg | ORAL_TABLET | Freq: Four times a day (QID) | ORAL | Status: DC
  Administered 2019-11-24 – 2019-11-25 (×4): 600 mg via ORAL
  Filled 2019-11-23 (×5): qty 1

## 2019-11-23 NOTE — Progress Notes (Signed)
Julie Webb is a 29 y.o. G3P0020 at [redacted]w[redacted]d by LMP admitted for induction of labor due to debilitating rib pain and deformity.  Subjective: Comfortable with epidural  Objective: BP 115/78   Pulse 87   Temp 98.1 F (36.7 C) (Oral)   Resp 18   Ht 5\' 4"  (1.626 m)   Wt 77.7 kg   LMP 02/23/2019 (Exact Date)   BMI 29.39 kg/m  No intake/output data recorded. No intake/output data recorded.  FHT:  FHR: 155 bpm, variability: moderate,  accelerations:  Present,  decelerations:  Absent UC:   irregular, every 1-3 minutes, SVE:   Dilation: 5.5 Effacement (%): 80 Station: -1 Exam by:: Dr. Ronita Hipps  IUPC placed without difficulty Bloody show noted  Labs: Lab Results  Component Value Date   WBC 8.3 11/23/2019   HGB 13.5 11/23/2019   HCT 40.0 11/23/2019   MCV 88.5 11/23/2019   PLT 218 11/23/2019    Assessment / Plan: Induction of labor due to above,  progressing well on pitocin  Labor: Progressing normally Preeclampsia:  no signs or symptoms of toxicity Fetal Wellbeing:  Category I Pain Control:  Epidural I/D:  n/a Anticipated MOD:  NSVD  Julie Webb 11/23/2019, 1:16 PM

## 2019-11-23 NOTE — Progress Notes (Signed)
Julie Webb is a 29 y.o. G3P0020 at [redacted]w[redacted]d by LMP admitted for induction of labor due to debilitating rib pain and deformity.  Subjective: Comfortable with epidural  Objective: BP 122/74   Pulse 85   Temp 98.2 F (36.8 C) (Oral)   Resp 16   Ht 5\' 4"  (1.626 m)   Wt 77.7 kg   LMP 02/23/2019 (Exact Date)   BMI 29.39 kg/m  No intake/output data recorded. No intake/output data recorded.  FHT:  FHR: 155 bpm, variability: moderate,  accelerations:  Present,  decelerations:  Absent UC:   irregular, every 1-3 minutes, SVE:   Dilation: 9 Effacement (%): 90 Station: 0 Exam by:: Rosana Hoes, RN   IUPC placed and replaced but not functioning  Labs: Lab Results  Component Value Date   WBC 8.3 11/23/2019   HGB 13.5 11/23/2019   HCT 40.0 11/23/2019   MCV 88.5 11/23/2019   PLT 218 11/23/2019    Assessment / Plan: Induction of labor due to above,  progressing well on pitocin Active phase progress  Labor: Progressing normally Preeclampsia:  no signs or symptoms of toxicity Fetal Wellbeing:  Category I Pain Control:  Epidural I/D:  n/a Anticipated MOD:  NSVD  Julie Webb 11/23/2019, 6:47 PM

## 2019-11-23 NOTE — Anesthesia Procedure Notes (Signed)
Epidural Patient location during procedure: OB Start time: 11/23/2019 9:39 AM End time: 11/23/2019 9:42 AM  Staffing Anesthesiologist: Audry Pili, MD Performed: anesthesiologist   Preanesthetic Checklist Completed: patient identified, IV checked, risks and benefits discussed, monitors and equipment checked, pre-op evaluation and timeout performed  Epidural Patient position: sitting Prep: DuraPrep Patient monitoring: continuous pulse ox and blood pressure Approach: midline Location: L2-L3 Injection technique: LOR saline  Needle:  Needle type: Tuohy  Needle gauge: 17 G Needle length: 9 cm Needle insertion depth: 6 cm Catheter size: 19 Gauge Catheter at skin depth: 11 cm Test dose: negative and Other (1% lidocaine)  Assessment Events: blood not aspirated  Additional Notes Patient identified. Risks including, but not limited to, bleeding, infection, nerve damage, paralysis, inadequate analgesia, blood pressure changes, nausea, vomiting, allergic reaction, postpartum back pain, itching, and headache were discussed. Patient expressed understanding and wished to proceed. Sterile prep and drape, including hand hygiene, mask, and sterile gloves were used. The patient was positioned and the spine was prepped. The skin was anesthetized with lidocaine. No paraesthesia or other complication noted. The patient did not experience any signs of intravascular injection such as tinnitus or metallic taste in mouth, nor signs of intrathecal spread such as rapid motor block. Please see nursing notes for vital signs. The patient tolerated the procedure well.   Renold Don, MDReason for block:procedure for pain

## 2019-11-23 NOTE — Lactation Note (Signed)
This note was copied from a baby's chart. Lactation Consultation Note Baby 3 hrs old. Mom has called out for [facility]. When [place] came to room mom eating supper. LC will return.  Patient Name: Julie Webb [id number] Date: 11/23/2019     Maternal Data    Feeding Feeding Type: Breast Fed  LATCH Score Latch: Grasps breast easily, tongue down, lips flanged, rhythmical sucking.  Audible Swallowing: Spontaneous and intermittent  Type of Nipple: Everted at rest and after stimulation  Comfort (Breast/Nipple): Soft / non-tender  Hold (Positioning): No assistance needed to correctly position infant at breast.  LATCH Score: 10  Interventions    Lactation Tools Discussed/Used     Consult Status      Theodoro Kalata 11/23/2019, 11:48 PM

## 2019-11-23 NOTE — Anesthesia Preprocedure Evaluation (Signed)
Anesthesia Evaluation  Patient identified by MRN, date of birth, ID band Patient awake    Reviewed: Allergy & Precautions, NPO status , Patient's Chart, lab work & pertinent test results  History of Anesthesia Complications Negative for: history of anesthetic complications  Airway Mallampati: II   Neck ROM: Full    Dental   Pulmonary neg pulmonary ROS,    Pulmonary exam normal        Cardiovascular negative cardio ROS Normal cardiovascular exam     Neuro/Psych  Headaches, PSYCHIATRIC DISORDERS Anxiety Depression  Hx suicidal behavior   GI/Hepatic negative GI ROS, Neg liver ROS,   Endo/Other  negative endocrine ROS  Renal/GU Renal disease (hx glomerulonephritis)     Musculoskeletal negative musculoskeletal ROS (+)   Abdominal   Peds  Hematology negative hematology ROS (+)  Plt 218k    Anesthesia Other Findings HSV-2 Covid neg 2/2  Reproductive/Obstetrics                             Anesthesia Physical Anesthesia Plan  ASA: II  Anesthesia Plan: Epidural   Post-op Pain Management:    Induction:   PONV Risk Score and Plan: 2 and Treatment may vary due to age or medical condition  Airway Management Planned: Natural Airway  Additional Equipment: None  Intra-op Plan:   Post-operative Plan:   Informed Consent: I have reviewed the patients History and Physical, chart, labs and discussed the procedure including the risks, benefits and alternatives for the proposed anesthesia with the patient or authorized representative who has indicated his/her understanding and acceptance.       Plan Discussed with: Anesthesiologist  Anesthesia Plan Comments: (Labs reviewed. Platelets acceptable, patient not taking any blood thinning medications. Per RN, FHR tracing reported to be stable enough for sitting procedure. Risks and benefits discussed with patient, including PDPH, backache,  epidural hematoma, failed epidural, blood pressure changes, allergic reaction, and nerve injury. Patient expressed understanding and wished to proceed.)        Anesthesia Quick Evaluation

## 2019-11-24 ENCOUNTER — Ambulatory Visit: Payer: No Typology Code available for payment source | Admitting: Family Medicine

## 2019-11-24 LAB — CBC
HCT: 33.7 % — ABNORMAL LOW (ref 36.0–46.0)
Hemoglobin: 11.3 g/dL — ABNORMAL LOW (ref 12.0–15.0)
MCH: 30.1 pg (ref 26.0–34.0)
MCHC: 33.5 g/dL (ref 30.0–36.0)
MCV: 89.6 fL (ref 80.0–100.0)
Platelets: 187 10*3/uL (ref 150–400)
RBC: 3.76 MIL/uL — ABNORMAL LOW (ref 3.87–5.11)
RDW: 13.7 % (ref 11.5–15.5)
WBC: 12.4 10*3/uL — ABNORMAL HIGH (ref 4.0–10.5)
nRBC: 0 % (ref 0.0–0.2)

## 2019-11-24 LAB — ABO/RH: ABO/RH(D): A POS

## 2019-11-24 NOTE — Lactation Note (Signed)
This note was copied from a baby's chart. Lactation Consultation Note Baby 58 hrs old. Sleepy. Mom attempting to latch. Mom has short shaft nipples. Has worn shells 1 hr. Shells some helpful. Baby doesn't act to interested in BF. Baby fussy at the breast. Baby may have posterior tight frenulum. Has thick labial frenulum and small mouth. Baby doesn't open wide for latching. Fitted #20 NS. Baby wouldn't be able to latch onto a #24 NS. Baby needs top and bottom lip flanged.  Taught "C" hold to firm breast for latching. Breast needs firmed on both sides. Encouraged FOB to hold opposite side of breast than mom.  Baby needed much stimulation at the breast for feeding.  Then baby became interested in feeding but fussy. Encouraged mom to pump then hand express give baby at least 5 ml colostrum. He's a big baby starting to appear a little jaundice and really hasn't been feeding well at the breast. Mom has been giving approx 3 ml colostrum in spoon. Discussed Donor milk if interested.   Gave FOB baby d/t baby crying at the breast wouldn't feed. Mom started using DEBP. Gave mom supplemental information sheet.  Patient Name: Julie Webb [id number] Date: 11/24/2019 Reason for consult: Mother's request;Difficult latch;Primapara;Term   Maternal Data    Feeding Feeding Type: Breast Fed  LATCH Score Latch: Repeated attempts needed to sustain latch, nipple held in mouth throughout feeding, stimulation needed to elicit sucking reflex.  Audible Swallowing: None  Type of Nipple: Everted at rest and after stimulation(short shaft)  Comfort (Breast/Nipple): Soft / non-tender  Hold (Positioning): Full assist, staff holds infant at breast  LATCH Score: 5  Interventions Interventions: Breast feeding basics reviewed;Support pillows;Assisted with latch;Position options;Skin to skin;Breast massage;Hand express;Shells;Pre-pump if needed;DEBP;Breast compression;Adjust position  Lactation Tools  Discussed/Used Tools: Shells;Pump;Nipple Shields Nipple shield size: 20 Shell Type: Inverted Breast pump type: Double-Electric Breast Pump   Consult Status Consult Status: Follow-up Date: 11/25/19 Follow-up type: In-patient    Theodoro Kalata 11/24/2019, 11:09 PM

## 2019-11-24 NOTE — Anesthesia Postprocedure Evaluation (Signed)
Anesthesia Post Note  [name]  Procedure(s) Performed: AN AD HOC LABOR EPIDURAL     Patient location during evaluation: Mother Baby Anesthesia Type: Epidural Level of consciousness: awake, awake and alert and oriented Pain management: pain level controlled Vital Signs Assessment: post-procedure vital signs reviewed and stable Respiratory status: spontaneous breathing and respiratory function stable Cardiovascular status: blood pressure returned to baseline Postop Assessment: no headache, epidural receding, patient able to bend at knees, adequate PO intake, no backache, no apparent nausea or vomiting and able to ambulate Anesthetic complications: no    Last Vitals:  Vitals:   11/23/19 2341 11/24/19 0348  BP: 110/70 105/69  Pulse: 80 73  Resp: 18 18  Temp: 36.8 C 36.8 C  SpO2: 100% 100%    Last Pain:  Vitals:   11/24/19 0348  TempSrc: Oral  PainSc: 0-No pain   Pain Goal:                   [name]

## 2019-11-24 NOTE — Progress Notes (Signed)
MOB was referred for history of depression/anxiety.  * Referral screened out by Clinical Social Worker because none of the following criteria appear to apply:  ~ History of anxiety/depression during this pregnancy, or of post-partum depression following prior delivery. ~ Diagnosis of anxiety and/or depression within last 3 years OR * MOB's symptoms currently being treated with medication and/or therapy. MOB currently taking Cymbalta. No concerns noted in [facility] records.  Please contact the Clinical Social Worker if needs arise, by [facility] request, or if MOB scores greater than 9/yes to question 10 on [place] Postpartum Depression Screen.  Julie Miles, LCSW [facility] [employer] [phone]

## 2019-11-24 NOTE — Progress Notes (Signed)
Post Partum Day 1 Subjective: no complaints, up ad lib, voiding and tolerating PO  Objective: Blood pressure 99/62, pulse 66, temperature 98.1 F (36.7 C), temperature source Oral, resp. rate 16, height 5\' 4"  (1.626 m), weight 77.7 kg, last menstrual period 02/23/2019, SpO2 99 %, unknown if currently breastfeeding.  Physical Exam:  General: alert, cooperative and appears stated age 29: appropriate Uterine Fundus: firm Incision: healing well DVT Evaluation: No evidence of DVT seen on physical exam.  Recent Labs    11/23/19 0037 11/24/19 0606  HGB 13.5 11.3*  HCT 40.0 33.7*    Assessment/Plan: Plan for discharge tomorrow, Breastfeeding and Circumcision prior to discharge   LOS: 1 day   Julie Webb J 11/24/2019, 10:56 AM

## 2019-11-24 NOTE — Lactation Note (Signed)
This note was copied from a baby's chart. Lactation Consultation Note Baby 4 hrs old at time of consult.  Mom states baby had one good feeding  Since birth but hasn't been interested since. LGA baby sleepy. Mom had baby in cradle position.  Mom has short shaft nipples. Everts w/stimulation but compresses flat. Needs t-cup hold to keep everted for latching. In football hold attempted latching. Baby not interested.  Drops of colostrum collected and spoon fed to baby. This stimulated baby to cue. Latched onto breast w/t-cup hold and mom sandwhich compression. Baby suckled off and on for  5 minutes. Not aggressive.  Shells given to mom to wear in am w/bra.  Hand pump given to mom to pre-pump to evert before latching. This is very helpful. Mom shown how to use DEBP & how to disassemble, clean, & reassemble parts. Mom knows to pump q3h for 15-20 min. Mom encouraged to feed baby 8-12 times/24 hours and with feeding cues. Mom encouraged to waken baby for feeding if hasn't cued in 3 hrs.  Newborn behavior, STS, I&O, breast massage, milk storage, feeding habits, supply and demand discussed. Mom is [employer]. LC gave mom choices of employee DEBPs to choose from.  Encouraged mom to call for assistance or questions. Lactation brochure given, If mom pre-pumps and wears shells hopefully nipples will evert enough so mom will not have to use NS. LC feels at this time, but baby isn't aggressive, mom may need NS. Breast very compressible at this time.  Patient Name: Julie Webb [id number] Date: 11/24/2019 Reason for consult: Initial assessment;Primapara;Term   Maternal Data Has patient been taught Hand Expression?: Yes Does the patient have breastfeeding experience prior to this delivery?: No  Feeding Feeding Type: Breast Milk  LATCH Score Latch: Repeated attempts needed to sustain latch, nipple held in mouth throughout feeding, stimulation needed to elicit sucking reflex.  Audible Swallowing:  None  Type of Nipple: Everted at rest and after stimulation(short shaft)  Comfort (Breast/Nipple): Soft / non-tender  Hold (Positioning): Full assist, staff holds infant at breast  LATCH Score: 5  Interventions Interventions: Breast feeding basics reviewed;Support pillows;Assisted with latch;Position options;Skin to skin;Expressed milk;Breast massage;Hand express;Shells;Pre-pump if needed;Breast compression;Adjust position;DEBP;Hand pump  Lactation Tools Discussed/Used Tools: Shells;Pump Shell Type: Inverted Breast pump type: Double-Electric Breast Pump;Manual WIC Program: No Pump Review: Setup, frequency, and cleaning;Milk Storage Initiated by:: Allayne Stack RN IBCLC Date initiated:: 11/24/19   Consult Status Consult Status: Follow-up Date: 11/24/19(in pm) Follow-up type: In-patient    Cheyane Ayon, Elta [place] 11/24/2019, 1:12 AM

## 2019-11-24 NOTE — Lactation Note (Signed)
This note was copied from a baby's chart. Lactation Consultation Note  Patient Name: Julie Webb [id number] Date: 11/24/2019 Reason for consult: Follow-up assessment;Primapara;1st time breastfeeding;Term;Other (Comment);Infant weight loss(per mom was circ'd today and was sleepy afterwards)  Baby is 30 hours old  LC reviewed and updated the doc flow sheets per mom.  Baby last fed at 1800 for 10 mins.  LC explained since the baby took and lengthy break today after circ  Probably will cluster feed tonight to make up for the break from feeding.  Mom undecided about her benefits DEBP. LC recommended checking out  [employer] for reviews and details about each pump.     Maternal Data Has patient been taught Hand Expression?: Yes  Feeding Feeding Type: (per mom baby last fed at 6 pm for 10 mins)  LATCH Score                   Interventions Interventions: Breast feeding basics reviewed  Lactation Tools Discussed/Used     Consult Status Consult Status: Follow-up Date: 11/25/19 Follow-up type: [place] 11/24/2019, 7:06 PM

## 2019-11-25 NOTE — Progress Notes (Signed)
Post Partum Day 2 Subjective: no complaints, up ad lib, voiding and tolerating PO  Reviewed Depression, has depression/ anxiety, reviewed warning s/s, has a Psychiatrist she works closely with and will call to see if dose of Cymbalta needs changing,   Objective: Blood pressure 120/85, pulse 71, temperature 97.7 F (36.5 C), temperature source Oral, resp. rate 18, height 5\' 4"  (1.626 m), weight 77.7 kg, last menstrual period 02/23/2019, SpO2 99 %, unknown if currently breastfeeding.  Physical Exam:  General: alert, cooperative and appears stated age 29: appropriate Uterine Fundus: firm Incision: healing well DVT Evaluation: No evidence of DVT seen on physical exam.  Recent Labs    11/23/19 0037 11/24/19 0606  HGB 13.5 11.3*  HCT 40.0 33.7*    Assessment/Plan: Discharge today PP care and precautions/ restrictions and PP depression/anxiety warning s/s reviewed, cont Cymbalta, does not need prescription  Cont PNvit, Tylenol prn pain.  F/up with Dr Ronita Hipps in office in 6 weeks    LOS: 2 days   Julie Webb 11/25/2019, 10:11 AM

## 2019-11-25 NOTE — Lactation Note (Signed)
This note was copied from a baby's chart. Lactation Consultation Note  Patient Name: Julie Webb [id number] Date: 11/25/2019 Reason for consult: Follow-up assessment  P1 mother whose infant is now 81 hours old.  This is a term baby.  Family has been discharged.  Mother has had some difficulty with latching.  She has been using breast shells and has a manual pump.  Mother has also been provided with a #20 NS to use as needed.  Mother began supplementing last night with donor breast milk.  Baby was asleep in mother's arms when I arrived.  Reviewed feeding basics.  Mother has started pumping with the DEBP and is beginning to see colostrum drops.  She has been feeding drops back to baby.  Per MD note, parents are to supplement after discharge with 15-30 mls of EBM/formula after every feeding.  Mother stated she has multiple formula samples at home and will use these for supplementation.  I enocuraged her to awaken him every three hours and to breast feed first prior to any supplementation; suggested father provide the supplementation while mother immediately pump with the DEBP after breast feeding for 15 minutes.  She will incorporate hand expression before/after pumping to help with milk supply.  Family has been using the spoon/curved tip syringe for supplementation.  I brought in and demonstrated the feeding cup and both parents are excited to try this method.  I explained how it will be easier to get the higher volumes into baby via this method as opposed to the spoon.    Provided praise for mother's efforts as she suggested she has felt "defeated."  Explained about milk coming to volume and that breast feeding/pumping takes patience and practice.  Encouraged her to be diligent about her efforts and to call us for any questions/concerns she may have.  She has our phone number and also the OP [place] phone number if she wishes to make a private consultation visit.  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  [place] DEBP given.   Insurance cared copied and form placed in folder in cabinet.  RN provided discharge instructions.  I suggested mother finish breakfast, feed baby and pump one more time before leaving the hospital.  She would like to do this.  Parents will call as needed for any further concerns.     Maternal Data    Feeding    LATCH Score                   Interventions    Lactation Tools Discussed/Used     Consult Status Consult Status: Complete Date: 11/26/19 Follow-up type: In-patient    Sparsh Callens R Etrulia Zarr 11/25/2019, 10:47 AM

## 2019-11-25 NOTE — Discharge Summary (Signed)
Obstetric Discharge Summary Reason for Admission: induction of labor 39 wks for rib pain and deformity. Depression getting worse due to limitations in mobility due to rib pain Prenatal Procedures: ultrasound and routine PNcare Intrapartum Procedures: spontaneous vaginal delivery Postpartum Procedures: none Complications-Operative and Postpartum: 2nd degree perineal laceration                                                                             H/o Depression, on medication- Cymbalta and will continue   CBC Latest Ref Rng & Units 11/24/2019 11/23/2019 06/05/2019  WBC 4.0 - 10.5 K/uL 12.4(H) 8.3 9.3  Hemoglobin 12.0 - 15.0 g/dL 11.3(L) 13.5 13.7  Hematocrit 36.0 - 46.0 % 33.7(L) 40.0 40.0  Platelets 150 - 400 K/uL 187 218 216   Physical Exam:  General: alert and cooperative Lochia: appropriate Uterine Fundus: firm Incision: healing well DVT Evaluation: No evidence of DVT seen on physical exam.  Discharge Diagnoses: Term Pregnancy-delivered  Discharge Information: Date: 11/25/2019 Activity: pelvic rest Diet: routine Medications: PNvitamins, Tylenol, Cymbalta  Condition: stable Instructions: refer to practice specific booklet Discharge to: home Follow-up Information    Brien Few, MD Follow up in 6 week(s).   Specialty: Obstetrics and Gynecology Contact information: [address] [place] [address] [phone]           Newborn Data: Live born female  Birth Weight: 9 lb 6.4 oz (4264 g) APGAR: 28, 9  Newborn Delivery   Birth date/time: 11/23/2019 20:00:00 Delivery type: Vaginal, Spontaneous      Home with mother.  Elveria Royals 11/25/2019, 10:05 AM

## 2019-11-25 NOTE — Lactation Note (Signed)
This note was copied from a baby's chart. Lactation Consultation Note Mom called out for Donor milk. Baby will not stop crying. Mom pumped and hand expressed getting drops. Mom signed Donor consent. Milk storage for colostrum and Donor milk discussed.  Demonstrated how to syring and finger feed. FOB held baby upright, mom fed baby. Baby suckled well. Baby really has a hard time flanging lips upper and lower. Needs manually flanged even w/finger feeding.  Patient Name: Julie Webb [id number] Date: 11/25/2019 Reason for consult: Mother's request;Primapara   Maternal Data    Feeding Feeding Type: Donor Breast Milk  LATCH Score Latch: Repeated attempts needed to sustain latch, nipple held in mouth throughout feeding, stimulation needed to elicit sucking reflex.  Audible Swallowing: None  Type of Nipple: Everted at rest and after stimulation(short shaft)  Comfort (Breast/Nipple): Soft / non-tender  Hold (Positioning): Full assist, staff holds infant at breast  LATCH Score: 5  Interventions Interventions: Breast feeding basics reviewed;Support pillows;Assisted with latch;Position options;Skin to skin;Breast massage;Hand express;Shells;Pre-pump if needed;DEBP;Breast compression;Adjust position  Lactation Tools Discussed/Used Tools: Pump Nipple shield size: 20 Shell Type: Inverted Breast pump type: Double-Electric Breast Pump   Consult Status Consult Status: Follow-up Date: 11/25/19 Follow-up type: In-patient    Theodoro Kalata 11/25/2019, 12:18 AM

## 2019-11-25 NOTE — Discharge Instructions (Signed)

## 2019-11-29 ENCOUNTER — Ambulatory Visit: Payer: No Typology Code available for payment source | Admitting: Physical Therapy

## 2019-12-02 ENCOUNTER — Encounter: Payer: Self-pay | Admitting: Family Medicine

## 2019-12-05 ENCOUNTER — Ambulatory Visit (INDEPENDENT_AMBULATORY_CARE_PROVIDER_SITE_OTHER): Payer: No Typology Code available for payment source | Admitting: Psychiatry

## 2019-12-05 ENCOUNTER — Encounter: Payer: Self-pay | Admitting: Psychiatry

## 2019-12-05 DIAGNOSIS — F411 Generalized anxiety disorder: Secondary | ICD-10-CM | POA: Diagnosis not present

## 2019-12-05 DIAGNOSIS — F410 Panic disorder [episodic paroxysmal anxiety] without agoraphobia: Secondary | ICD-10-CM

## 2019-12-05 DIAGNOSIS — F330 Major depressive disorder, recurrent, mild: Secondary | ICD-10-CM

## 2019-12-05 DIAGNOSIS — F3341 Major depressive disorder, recurrent, in partial remission: Secondary | ICD-10-CM

## 2019-12-05 DIAGNOSIS — F4010 Social phobia, unspecified: Secondary | ICD-10-CM | POA: Diagnosis not present

## 2019-12-05 MED ORDER — DULOXETINE HCL 60 MG PO CPEP: 60.0000 mg | ORAL_CAPSULE | Freq: Every day | ORAL | 1 refills | Status: DC

## 2019-12-05 MED FILL — DULoxetine HCL 60 MG CPEP: 60 | 90 days supply | Qty: 90 | Fill #0

## 2019-12-05 NOTE — Progress Notes (Signed)
[facility]  Patient ID: Julie Webb,  MRN: [id number]  PCP: Debbrah Alar, NP  Date of Evaluation: 12/05/2019 Time spent:20 minutes from 1000 to 1020  Chief Complaint:  Chief Complaint    Depression; Anxiety; Panic Attack      HISTORY/CURRENT STATUS: Julie Webb is provided telemedicine audiovisual appointment session, though she declines the videocamera due to being 12 days postpartum breast-feeding with privacy with the baby so audio only, phone to phone 20 minutes with consent with epic collateral for psychiatric interview and exam in 4.31-month evaluation and management of major depression and social/generalized/panic anxiety.  The patient had treatment for rib pain somewhat postural in the past that became much worse with advanced abdominal size in third term of pregnancy so that she was induced and delivered at 39 weeks of 9 pound 6 ounce boy [name] all doing well.  [name] has some difficulty latching for breast-feeding with redundant flexing upper lip that is otherwise totally normal so patient is pumping and supplementing with bottle as needed.  Shae is exhausted postpartum and was considered more depressed preceding delivery by obstetrician based in her response to the rib pain.  Though she has less pain postpartum and is otherwise doing well, she is very tired stating she has a lot of stress still feeling low in energy and motivation as though depressed.  She has no postpartum psychosis or definite postpartum depression though she is vulnerable to such.  She has continued her Cymbalta 60 mg every bedtime throughout pregnancy and now postpartum.  She does have some crying spells and mood swings but largely is very pleased with husband being happy and holding the baby.  [place] registry is negative.  We process option of adding Wellbutrin even 150 mg XL previously taking 300 mg  if needed for her depression symptoms though these are mild currently and largely situational due to  stress and physical discomfort.  She is clear minded as we discussed all of these issues and expects she will have to go back to work at 6 weeks postpartum instead of taking the extended time offered as finances are difficult.  In that way she prefers to schedule next appointment in 6 months unless needed sooner.  She has no mania, psychosis, suicidality or delirium.  Depression The patient presents withdepressionasa recurrentproblemwithcurrent episode startingmore than 10 months ago. The onset quality is sudden. The problem occurs intermittently.The problemis now partially remittedsince onset.Associated symptoms include decreased concentration,fatigue,anergia, and body aches. Associated symptoms include no decreased interest, no hopelessness, no suicidality, no psychosis,and not irritable.The symptoms are aggravated by work stress, medication, social issues and family issues.Past treatments includeSSRIs, other medications, and psychotherapy.Compliance with treatment ispoor tovariable.Past compliance problems includesocial anxiety,medical issues, difficulty with treatment plan and medication issues.Previous treatment provided moderaterelief.Risk factors include family history, a change in medication usage/dosage, the patient not taking medications correctly, emotional abuse, prior traumatic experience, prior psychiatric admission, major life event, family history of mental illness, history of mental illness, history of self-injury and history of suicide attempt. Past medical history includes thyroid problem,recent illness,anxiety,depression,mental health disorder,schizophreniaand suicide attempts. Pertinent negatives include no chronic pain,no terminal illness,no bipolar disorder,no eating disorder,no obsessive-compulsive disorder,no post-traumatic stress disorderand no head trauma.  Individual Medical History/ Review of Systems: Changes? :Yes  successful outcome to pregnancy now breast-feeding.  Allergies: Ibuprofen and Phenergan [promethazine hcl]  Current Medications:  Current Outpatient Medications:  .  acetaminophen (TYLENOL) 500 MG tablet, Take 500 mg by mouth every 6 (six) hours as needed for mild pain.,  Disp: , Rfl:  .  aspirin 81 MG chewable tablet, Chew by mouth daily., Disp: , Rfl:  .  butalbital-acetaminophen-caffeine (FIORICET, ESGIC) 50-325-40 MG tablet, Take 1 tablet by mouth every 6 (six) hours as needed for headache., Disp: 12 tablet, Rfl: 5 .  DULoxetine (CYMBALTA) 60 MG capsule, Take 1 capsule (60 mg total) by mouth at bedtime., Disp: 90 capsule, Rfl: 1 .  Prenat w/o A-FeCbn-DSS-FA-DHA (PRENAISSANCE PLUS) 28-1-250 MG CAPS, Take 1 capsule by mouth daily., Disp: , Rfl:  .  promethazine (PHENERGAN) 12.5 MG tablet, Take 2 tablets (25 mg total) by mouth every 6 (six) hours as needed for nausea or vomiting., Disp: 40 tablet, Rfl: 3  Medication Side Effects: none  Family Medical/ Social History: Changes? No  MENTAL HEALTH EXAM:  Last menstrual period 02/23/2019, currently breastfeeding.There is no height or weight on file to calculate BMI.  As not present in office today  General Appearance: N/A  Eye Contact:  N/A  Speech:  Clear and Coherent, Normal Rate and Talkative  Volume:  Decreased  Mood:  Anxious, Dysphoric and Euthymic  Affect:  Congruent, Full Range and Anxious  Thought Process:  Coherent, Goal Directed, Irrelevant and Descriptions of Associations: Circumstantial  Orientation:  Full (Time, Place, and Person)  Thought Content: Obsessions and Rumination   Suicidal Thoughts:  No  Homicidal Thoughts:  No  Memory:  Immediate;   Good Remote;   Good  Judgement:  Good  Insight:  Fair  Psychomotor Activity:  N/A  Concentration:  Concentration: Fair and Attention Span: Good  Recall:  [place] of Knowledge: Good  Language: Good  Assets:  Desire for Improvement Resilience Talents/Skills  ADL's:  Intact   Cognition: WNL  Prognosis:  Good    DIAGNOSES:    ICD-10-CM   1. Mild recurrent major depression (HCC)  F33.0 DULoxetine (CYMBALTA) 60 MG capsule  2. Social anxiety disorder  F40.10 DULoxetine (CYMBALTA) 60 MG capsule  3. Generalized anxiety disorder  F41.1 DULoxetine (CYMBALTA) 60 MG capsule  4. Panic disorder  F41.0 DULoxetine (CYMBALTA) 60 MG capsule  5. Recurrent major depression in partial remission ([place])  F33.41     Receiving Psychotherapy: No though previously seeing Lanetta Inch, [facility]   RECOMMENDATIONS: Over 50% of the 20-minute face-to-face session for a total of 10 minutes counseling and coordination of care is spent educating patient on postpartum depression and psychosis integrated with education on her previous depression and anxiety disorders assessing current status in every way possible by telemedicine and integrating ongoing care with possible need for urgent interim care if any exacerbation.  Labor induction was said to be for depression from the physical rib pain which is chronic treated in the past but made worse with pregnancy all now improved.  The patient expects her crying spells, fatigue, and anergia in the immediate postpartum tocontinue to improve as husband and baby are doing well and she looks forward to returning to work after 6 weeks off for financial reasons even though she has more time available if needed.  She is E scribed Cymbalta 60 mg every bedtime #90 with 1 refill sent to [facility] for depression and anxieties.  We discussed starting Wellbutrin 150 mg XL if any further depression arises interfering with recovery pattern postpartum in daily activities and to have immediate appointment for any severe depression or early psychotic symptoms of which there are none at this time and none predicted.  She otherwise plans to return in 6 months for follow  up or sooner if needed.  Virtual Visit via Video Note  I connected with [place] on  12/05/19 at 11:00 AM EST by a video enabled telemedicine application and verified that I am speaking with the correct person using two identifiers.  Location: Patient: Individually at family residence audio only as lactating with infant nearly 71 weeks of age therefore audio only Provider: [facility] office   I discussed the limitations of evaluation and management by telemedicine and the availability of in person appointments. The patient expressed understanding and agreed to proceed.  History of Present Illness: 4.51-month evaluation and management address major depression and social/generalized/panic anxiety.  The patient had treatment for rib pain somewhat postural in the past that became much worse with advanced abdominal size in third term of pregnancy so that she was induced and delivered at 39 weeks of 9 pound 6 ounce boy [name] all doing well.  [name] has some difficulty latching for breast-feeding with redundant flexing upper lip that is otherwise totally normal so patient is pumping and supplementing with bottle as needed.  Julie Webb is exhausted postpartum and was considered more depressed preceding delivery by obstetrician based in her response to the rib pain.   Observations/Objective: Mood:  Anxious, Dysphoric and Euthymic  Affect:  Congruent, Full Range and Anxious  Thought Process:  Coherent, Goal Directed, Irrelevant and Descriptions of Associations: Circumstantial  Orientation:  Full (Time, Place, and Person)  Thought Content: Obsessions and Rumination    Assessment and Plan: Over 50% of the 20-minute face-to-face session for a total of 10 minutes counseling and coordination of care is spent educating patient on postpartum depression and psychosis integrated with education on her previous depression and anxiety disorders assessing current status in every way possible by telemedicine and integrating ongoing care with possible need for urgent interim care if any  exacerbation.  Labor induction was said to be for depression from the physical rib pain which is chronic treated in the past but made worse with pregnancy all now improved.  The patient expects her crying spells, fatigue, and anergia in the immediate postpartum tocontinue to improve as husband and baby are doing well and she looks forward to returning to work after 6 weeks off for financial reasons even though she has more time available if needed.  She is E scribed Cymbalta 60 mg every bedtime #90 with 1 refill sent to [facility] for depression and anxieties.  We discussed starting Wellbutrin 150 mg XL if any further depression arises interfering with recovery pattern postpartum in daily activities and to have immediate appointment for any severe depression or early psychotic symptoms of which there are none at this time and none predicted.   Follow Up Instructions: She otherwise plans to return in 6 months for follow up or sooner if needed.    I discussed the assessment and treatment plan with the patient. The patient was provided an opportunity to ask questions and all were answered. The patient agreed with the plan and demonstrated an understanding of the instructions.   The patient was advised to call back or seek an in-person evaluation if the symptoms worsen or if the condition fails to improve as anticipated.  I provided 20 minutes of non-face-to-face time during this encounter. [employer] meeting [id number] Meeting password: [email or web address]  Delight Hoh, MD  Delight Hoh, MD

## 2019-12-06 ENCOUNTER — Other Ambulatory Visit: Payer: Self-pay

## 2019-12-06 DIAGNOSIS — R293 Abnormal posture: Secondary | ICD-10-CM

## 2019-12-07 ENCOUNTER — Other Ambulatory Visit: Payer: Self-pay

## 2019-12-07 ENCOUNTER — Ambulatory Visit: Payer: No Typology Code available for payment source | Admitting: Family Medicine

## 2019-12-07 ENCOUNTER — Encounter: Payer: Self-pay | Admitting: Family Medicine

## 2019-12-07 DIAGNOSIS — M999 Biomechanical lesion, unspecified: Secondary | ICD-10-CM | POA: Diagnosis not present

## 2019-12-07 DIAGNOSIS — R0781 Pleurodynia: Secondary | ICD-10-CM | POA: Diagnosis not present

## 2019-12-07 NOTE — Progress Notes (Signed)
[facility] [address] [place] Phone: [phone] Subjective:   I Julie Webb am serving as a Education administrator for Dr. Hulan Saas.  This visit occurred during the SARS-CoV-2 public health emergency.  Safety protocols were in place, including screening questions prior to the visit, additional usage of staff PPE, and extensive cleaning of exam room while observing appropriate contact time as indicated for disinfecting solutions.   I'm seeing this patient by the request  of:  Debbrah Alar, NP  CC: Right side pain  [id number]  Julie Webb is a 29 y.o. female coming in with complaint of right side pain. OMT. Patient states she feels better now that she has had the baby.   Patient feels like it is getting better overall.  Patient is concerned that the pain may come back though again at any point and also is very concerned about future pregnancies.  Son is doing well.     Past Medical History:  Diagnosis Date  . Anxiety   . Depression   . Glomerulonephritis, poststreptococcal    age 8  . 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 Migraines   . Vaginal Pap smear, abnormal    Past Surgical History:  Procedure Laterality Date  . NO PAST SURGERIES     Social History   Socioeconomic History  . Marital status: Married    Spouse name: Not on file  . Number of children: Not on file  . Years of education: Not on file  . Highest education level: Not on file  Occupational History  . Not on file  Tobacco Use  . Smoking status: Never Smoker  . Smokeless tobacco: Never Used  Substance and Sexual Activity  . Alcohol use: Not Currently    Comment: occasionally- once a month if then pt reports  . Drug use: No  . Sexual activity: Yes    Birth control/protection: Condom  Other Topics Concern  . Not on file  Social History Narrative   ** Merged History Encounter **       Works as a Marine scientist in Art therapist at [employer] with  Ward up in [place] Enjoys reading Has an Ceres     Social Determinants of Radio broadcast assistant Strain:   . Difficulty of Paying Living Expenses: Not on file  Food Insecurity:   . Worried About Charity fundraiser in the Last Year: Not on file  . Ran Out of Food in the Last Year: Not on file  Transportation Needs:   . Lack of Transportation (Medical): Not on file  . Lack of Transportation (Non-Medical): Not on file  Physical Activity:   . Days of Exercise per Week: Not on file  . Minutes of Exercise per Session: Not on file  Stress:   . Feeling of Stress : Not on file  Social Connections:   . Frequency of Communication with Friends and Family: Not on file  . Frequency of Social Gatherings with Friends and Family: Not on file  . Attends Religious Services: Not on file  . Active Member of Clubs or Organizations: Not on file  . Attends Archivist Meetings: Not on file  . Marital Status: Not on file   Allergies  Allergen Reactions  . Ibuprofen Other (See Comments)    Kidney failure  . Phenergan [Promethazine Hcl] Other (See Comments)    Restlessness w/ IV Phenergen. Probable mild EPS. Tolerates if taken w/  Benadryl.    Family History  Problem Relation Age of Onset  . Hyperlipidemia Mother   . Mental illness Mother   . Fibroids Mother        uterine  . Hypertension Father   . Heart attack Father   . Heart disease Maternal Grandmother   . Hyperlipidemia Maternal Grandmother   . Hypertension Maternal Grandmother   . Heart disease Maternal Grandfather   . Hyperlipidemia Maternal Grandfather   . Hypertension Maternal Grandfather   . Pancreatic cancer Paternal Grandmother   . Mesothelioma Paternal Grandfather       Current Outpatient Medications (Respiratory):  .  promethazine (PHENERGAN) 12.5 MG tablet, Take 2 tablets (25 mg total) by mouth every 6 (six) hours as needed for nausea or vomiting.  Current Outpatient Medications  (Analgesics):  .  acetaminophen (TYLENOL) 500 MG tablet, Take 500 mg by mouth every 6 (six) hours as needed for mild pain. Marland Kitchen  aspirin 81 MG chewable tablet, Chew by mouth daily. .  butalbital-acetaminophen-caffeine (FIORICET, ESGIC) 50-325-40 MG tablet, Take 1 tablet by mouth every 6 (six) hours as needed for headache.   Current Outpatient Medications (Other):  Marland Kitchen  DULoxetine (CYMBALTA) 60 MG capsule, Take 1 capsule (60 mg total) by mouth at bedtime. .  Prenat w/o A-FeCbn-DSS-FA-DHA (PRENAISSANCE PLUS) 28-1-250 MG CAPS, Take 1 capsule by mouth daily.   Reviewed prior external information including notes and imaging from  primary care provider As well as notes that were available from care everywhere and other healthcare systems.  Past medical history, social, surgical and family history all reviewed in electronic medical record.  No pertanent information unless stated regarding to the chief complaint.   Review of Systems:  No headache, visual changes, nausea, vomiting, diarrhea, constipation, dizziness, abdominal pain, skin rash, fevers, chills, night sweats, weight loss, swollen lymph nodes, body aches, joint swelling, chest pain, shortness of breath, mood changes. POSITIVE muscle aches  Objective  Blood pressure 100/80, pulse 76, height 5\' 4"  (1.626 m), weight 148 lb (67.1 kg), last menstrual period 02/23/2019, SpO2 97 %, currently breastfeeding.   General: No apparent distress alert and oriented x3 mood and affect normal, dressed appropriately.  HEENT: Pupils equal, extraocular movements intact  Respiratory: Patient's speak in full sentences and does not appear short of breath  Cardiovascular: No lower extremity edema, non tender, no erythema  Skin: Warm dry intact with no signs of infection or rash on extremities or on axial skeleton.  Abdomen: Soft nontender  Neuro: Cranial nerves II through XII are intact, neurovascularly intact in all extremities with 2+ DTRs and 2+ pulses.   Lymph: No lymphadenopathy of posterior or anterior cervical chain or axillae bilaterally.  Gait normal with good balance and coordination.  MSK:  Non tender with full range of motion and good stability and symmetric strength and tone of shoulders, elbows, wrist, hip, knee and ankles bilaterally.  Patient has some very mild scapular dyskinesis on the right side.  Tender to palpation in the parascapular region.  Patient has better inhalation.  Not as tender over the rib area as much. Low back exam does show some mild increase in tightness noted.  Osteopathic findings  C6 flexed rotated and side bent left T6 extended rotated and side bent right inhaled rib T9 extended rotated and side bent left    Impression and Recommendations:      The above documentation has been reviewed and is accurate and complete Lyndal Pulley, DO       Note:  This dictation was prepared with Dragon dictation along with smaller phrase technology. Any transcriptional errors that result from this process are unintentional.

## 2019-12-07 NOTE — Patient Instructions (Signed)
Good to see you Congrats!!! See me again in 6 weeks

## 2019-12-08 ENCOUNTER — Encounter: Payer: Self-pay | Admitting: Family Medicine

## 2019-12-08 DIAGNOSIS — M999 Biomechanical lesion, unspecified: Secondary | ICD-10-CM | POA: Insufficient documentation

## 2019-12-08 NOTE — Assessment & Plan Note (Signed)
Significant provement status post pregnancy at the moment.  Discussed icing regimen and home exercise, discussed avoiding certain activities.  Increase activity slowly.  Follow-up again in 4 to 8 weeks.  I believe the patient will do relatively well overall but could still continue to have some chronic irritation that does have some mild exacerbations from time to time.  Discussed with patient about future pregnancies and increasing osteopathic manipulation after the first trimester.  Patient will follow-up again in 8 weeks

## 2019-12-08 NOTE — Assessment & Plan Note (Signed)
Decision today to treat with OMT was based on Physical Exam  After verbal consent patient was treated with HVLA, ME, FPR techniques in cervical, thoracic, rib, l areas  Patient tolerated the procedure well with improvement in symptoms  Patient given exercises, stretches and lifestyle modifications  See medications in patient instructions if given  Patient will follow up in 8 weeks

## 2019-12-21 ENCOUNTER — Ambulatory Visit: Payer: No Typology Code available for payment source | Admitting: Family Medicine

## 2019-12-22 ENCOUNTER — Ambulatory Visit: Payer: No Typology Code available for payment source | Admitting: Physical Therapy

## 2019-12-29 ENCOUNTER — Other Ambulatory Visit: Payer: Self-pay

## 2019-12-29 ENCOUNTER — Ambulatory Visit: Payer: No Typology Code available for payment source | Attending: Family Medicine | Admitting: Physical Therapy

## 2019-12-29 DIAGNOSIS — M6281 Muscle weakness (generalized): Secondary | ICD-10-CM | POA: Insufficient documentation

## 2019-12-29 DIAGNOSIS — M25611 Stiffness of right shoulder, not elsewhere classified: Secondary | ICD-10-CM | POA: Diagnosis present

## 2019-12-29 DIAGNOSIS — R252 Cramp and spasm: Secondary | ICD-10-CM | POA: Insufficient documentation

## 2019-12-29 DIAGNOSIS — R293 Abnormal posture: Secondary | ICD-10-CM | POA: Diagnosis not present

## 2019-12-29 NOTE — Therapy (Signed)
[place], [place], [address] Phone: [phone]   Fax:  [phone]  Physical Therapy Evaluation  Patient Details  Name: Julie Webb MRN: [id number] Date of Birth: [birth year] Referring Provider (PT): Lyndal Pulley DO   Encounter Date: 12/29/2019  PT End of Session - 12/29/19 1712    Visit Number  1    Number of Visits  9    Date for PT Re-Evaluation  02/23/20    Authorization Type  [facility]    PT Start Time  [id number]    PT Stop Time  1646    PT Time Calculation (min)  59 min    Activity Tolerance  Patient tolerated treatment well    Behavior During Therapy  [facility] for tasks assessed/performed;Anxious       Past Medical History:  Diagnosis Date  . Anxiety   . Depression   . Glomerulonephritis, poststreptococcal    age 15  . 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 Migraines   . Vaginal Pap smear, abnormal     Past Surgical History:  Procedure Laterality Date  . NO PAST SURGERIES      There were no vitals filed for this visit.   Subjective Assessment - 12/29/19 1601    Subjective  I had baby [name] on November 23, 2019.  I had less rib pain than when I was pregnant but it is as hight as 3/10 and today it is 1/10.  I    Pertinent History  post partum 5 weeks on 12-29-19    How long can you sit comfortably?  able to breast feed comfortably with pillow and positioning    How long can you stand comfortably?  2-3 minutes with rib pain and holding baby    Diagnostic tests  x ray    Patient Stated Goals  Get to normal painfree function and to get stronger    Currently in Pain?  Yes    Pain Score  1    can be as high as 3/10   Pain Location  Rib cage    Pain Orientation  Right    Pain Descriptors / Indicators  Sharp;Spasm    Pain Type  Chronic pain    Pain Radiating Towards  spread from anterior to around back and spine Ribs 5, 6, 7    Pain Onset  More than a month ago    Pain Frequency   Intermittent    Aggravating Factors   better since delivery of baby    Pain Relieving Factors  STM         [facility] PT Assessment - 12/29/19 0001      Assessment   Medical Diagnosis  Rt rib pain    Referring Provider (PT)  Lyndal Pulley DO    Onset Date/Surgical Date  02/28/19   approximately   Hand Dominance  Right    Prior Therapy  prior to pregnancy delivery      Precautions   Precaution Comments  post partum 5 weeks      Restrictions   Weight Bearing Restrictions  No      Balance Screen   Has the patient fallen in the past 6 months  No    Has the patient had a decrease in activity level because of a fear of falling?   No    Is the patient reluctant to leave their home because of a fear of falling?   No  Prior Function   Level of Independence  Independent    Vocation  Full time employment    Vocation Requirements  ICU nurse      Cognition   Overall Cognitive Status  Within Functional Limits for tasks assessed      Observation/Other Assessments   Focus on Therapeutic Outcomes (FOTO)   not taken      Sensation   Additional Comments  WFL      Functional Tests   Functional tests  Other      Other:   Other/ Comments  prone plank on elbows  14 sec with visible co-contraction but no more than 14sec - challenging      Posture/Postural Control   Posture Comments  Rt T10 rib depression/Lt elevation; longitudinal rotation Rt pelvis anterior. Marked depression on Right before pregnancy      AROM   Overall AROM   Deficits    Right Shoulder Flexion  155 Degrees   no pain   Left Shoulder Flexion  167 Degrees    Right Hip Flexion  120    Left Hip Flexion  120      Strength   Overall Strength  Within functional limits for tasks performed    Overall Strength Comments  abdominal 3/5 post partum 5 weeks generally deconditioned due to pregnanacy and gauding due to rib pain    Right Hip Flexion  4+/5    Right Hip Extension  4+/5    Right Hip ABduction  4+/5    Left  Hip Flexion  4+/5    Left Hip Extension  4+/5    Left Hip ABduction  4+/5      Palpation   Palpation comment  IR of Rt lower ribs  with improved mobility                Objective measurements completed on examination: See above findings.      [facility] Adult PT Treatment/Exercise - 12/29/19 0001      Modalities   Modalities  Moist Heat      Moist Heat Therapy   Number Minutes Moist Heat  12 Minutes    Moist Heat Location  Other (comment)   RT ribs      Manual Therapy   Manual Therapy  Soft tissue mobilization;Joint mobilization    Manual therapy comments  approximation of ribs with cues for breathing for gentle jt mobs of ribs, skilled PT for palpation with TPDN    Joint Mobilization  Rt rib mobs from Lateral, post and anterior- pressure with breathing    Soft tissue mobilization  intercostals thoracic        Trigger Point Dry Needling - 12/29/19 0001    Consent Given?  Yes    Education Handout Provided  Previously provided    Muscles Treated Back/Hip  Thoracic multifidi   over ribs as needed   Dry Needling Comments  anterior 5th and 6th rib  multiple needlings over ribs 5/6 from post to anterior over trigger points over bone    Other Dry Needling  .30 gage 18mm  60 mm needle for multifidus of back.    Thoracic multifidi response  Twitch response elicited;Palpable increased muscle length   Lt sidelying Ribs RT T-4 to T-9          PT Education - 12/29/19 1713    Education Details  POC Explanation of findings TPDN importance of strengthening core and posterior chain    Person(s) Educated  Patient  Methods  Explanation    Comprehension  Verbalized understanding       PT Short Term Goals - 12/29/19 1640      PT SHORT TERM GOAL #1   Title  Pt will be able to perform intial HEP with full AROM of shoulders with pain 3/10 or less    Baseline  at worst 3/10 pain in RT ribs , today 1/10    Time  3    Period  Weeks    Status  New    Target Date  01/19/20       PT SHORT TERM GOAL #2   Title  Pt will be able to perform prone plank on elbows for 30 seconds with steady control to show improved core strength    Baseline  14 sec with visible co contraction    Time  3    Period  Weeks    Status  New    Target Date  01/19/20        PT Long Term Goals - 12/29/19 1608      PT LONG TERM GOAL #1   Title  Pt will be independent with advanced HEP for core and posterior chain to improve posture and deconditioning post partum    Time  8    Period  Weeks    Status  New    Target Date  02/23/20      PT LONG TERM GOAL #2   Title  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    Time  8    Period  Weeks    Status  New    Target Date  02/23/20      PT LONG TERM GOAL #3   Title  Pt will be able to ambulate for at least 30 min without rest break    Time  8    Target Date  02/23/20      PT LONG TERM GOAL #4   Title  Pt will be able to sleep at night for at least 5 of 7 nights without waking due to pain    Time  8    Target Date  02/23/20      PT LONG TERM GOAL #5   Title  Pt will be able to prone plank on elbows for 90 seconds to show improved core strength    Baseline  14 sec on eval    Time  8    Period  Weeks    Status  New    Target Date  02/23/20             Plan - 12/29/19 1559    Clinical Impression Statement  Ms Thorson delivered a baby boy 11-23-19 and is 5 weeks post partum having been seen for RT rib dysfunction/pain prior to delivery and has abnormal posture.  Ms Ryberg returns today with 1/10 pain but does have pain up to 3/10 which is better than previous pain level while pregnant.  She is deconditioned and would like to return at a higher level of function and strength in order  to return to work as Warden/ranger.  she has troubel standing of rmore than 2-3 minutes due to RT rib pain though it better tolerated than during pregnancy. Ms Ocegueda will benefit from 1 x a weekskilled PT to address  impairments/deconditioning to return to work full time and care for infant son  Personal Factors and Comorbidities  Comorbidity 1    Comorbidities  rib dysfunction,post partum 5 week    Examination-Activity Limitations  Bathing;Locomotion Level;Transfers;Bed Mobility;Reach Overhead;Bend;Sit;Sleep;Dressing;Stand;Lift    Examination-Participation Restrictions  Meal Prep;Cleaning;Community Activity;Driving    Stability/Clinical Decision Making  Stable/Uncomplicated    Clinical Decision Making  Low    Rehab Potential  Good    PT Frequency  1x / week    PT Duration  8 weeks    PT Treatment/Interventions  ADLs/Self Care Home Management;Cryotherapy;Moist Heat;Functional mobility training;Therapeutic activities;Therapeutic exercise;Patient/family education;Neuromuscular re-education;Manual techniques;Passive range of motion;Taping;Dry needling    PT Next Visit Plan  continue 1 x a week for TPDN and strengthening for deconditioning post partum, Assess TPDN from last visit.  Do Abdominal strength post partum HEP    Consulted and Agree with Plan of Care  Patient       Patient will benefit from skilled therapeutic intervention in order to improve the following deficits and impairments:  Impaired UE functional use, Increased muscle spasms, Decreased activity tolerance, Pain, Improper body mechanics, Postural dysfunction  Visit Diagnosis: Abnormal posture  Cramp and spasm  Muscle weakness (generalized)     Problem List Patient Active Problem List   Diagnosis Date Noted  . Nonallopathic lesion of cervical region 12/08/2019  . Nonallopathic lesion of thoracic region 12/08/2019  . Postpartum care following vaginal delivery (2/3) 11/24/2019  . SVD (spontaneous vaginal delivery) 11/24/2019  . Second degree perineal laceration 11/24/2019  . Encounter for planned induction of labor 11/23/2019  . Nonallopathic lesion of rib cage 10/08/2019  . Rib pain on right side 10/07/2019  . Herpes simplex  type 2 infection 02/23/2019  . Panic disorder 11/01/2018  . Generalized anxiety disorder 11/01/2018  . Chronic migraine 12/25/2017  . Suicidal behavior with attempted self-injury ([place])   . 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 Tremor 10/05/2015  . Loss of weight 07/06/2015  . Social anxiety disorder 05/30/2015  . H/O acute post-streptococcal glomerulonephritis 05/30/2015  . Contraceptive management 05/30/2015  . Migraine 05/30/2015    Voncille Lo, PT Certified Exercise Expert for the Aging Adult  12/29/19 5:16 PM Phone: [phone] Fax: [facility] [address] [place], [place], [address] Phone: [phone]   Fax:  [phone]  Name: Julie Webb MRN: [id number] Date of Birth: May 04, 1991

## 2020-01-12 ENCOUNTER — Ambulatory Visit (INDEPENDENT_AMBULATORY_CARE_PROVIDER_SITE_OTHER): Payer: No Typology Code available for payment source | Admitting: Psychiatry

## 2020-01-12 ENCOUNTER — Other Ambulatory Visit: Payer: Self-pay

## 2020-01-12 ENCOUNTER — Encounter: Payer: Self-pay | Admitting: Psychiatry

## 2020-01-12 VITALS — Ht 64.0 in | Wt 150.0 lb

## 2020-01-12 DIAGNOSIS — F411 Generalized anxiety disorder: Secondary | ICD-10-CM | POA: Diagnosis not present

## 2020-01-12 DIAGNOSIS — F332 Major depressive disorder, recurrent severe without psychotic features: Secondary | ICD-10-CM | POA: Diagnosis not present

## 2020-01-12 DIAGNOSIS — F4010 Social phobia, unspecified: Secondary | ICD-10-CM | POA: Diagnosis not present

## 2020-01-12 DIAGNOSIS — F410 Panic disorder [episodic paroxysmal anxiety] without agoraphobia: Secondary | ICD-10-CM | POA: Diagnosis not present

## 2020-01-12 LAB — HM PAP SMEAR: HM Pap smear: NEGATIVE

## 2020-01-12 MED ORDER — LATUDA 20 MG PO TABS: 20.0000 mg | ORAL_TABLET | Freq: Every day | ORAL | 0 refills | Status: DC

## 2020-01-12 MED ORDER — BUPROPION HCL ER (XL) 150 MG PO TB24: 150.0000 mg | ORAL_TABLET | Freq: Every day | ORAL | 0 refills | Status: DC

## 2020-01-12 MED FILL — buPROPion HCL ER (XL) 150 M: 150 | 90 days supply | Qty: 90 | Fill #0

## 2020-01-12 NOTE — Progress Notes (Signed)
[facility]  Patient ID: Julie Webb,  MRN: [id number]  PCP: Debbrah Alar, NP  Date of Evaluation: 01/12/2020 Time spent:35 minutes from 1430 to 1505  Chief Complaint:   HISTORY/CURRENT STATUS: [name] is seen onsite in office 35 minutes face-to-face conjointly with husband with consent with epic collateral arriving 10 to 15 minutes late as she states she thought the appointment started at 1440 instead of 1420 with problems overwhelming the allotted appointment needing urgent work in care for decompensation in the last week of her major depression now postpartum approximately 8 weeks.  Husband who apparently has bipolar disorder with history of psychosis is currently doing well though when he is working then his mother or patient's relatives are helping with home and the baby at times.  He reviews [name]'s yesterday evening decompensation in which she felt hopelessly estranged as if not capable of thinking and talking without fully established catatonia or active psychosis.  Still, she is concerned about potential psychotic features having received Risperdal remotely in the past prior to her care in this office.  She completed pregnancy on Cymbalta 60 mg daily and at last appointment 2 weeks postpartum she declined to add Wellbutrin for what she considered hormonal good mood although she is breast-feeding and very maternal with baby in reinforcing way.  She has past hospitalization for suicidal threatening behavior being bored in the hospital and missing being on the job, though she has also had a history of social anxiety worked through so that she now has good relations and attendance to work but is supposed to return from postpartum leave 01/22/2020 unless she uses Pal time which will keep her from working weekend shifts in the future.  She is somewhat stressed by this dilemma but is not as uncomfortable about going back to work as she is mainly doubting her ability as a mother when  depressed.  She has no active suicide thoughts or plans and no auditory hallucinations or delusions of paranoia.  There is no mania or grandiosity, but rather she is helpless and initially socially avoidant today hopeless that she can restore the perfectionistic good parenting she has had until now.  She has no substance use intoxication, delirium, mania, active psychosis, or active suicidality, though she manifests a rather melancholic involution in her parenting that frightens patient and husband for what symptoms may come next.  Depression The patient presents withdepressionasa recurrentproblemwithcurrent episode startingmore than 2 weeksago. The onset quality is sudden. The problem occurs intermittently.The problemis nowacutely exacerbated post partum 2 months preparing for return to work.Associated symptoms include decreased concentration,fatigue,anergia, body aches, decreased interest,helpless, hopeless, and episodic suicidality. Associated symptoms includenochange in parenting other than not confident,no self-harm, no misperceptions, no psychosis,and not irritable.The symptoms are aggravated by expectation for work or loss of such, medication, social issues and family issues.Past treatments includeSSRIs, other medications, and psychotherapy.Compliance with treatment ispoor tovariable.Past compliance problems includesocial anxiety,medical issues, difficulty with treatment plan and medication issues.Previous treatment provided moderaterelief.Risk factors include family history, a change in medication usage/dosage, the patient not taking medications correctly, emotional abuse, prior traumatic experience, prior psychiatric admission, major life event, family history of mental illness, history of mental illness, history of self-injury and history of suicide attempt. Past medical history includes thyroid problem,recent illness,anxiety,depression,mental  health disorder,schizophreniaand suicide attempts. Pertinent negatives include no chronic pain,no terminal illness,no bipolar disorder,no eating disorder,no obsessive-compulsive disorder,no post-traumatic stress disorderand no head trauma.   Individual Medical History/ Review of Systems: Changes? Yes With weight up 3 pounds in  6 weeks still having PT for rib pain worse in pregnancy  Allergies: Ibuprofen and Phenergan [promethazine hcl]  Current Medications:  Current Outpatient Medications:  .  acetaminophen (TYLENOL) 500 MG tablet, Take 500 mg by mouth every 6 (six) hours as needed for mild pain., Disp: , Rfl:  .  aspirin 81 MG chewable tablet, Chew by mouth daily., Disp: , Rfl:  .  buPROPion (WELLBUTRIN XL) 150 MG 24 hr tablet, Take 1 tablet (150 mg total) by mouth daily after breakfast., Disp: 90 tablet, Rfl: 0 .  butalbital-acetaminophen-caffeine (FIORICET, ESGIC) 50-325-40 MG tablet, Take 1 tablet by mouth every 6 (six) hours as needed for headache. (Patient not taking: Reported on 12/29/2019), Disp: 12 tablet, Rfl: 5 .  DULoxetine (CYMBALTA) 60 MG capsule, Take 1 capsule (60 mg total) by mouth at bedtime., Disp: 90 capsule, Rfl: 1 .  lurasidone (LATUDA) 20 MG TABS tablet, Take 1 tablet (20 mg total) by mouth daily after supper., Disp: 14 tablet, Rfl: 0 .  Prenat w/o A-FeCbn-DSS-FA-DHA (PRENAISSANCE PLUS) 28-1-250 MG CAPS, Take 1 capsule by mouth daily., Disp: , Rfl:  .  promethazine (PHENERGAN) 12.5 MG tablet, Take 2 tablets (25 mg total) by mouth every 6 (six) hours as needed for nausea or vomiting. (Patient not taking: Reported on 12/29/2019), Disp: 40 tablet, Rfl: 3 Medication Side Effects: none  Family Medical/ Social History: Changes? No  MENTAL HEALTH EXAM:  Height 5\' 4"  (1.626 m), weight 150 lb (68 kg), last menstrual period 02/23/2019, currently breastfeeding.Body mass index is 25.75 kg/m.  General Appearance: Casual, Fairly Groomed and Guarded  Eye Contact:  Fair   Speech:  Clear and Coherent, Normal Rate, Slow and Talkative  Volume:  Decreased  Mood:  Anxious, Depressed, Dysphoric, Hopeless and Worthless  Affect:  Congruent, Constricted, Depressed, Inappropriate and Anxious  Thought Process:  Coherent, Goal Directed, Irrelevant and Descriptions of Associations: Tangential  Orientation:  Full (Time, Place, and Person)  Thought Content: Ilusions, Paranoid Ideation, Rumination and Tangential   Suicidal Thoughts:  Yes without intent or plan  Homicidal Thoughts:  No  Memory:  Immediate;   Good Remote;   Good  Judgement:  Impaired  Insight:  Lacking to fair  Psychomotor Activity:  Normal, Decreased, Mannerisms and Psychomotor Retardation  Concentration:  Concentration: Fair and Attention Span: Poor  Recall:  [place] of Knowledge: Good  Language: Good  Assets:  Social Support Talents/Skills Vocational/Educational  ADL's:  Intact  Cognition: WNL  Prognosis:  Fair    DIAGNOSES:    ICD-10-CM   1. Severe episode of recurrent major depressive disorder, without psychotic features ([place])  F33.2 buPROPion (WELLBUTRIN XL) 150 MG 24 hr tablet    lurasidone (LATUDA) 20 MG TABS tablet  2. Generalized anxiety disorder  F41.1   3. Panic disorder  F41.0   4. Social anxiety disorder  F40.10     Receiving Psychotherapy: Yes available with Lanetta Inch, [facility] again when willing last attending 06/21/2019   RECOMMENDATIONS: Psychosupportive psychoeducation reworks with patient and husband postpartum melancholic depressive and prepsychosis symptoms for recognition and hospitalization.  Still they wish to avoid hospitalization and patient is better today than last night but fears any next decompensation.  Over 50% of the 35-minute face-to-face time is spent in 20 minutes of counseling and coordination of care mobilizing hope and responsibility even when she doubts and feels guilty herself.  She extends this to return to work for logical preparatory and precautionary  steps that can follow including in follow-up session, no  longer insisting on returning here in 6 months as last session but willing to return in 12 days.  She is to continue the Cymbalta 60 mg every evening having current supply though she discusses potentially going up on the dose if social anxiety does become definite or panic recurs otherwise taking it for generalized anxiety and depression..  She is Escribed Wellbutrin 150 mg XL every morning after breakfast #90 with no refill sent to [facility] for depression and generalized anxiety.  She is provided samples of Latuda 20 mg every evening meal [id number] for melancholic severe recurrent major depression with episodic suicidal ideation but no intent or plan with Latuda protein bound and not likely to be significant in breastmilk though not formally tested in lactation prior to marketing.  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 Cymbalta has been well-tolerated and Wellbutrin is unlikely to be an obstacle.  She is educated on warnings and risk of diagnoses and treatment including medication for prevention and monitoring, safety hygiene, and crisis plans if needed including for hospitalization should psychotic symptoms definitely appear or suicidality occur in a more severe way.  She returns in 12 days for follow-up as husband assures adult family members will be with her if he is away at work.   Delight Hoh, MD

## 2020-01-13 ENCOUNTER — Other Ambulatory Visit: Payer: Self-pay

## 2020-01-13 ENCOUNTER — Ambulatory Visit: Payer: No Typology Code available for payment source | Admitting: Physical Therapy

## 2020-01-13 ENCOUNTER — Encounter: Payer: Self-pay | Admitting: Physical Therapy

## 2020-01-13 DIAGNOSIS — R252 Cramp and spasm: Secondary | ICD-10-CM

## 2020-01-13 DIAGNOSIS — R293 Abnormal posture: Secondary | ICD-10-CM | POA: Diagnosis not present

## 2020-01-13 DIAGNOSIS — M6281 Muscle weakness (generalized): Secondary | ICD-10-CM

## 2020-01-13 NOTE — Therapy (Addendum)
[place], [place], [address] Phone: [phone]   Fax:  [phone]  Physical Therapy Treatment  Patient Details  Name: Julie Webb MRN: [id number] Date of Birth: 1991-01-31 Referring Provider (PT): Lyndal Pulley DO   Encounter Date: 01/13/2020  PT End of Session - 01/13/20 1111    Visit Number  2   Number of Visits  9    Date for PT Re-Evaluation  02/23/20    Authorization Type  [facility]    PT Start Time  1106   pt arrived late   PT Stop Time  1147    PT Time Calculation (min)  41 min    Activity Tolerance  Patient tolerated treatment well    Behavior During Therapy  [facility] for tasks assessed/performed       Past Medical History:  Diagnosis Date  . Anxiety   . Depression   . Glomerulonephritis, poststreptococcal    age 29  . 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 Migraines   . Vaginal Pap smear, abnormal     Past Surgical History:  Procedure Laterality Date  . NO PAST SURGERIES      There were no vitals filed for this visit.  Subjective Assessment - 01/13/20 1111    Subjective  Minimal pain, night and day difference from last session.    Patient Stated Goals  Get to normal painfree function and to get stronger    Currently in Pain?  Yes    Pain Score  1     Pain Location  Rib cage    Pain Orientation  Right    Pain Descriptors / Indicators  Sore                       [facility] Adult PT Treatment/Exercise - 01/13/20 0001      Exercises   Exercises  Lumbar      Lumbar Exercises: Supine   AB Set Limitations  with tactile cues    Large Ball Abdominal Isometric  10 reps    Large Ball Oblique Isometric  10 reps    Other Supine Lumbar Exercises  ab set with ball bw knees, alt UE flx & both flx together    Other Supine Lumbar Exercises  phsyioball roll in with cues for post pelvic tilt      Lumbar Exercises: Sidelying   Hip Abduction  Both;15 reps    Hip  Abduction Limitations  with ab set and pelvic floor engagement      Manual Therapy   Manual therapy comments  skilled palpation and monitoring during TPDN    Soft tissue mobilization  intercostals       Trigger Point Dry Needling - 01/13/20 0001    Dry Needling Comments  post Rt rib cage intercostals             PT Short Term Goals - 12/29/19 1640      PT SHORT TERM GOAL #1   Title  Pt will be able to perform intial HEP with full AROM of shoulders with pain 3/10 or less    Baseline  at worst 3/10 pain in RT ribs , today 1/10    Time  3    Period  Weeks    Status  New    Target Date  01/19/20      PT SHORT TERM GOAL #2   Title  Pt will be able to perform  prone plank on elbows for 30 seconds with steady control to show improved core strength    Baseline  14 sec with visible co contraction    Time  3    Period  Weeks    Status  New    Target Date  01/19/20        PT Long Term Goals - 12/29/19 1608      PT LONG TERM GOAL #1   Title  Pt will be independent with advanced HEP for core and posterior chain to improve posture and deconditioning post partum    Time  8    Period  Weeks    Status  New    Target Date  02/23/20      PT LONG TERM GOAL #2   Title  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    Time  8    Period  Weeks    Status  New    Target Date  02/23/20      PT LONG TERM GOAL #3   Title  Pt will be able to ambulate for at least 30 min without rest break    Time  8    Target Date  02/23/20      PT LONG TERM GOAL #4   Title  Pt will be able to sleep at night for at least 5 of 7 nights without waking due to pain    Time  8    Target Date  02/23/20      PT LONG TERM GOAL #5   Title  Pt will be able to prone plank on elbows for 90 seconds to show improved core strength    Baseline  14 sec on eval    Time  8    Period  Weeks    Status  New    Target Date  02/23/20            Plan - 01/13/20 1222     Clinical Impression Statement  Began with gentle core engagement today with cues for pelvic floor engagement. Exercises created fatigue and tension in post rib cage that was decreased with DN. Will consider purchasing a physioball for hom exercise. Notable Lt rib cage flare suspected to be creating Rt rib internal rotation and intercostal tension.    PT Treatment/Interventions  ADLs/Self Care Home Management;Cryotherapy;Moist Heat;Functional mobility training;Therapeutic activities;Therapeutic exercise;Patient/family education;Neuromuscular re-education;Manual techniques;Passive range of motion;Taping;Dry needling    PT Next Visit Plan  continue 1 x a week for TPDN and strengthening for deconditioning post partum, Assess TPDN from last visit.  Do Abdominal strength post partum HEP    PT Home Exercise Plan  PRI 90-90 hip lift with Rt arm reach LT sidelying PRI exericise, door way stretch with RUE on top of door frame, serratus strengthening.    Consulted and Agree with Plan of Care  Patient       Patient will benefit from skilled therapeutic intervention in order to improve the following deficits and impairments:  Impaired UE functional use, Increased muscle spasms, Decreased activity tolerance, Pain, Improper body mechanics, Postural dysfunction  Visit Diagnosis: Abnormal posture  Cramp and spasm  Muscle weakness (generalized)     Problem List Patient Active Problem List   Diagnosis Date Noted  . Nonallopathic lesion of cervical region 12/08/2019  . Nonallopathic lesion of thoracic region 12/08/2019  . Postpartum care following vaginal delivery (2/3) 11/24/2019  . SVD (  spontaneous vaginal delivery) 11/24/2019  . Second degree perineal laceration 11/24/2019  . Encounter for planned induction of labor 11/23/2019  . Nonallopathic lesion of rib cage 10/08/2019  . Rib pain on right side 10/07/2019  . 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 Panic disorder 11/01/2018  . Generalized  anxiety disorder 11/01/2018  . Chronic migraine 12/25/2017  . Suicidal behavior with attempted self-injury ([place])   . Severe recurrent major depression without psychotic features ([place]) 12/08/2016  . Tremor 10/05/2015  . Loss of weight 07/06/2015  . Social anxiety disorder 05/30/2015  . H/O acute post-streptococcal glomerulonephritis 05/30/2015  . Contraceptive management 05/30/2015  . Migraine 05/30/2015    Greidy Sherard C. Farris Blash PT, DPT 01/13/20 12:28 PM   [facility] [address] [place], [place], [address] Phone: [phone]   Fax:  [phone]  Name: Julie Webb MRN: [id number] Date of Birth: 09-Feb-1991

## 2020-01-17 ENCOUNTER — Other Ambulatory Visit: Payer: Self-pay

## 2020-01-17 ENCOUNTER — Ambulatory Visit: Payer: No Typology Code available for payment source | Admitting: Physical Therapy

## 2020-01-17 ENCOUNTER — Encounter: Payer: Self-pay | Admitting: Physical Therapy

## 2020-01-17 DIAGNOSIS — R293 Abnormal posture: Secondary | ICD-10-CM | POA: Diagnosis not present

## 2020-01-17 DIAGNOSIS — R252 Cramp and spasm: Secondary | ICD-10-CM

## 2020-01-17 DIAGNOSIS — M6281 Muscle weakness (generalized): Secondary | ICD-10-CM

## 2020-01-17 DIAGNOSIS — M25611 Stiffness of right shoulder, not elsewhere classified: Secondary | ICD-10-CM

## 2020-01-17 NOTE — Patient Instructions (Signed)
   Voncille Lo, PT Certified Exercise Expert for the Aging Adult  01/17/20 2:26 PM Phone: [phone] Fax: [phone]

## 2020-01-17 NOTE — Therapy (Addendum)
Julie Webb, [place] Webb, [address] Phone: [phone]   Fax:  [phone]  Physical Therapy Treatment/Discharge Note  Patient Details  Name: Julie Webb MRN: [id number] Date of Birth: Jun 11, 1991 Referring Provider (PT): Lyndal Pulley DO   Encounter Date: 01/17/2020  PT End of Session - 01/17/20 1337    Visit Number  3    Number of Visits  9    Date for PT Re-Evaluation  02/23/20    Authorization Type  [facility] [employer]    PT Start Time  1335    PT Stop Time  1430    PT Time Calculation (min)  55 min    Activity Tolerance  Patient tolerated treatment well    Behavior During Therapy  [facility] for tasks assessed/performed       Past Medical History:  Diagnosis Date  . Anxiety   . Depression   . Glomerulonephritis, poststreptococcal    age 29  . 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 Migraines   . Vaginal Pap smear, abnormal     Past Surgical History:  Procedure Laterality Date  . NO PAST SURGERIES      There were no vitals filed for this visit.  Subjective Assessment - 01/17/20 1338    Subjective  I am doing OK.  I am having a hard time doing my abd at home. because of time and things just feel different since I had a baby. I am returning to work on Easter  Sunday for 12 hours I have not done bedside in months because i was on light duty    Pertinent History  post partum 29 weeks on 01-17-20    Diagnostic tests  x ray    Patient Stated Goals  Get to normal painfree function and to get stronger    Currently in Pain?  Yes    Pain Score  1     Pain Location  Rib cage    Pain Orientation  Right    Pain Descriptors / Indicators  Sore    Pain Type  Chronic pain    Pain Onset  More than a month ago                       [facility] Adult PT Treatment/Exercise - 01/17/20 0001      Posture/Postural Control   Posture Comments  Rt T10 rib depression/Lt elevation; longitudinal rotation Rt pelvis  anterior. Marked depression on Right before pregnancy      Exercises   Exercises  Lumbar      Lumbar Exercises: Supine   Ab Set  15 reps    Pelvic Tilt  10 reps    Large Ball Abdominal Isometric  10 reps    Large Ball Oblique Isometric  10 reps    Other Supine Lumbar Exercises  ab set with ball bw knees, alt UE flx & both flx together    Other Supine Lumbar Exercises  table top 30 sec hold x 10      Lumbar Exercises: Sidelying   Hip Abduction  Both;15 reps    Hip Abduction Limitations  with ab set and pelvic floor engagement      Modalities   Modalities  Moist Heat      Moist Heat Therapy   Number Minutes Moist Heat  12 Minutes    Moist Heat Location  Other (comment)   RT ribs      Manual Therapy  Manual Therapy  Soft tissue mobilization    Manual therapy comments  approximation of ribs with cues for breathing for gentle jt mobs of ribs, skilled PT for palpation with TPDN    Soft tissue mobilization  intercostals       Trigger Point Dry Needling - 01/17/20 0001    Consent Given?  Yes    Education Handout Provided  Previously provided    Muscles Treated Back/Hip  Thoracic multifidi   over ribs as needed   Dry Needling Comments  post rib cage only    Other Dry Needling  .30 gage 56m  60 mm needle for multifidus of back.    Thoracic multifidi response  Twitch response elicited;Palpable increased muscle length   Lt sidelying Ribs RT T-4 to T-9            PT Short Term Goals - 01/17/20 1344      PT SHORT TERM GOAL #1   Title  Pt will be able to perform intial HEP with full AROM of shoulders with pain 3/10 or less    Baseline  pain is 1/10 today still with wrap around rib pain    Time  3    Period  Weeks    Status  Achieved   Target Date  01/19/20      PT SHORT TERM GOAL #2   Title  Pt will be able to perform prone plank on elbows for 30 seconds with steady control to show improved core strength    Baseline  20 sec with visible co contraction    Time  3     Period  Weeks    Status  On-going        PT Long Term Goals - 01/17/20 1350      PT LONG TERM GOAL #1   Title  Pt will be independent with advanced HEP for core and posterior chain to improve posture and deconditioning post partum    Baseline  Pt working on basic abdominal strength post partum    Time  8    Period  Weeks    Status  On-going    Target Date  02/23/20      PT LONG TERM GOAL #2   Title  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    Baseline  Pt will be returning to 12 hour shift on January 22, 2020    Time  8    Period  Weeks    Status  Partially met     PT LONG TERM GOAL #3   Title  Pt will be able to ambulate for at least 30 min without rest break    Baseline  Pt able to walk dog about 30 minutes but dog needing urination breaks    Time  8    Period  Weeks    Status  On-going    Target Date  02/23/20      PT LONG TERM GOAL #4   Title  Pt will be able to sleep at night for at least 5 of 7 nights without waking due to pain    Baseline  Pt not waking due to rib pain only to feed infant son    Time  8    Period  Weeks    Status  Achieved    Target Date  02/23/20      PT LONG TERM GOAL #5   Title  Pt will be able to prone plank on elbows for 90 seconds to show improved core strength    Baseline  14 sec on eval 20 sec on 01-17-20  Most recent 20 seconds   Time  8    Period  Weeks    Status  On-going            Plan - 01/17/20 1426    Clinical Impression Statement  Pt reviewed some gentle core exericises initially with VC and TC for pelvic floor and abd engagement.  Pt was only able to hold plank for 20 seconds.  PT/pt noted sharp pain under lower ribs anterior side over soft tissue.  Possible abdominal mx attanchment but pt will ask Dr Charlann Boxer to evaluate at appt tomorrow. PT marked with Red sharpie so that MD can palpate specific place of sharp pain. Much of rib pain is resolved.  Ms Hinde does still have  depressed RT side rib cage and flared LT lower anterior rib cage.  Pt will benefit from continued skilled PT from a certified pelvic floor PT. Will DC after 1 more visit.    Personal Factors and Comorbidities  Comorbidity 1    Comorbidities  rib dysfunction,post partum 5 week    Examination-Activity Limitations  Bathing;Locomotion Level;Transfers;Bed Mobility;Reach Overhead;Bend;Sit;Sleep;Dressing;Stand;Lift    Examination-Participation Restrictions  Meal Prep;Cleaning;Community Activity;Driving    Stability/Clinical Decision Making  Stable/Uncomplicated    Clinical Decision Making  Low    Rehab Potential  Good    PT Frequency  1x / week    PT Duration  8 weeks    PT Treatment/Interventions  ADLs/Self Care Home Management;Cryotherapy;Moist Heat;Functional mobility training;Therapeutic activities;Therapeutic exercise;Patient/family education;Neuromuscular re-education;Manual techniques;Passive range of motion;Taping;Dry needling    PT Next Visit Plan  See 1 x for review of HEP before going to pelvic floor PT  Pt now 8 weeks post partum    PT Home Exercise Plan  PRI 90-90 hip lift with Rt arm reach LT sidelying PRI exericise, door way stretch with RUE on top of door frame, serratus strengthening. table top hold    Consulted and Agree with Plan of Care  Patient       Patient will benefit from skilled therapeutic intervention in order to improve the following deficits and impairments:  Impaired UE functional use, Increased muscle spasms, Decreased activity tolerance, Pain, Improper body mechanics, Postural dysfunction  Visit Diagnosis: Abnormal posture  Cramp and spasm  Muscle weakness (generalized)  Stiffness of right shoulder, not elsewhere classified     Problem List Patient Active Problem List   Diagnosis Date Noted  . Nonallopathic lesion of cervical region 12/08/2019  . Nonallopathic lesion of thoracic region 12/08/2019  . Postpartum care following vaginal delivery (2/3)  11/24/2019  . SVD (spontaneous vaginal delivery) 11/24/2019  . Second degree perineal laceration 11/24/2019  . Encounter for planned induction of labor 11/23/2019  . Nonallopathic lesion of rib cage 10/08/2019  . Rib pain on right side 10/07/2019  . 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 Panic disorder 11/01/2018  . Generalized anxiety disorder 11/01/2018  . Chronic migraine 12/25/2017  . Suicidal behavior with attempted self-injury ([place])   . 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 Tremor 10/05/2015  . Loss of weight 07/06/2015  . Social anxiety disorder 05/30/2015  . H/O acute post-streptococcal glomerulonephritis 05/30/2015  . Contraceptive management 05/30/2015  . Migraine 05/30/2015    Voncille Lo, PT Certified Exercise Expert for the Aging Adult  01/17/20 2:53  PM Phone: [phone] Fax: [facility] [address] [place], Julie Webb, [address] Phone: [phone]   Fax:  [phone]  Name: Julie Webb MRN: [id number] Date of Birth: [birth year]   PHYSICAL THERAPY DISCHARGE SUMMARY  Visits from Start of Care: 3  Current functional level related to goals / functional outcomes: As above   Remaining deficits: Most severe pain from rib pain during pregnancy partially resolved. Pt with new wrap around pain and referred to MD. Ms Hallett will DC ortho PT and start with evaluation for pelvic floor PT now that she is 8 weeks post partum delivery.  Ms Canino also has 1.1 cm gall stone and will be evaluated and return to primary MD.  Pt has appt for further pelvic floor PT to address abdominal/pelvic floor weakness that may contribute to muscle imbalance.   Ms Cappelli was a joy for whom to serve and we wish her well as she continues to strengthen at another clinic site.   Education / Equipment: Advanced HEP for rib but needs further evaluation for pelvic  PT Plan: Patient agrees to discharge.  Patient goals were partially met. Patient is being discharged due to the patient's request.  ?????    Pt with mostly resolved rib pain and pursuing pelvic floor PT and has appt at [facility] on advice of PT and referral by Charlann Boxer DO  Voncille Lo, PT Certified Exercise Expert for the Aging Adult  01/24/20 8:31 AM Phone: [phone] Fax: [phone]

## 2020-01-18 ENCOUNTER — Ambulatory Visit (INDEPENDENT_AMBULATORY_CARE_PROVIDER_SITE_OTHER): Payer: No Typology Code available for payment source | Admitting: Family Medicine

## 2020-01-18 ENCOUNTER — Ambulatory Visit (INDEPENDENT_AMBULATORY_CARE_PROVIDER_SITE_OTHER): Payer: No Typology Code available for payment source

## 2020-01-18 ENCOUNTER — Encounter: Payer: Self-pay | Admitting: Family Medicine

## 2020-01-18 VITALS — BP 90/64 | HR 91 | Ht 64.0 in | Wt 147.0 lb

## 2020-01-18 DIAGNOSIS — R0781 Pleurodynia: Secondary | ICD-10-CM

## 2020-01-18 DIAGNOSIS — N8189 Other female genital prolapse: Secondary | ICD-10-CM | POA: Diagnosis not present

## 2020-01-18 DIAGNOSIS — M999 Biomechanical lesion, unspecified: Secondary | ICD-10-CM | POA: Diagnosis not present

## 2020-01-18 NOTE — Progress Notes (Signed)
[place] [address] [place] Phone: [phone] Subjective:   I Julie Webb am serving as a Education administrator for Dr. Hulan Saas.  This visit occurred during the SARS-CoV-2 public health emergency.  Safety protocols were in place, including screening questions prior to the visit, additional usage of staff PPE, and extensive cleaning of exam room while observing appropriate contact time as indicated for disinfecting solutions.   I'm seeing this patient by the request  of:  Debbrah Alar, NP  CC: Right rib side follow-up  [id number]  Julie Webb is a 29 y.o. female coming in with complaint of neck and upper back pain. Last seen on 12/07/2019 for OMT. Patient states she has sharp abdominal pains. Patient believes it is more muscular. Believes PT should be more focused on abdominal postpartum exercise. Overall ribs feel a lot better.  Has been doing physical therapy.  Has made improvement but more of the abdominal pain is still there.  Patient has also been working with PT and does have unfortunately some pelvic floor dysfunction and decrease in strength of the abdomen that she is having difficulty with.     Past Medical History:  Diagnosis Date  . Anxiety   . Depression   . Glomerulonephritis, poststreptococcal    age 29  . 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 Migraines   . Vaginal Pap smear, abnormal    Past Surgical History:  Procedure Laterality Date  . NO PAST SURGERIES     Social History   Socioeconomic History  . Marital status: Married    Spouse name: Not on file  . Number of children: Not on file  . Years of education: Not on file  . Highest education level: Not on file  Occupational History  . Not on file  Tobacco Use  . Smoking status: Never Smoker  . Smokeless tobacco: Never Used  Substance and Sexual Activity  . Alcohol use: Not Currently    Comment: occasionally- once a month if then pt reports   . Drug use: No  . Sexual activity: Yes    Birth control/protection: Condom  Other Topics Concern  . 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 Difficulty of Paying Living Expenses:   Food Insecurity:   . Worried About Charity fundraiser in the Last Year:   . Arboriculturist in the Last Year:   Transportation Needs:   . Film/video editor (Medical):   Marland Kitchen Lack of Transportation (Non-Medical):   Physical Activity:   . Days of Exercise per Week:   . Minutes of Exercise per Session:   Stress:   . Feeling of Stress :   Social Connections:   . Frequency of Communication with Friends and Family:   . Frequency of Social Gatherings with Friends and Family:   . Attends Religious Services:   . Active Member of Clubs or Organizations:   . Attends Archivist Meetings:   Marland Kitchen Marital Status:    Allergies  Allergen Reactions  . Ibuprofen Other (See Comments)    Kidney failure  . Phenergan [Promethazine Hcl] Other (See Comments)    Restlessness w/ IV Phenergen.  Probable mild EPS. Tolerates if taken w/ Benadryl.    Family History  Problem Relation Age of Onset  . Hyperlipidemia Mother   . Mental illness Mother   . Fibroids Mother        uterine  . Hypertension Father   . Heart attack Father   . Heart disease Maternal Grandmother   . Hyperlipidemia Maternal Grandmother   . Hypertension Maternal Grandmother   . Heart disease Maternal Grandfather   . Hyperlipidemia Maternal Grandfather   . Hypertension Maternal Grandfather   . Pancreatic cancer Paternal Grandmother   . Mesothelioma Paternal Grandfather       Current Outpatient Medications (Respiratory):  .  promethazine (PHENERGAN) 12.5 MG tablet, Take 2 tablets (25 mg total) by mouth every 6 (six)  hours as needed for nausea or vomiting.  Current Outpatient Medications (Analgesics):  .  acetaminophen (TYLENOL) 500 MG tablet, Take 500 mg by mouth every 6 (six) hours as needed for mild pain. Marland Kitchen  aspirin 81 MG chewable tablet, Chew by mouth daily. .  butalbital-acetaminophen-caffeine (FIORICET, ESGIC) 50-325-40 MG tablet, Take 1 tablet by mouth every 6 (six) hours as needed for headache.   Current Outpatient Medications (Other):  Marland Kitchen  buPROPion (WELLBUTRIN XL) 150 MG 24 hr tablet, Take 1 tablet (150 mg total) by mouth daily after breakfast. .  DULoxetine (CYMBALTA) 60 MG capsule, Take 1 capsule (60 mg total) by mouth at bedtime. Marland Kitchen  lurasidone (LATUDA) 20 MG TABS tablet, Take 1 tablet (20 mg total) by mouth daily after supper. .  Prenat w/o A-FeCbn-DSS-FA-DHA (PRENAISSANCE PLUS) 28-1-250 MG CAPS, Take 1 capsule by mouth daily.   Reviewed prior external information including notes and imaging from  primary care provider As well as notes that were available from care everywhere and other healthcare systems.  Past medical history, social, surgical and family history all reviewed in electronic medical record.  No pertanent information unless stated regarding to the chief complaint.   Review of Systems:  No headache, visual changes, nausea, vomiting, diarrhea, constipation, dizziness, skin rash, fevers, chills, night sweats, weight loss, swollen lymph nodes,, joint swelling, chest pain, shortness of breath, mood changes. POSITIVE muscle aches, body aches, abdominal pain  Objective  Blood pressure 90/64, pulse 91, height 5\' 4"  (1.626 m), weight 147 lb (66.7 kg), last menstrual period 02/23/2019, SpO2 98 %, currently breastfeeding.   General: No apparent distress alert and oriented x3 mood and affect normal, dressed appropriately.  HEENT: Pupils equal, extraocular movements intact  Respiratory: Patient's speak in full sentences and does not appear short of breath  Cardiovascular: No lower  extremity edema, non tender, no erythema  Neuro: Cranial nerves II through XII are intact, neurovascularly intact in all extremities with 2+ DTRs and 2+ pulses.  Abdominal exam shows the patient does have a mild positive Murphy sign in the right upper quadrant.  Less pain over the area that was incarcerated previously Gait normal with good balance and coordination.  MSK:  tender with full range of motion and good stability and symmetric strength and tone of shoulders, elbows, wrist, hip, knee and ankles bilaterally.  Back exam shows some tightness noted in the right scapular region.  Patient is tender to palpation in this area.  Patient does have better movement though of the scapula.  Mild tightness of the lower back noted.  Mild positive FABER test on the right side as well.  Osteopathic findings C2 flexed rotated and side bent right C6 flexed rotated and side bent left T3  extended rotated and side bent right inhaled third rib     Impression and Recommendations:     This case required medical decision making of moderate complexity. The above documentation has been reviewed and is accurate and complete Lyndal Pulley, DO       Note: This dictation was prepared with Dragon dictation along with smaller phrase technology. Any transcriptional errors that result from this process are unintentional.

## 2020-01-18 NOTE — Patient Instructions (Signed)
Xray today Will get a call about the [place] See me again in 4 weeks

## 2020-01-18 NOTE — Assessment & Plan Note (Signed)
Patient is having difficulty with abdominal strength.  I would like to refer patient to physical therapy for pelvic floor dysfunction that could be contributing as well.  Patient is in agreement with the plan.  This was also suggested by patient's other physical therapist.

## 2020-01-18 NOTE — Assessment & Plan Note (Signed)
Chronic problem with mild exacerbation continues to have rib pain on the right side status post partum.  Will check right upper quadrant ultrasound to see if there is anything that could be contributing at the moment.  Discussed icing regimen and home exercise, discussed topical anti-inflammatories.  Patient will follow up with me again in 6 to 8 weeks.  Discussed chronic medications including Cymbalta

## 2020-01-18 NOTE — Assessment & Plan Note (Signed)
   Decision today to treat with OMT was based on Physical Exam  After verbal consent patient was treated with HVLA, ME, FPR techniques in cervical, thoracic, rib,areas, all areas are chronic   Patient tolerated the procedure well with improvement in symptoms  Patient given exercises, stretches and lifestyle modifications  See medications in patient instructions if given  Patient will follow up in 4-8 weeks 

## 2020-01-19 ENCOUNTER — Encounter: Payer: Self-pay | Admitting: Family

## 2020-01-19 ENCOUNTER — Ambulatory Visit
Admission: RE | Admit: 2020-01-19 | Discharge: 2020-01-19 | Disposition: A | Discharge status: Home / Self Care | Payer: No Typology Code available for payment source | Source: Ambulatory Visit | Attending: Family Medicine | Admitting: Family Medicine

## 2020-01-19 DIAGNOSIS — K8020 Calculus of gallbladder without cholecystitis without obstruction: Secondary | ICD-10-CM

## 2020-01-19 DIAGNOSIS — R0781 Pleurodynia: Secondary | ICD-10-CM

## 2020-01-24 ENCOUNTER — Encounter: Payer: Self-pay | Admitting: Psychiatry

## 2020-01-24 ENCOUNTER — Ambulatory Visit (INDEPENDENT_AMBULATORY_CARE_PROVIDER_SITE_OTHER): Payer: No Typology Code available for payment source | Admitting: Psychiatry

## 2020-01-24 ENCOUNTER — Other Ambulatory Visit: Payer: Self-pay

## 2020-01-24 ENCOUNTER — Ambulatory Visit: Payer: No Typology Code available for payment source | Admitting: Physical Therapy

## 2020-01-24 VITALS — Ht 64.0 in | Wt 149.0 lb

## 2020-01-24 DIAGNOSIS — F4010 Social phobia, unspecified: Secondary | ICD-10-CM | POA: Diagnosis not present

## 2020-01-24 DIAGNOSIS — F332 Major depressive disorder, recurrent severe without psychotic features: Secondary | ICD-10-CM

## 2020-01-24 DIAGNOSIS — F411 Generalized anxiety disorder: Secondary | ICD-10-CM

## 2020-01-24 DIAGNOSIS — F410 Panic disorder [episodic paroxysmal anxiety] without agoraphobia: Secondary | ICD-10-CM | POA: Diagnosis not present

## 2020-01-24 MED ORDER — LURASIDONE HCL 20 MG PO TABS: 20.0000 mg | ORAL_TABLET | Freq: Every day | ORAL | 0 refills | Status: DC

## 2020-01-24 MED ORDER — LATUDA 20 MG PO TABS: 20.0000 mg | ORAL_TABLET | Freq: Every day | ORAL | 0 refills | Status: DC

## 2020-01-24 NOTE — Progress Notes (Signed)
[facility]  Patient ID: Julie Webb,  MRN: [id number]  PCP: Debbrah Alar, NP  Date of Evaluation: 01/24/2020 Time spent:25 minutes from 1435 to 1500  Chief Complaint:  Chief Complaint    Depression; Anxiety; Panic Attack      HISTORY/CURRENT STATUS: Julie Webb is seen on site in office 25 minutes face-to-face individually as but not present today with consent with epic collateral for psychiatric interview and exam in 2-week evaluation and management of postpartum exacerbation of major depressive disorder, panic and generalized/social anxiety, and new clarification of contributions to subcostal pain.  In the interim, the patient started [place] and Wellbutrin successfully in combination with Cymbalta 60 mg taking Latuda as 20 mg after supper and the Wellbutrin as the 150 mg XL after breakfast..  She has had more headache possibly from under hydration when she is breast-feeding, as she has no headache at work when she is not nursing but headache seeming to occur before and after work.  Tylenol relieves the headache, and she will hydrate more effectively. She also considers Latuda the possible cause of the headache as she is adapting to the medication but is pleased with overall efficacy.  She has no headache from Wellbutrin or Cymbalta in the past.  She returned to work 01/22/2020 as a single day on weekend shift.  She was tired the next day after work but otherwise tolerated it quite well and mood improved with return.  She could work effectively on the job and has no postpartum psychosis symptoms.  In the interim her sports medicine physician treating costal and subcostal pain including for pelvic relaxation ordered ultrasound finding she has gallstones making surgical referral for such.  Her baby and husband are doing well.  She has no mania, suicidality, psychosis or delirium.  Depression The patient presents withdepressionasa recurrentproblemwithcurrent episode  startingmore than 4 weeksago. The onset quality is sudden. The problem occurs intermittently.The problem2 months exacerbated post partum.Associated symptoms include decreased concentration,fatigue, low energy,body aches, and decreased interest, Associated symptoms includenochange in parenting,no self-harm,no misperceptions, no psychosis,no irritability, no helpless, no hopeless,and no suicidality.The symptoms are aggravated by expectation for work or loss of such, medication, social issues and family issues.Past treatments includeSSRIs, other medications, and psychotherapy.Compliance with treatment ispoor tovariable.Past compliance problems includesocial anxiety,medical issues, difficulty with treatment plan and medication issues.Previous treatment provided moderaterelief.Risk factors include family history, a change in medication usage/dosage, the patient not taking medications correctly, emotional abuse, prior traumatic experience, prior psychiatric admission, major life event, family history of mental illness, history of mental illness, history of self-injury and history of suicide attempt. Past medical history includes thyroid problem,recent illness,anxiety,depression,mental health disorder,schizophreniaand suicide attempts. Pertinent negatives include no chronic pain,no terminal illness,no bipolar disorder,no eating disorder,no obsessive-compulsive disorder,no post-traumatic stress disorderand no head trauma.  Individual Medical History/ Review of Systems: Changes? :Yes with diagnosis of gallstones in the interim  Allergies: Ibuprofen and Phenergan [promethazine hcl]  Current Medications:  Current Outpatient Medications:  .  acetaminophen (TYLENOL) 500 MG tablet, Take 500 mg by mouth every 6 (six) hours as needed for mild pain., Disp: , Rfl:  .  aspirin 81 MG chewable tablet, Chew by mouth daily., Disp: , Rfl:  .  buPROPion (WELLBUTRIN XL)  150 MG 24 hr tablet, Take 1 tablet (150 mg total) by mouth daily after breakfast., Disp: 90 tablet, Rfl: 0 .  butalbital-acetaminophen-caffeine (FIORICET, ESGIC) 50-325-40 MG tablet, Take 1 tablet by mouth every 6 (six) hours as needed for headache., Disp: 12 tablet, Rfl: 5 .  DULoxetine (CYMBALTA) 60 MG capsule, Take 1 capsule (60 mg total) by mouth at bedtime., Disp: 90 capsule, Rfl: 1 .  lurasidone (LATUDA) 20 MG TABS tablet, Take 1 tablet (20 mg total) by mouth daily after supper., Disp: 21 tablet, Rfl: 0 .  lurasidone (LATUDA) 20 MG TABS tablet, Take 1 tablet (20 mg total) by mouth daily after supper., Disp: 90 tablet, Rfl: 0 .  Prenat w/o A-FeCbn-DSS-FA-DHA (PRENAISSANCE PLUS) 28-1-250 MG CAPS, Take 1 capsule by mouth daily., Disp: , Rfl:  .  promethazine (PHENERGAN) 12.5 MG tablet, Take 2 tablets (25 mg total) by mouth every 6 (six) hours as needed for nausea or vomiting., Disp: 40 tablet, Rfl: 3  Medication Side Effects: headache  Family Medical/ Social History: Changes? No  MENTAL HEALTH EXAM:  Height 5\' 4"  (1.626 m), weight 149 lb (67.6 kg), last menstrual period 02/23/2019, currently breastfeeding.Body mass index is 25.58 kg/m. Muscle strengths and tone 5/5, postural reflexes and gait 0/0, and AIMS = 0 otherwise deferred for coronavirus shutdown  General Appearance: Casual, Guarded and Well Groomed  Eye Contact:  Good  Speech:  Clear and Coherent, Normal Rate and Talkative  Volume:  Normal  Mood:  Anxious, Depressed, Dysphoric and Euthymic  Affect:  Congruent, Depressed, Full Range and Anxious  Thought Process:  Coherent, Goal Directed, Irrelevant and Descriptions of Associations: Tangential  Orientation:  Full (Time, Place, and Person)  Thought Content: Ilusions, Rumination and Tangential   Suicidal Thoughts:  No  Homicidal Thoughts:  No  Memory:  Immediate;   Good Remote;   Good  Judgement:  Good  Insight:  Fair  Psychomotor Activity:  Normal and Mannerisms   Concentration:  Concentration: Good and Attention Span: Good  Recall:  Good  Fund of Knowledge: Good  Language: Good  Assets:  Desire for Improvement Social Support Talents/Skills Vocational/Educational  ADL's:  Intact  Cognition: WNL  Prognosis:  Fair to good    DIAGNOSES:    ICD-10-CM   1. Severe recurrent major depression without psychotic features (HCC)  F33.2 lurasidone (LATUDA) 20 MG TABS tablet    lurasidone (LATUDA) 20 MG TABS tablet  2. Panic disorder  F41.0   3. Social anxiety disorder  F40.10   4. Generalized anxiety disorder  F41.1     Receiving Psychotherapy: No but available with Lanetta Inch, [facility] again when willing last attending 06/21/2019   RECOMMENDATIONS: Reinforcement of recruited progress is consolidated for coping and security toward recovery.  Psychosupportive psychoeducation again addresses differential of postpartum exaceration or depression in contrast to postpartum psychosis not currently evident.  Infant is doing well with current breast feeding and family is effectively collaborating.  She is provided Latuda 20 mg daily after supper as samples #21 with eScription of #90 with no refill to [facility] with co-pay coupon for major depression.  She has adequate supply of Cymbalta 60 mg every bedtime and Wellbutrin 150 mg XL every morning after breakfast for depressive and anxiety disorders.  Therapy is available, and she returns for follow-up in 3 weeks or sooner if needed being thus far successful with return to work.   Delight Hoh, MD

## 2020-02-01 ENCOUNTER — Encounter: Payer: No Typology Code available for payment source | Admitting: Physical Therapy

## 2020-02-07 ENCOUNTER — Encounter: Payer: No Typology Code available for payment source | Admitting: Physical Therapy

## 2020-02-08 ENCOUNTER — Encounter: Payer: Self-pay | Admitting: Physical Therapy

## 2020-02-08 ENCOUNTER — Other Ambulatory Visit: Payer: Self-pay

## 2020-02-08 ENCOUNTER — Ambulatory Visit: Payer: No Typology Code available for payment source | Attending: Family Medicine | Admitting: Physical Therapy

## 2020-02-08 DIAGNOSIS — R278 Other lack of coordination: Secondary | ICD-10-CM | POA: Insufficient documentation

## 2020-02-08 DIAGNOSIS — M6281 Muscle weakness (generalized): Secondary | ICD-10-CM | POA: Insufficient documentation

## 2020-02-08 NOTE — Therapy (Signed)
[facility] [address], [place], [place], [address] Phone: [phone]   Fax:  [phone]  Physical Therapy Evaluation  Patient Details  Name: Julie Webb MRN: [id number] Date of Birth: 1991/06/01 Referring Provider (PT): Lyndal Pulley, DO   Encounter Date: 02/08/2020  PT End of Session - 02/08/20 1250    Visit Number  1    Number of Visits  8    Date for PT Re-Evaluation  04/04/20    Authorization Type  [facility] F[facility]US    PT Start Time  1200   Pt 15 min late   PT Stop Time  1245    PT Time Calculation (min)  45 min    Activity Tolerance  Patient tolerated treatment well    Behavior During Therapy  [facility] for tasks assessed/performed       Past Medical History:  Diagnosis Date  . Anxiety   . Depression   . Glomerulonephritis, poststreptococcal    age 29  . 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 Migraines   . Vaginal Pap smear, abnormal     Past Surgical History:  Procedure Laterality Date  . NO PAST SURGERIES      There were no vitals filed for this visit.   Subjective Assessment - 02/08/20 1200    Subjective  Pt referred to PT with PF and abdominal weakness which was exacerbated by recent pregnancy/delivery.  Vaginal delivery 11/23/19 with Gr 2 tear needing 1-2 stitches.  9lb6oz baby boy.  Pt notes occassional leakage when bladder is really full.  Occassional passage of unwanted gas.    Pertinent History  [place] RUQ: gallstones and sludge, referred for possible surgery, had PT at [facility] facility during pregnancy for Rt rib pain/DN which is better since delivery    How long can you sit comfortably?  -    How long can you stand comfortably?  -    How long can you walk comfortably?  -    Diagnostic tests  [place] RUQ: gallstones and sludge, referred for possible surgery, follow up with surgeon is next week    Patient Stated Goals  get stronger    Currently in Pain?  No/denies    Aggravating Factors   full  bladder, working as Therapist, sports doing physical tasks in ICU         [facility] PT Assessment - 02/08/20 0001      Assessment   Medical Diagnosis  N81.89 (ICD-10-CM) - Pelvic floor weakness in female    Referring Provider (PT)  Lyndal Pulley, DO    Onset Date/Surgical Date  11/23/19   generally weak all my life   Prior Therapy  yes      Precautions   Precautions  None    Precaution Comments  cleared at 6 week visit post-delivery       Restrictions   Weight Bearing Restrictions  No      Balance Screen   Has the patient fallen in the past 6 months  No      [place] residence    Living Arrangements  Spouse/significant other;Children      Prior Function   Level of Independence  Independent    Vocation  Part time employment   night shift Sat/Sun    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[employer], lifting, turning patients, standing    Leisure  has newborn, walk dogs      Cognition  Overall Cognitive Status  Within Functional Limits for tasks assessed      Functional Tests   Functional tests  Single leg stance;Squat      Squat   Comments  unlocking Rt SI joint, no pain      Single Leg Stance   Comments  fair SLS balance bil      ROM / Strength   AROM / PROM / Strength  AROM;Strength      AROM   Overall AROM Comments  hips WNL, trunk WNL with Rt upper quadrant pain on Lt SB      Strength   Overall Strength Comments  4/5 throughout bil LEs, 4-/5 throughout trunk      Flexibility   Soft Tissue Assessment /Muscle Length  no      Palpation   Palpation comment  no external tenderness                Objective measurements completed on examination: See above findings.    Pelvic Floor Special Questions - 02/08/20 0001    Prior Pelvic/Prostate Exam  Yes    Result Pelvic/Prostate Exam   normal    Are you Pregnant or attempting pregnancy?  No   has IUD   Prior Pregnancies  Yes    Number of Pregnancies  1    Number of C-Sections  0     Number of Vaginal Deliveries  1    Any difficulty with labor and deliveries  Yes   Gr 2 tear   Currently Sexually Active  Yes    Is this Painful  Yes    History of sexually transmitted disease  No    Marinoff Scale  pain interrupts completion    Urinary Leakage  Yes    How often  3x/week    Pad use  liners for spotting    Activities that cause leaking  Lifting;Other    Other activities that cause leaking  full bladder    Urinary urgency  Yes    Urinary frequency  5-7 x/day    Fecal incontinence  Yes   GAS ONLY   Fluid intake  coffee x 2 cups, occassional Dr Malachi Bonds, needs to drink more water    Caffeine beverages  2 a day    Falling out feeling (prolapse)  No    External Perineal Exam  PT explained exam and Pt gave verbal consent    Skin Integrity  Intact    Scar  Tear;Well healed;Restricted    Perineal Body/Introitus   Descended    External Palpation  tender STP on Rt    Prolapse  None    Pelvic Floor Internal Exam  PT explained exam and Pt gave verbal consent    Exam Type  Vaginal    Sensation  intact    Palpation  tender introitus 4-8:00, levator ani bil, obturator internus bil    Strength  Flicker   signif use of accessory muscles   Strength # of reps  2   pain with quick flicks   Strength # of seconds  2    Tone  decreased               PT Education - 02/08/20 1251    Education Details  Access Code: [email or web address]    Person(s) Educated  Patient    Methods  Explanation;Demonstration;Verbal cues;Handout;Tactile cues    Comprehension  Verbalized understanding;Returned demonstration       PT Short Term Goals - 02/08/20 1303  PT SHORT TERM GOAL #1   Title  Pt will demo ability to isolate proper contraction of pelvic floor.    Baseline  -    Time  4    Period  Weeks    Status  New    Target Date  03/07/20      PT SHORT TERM GOAL #2   Title  Pt will learn and implement introitus massage and stretching to improve tolerance of vaginal penetration.     Baseline  -    Time  4    Period  Weeks    Status  New    Target Date  03/07/20      PT SHORT TERM GOAL #3   Title  Pt will learn bowel routine and toileting techniques to reduce incidence of constipation and reduce strain on pelvic floor with defecation.    Time  4    Period  Weeks    Status  New    Target Date  03/07/20        PT Long Term Goals - 02/08/20 1305      PT LONG TERM GOAL #1   Title  Pt will be ind with advanced HEP and understand how to safely progress    Baseline  -    Time  8    Period  Weeks    Status  New    Target Date  04/04/20      PT LONG TERM GOAL #2   Title  Pt will achieve at least 2/5 pelvic floor strength and be able to perform 10 quick flicks without pain to reduce occurence of unwanted gas and urinary leakage.    Baseline  -    Time  8    Period  Weeks    Status  New    Target Date  04/04/20      PT LONG TERM GOAL #3   Title  Pt will demo proper body mechanics with breath/core control for bending/lifting to reduce strain on pelvic floor with newborn care tasks and work tasks in ICU    Baseline  -    Time  8    Period  Weeks    Status  New    Target Date  04/04/20      PT LONG TERM GOAL #4   Title  Pt will score 0 or 1 on Marinoff schedule to demo improved tolerance and reduced pain with vaginal penetration.    Baseline  -    Time  8    Period  Weeks    Status  New    Target Date  04/04/20      PT LONG TERM GOAL #5   Title  Pt will achieve at least 4+/5 trunk and LE strength for improved performance of bend/lift/squat for work tasks.    Baseline  -    Time  8    Period  Weeks    Status  New    Target Date  04/04/20             Plan - 02/08/20 1252    Clinical Impression Statement  Pt is nearly 3 mos post-vaginal delivery of her son.  She is currently breastfeeding.  She reports she has always been weak but is now weaker since being pregnant.  Her goal is to get stronger through trunk and PF.  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  She occassionally passes unwanted gas.  She has intermittent mild constipation.  She presents with pelvic floor weakness, PF dyssynergia, pelvic floor pain on palpation bil, introitus pain with minor scar tissue present from Gr 2 tear with delivery, laxity in Rt SI joint.  She scores 2/3 on Marinoff scale since cleared from vaginal rest.  She has 1 finger separation above and at umbilicus with good tension.  LE strength is 4/5 throughout.  Trunk ROM and LE flexibility is WFL.  Pt will benefit from skilled PT to address deficits and improve overall function and health of pelvic floor.    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    Examination-Activity Limitations  Lift;Continence;Toileting;Squat;Bend    Examination-Participation Restrictions  Other   heavy work Presenter, broadcasting  Stable/Uncomplicated    Clinical Decision Making  Low    Rehab Potential  Good    PT Frequency  1x / week    PT Duration  8 weeks    PT Treatment/Interventions  ADLs/Self Care Home Management;Biofeedback;Cryotherapy;Electrical Stimulation;Moist Heat;Neuromuscular re-education;Balance training;Therapeutic exercise;Functional mobility training;Patient/family education;Manual techniques;Scar mobilization;Joint Manipulations;Spinal Manipulations    PT Next Visit Plan  f/u on HEP and samples, is introitus massage helping, biofeedback for PF, intro TrA, see if accessory muscles help PF strength, internal STM bil    PT Home Exercise Plan  Access Code: [email or web address] (PF contractions low grade only due to pain with quick flicks) + abdominal massage + introitus massage/stretching no dilator    Consulted and  Agree with Plan of Care  Patient       Patient will benefit from skilled therapeutic intervention in order to improve the following deficits and impairments:  Decreased coordination, Increased muscle spasms, Pain, Decreased activity tolerance, Decreased strength, Decreased balance, Decreased scar mobility, Improper body mechanics  Visit Diagnosis: Muscle weakness (generalized) - Plan: PT plan of care cert/re-cert  Other lack of coordination - Plan: PT plan of care cert/re-cert     Problem List Patient Active Problem List   Diagnosis Date Noted  . Nonallopathic lesion of cervical region 12/08/2019  . Nonallopathic lesion of thoracic region 12/08/2019  . Encounter for planned induction of labor 11/23/2019  . Nonallopathic lesion of rib cage 10/08/2019  . Rib pain on right side 10/07/2019  . 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 Panic disorder 11/01/2018  . Generalized anxiety disorder 11/01/2018  . Chronic migraine 12/25/2017  . Suicidal behavior with attempted self-injury ([place])   . Severe recurrent major depression without psychotic features ([place]) 12/08/2016  . Tremor 10/05/2015  . Loss of weight 07/06/2015  . Social anxiety disorder 05/30/2015  . H/O acute post-streptococcal glomerulonephritis 05/30/2015  . Contraceptive management 05/30/2015  . Migraine 05/30/2015    Baruch Merl, PT 02/08/20 1:20 PM   [facility] [address], [place], [place], [address] Phone: [phone]   Fax:  [phone]  Name: Julie Webb MRN: [id number] Date of Birth: January 21, 1991

## 2020-02-08 NOTE — Patient Instructions (Addendum)
Access Code: [email or web address] URL: [email or web address]: 04/21/2021Prepared by: Venetia Night BeuhringExercises  Supine Pelvic Floor Contract and Release - 3 x daily - 7 x weekly - 1 sets - 10 reps - 5 hold  Sidelying Pelvic Floor Contraction with Self-Palpation - 1 x daily - 7 x weekly - 1 sets - 10 reps - 5 hold  About Abdominal Massage  Abdominal massage, also called external colon massage, is a self-treatment circular massage technique that can reduce and eliminate gas and ease constipation. The colon naturally contracts in waves in a clockwise direction starting from inside the right hip, moving up toward the ribs, across the belly, and down inside the left hip.  When you perform circular abdominal massage, you help stimulate your colon's normal wave pattern of movement called peristalsis.  It is most beneficial when done after eating.  Positioning You can practice abdominal massage with oil while lying down, or in the shower with soap.  Some people find that it is just as effective to do the massage through clothing while sitting or standing.  How to Massage Start by placing your finger tips or knuckles on your right side, just inside your hip bone.  . Make small circular movements while you move upward toward your rib cage.   . Once you reach the bottom right side of your rib cage, take your circular movements across to the left side of the bottom of your rib cage.  . Next, move downward until you reach the inside of your left hip bone.  This is the path your feces travel in your colon. . Continue to perform your abdominal massage in this pattern for 10 minutes each day.     You can apply as much pressure as is comfortable in your massage.  Start gently and build pressure as you continue to practice.  Notice any areas of pain as you massage; areas of slight pain may be relieved as you massage, but if you have areas of significant or intense pain, consult with your healthcare  provider.  Other Considerations . General physical activity including bending and stretching can have a beneficial massage-like effect on the colon.  Deep breathing can also stimulate the colon because breathing deeply activates the same nervous system that supplies the colon.   . Abdominal massage should always be used in combination with a bowel-conscious diet that is high in the proper type of fiber for you, fluids (primarily water), and a regular exercise program.  STRETCHING THE PELVIC FLOOR MUSCLES NO DILATOR  Supplies . Vaginal lubricant . Mirror (optional) . Gloves (optional) Positioning . Start in a semi-reclined position with your head propped up. Bend your knees and place your thumb or finger at the vaginal opening. Procedure . Apply a moderate amount of lubricant on the outer skin of your vagina, the labia minora.  Apply additional lubricant to your finger. Marland Kitchen Spread the skin away from the vaginal opening. Place the end of your finger at the opening. . Do a maximum contraction of the pelvic floor muscles. Tighten the vagina and the anus maximally and relax. . When you know they are relaxed, gently and slowly insert your finger into your vagina, directing your finger slightly downward, for 2-3 inches of insertion. . Relax and stretch the 6 o'clock position . Hold each stretch for _2 min__ and repeat __1_ time with rest breaks of _1__ seconds between each stretch. . Repeat the stretching in the 4 o'clock and 8 o'clock positions. . Total time should be _6__  minutes, _1__ x per day.  Note the amount of theme your were able to achieve and your tolerance to your finger in your vagina. . Once you have accomplished the techniques you may try them in standing with one foot resting on the tub, or in other positions.  This is a good stretch to do in the shower if you don't need to use lubricant.

## 2020-02-14 ENCOUNTER — Encounter: Payer: Self-pay | Admitting: Psychiatry

## 2020-02-14 ENCOUNTER — Encounter: Payer: No Typology Code available for payment source | Admitting: Physical Therapy

## 2020-02-14 ENCOUNTER — Ambulatory Visit (INDEPENDENT_AMBULATORY_CARE_PROVIDER_SITE_OTHER): Payer: No Typology Code available for payment source | Admitting: Psychiatry

## 2020-02-14 ENCOUNTER — Other Ambulatory Visit: Payer: Self-pay

## 2020-02-14 VITALS — Ht 64.0 in | Wt 143.0 lb

## 2020-02-14 DIAGNOSIS — F4010 Social phobia, unspecified: Secondary | ICD-10-CM

## 2020-02-14 DIAGNOSIS — F332 Major depressive disorder, recurrent severe without psychotic features: Secondary | ICD-10-CM

## 2020-02-14 DIAGNOSIS — F410 Panic disorder [episodic paroxysmal anxiety] without agoraphobia: Secondary | ICD-10-CM

## 2020-02-14 DIAGNOSIS — F411 Generalized anxiety disorder: Secondary | ICD-10-CM | POA: Diagnosis not present

## 2020-02-14 DIAGNOSIS — F330 Major depressive disorder, recurrent, mild: Secondary | ICD-10-CM

## 2020-02-14 MED ORDER — LURASIDONE HCL 20 MG PO TABS: 10.0000 mg | ORAL_TABLET | Freq: Every day | ORAL | 0 refills | Status: DC

## 2020-02-14 NOTE — Progress Notes (Signed)
[facility]  Patient ID: Julie Webb,  MRN: [id number]  PCP: Debbrah Alar, NP  Date of Evaluation: 02/14/2020 Time spent:20 minutes from 1155 to 1215  Chief Complaint:  Chief Complaint    Depression; Anxiety; Panic Attack      HISTORY/CURRENT STATUS: Julie Webb is seen onsite in office 20 minutes face-to-face arriving here 15 minutes late individually with consent with epic collateral for psychiatric interview and exam in 3-week evaluation and management of major depression exacerbating postpartum, social and generalized anxiety, and panic disorder nearly 3 months postpartum as of 02/20/2020.  Weight is down 6 pounds but breast milk production is reduced and slowed so that volume is decreased baby exhausting supply at times.  She must pump for 30 minutes at work to supply the baby's needs while on duty but has been working each weekend her full shifts without difficulty finding the work rewarding.  As we process Wellbutrin or Latuda as the newer medicines in her regimen likely responsible for the reduced lactation, Latuda is probably operative through hormonal means and Wellbutrin if at all likely is more hydration related.  However patient notes that her headaches did resolve as she has adapted to medications so that she is having no problems now except still dealing with the costal pains whether or not these are related to gallstones found on an ultrasound for which she sees the surgeon in 2 days.  He is apprehensive about seeing the surgeon and we review the manner and applications for findings given at appointment whether the gallbladder likely contributes to her costal pains and whether management can be other than elective surgery or any need for more immediate surgery.  However she states she not have 2 weeks to be out again at this time expectating that will be the time of recovery off work if she has a cholecystectomy in the near future.  Husband is having more seasonal mood  difficulties now, and she is stable and supportive.  She continues Cymbalta 60 mg, Wellbutrin 150 mg XL and Latuda 20 mg as we discuss lactation support when nothing else has been adjusted or changed seemingly best to reduce Latuda somewhat.  She asks about switching to Risperdal as she did well with that medication in the past.  Baby's development is excellent.  He has no mania, suicidality, psychosis or delirium.  Depression The patient presents withdepressionasa recurrentproblemwithcurrent episode startingmore than7 weeksago. The onset quality is sudden. The problem occurs intermittently.The problemis newly 3 months post partum.Associated symptoms include decreased concentration,fatigue, low energy,body aches,lactation, and  joyful bonding with the baby's development, Associated symptoms includenochange inparenting,noself-harm,nomisperceptions,no psychosis,no decreased interest, no irritability, no helpless,no hopeless,and nosuicidality.The symptoms are aggravated byexpectation forwork or loss of such, medication, social issues and family issues.Past treatments includeSSRIs, other medications, and psychotherapy.Compliance with treatment ispoor tovariable.Past compliance problems includesocial anxiety,medical issues, difficulty with treatment plan and medication issues.Previous treatment provided moderaterelief.Risk factors include family history, a change in medication usage/dosage, the patient not taking medications correctly, emotional abuse, prior traumatic experience, prior psychiatric admission, major life event, family history of mental illness, history of mental illness, history of self-injury and history of suicide attempt. Past medical history includes thyroid problem,recent illness,anxiety,depression,mental health disorder,schizophreniaand suicide attempts. Pertinent negatives include no chronic pain,no terminal illness,no  bipolar disorder,no eating disorder,no obsessive-compulsive disorder,no post-traumatic stress disorderand no head trauma.  Individual Medical History/ Review of Systems: Changes? :Yes Weight down 6 pounds in 3 weeks breast-feeding with change in rate and quantity of milk production being less now  with the addition of Latuda 20 mg and Wellbutrin 150 mg XL daily.  Allergies: Ibuprofen and Phenergan [promethazine hcl]  Current Medications:  Current Outpatient Medications:  .  acetaminophen (TYLENOL) 500 MG tablet, Take 500 mg by mouth every 6 (six) hours as needed for mild pain., Disp: , Rfl:  .  aspirin 81 MG chewable tablet, Chew by mouth daily., Disp: , Rfl:  .  buPROPion (WELLBUTRIN XL) 150 MG 24 hr tablet, Take 1 tablet (150 mg total) by mouth daily after breakfast., Disp: 90 tablet, Rfl: 0 .  butalbital-acetaminophen-caffeine (FIORICET, ESGIC) 50-325-40 MG tablet, Take 1 tablet by mouth every 6 (six) hours as needed for headache., Disp: 12 tablet, Rfl: 5 .  DULoxetine (CYMBALTA) 60 MG capsule, Take 1 capsule (60 mg total) by mouth at bedtime., Disp: 90 capsule, Rfl: 1 .  lurasidone (LATUDA) 20 MG TABS tablet, Take 0.5 tablets (10 mg total) by mouth daily after supper., Disp: 28 tablet, Rfl: 0 .  Prenat w/o A-FeCbn-DSS-FA-DHA (PRENAISSANCE PLUS) 28-1-250 MG CAPS, Take 1 capsule by mouth daily., Disp: , Rfl:  .  promethazine (PHENERGAN) 12.5 MG tablet, Take 2 tablets (25 mg total) by mouth every 6 (six) hours as needed for nausea or vomiting., Disp: 40 tablet, Rfl: 3  Medication Side Effects: Slower and/or less volume to lactation though headache resolved   Family Medical/ Social History: Changes? No husband having seasonal mood changes  MENTAL HEALTH EXAM:  Height 5\' 4"  (1.626 m), weight 143 lb (64.9 kg), currently breastfeeding.Body mass index is 24.55 kg/m. Muscle strengths and tone 5/5, postural reflexes and gait 0/0, and AIMS = 0 otherwise deferred for coronavirus shutdown  General  Appearance: Casual, Guarded and Well Groomed  Eye Contact:  Good  Speech:  Clear and Coherent, Normal Rate and Talkative  Volume:  Normal  Mood:  Anxious, Dysphoric and Euthymic  Affect:  Congruent, Depressed, Inappropriate, Full Range and Anxious  Thought Process:  Coherent, Goal Directed, Irrelevant and Descriptions of Associations: Tangential  Orientation:  Full (Time, Place, and Person)  Thought Content: Rumination and Tangential   Suicidal Thoughts:  No  Homicidal Thoughts:  No  Memory:  Immediate;   Good Remote;   Good  Judgement:  Good  Insight:  Good  Psychomotor Activity:  Normal and Mannerisms  Concentration:  Concentration: Good and Attention Span: Good  Recall:  Good  Fund of Knowledge: Good  Language: Good  Assets:  Desire for Improvement Intimacy Resilience Vocational/Educational and Social Support  ADL's:  Intact  Cognition: WNL  Prognosis:  Good    DIAGNOSES:    ICD-10-CM   1. Mild recurrent major depression ([place])  F33.0   2. Social anxiety disorder  F40.10   3. Panic disorder  F41.0   4. Generalized anxiety disorder  F41.1   5. Severe recurrent major depression without psychotic features ([place])  F33.2 lurasidone (LATUDA) 20 MG TABS tablet    Receiving Psychotherapy: No    RECOMMENDATIONS: Patient effectively collaborates in mobilization of decision making in the course of the session, particularly referable to stress of breast milk quantity and rate of production being somewhat less than before, most likely more due to [place] than Wellbutrin. Though we discussed option of switching to Risperdal, the robust mood improvement on Latuda and its high-protein binding favor a reduced dose of Latuda by 50%. Samples were given #28 as she has not attempted to fill the 90-day supply at [facility] though the pharmacist indicated the cost would still be substantial  despite the coupon which has been overlooked 2 sessions ago as husband had experienced  the expense of being on Latuda previously as doable, changing directions to one half of a 20 mg tablet every evening after supper life in the next 2 months. For major depression. She has adequate supply into June for the Cymbalta 60 mg every bedtime for social and panic anxiety and major depression. She has adequate supply of Wellbutrin 150 mg XL every morning after breakfast the last till June for major depression and generalized anxiety. She returns for follow-up in 7 weeks or sooner if needed seeing the surgeon in 2 days about gallbladder now having no headaches.   Delight Hoh, MD

## 2020-02-21 ENCOUNTER — Encounter: Payer: No Typology Code available for payment source | Admitting: Physical Therapy

## 2020-02-22 ENCOUNTER — Ambulatory Visit: Payer: No Typology Code available for payment source | Attending: Family Medicine | Admitting: Physical Therapy

## 2020-02-22 ENCOUNTER — Telehealth: Payer: Self-pay | Admitting: Physical Therapy

## 2020-02-22 DIAGNOSIS — M6281 Muscle weakness (generalized): Secondary | ICD-10-CM | POA: Insufficient documentation

## 2020-02-22 DIAGNOSIS — R278 Other lack of coordination: Secondary | ICD-10-CM | POA: Insufficient documentation

## 2020-02-22 NOTE — Telephone Encounter (Signed)
PT left message for Pt to call clinic.  She missed her PT appointment on 02/22/20 at 8:45am.  Need to confirm next visit with her when she calls back. Loy Mccartt, PT 02/22/20 9:03 AM

## 2020-02-28 ENCOUNTER — Encounter: Payer: No Typology Code available for payment source | Admitting: Physical Therapy

## 2020-02-29 ENCOUNTER — Other Ambulatory Visit: Payer: Self-pay

## 2020-02-29 ENCOUNTER — Ambulatory Visit (INDEPENDENT_AMBULATORY_CARE_PROVIDER_SITE_OTHER): Payer: No Typology Code available for payment source | Admitting: Family Medicine

## 2020-02-29 ENCOUNTER — Encounter: Payer: Self-pay | Admitting: Family Medicine

## 2020-02-29 VITALS — BP 102/72 | HR 70 | Ht 64.0 in | Wt 148.0 lb

## 2020-02-29 DIAGNOSIS — R0781 Pleurodynia: Secondary | ICD-10-CM

## 2020-02-29 DIAGNOSIS — M999 Biomechanical lesion, unspecified: Secondary | ICD-10-CM | POA: Diagnosis not present

## 2020-02-29 NOTE — Progress Notes (Signed)
[facility] [place] Phone: [phone] Subjective:   Julie Webb, am serving as a scribe for Dr. Hulan Saas. This visit occurred during the SARS-CoV-2 public health emergency.  Safety protocols were in place, including screening questions prior to the visit, additional usage of staff PPE, and extensive cleaning of exam room while observing appropriate contact time as indicated for disinfecting solutions.   I'm seeing this patient by the request  of:  Debbrah Alar, NP  CC: Rib pain, headache follow-up  [id number]  Julie Webb is a 29 y.o. female coming in with complaint of back pain. Last seen on 01/18/2020 for OMT. Patient states having some mild pain at the moment but continues to make improvement.  Think she is making progress.  States that when she only works multiple shifts in a row sometimes can have some mild increase in discomfort.    Past Medical History:  Diagnosis Date  . Anxiety   . Depression   . Glomerulonephritis, poststreptococcal    age 52  . 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 Migraines   . Vaginal Pap smear, abnormal    Past Surgical History:  Procedure Laterality Date  . Webb PAST SURGERIES     Social History   Socioeconomic History  . Marital status: Married    Spouse name: Not on file  . Number of children: Not on file  . Years of education: Not on file  . Highest education level: Not on file  Occupational History  . Not on file  Tobacco Use  . Smoking status: Never Smoker  . Smokeless tobacco: Never Used  Substance and Sexual Activity  . Alcohol use: Not Currently    Comment: occasionally- once a month if then pt reports  . Drug use: Webb  . Sexual activity: Yes    Birth control/protection: Condom  Other Topics Concern  . 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 Difficulty of Paying Living Expenses:   Food Insecurity:   . Worried About Charity fundraiser in the Last Year:   . Arboriculturist in the Last Year:   Transportation Needs:   . Film/video editor (Medical):   Marland Kitchen Lack of Transportation (Non-Medical):   Physical Activity:   . Days of Exercise per Week:   . Minutes of Exercise per Session:   Stress:   . Feeling of Stress :   Social Connections:   . Frequency of Communication with Friends and Family:   . Frequency of Social Gatherings with Friends and Family:   . Attends Religious Services:   . Active Member of Clubs or Organizations:   . Attends Archivist Meetings:   Marland Kitchen Marital Status:    Allergies  Allergen Reactions  . Ibuprofen Other (See Comments)    Kidney failure  . Phenergan [Promethazine Hcl] Other (See Comments)    Restlessness w/ IV Phenergen. Probable mild EPS. Tolerates if taken w/ Benadryl.    Family History  Problem Relation Age of Onset  . Hyperlipidemia Mother   . Mental illness Mother   . Fibroids Mother        uterine  .  Hypertension Father   . Heart attack Father   . Heart disease Maternal Grandmother   . Hyperlipidemia Maternal Grandmother   . Hypertension Maternal Grandmother   . Heart disease Maternal Grandfather   . Hyperlipidemia Maternal Grandfather   . Hypertension Maternal Grandfather   . Pancreatic cancer Paternal Grandmother   . Mesothelioma Paternal Grandfather       Current Outpatient Medications (Respiratory):  .  promethazine (PHENERGAN) 12.5 MG tablet, Take 2 tablets (25 mg total) by mouth every 6 (six) hours as needed for nausea or vomiting.  Current Outpatient Medications (Analgesics):  .  acetaminophen (TYLENOL) 500 MG tablet, Take 500 mg by mouth every 6 (six) hours as needed for mild pain. Marland Kitchen  aspirin 81 MG chewable tablet, Chew by mouth  daily. .  butalbital-acetaminophen-caffeine (FIORICET, ESGIC) 50-325-40 MG tablet, Take 1 tablet by mouth every 6 (six) hours as needed for headache.   Current Outpatient Medications (Other):  Marland Kitchen  buPROPion (WELLBUTRIN XL) 150 MG 24 hr tablet, Take 1 tablet (150 mg total) by mouth daily after breakfast. .  DULoxetine (CYMBALTA) 60 MG capsule, Take 1 capsule (60 mg total) by mouth at bedtime. Marland Kitchen  lurasidone (LATUDA) 20 MG TABS tablet, Take 0.5 tablets (10 mg total) by mouth daily after supper. .  Prenat w/o A-FeCbn-DSS-FA-DHA (PRENAISSANCE PLUS) 28-1-250 MG CAPS, Take 1 capsule by mouth daily.   Reviewed prior external information including notes and imaging from  primary care provider As well as notes that were available from care everywhere and other healthcare systems.  Past medical history, social, surgical and family history all reviewed in electronic medical record.  Webb pertanent information unless stated regarding to the chief complaint.   Review of Systems:  Webb headache, visual changes, nausea, vomiting, diarrhea, constipation, dizziness, abdominal pain, skin rash, fevers, chills, night sweats, weight loss, swollen lymph nodes, body aches, joint swelling, chest pain, shortness of breath, mood changes. POSITIVE muscle aches  Objective  Blood pressure 102/72, pulse 70, height 5\' 4"  (1.626 m), weight 148 lb (67.1 kg), SpO2 97 %, currently breastfeeding.   General: Webb apparent distress alert and oriented x3 mood and affect normal, dressed appropriately.  HEENT: Pupils equal, extraocular movements intact  Respiratory: Patient's speak in full sentences and does not appear short of breath  Cardiovascular: Webb lower extremity edema, non tender, Webb erythema  Neuro: Cranial nerves II through XII are intact, neurovascularly intact in all extremities with 2+ DTRs and 2+ pulses.  Gait normal with good balance and coordination.  MSK:  Non tender with full range of motion and good stability and  symmetric strength and tone of shoulders, elbows, wrist, hip, knee and ankles bilaterally.  Neck exam shows some mild loss of lordosis.  Tenderness to palpation in the parascapular region.  Patient does have some very mild scapular dyskinesis noted.  Lacks the last 2 degrees of range of motion in all planes minimally.  Some of it seems to be voluntary guarding.  Osteopathic findings C2 flexed rotated and side bent right T7 extended rotated and side bent right inhaled rib L4 flexed rotated and side bent right     Impression and Recommendations:     This case required medical decision making of moderate complexity. The above documentation has been reviewed and is accurate and complete Julie Pulley, DO       Note: This dictation was prepared with Dragon dictation along with smaller phrase technology. Any transcriptional errors that result from this process are unintentional.

## 2020-02-29 NOTE — Assessment & Plan Note (Signed)
Seems to be more of a symptom syndrome including chronic problem with exacerbation.  Continue on the Cymbalta.  Discussed core stability and strengthening.  Discussed icing regimen.  Topical anti-inflammatories.  Follow-up again in 4 to

## 2020-02-29 NOTE — Assessment & Plan Note (Signed)
   Decision today to treat with OMT was based on Physical Exam  After verbal consent patient was treated with HVLA, ME, FPR techniques in cervical, thoracic, rib,areas, all areas are chronic   Patient tolerated the procedure well with improvement in symptoms  Patient given exercises, stretches and lifestyle modifications  See medications in patient instructions if given  Patient will follow up in 4-8 weeks 

## 2020-02-29 NOTE — Patient Instructions (Signed)
See me again in 7 weeks

## 2020-03-08 ENCOUNTER — Other Ambulatory Visit: Payer: Self-pay

## 2020-03-08 ENCOUNTER — Ambulatory Visit: Payer: No Typology Code available for payment source | Admitting: Physical Therapy

## 2020-03-08 ENCOUNTER — Encounter: Payer: Self-pay | Admitting: Physical Therapy

## 2020-03-08 DIAGNOSIS — M6281 Muscle weakness (generalized): Secondary | ICD-10-CM

## 2020-03-08 DIAGNOSIS — R278 Other lack of coordination: Secondary | ICD-10-CM

## 2020-03-08 NOTE — Therapy (Signed)
[facility] [address] [place], [place], [address] Phone: [phone]   Fax:  [phone]  Physical Therapy Treatment  Patient Details  Name: Julie Webb MRN: [id number] Date of Birth: Feb 04, 1991 Referring Provider (PT): Lyndal Pulley, DO   Encounter Date: 03/08/2020  PT End of Session - 03/08/20 1707    Visit Number  2    Number of Visits  8    Date for PT Re-Evaluation  04/04/20    Authorization Type  [facility] FOCUS    PT Start Time  1615    PT Stop Time  1703    PT Time Calculation (min)  48 min    Activity Tolerance  Patient tolerated treatment well    Behavior During Therapy  [facility] for tasks assessed/performed       Past Medical History:  Diagnosis Date  . Anxiety   . Depression   . Glomerulonephritis, poststreptococcal    age 29  . 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 Migraines   . Vaginal Pap smear, abnormal     Past Surgical History:  Procedure Laterality Date  . NO PAST SURGERIES      There were no vitals filed for this visit.  Subjective Assessment - 03/08/20 1621    Subjective  Working on PF contractions with less pain with quick flicks.  Can't isolate PF without use of buttock clench.  I have tried the introitus massage and it doesn't seem to be that tender.    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      Neuro Re-ed    Neuro Re-ed Details   signif time spent finding best cues to connect with TrA      Lumbar Exercises: Supine   Clam Limitations  bent knee controlled "fall out" with TrA and  level pelvis focus x 5 each side, slow    Heel Slides  5 reps    Heel Slides Limitations  bil, slow for control      Lumbar Exercises: Sidelying   Other Sidelying Lumbar Exercises  TrA contraction "lower ab smile" 5x10 sec holds with pelvis stacked      Lumbar Exercises: Quadruped   Other Quadruped Lumbar Exercises  TrA 5x10 sec             PT Education - 03/08/20 1704    Education Details  Access Code: [email or web address]    Person(s) Educated  Patient    Methods  Explanation;Demonstration;Verbal cues;Handout    Comprehension  Verbalized understanding;Returned demonstration       PT Short Term Goals - 03/08/20 1712      PT SHORT TERM GOAL #1   Title  Pt will demo ability to isolate proper contraction of pelvic floor.    Status  On-going      PT SHORT TERM GOAL #2   Title  Pt will learn and implement introitus massage and stretching to improve tolerance of vaginal penetration.  Status  Achieved        PT Long Term Goals - 02/08/20 1305      PT LONG TERM GOAL #1   Title  Pt will be ind with advanced HEP and understand how to safely progress    Baseline  -    Time  8    Period  Weeks    Status  New    Target Date  04/04/20      PT LONG TERM GOAL #2   Title  Pt will achieve at least 2/5 pelvic floor strength and be able to perform 10 quick flicks without pain to reduce occurence of unwanted gas and urinary leakage.    Baseline  -    Time  8    Period  Weeks    Status  New    Target Date  04/04/20      PT LONG TERM GOAL #3   Title  Pt will demo proper body mechanics with breath/core control for bending/lifting to reduce strain on pelvic floor with newborn care tasks and work tasks in ICU    Baseline  -    Time  8    Period  Weeks    Status  New    Target Date  04/04/20      PT LONG TERM GOAL #4   Title  Pt will score 0 or 1 on Mar[place]ff schedule to demo improved tolerance and reduced pain with vaginal penetration.    Baseline  -    Time  8    Period  Weeks     Status  New    Target Date  04/04/20      PT LONG TERM GOAL #5   Title  Pt will achieve at least 4+/5 trunk and LE strength for improved performance of bend/lift/squat for work tasks.    Baseline  -    Time  8    Period  Weeks    Status  New    Target Date  04/04/20            Plan - 03/08/20 1708    Clinical Impression Statement  Pt returns to clinic for first follow up visit.  She has tried introitus stretching a few times and didn't notice tenderness anymore.  She reports she is having difficulty isolating PF without glut muscles.  Reduced pain with quick flicks.  Her biggest symptom of bother is feeling like her "stomach is falling out."  Session focused today on recruitment of TrA in various positions inlcuding SL, quadruped, sitting and with functional squat.  Best cue is "make your lower abs smile."  PT progressed HEP for TrA contracitons and contractions with LE movement and functional squat.  She did not notice co-cueing of PF with TrA.  PT will focus on re-assessment of PF contraction next visit.    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    Stability/Clinical Decision Making  Stable/Uncomplicated    Rehab Potential  Good    PT Frequency  1x / week    PT Duration  8 weeks    PT Treatment/Interventions  ADLs/Self Care Home Management;Biofeedback;Cryotherapy;Electrical Stimulation;Moist Heat;Neuromuscular re-education;Balance training;Therapeutic exercise;Functional mobility training;Patient/family education;Manual techniques;Scar mobilization;Joint Manipulations;Spinal Manipulations    PT Next Visit Plan  f/u on TrA and HEP updates, internal re-assessment for pain/strength/re-ed, f/u on constipation and go over toileting tech and bowel routine as needed  PT Home Exercise Plan  Access Code: [email or web address] + (abdominal massage + introitus massage/stretching no dilator)     Consulted and Agree with Plan of Care  Patient       Patient will benefit from skilled therapeutic intervention in order to improve the following deficits and impairments:     Visit Diagnosis: Muscle weakness (generalized)  Other lack of coordination     Problem List Patient Active Problem List   Diagnosis Date Noted  . Nonallopathic lesion of cervical region 12/08/2019  . Nonallopathic lesion of thoracic region 12/08/2019  . Encounter for planned induction of labor 11/23/2019  . Nonallopathic lesion of rib cage 10/08/2019  . Rib pain on right side 10/07/2019  . 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 Panic disorder 11/01/2018  . Generalized anxiety disorder 11/01/2018  . Chronic migraine 12/25/2017  . Suicidal behavior with attempted self-injury ([place])   . Mild recurrent major depression ([place]) 12/08/2016  . Tremor 10/05/2015  . Loss of weight 07/06/2015  . Social anxiety disorder 05/30/2015  . H/O acute post-streptococcal glomerulonephritis 05/30/2015  . Contraceptive management 05/30/2015  . Migraine 05/30/2015    Baruch Merl, PT 03/08/20 5:18 PM   [facility] [address], [place], [place], [address] Phone: [phone]   Fax:  [phone]  Name: ARVETA SCHLOSSER MRN: [id number] Date of Birth: 1991/09/05

## 2020-03-08 NOTE — Patient Instructions (Signed)
Access Code: [email or web address] URL: [email or web address]: 04/21/2021Prepared by: Venetia Night BeuhringExercises  Supine Pelvic Floor Contract and Release - 3 x daily - 7 x weekly - 1 sets - 10 reps - 5 hold  Sidelying Pelvic Floor Contraction with Self-Palpation - 1 x daily - 7 x weekly - 1 sets - 10 reps - 5 hold  Sidelying Transversus Abdominis Bracing - 1 x daily - 7 x weekly - 1 sets - 10 reps - 10 hold  Quadruped Transversus Abdominis Bracing - 1 x daily - 7 x weekly - 1 sets - 10 reps - 10 hold  Hooklying Isometric Hip Flexion - 1 x daily - 7 x weekly - 1 sets - 5 reps - 5 hold  Supine Transversus Abdominis Bracing with Heel Slide - 1 x daily - 7 x weekly - 1 sets - 10 reps  Squat with Chair Touch - 1 x daily - 7 x weekly - 2 sets - 10 reps

## 2020-03-15 ENCOUNTER — Ambulatory Visit: Payer: No Typology Code available for payment source | Admitting: Physical Therapy

## 2020-03-21 ENCOUNTER — Other Ambulatory Visit: Payer: Self-pay

## 2020-03-21 ENCOUNTER — Encounter: Payer: Self-pay | Admitting: Physical Therapy

## 2020-03-21 ENCOUNTER — Ambulatory Visit: Payer: No Typology Code available for payment source | Attending: Family Medicine | Admitting: Physical Therapy

## 2020-03-21 DIAGNOSIS — R278 Other lack of coordination: Secondary | ICD-10-CM | POA: Insufficient documentation

## 2020-03-21 DIAGNOSIS — R293 Abnormal posture: Secondary | ICD-10-CM | POA: Diagnosis present

## 2020-03-21 DIAGNOSIS — M6281 Muscle weakness (generalized): Secondary | ICD-10-CM | POA: Diagnosis not present

## 2020-03-21 MED FILL — DULoxetine HCL 60 MG CPEP: 60 | 90 days supply | Qty: 90 | Fill #1

## 2020-03-21 NOTE — Therapy (Signed)
[facility] [address] [place], [place], [address] Phone: [phone]   Fax:  [phone]  Physical Therapy Treatment  Patient Details  Name: Julie Webb MRN: [id number] Date of Birth: 25-May-1991 Referring Provider (PT): Lyndal Pulley, DO   Encounter Date: 03/21/2020  PT End of Session - 03/21/20 1232    Visit Number  3    Number of Visits  8    Date for PT Re-Evaluation  04/04/20    Authorization Type  [facility] FOCUS    PT Start Time  1150    PT Stop Time  1230    PT Time Calculation (min)  40 min    Activity Tolerance  Patient tolerated treatment well    Behavior During Therapy  [facility] for tasks assessed/performed       Past Medical History:  Diagnosis Date  . Anxiety   . Depression   . Glomerulonephritis, poststreptococcal    age 29  . 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 Migraines   . Vaginal Pap smear, abnormal     Past Surgical History:  Procedure Laterality Date  . NO PAST SURGERIES      There were no vitals filed for this visit.  Subjective Assessment - 03/21/20 1153    Subjective  I haven't been doing the exercises.  I have been very busy with my son.  It has been recommended for me to have my gall bladder removed but I won't be able to do it until Oct due to having to take 2 weeks off from work.  Pt reports increased control of abdominal wall and less like stomach is falling out.  I was able to have intercourse without pain.  I still have some unwanted gas I can't control but no leakage.    Pertinent History  [place] RUQ: gallstones and sludge, referred for possible surgery, had PT at [facility] facility during pregnancy for Rt rib pain/DN which is better since delivery                        [facility] Adult PT Treatment/Exercise - 03/21/20 0001      Neuro Re-ed    Neuro Re-ed Details   SL pelvic stack, TrA with PF 5x10 sec holds, PT palpation through clothing at anal area to  monitor glut/PF coord/dissociation and VC  (Pended)       Exercises   Exercises  Lumbar;Shoulder  (Pended)       Lumbar Exercises: Supine   Bridge with [employer]  5 reps  (Pended)     Other Supine Lumbar Exercises  feet on red ball rolling in/out x 15 reps with core/PF awareness  (Pended)     Other Supine Lumbar Exercises  small ball knee adduction squeeze 3x10 with PF  (Pended)       Lumbar Exercises: Quadruped   Other Quadruped Lumbar Exercises  TrA with PF 3x10 sec holds, 10 quick flicks PF  (Pended)       Shoulder Exercises: Supine   Other Supine Exercises  extension bil shoulders to neutral from overhead with yellow band and core awareness  (Pended)                PT Short Term Goals - 03/21/20 1158      PT SHORT TERM GOAL #1   Title  Pt will demo ability to isolate proper contraction of pelvic floor.    Baseline  combined  with glut squeeze    Status  On-going      PT SHORT TERM GOAL #2   Title  Pt will learn and implement introitus massage and stretching to improve tolerance of vaginal penetration.    Status  Achieved      PT SHORT TERM GOAL #3   Title  Pt will learn bowel routine and toileting techniques to reduce incidence of constipation and reduce strain on pelvic floor with defecation.    Baseline  constipation is resolved    Status  Achieved        PT Long Term Goals - 03/21/20 1159      PT LONG TERM GOAL #1   Title  Pt will be ind with advanced HEP and understand how to safely progress    Status  On-going      PT LONG TERM GOAL #4   Title  Pt will score 0 or 1 on Marinoff schedule to demo improved tolerance and reduced pain with vaginal penetration.    Status  Achieved      PT LONG TERM GOAL #5   Title  Pt will achieve at least 4+/5 trunk and LE strength for improved performance of bend/lift/squat for work tasks.    Status  On-going            Plan - 03/21/20 1232    Clinical Impression Statement  Pt has not had time as a new mom to  do her HEP.  She does note improving abdominal strength but ongoing unwanted gas with fair control.  PT used VCs and neuro re-ed with external palpation around anal area for graded contractions starting with low grade "pick up a blueberry" and elevator floor cues to isolate PF contraction without glut activation.  Pt was able to contract PF with varied strength and no overuse of accessory muscles.  She is able to do quick flicks without pain.  She has been able to have intercourse for the first time without pain since delivery of her son.  She is quick to fatigue in both PF and TrA but is showing improved recruitment patterns.  PT encouraged compliance with PF contracitons thorughout day and did not progress HEP due to lack of compliance with initial program.  Pt will continue to benefit from skilled progression for core stabilization and strength following delivery of her son.    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    PT Frequency  1x / week    PT Duration  8 weeks    PT Treatment/Interventions  ADLs/Self Care Home Management;Biofeedback;Cryotherapy;Electrical Stimulation;Moist Heat;Neuromuscular re-education;Balance training;Therapeutic exercise;Functional mobility training;Patient/family education;Manual techniques;Scar mobilization;Joint Manipulations;Spinal Manipulations    PT Next Visit Plan  f/u on PF contractions throughout day, continue TrA/PF co-cueing, add upper body ther ex for work demands    PT Home Exercise Plan  Access Code: [email or web address] + (abdominal massage + introitus massage/stretching no dilator)    Consulted and Agree with Plan of Care  Patient       Patient will benefit from skilled therapeutic intervention in order to improve the following deficits and impairments:     Visit Diagnosis: Muscle weakness (generalized)  Other lack of coordination  Abnormal  posture     Problem List Patient Active Problem List   Diagnosis Date Noted  . Nonallopathic lesion of cervical region 12/08/2019  . Nonallopathic lesion of thoracic region 12/08/2019  . Encounter for  planned induction of labor 11/23/2019  . Nonallopathic lesion of rib cage 10/08/2019  . Rib pain on right side 10/07/2019  . 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 Panic disorder 11/01/2018  . Generalized anxiety disorder 11/01/2018  . Chronic migraine 12/25/2017  . Suicidal behavior with attempted self-injury ([place])   . 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 Tremor 10/05/2015  . Loss of weight 07/06/2015  . Social anxiety disorder 05/30/2015  . H/O acute post-streptococcal glomerulonephritis 05/30/2015  . Contraceptive management 05/30/2015  . Migraine 05/30/2015    Baruch Merl, PT 03/21/20 12:39 PM   [facility] [address], [place], [place], [address] Phone: [phone]   Fax:  [phone]  Name: Julie Webb MRN: [id number] Date of Birth: 1990/12/27

## 2020-03-27 ENCOUNTER — Ambulatory Visit: Payer: No Typology Code available for payment source | Admitting: Physical Therapy

## 2020-03-27 ENCOUNTER — Encounter: Payer: Self-pay | Admitting: Physical Therapy

## 2020-03-27 ENCOUNTER — Other Ambulatory Visit: Payer: Self-pay

## 2020-03-27 DIAGNOSIS — M6281 Muscle weakness (generalized): Secondary | ICD-10-CM | POA: Diagnosis not present

## 2020-03-27 DIAGNOSIS — R293 Abnormal posture: Secondary | ICD-10-CM

## 2020-03-27 DIAGNOSIS — R278 Other lack of coordination: Secondary | ICD-10-CM

## 2020-03-27 NOTE — Patient Instructions (Signed)
Access Code: [email or web address] URL: [email or web address]: 06/08/2021Prepared by: Venetia Night BeuhringExercises  Supine Pelvic Floor Contract and Release - 3 x daily - 7 x weekly - 1 sets - 10 reps - 5 hold  Sidelying Pelvic Floor Contraction with Self-Palpation - 1 x daily - 7 x weekly - 1 sets - 10 reps - 5 hold  Sidelying Transversus Abdominis Bracing - 1 x daily - 7 x weekly - 1 sets - 10 reps - 10 hold  Quadruped Transversus Abdominis Bracing - 1 x daily - 7 x weekly - 1 sets - 10 reps - 10 hold  Hooklying Isometric Hip Flexion - 1 x daily - 7 x weekly - 1 sets - 5 reps - 5 hold  Supine Transversus Abdominis Bracing with Heel Slide - 1 x daily - 7 x weekly - 1 sets - 10 reps  Squat with Chair Touch - 1 x daily - 7 x weekly - 2 sets - 10 reps  Seated March with Resistance - 1 x daily - 7 x weekly - 2 sets - 20 reps  Clamshell with Resistance - 1 x daily - 7 x weekly - 3 sets - 5 reps

## 2020-03-27 NOTE — Therapy (Signed)
[facility] [address], [place], [place], [address] Phone: [phone]   Fax:  [phone]  Physical Therapy Treatment  Patient Details  Name: Julie Webb MRN: [id number] Date of Birth: Dec 02, 1990 Referring Provider (PT): Lyndal Pulley, DO   Encounter Date: 03/27/2020  PT End of Session - 03/27/20 1324    Visit Number  4    Number of Visits  8    Date for PT Re-Evaluation  04/04/20    Authorization Type  [facility] FOCUS    PT Start Time  1245   Pt late   PT Stop Time  1323    PT Time Calculation (min)  38 min    Activity Tolerance  Patient tolerated treatment well    Behavior During Therapy  [facility] for tasks assessed/performed       Past Medical History:  Diagnosis Date  . Anxiety   . Depression   . Glomerulonephritis, poststreptococcal    age 29  . 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 Migraines   . Vaginal Pap smear, abnormal     Past Surgical History:  Procedure Laterality Date  . NO PAST SURGERIES      There were no vitals filed for this visit.  Subjective Assessment - 03/27/20 1246    Subjective  I am getting stronger.  Doing well with PF contraction isolation.    Pertinent History  [place] RUQ: gallstones and sludge, referred for possible surgery, had PT at [facility] facility during pregnancy for Rt rib pain/DN which is better since delivery    Diagnostic tests  [place] RUQ: gallstones and sludge, referred for possible surgery, follow up with surgeon is next week    Patient Stated Goals  get stronger    Currently in Pain?  No/denies                        [facility] Adult PT Treatment/Exercise - 03/27/20 0001      Exercises   Exercises  Knee/Hip      Lumbar Exercises: Sidelying   Other Sidelying Lumbar Exercises  ball squeeze with LAQ with core co-cueing 2x5 bil    Other Sidelying Lumbar Exercises  clam red x 10 bil      Lumbar Exercises: Quadruped   Single Arm Raise  5  reps;Left;Right    Single Arm Raises Limitations  single leg raise x 5 Rt/Lt    Plank  attempted on elbows but pt not ready    Other Quadruped Lumbar Exercises  TrA with PF 5x10 sec co-contraction    Other Quadruped Lumbar Exercises  alt row 4lb with core contraction, slow for control 2x5 each      Knee/Hip Exercises: Seated   Marching  Strengthening;Left;Right;20 reps;Weights    Marching Limitations  4lb bil LEs    Sit to Sand  10 reps;without UE support   holding 4lb at chest            PT Education - 03/27/20 1323    Education Details  Access Code: [email or web address]    Person(s) Educated  Patient    Methods  Explanation;Demonstration;Verbal cues;Handout;Tactile cues    Comprehension  Verbalized understanding;Returned demonstration       PT Short Term Goals - 03/21/20 1158      PT SHORT TERM GOAL #1   Title  Pt will demo ability to isolate proper contraction of pelvic floor.    Baseline  combined with glut squeeze  Status  On-going      PT SHORT TERM GOAL #2   Title  Pt will learn and implement introitus massage and stretching to improve tolerance of vaginal penetration.    Status  Achieved      PT SHORT TERM GOAL #3   Title  Pt will learn bowel routine and toileting techniques to reduce incidence of constipation and reduce strain on pelvic floor with defecation.    Baseline  constipation is resolved    Status  Achieved        PT Long Term Goals - 03/21/20 1159      PT LONG TERM GOAL #1   Title  Pt will be ind with advanced HEP and understand how to safely progress    Status  On-going      PT LONG TERM GOAL #4   Title  Pt will score 0 or 1 on Marinoff schedule to demo improved tolerance and reduced pain with vaginal penetration.    Status  Achieved      PT LONG TERM GOAL #5   Title  Pt will achieve at least 4+/5 trunk and LE strength for improved performance of bend/lift/squat for work tasks.    Status  On-going            Plan - 03/27/20 1324     Clinical Impression Statement  PT advanced core in quadruped, sidelying and sitting today.  Pt with good midline abdominal tension in all ther ex.  Attempted prone on knees plank but Pt unable to properly support position so d/c'd.  Will try counter plank next visit to reduce gravity.  Pt is quick to fatigue with general extremity weakness but is able to maintain deep core co-cue of TrA and PF with low reps.  Good breath coordination so far.  Re-eval with anticipation of extension of PT due to several missed visits to date and Pt making good ongoing progress with room to grow.    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    PT Frequency  1x / week    PT Duration  8 weeks    PT Treatment/Interventions  ADLs/Self Care Home Management;Biofeedback;Cryotherapy;Electrical Stimulation;Moist Heat;Neuromuscular re-education;Balance training;Therapeutic exercise;Functional mobility training;Patient/family education;Manual techniques;Scar mobilization;Joint Manipulations;Spinal Manipulations    PT Next Visit Plan  re-eval, seated march with weights (4lb), tband shoulder ext and foam press seated, try counter plank, static mini-lunges, bent over row, review ther ex from last visit    PT Home Exercise Plan  Access Code: [email or web address] + (abdominal massage + introitus massage/stretching no dilator)    Consulted and Agree with Plan of Care  Patient       Patient will benefit from skilled therapeutic intervention in order to improve the following deficits and impairments:     Visit Diagnosis: Muscle weakness (generalized)  Other lack of coordination  Abnormal posture     Problem List Patient Active Problem List   Diagnosis Date Noted  . Nonallopathic lesion of cervical region 12/08/2019  . Nonallopathic lesion of thoracic region 12/08/2019  . Encounter for planned induction of labor 11/23/2019  .  Nonallopathic lesion of rib cage 10/08/2019  . Rib pain on right side 10/07/2019  . 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 Panic disorder 11/01/2018  . Generalized anxiety disorder 11/01/2018  . Chronic migraine 12/25/2017  . Suicidal behavior with attempted self-injury ([place])   . Mild recurrent major depression (  [place]) 12/08/2016  . Tremor 10/05/2015  . Loss of weight 07/06/2015  . Social anxiety disorder 05/30/2015  . H/O acute post-streptococcal glomerulonephritis 05/30/2015  . Contraceptive management 05/30/2015  . Migraine 05/30/2015    Baruch Merl, PT 03/27/20 1:28 PM   [facility] [address], [place], [place], [address] Phone: [phone]   Fax:  [phone]  Name: Julie Webb MRN: [id number] Date of Birth: 31-Oct-1990

## 2020-04-03 ENCOUNTER — Encounter: Payer: Self-pay | Admitting: Physical Therapy

## 2020-04-03 ENCOUNTER — Other Ambulatory Visit: Payer: Self-pay

## 2020-04-03 ENCOUNTER — Ambulatory Visit: Payer: No Typology Code available for payment source | Admitting: Physical Therapy

## 2020-04-03 DIAGNOSIS — M6281 Muscle weakness (generalized): Secondary | ICD-10-CM

## 2020-04-03 DIAGNOSIS — R293 Abnormal posture: Secondary | ICD-10-CM

## 2020-04-03 DIAGNOSIS — R278 Other lack of coordination: Secondary | ICD-10-CM

## 2020-04-03 NOTE — Therapy (Addendum)
[facility] [address] [place], [place], [address] Phone: [phone]   Fax:  [phone]  Physical Therapy Treatment  Patient Details  Name: Julie Webb MRN: [id number] Date of Birth: 14-Jan-1991 Referring Provider (PT): Lyndal Pulley, DO   Encounter Date: 04/03/2020   PT End of Session - 04/03/20 1233    Visit Number 5    Number of Visits 8    Date for PT Re-Evaluation 04/04/20    Authorization Type [facility] FOCUS    PT Start Time 1230    PT Stop Time 1312    PT Time Calculation (min) 42 min    Activity Tolerance Patient tolerated treatment well    Behavior During Therapy [facility] for tasks assessed/performed           Past Medical History:  Diagnosis Date  . Anxiety   . Depression   . Glomerulonephritis, poststreptococcal    age 29  . 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 Migraines   . Vaginal Pap smear, abnormal     Past Surgical History:  Procedure Laterality Date  . NO PAST SURGERIES      There were no vitals filed for this visit.   Subjective Assessment - 04/03/20 1233    Subjective I continue to feel stronger and more aware of my core.    Pertinent History [place] RUQ: gallstones and sludge, referred for possible surgery, had PT at [facility] facility during pregnancy for Rt rib pain/DN which is better since delivery    Diagnostic tests [place] RUQ: gallstones and sludge, referred for possible surgery, follow up with surgeon is next week    Patient Stated Goals get stronger    Currently in Pain? No/denies              [facility] PT Assessment - 04/03/20 0001      Assessment   Medical Diagnosis N81.89 (ICD-10-CM) - Pelvic floor weakness in female    Referring Provider (PT) Lyndal Pulley, DO    Onset Date/Surgical Date 11/23/19   generally weak all my life   Prior Therapy yes                         [facility] Adult PT Treatment/Exercise - 04/03/20 0001      Lumbar Exercises:  Aerobic   Recumbent Bike L2 x 6', PT present to review LTGs      Lumbar Exercises: Standing   Other Standing Lumbar Exercises marching on foam pad x 20, slow for control      Lumbar Exercises: Seated   Sit to Stand 10 reps    Sit to Stand Limitations holding 10lb, PT cued PF, TrA and exhale on stand    Other Seated Lumbar Exercises pelvic ROM on ball x 10 each: pelvic tilts and lifts, tail wag and circles both ways      Lumbar Exercises: Quadruped   Other Quadruped Lumbar Exercises prayer, prayer with SB 3 rounds 10 sec each      Knee/Hip Exercises: Seated   Ball Squeeze 5x5 sec with PF 50% cue    Marching Strengthening;Left;Right;20 reps;Weights    Marching Limitations 4lb bil LEs    Abduction/Adduction  Strengthening;Both;15 reps    Abd/Adduction Limitations red clam bil      Shoulder Exercises: Seated   Extension Strengthening;Both;Theraband;10 reps    Theraband Level (Shoulder Extension) Level 2 (Red)    Row Strengthening;Both;10 reps;Theraband    Theraband  Level (Shoulder Row) Level 2 (Red)    Flexion 10 reps;Weights    Flexion Weight (lbs) 2    Flexion Limitations 2x5, seated on green ball    Abduction Strengthening;Both;10 reps;Weights    ABduction Weight (lbs) 2    ABduction Limitations 2x5, seated on green ball                    PT Short Term Goals - 04/03/20 1318      PT SHORT TERM GOAL #1   Title Pt will demo ability to isolate proper contraction of pelvic floor.    Status Achieved      PT SHORT TERM GOAL #2   Title Pt will learn and implement introitus massage and stretching to improve tolerance of vaginal penetration.    Status Achieved      PT SHORT TERM GOAL #3   Title Pt will learn bowel routine and toileting techniques to reduce incidence of constipation and reduce strain on pelvic floor with defecation.    Status Achieved             PT Long Term Goals - 04/03/20 1234      PT LONG TERM GOAL #1   Title Pt will be ind with advanced  HEP and understand how to safely progress    Baseline progressing core and trunk strength, LE strength    Status On-going      PT LONG TERM GOAL #2   Title Pt will achieve at least 2/5 pelvic floor strength and be able to perform 10 quick flicks without pain to reduce occurence of unwanted gas and urinary leakage.    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    Status Achieved      PT LONG TERM GOAL #5   Title Pt will achieve at least 4+/5 trunk and LE strength for improved performance of bend/lift/squat for work tasks.    Status On-going                 Plan - 04/03/20 1313    Clinical Impression Statement Pt has met all STGs and is making good progress towards LTGs.  She is able to contract her PF with strength 3+/5.  She reports automatic recruitment of PF and deep core canister with UE/LE strength challenges.  Global body strength is 4/5.  She develops good midline tension along linea alba with single finger width diastatsis at umbilicus. PT is progressing core and pelvic stabilization with overlay of UE/LE strength challenges in multiple positions.  Pt needs short breaks and change up of muscle groups during session due to quick to fatigue muscle groups.  She needs minor VCs with some exercises for form.  She will continue to benefit from progression of dynamic strength and functional task strengthening 1x/week for 6 more weeks.    Comorbidities Rt upper quadrant pain (gall bladder Dx), vaginal delivery 11/23/19, anxiety/depression, work requires heavy lifting in patient  care in ICU    Rehab Potential Good  PT Frequency 1x / week    PT Duration 6 weeks    PT Treatment/Interventions ADLs/Self Care Home Management;Biofeedback;Cryotherapy;Electrical Stimulation;Moist Heat;Neuromuscular re-education;Balance training;Therapeutic exercise;Functional mobility training;Patient/family education;Manual techniques;Scar mobilization;Joint Manipulations;Spinal Manipulations    PT Next Visit Plan continue dynamic strengthening, core, try counter plank    PT Home Exercise Plan Access Code: [email or web address] + (abdominal massage + introitus massage/stretching no dilator)    Consulted and Agree with Plan of Care Patient           Patient will benefit from skilled therapeutic intervention in order to improve the following deficits and impairments:     Visit Diagnosis: Muscle weakness (generalized) - Plan: PT plan of care cert/re-cert  Other lack of coordination - Plan: PT plan of care cert/re-cert  Abnormal posture - Plan: PT plan of care cert/re-cert     Problem List Patient Active Problem List   Diagnosis Date Noted  . Nonallopathic lesion of cervical region 12/08/2019  . Nonallopathic lesion of thoracic region 12/08/2019  . Encounter for planned induction of labor 11/23/2019  . Nonallopathic lesion of rib cage 10/08/2019  . Rib pain on right side 10/07/2019  . 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 Panic disorder 11/01/2018  . Generalized anxiety disorder 11/01/2018  . Chronic migraine 12/25/2017  . Suicidal behavior with attempted self-injury ([place])   . 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 Tremor 10/05/2015  . Loss of weight 07/06/2015  . Social anxiety disorder 05/30/2015  . H/O acute post-streptococcal glomerulonephritis 05/30/2015  . Contraceptive management 05/30/2015  . Migraine 05/30/2015   Keishawna Carranza, PT 04/03/20 1:20 PM  PHYSICAL THERAPY DISCHARGE SUMMARY  Visits from Start of Care: 5  Current functional level  related to goals / functional outcomes: Met all STGs and met or was progressing well towards LTGs. Pt had a same day cancellation and a no show for last two visits, and with prior history of attendance, clinic had to d/c Pt due to cancellation policy.  Pt may return with a new PT order should she wish to receive more PT.   Remaining deficits: See above   Education / Equipment: HEP Plan: Patient agrees to discharge.  Patient goals were partially met. Patient is being discharged due to not returning since the last visit.  ?????         Baruch Merl, PT 04/26/20 6:00 AM   [facility] [address], [place], [place], [address] Phone: [phone]   Fax:  [phone]  Name: Julie Webb MRN: [id number] Date of Birth: Jun 04, 1991

## 2020-04-04 ENCOUNTER — Ambulatory Visit: Payer: No Typology Code available for payment source | Admitting: Psychiatry

## 2020-04-05 ENCOUNTER — Ambulatory Visit: Payer: No Typology Code available for payment source | Admitting: Psychiatry

## 2020-04-10 ENCOUNTER — Encounter: Payer: No Typology Code available for payment source | Admitting: Physical Therapy

## 2020-04-17 ENCOUNTER — Ambulatory Visit: Payer: No Typology Code available for payment source | Admitting: Physical Therapy

## 2020-04-17 ENCOUNTER — Ambulatory Visit (HOSPITAL_COMMUNITY)
Admission: EM | Admit: 2020-04-17 | Discharge: 2020-04-17 | Disposition: A | Discharge status: Home / Self Care | Payer: No Typology Code available for payment source | Attending: Family Medicine | Admitting: Family Medicine

## 2020-04-17 ENCOUNTER — Ambulatory Visit (INDEPENDENT_AMBULATORY_CARE_PROVIDER_SITE_OTHER)
Admission: RE | Admit: 2020-04-17 | Discharge: 2020-04-17 | Disposition: A | Discharge status: Other Acute Inpatient Hospital | Payer: No Typology Code available for payment source | Source: Ambulatory Visit

## 2020-04-17 ENCOUNTER — Encounter (HOSPITAL_COMMUNITY): Payer: Self-pay

## 2020-04-17 ENCOUNTER — Other Ambulatory Visit: Payer: Self-pay

## 2020-04-17 DIAGNOSIS — R519 Headache, unspecified: Secondary | ICD-10-CM

## 2020-04-17 DIAGNOSIS — G43019 Migraine without aura, intractable, without status migrainosus: Secondary | ICD-10-CM | POA: Diagnosis not present

## 2020-04-17 MED ORDER — KETOROLAC TROMETHAMINE 30 MG/ML IJ SOLN
30.0000 mg | Freq: Once | INTRAMUSCULAR | Status: AC
  Administered 2020-04-17: 30 mg via INTRAMUSCULAR

## 2020-04-17 MED ORDER — SUMATRIPTAN SUCCINATE 6 MG/0.5ML ~~LOC~~ SOLN
6.0000 mg | Freq: Once | SUBCUTANEOUS | Status: AC
  Administered 2020-04-17: 6 mg via SUBCUTANEOUS

## 2020-04-17 MED ORDER — DEXAMETHASONE SODIUM PHOSPHATE 10 MG/ML IJ SOLN
10.0000 mg | Freq: Once | INTRAMUSCULAR | Status: AC
  Administered 2020-04-17: 10 mg via INTRAMUSCULAR

## 2020-04-17 MED ORDER — DEXAMETHASONE SODIUM PHOSPHATE 10 MG/ML IJ SOLN
INTRAMUSCULAR | Status: AC
  Filled 2020-04-17: qty 1

## 2020-04-17 MED ORDER — KETOROLAC TROMETHAMINE 30 MG/ML IJ SOLN
INTRAMUSCULAR | Status: AC
  Filled 2020-04-17: qty 1

## 2020-04-17 MED ORDER — METOCLOPRAMIDE HCL 5 MG/ML IJ SOLN
INTRAMUSCULAR | Status: AC
  Filled 2020-04-17: qty 2

## 2020-04-17 MED ORDER — METOCLOPRAMIDE HCL 5 MG/ML IJ SOLN
5.0000 mg | Freq: Once | INTRAMUSCULAR | Status: AC
  Administered 2020-04-17: 5 mg via INTRAMUSCULAR

## 2020-04-17 MED ORDER — SUMATRIPTAN SUCCINATE 6 MG/0.5ML ~~LOC~~ SOLN
SUBCUTANEOUS | Status: AC
  Filled 2020-04-17: qty 0.5

## 2020-04-17 NOTE — Discharge Instructions (Addendum)
Go to the Emergency Department or Urgent Care for evaluation of your symptoms in person.

## 2020-04-17 NOTE — ED Triage Notes (Signed)
Patient presents today with a Migraine that began this morning when she woke up. Patient states she has taken/tried her prescribed medication with no relief - did a video w/UC and was referred here.

## 2020-04-17 NOTE — ED Provider Notes (Signed)
[place]   [id number] 04/17/20 Arrival Time: 7017  ASSESSMENT & PLAN:  1. Intractable migraine without aura and without status migrainosus     Given: Meds ordered this encounter  Medications  . ketorolac (TORADOL) 30 MG/ML injection 30 mg  . metoCLOPramide (REGLAN) injection 5 mg  . dexamethasone (DECADRON) injection 10 mg  . SUMAtriptan (IMITREX) injection 6 mg    Normal neurological exam. Afebrile without nuchal rigidity. Discussed. Current presentation and symptoms are consistent with a migraine and are not consistent with SAH, ICH, meningitis, or temporal arteritis. No indication for urgent neurodiagnostic workup at this time; discussed possible need if she does not show improvement with above medications.  Recommend:  Follow-up Information    [place].   Specialty: Emergency Medicine Why: If symptoms worsen in any way or do not improve. Contact information: [address] [id number] [place] [phone]               Reviewed expectations re: course of current medical issues. Questions answered. Outlined signs and symptoms indicating need for more acute intervention. Patient verbalized understanding. After Visit Summary given.   SUBJECTIVE: History from: Julie Webb. Patient is able to give a clear and coherent history.  Julie Webb is a 29 y.o. female who presents with complaint of a migraine headache. H/O infrequent. Onset abrupt, upon waking today. Location: generalized but more prominent behind L eye without radiation. History of headaches: yes. Precipitating factors include: none which have been determined. Does report this is the worst migraine she has ever experienced. Associated symptoms: Preceding aura: no. Nausea/vomiting: significant nausea without vomiting. Increased sensitivity to light and to noises: yes, but has resolved after taking Fioricet. Fever:  no. Sinus pressure/congestion: no. Extremity weakness: no. Home treatment has included Fioricet with little improvement. Current headache has limited normal daily activities. Denies loss of balance, muscle weakness, numbness of extremities, speech difficulties and vision problems. Does feel lightheaded. No head injury reported. Ambulatory without difficulty. No recent travel.    OBJECTIVE:  Vitals:   04/17/20 1519  BP: 110/75  Pulse: 76  Resp: 16  Temp: 98.7 F (37.1 C)  TempSrc: Oral  SpO2: 99%    General appearance: alert; NAD but appears fatigued HENT: normocephalic; atraumatic Eyes: pupils appear normal; EOMI; conjunctivae normal Neck: supple with FROM Lungs: speaks full sentences without difficulty; unlabored respirations Extremities: no edema Skin: warm and dry Neurologic: without gross neurological abnormalities; alert; speech is fluent and clear without dysarthria or aphasia; no facial droop; normal extremity movement Psychological: alert and cooperative; normal mood and affect   Allergies  Allergen Reactions  . Ibuprofen Other (See Comments)    Kidney failure  . Phenergan [Promethazine Hcl] Other (See Comments)    Restlessness w/ IV Phenergen. Probable mild EPS. Tolerates if taken w/ Benadryl.     Past Medical History:  Diagnosis Date  . Anxiety   . Depression   . Glomerulonephritis, poststreptococcal    age 33  . 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 Migraines   . Vaginal Pap smear, abnormal    Social History   Socioeconomic History  . Marital status: Married    Spouse name: Not on file  . Number of children: Not on file  . Years of education: Not on file  . Highest education level: Not on file  Occupational History  . Not on file  Tobacco Use  . Smoking status: Never Smoker  . Smokeless tobacco:  Never Used  Vaping Use  . Vaping Use: Never used  Substance and Sexual Activity  . Alcohol use: Not Currently    Comment: occasionally-  once a month if then pt reports  . Drug use: No  . Sexual activity: Yes    Birth control/protection: Condom  Other Topics Concern  . 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 Difficulty of Paying Living Expenses:   Food Insecurity:   . Worried About Charity fundraiser in the Last Year:   . Arboriculturist in the Last Year:   Transportation Needs:   . Film/video editor (Medical):   Marland Kitchen Lack of Transportation (Non-Medical):   Physical Activity:   . Days of Exercise per Week:   . Minutes of Exercise per Session:   Stress:   . Feeling of Stress :   Social Connections:   . Frequency of Communication with Friends and Family:   . Frequency of Social Gatherings with Friends and Family:   . Attends Religious Services:   . Active Member of Clubs or Organizations:   . Attends Archivist Meetings:   Marland Kitchen Marital Status:   Intimate Partner Violence:   . Fear of Current or Ex-Partner:   . Emotionally Abused:   Marland Kitchen Physically Abused:   . Sexually Abused:    Family History  Problem Relation Age of Onset  . Hyperlipidemia Mother   . Mental illness Mother   . Fibroids Mother        uterine  . Hypertension Father   . Heart attack Father   . Heart disease Maternal Grandmother   . Hyperlipidemia Maternal Grandmother   . Hypertension Maternal Grandmother   . Heart disease Maternal Grandfather   . Hyperlipidemia Maternal Grandfather   . Hypertension Maternal Grandfather   . Pancreatic cancer Paternal Grandmother   . Mesothelioma Paternal Grandfather    Past Surgical History:  Procedure Laterality Date  . NO PAST SURGERIES       Julie Kick, MD 04/17/20 1606

## 2020-04-17 NOTE — ED Provider Notes (Signed)
Virtual Visit via Video Note:  Julie Webb  initiated request for Telemedicine visit with [facility] team. I connected with Shahd E Ozanich  on 04/17/2020 at 1:36 PM  for a synchronized telemedicine visit using a video enabled HIPPA compliant telemedicine application. I verified that I am speaking with Julie Webb  using two identifiers. Sharion Balloon, NP  was physically located in a [facility] Urgent care site and Steen E Padron was located at a different location.   The limitations of evaluation and management by telemedicine as well as the availability of in-person appointments were discussed. Patient was informed that she  may incur a bill ( including co-pay) for this virtual visit encounter. Julie Webb  expressed understanding and gave verbal consent to proceed with virtual visit.     History of Present Illness:Julie Webb  is a 29 y.o. female presents for evaluation of migraine headache today.  History of migraines.  States this was the worst headache she has ever had; 8-9/10.  Currently 4-5/10 after she took an old Archivist and a nap.  She reports blurred vision and dizziness also.  She denies fever, chills, weakness, numbness, chest pain, SOB, abdominal pain, or other symptoms.  She is currently breastfeeding.      Allergies  Allergen Reactions  . Ibuprofen Other (See Comments)    Kidney failure  . Phenergan [Promethazine Hcl] Other (See Comments)    Restlessness w/ IV Phenergen. Probable mild EPS. Tolerates if taken w/ Benadryl.      Past Medical History:  Diagnosis Date  . Anxiety   . Depression   . Glomerulonephritis, poststreptococcal    age 62  . 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 Migraines   . Vaginal Pap smear, abnormal      Social History   Tobacco Use  . Smoking status: Never Smoker  . Smokeless tobacco: Never Used  Vaping Use  . Vaping Use: Never used  Substance Use Topics  . Alcohol use: Not Currently    Comment: occasionally- once  a month if then pt reports  . Drug use: No    ROS: as stated in HPI.  All other systems reviewed and negative.     Observations/Objective: Physical Exam  VITALS: Patient denies fever. GENERAL: Alert, appears well and in no acute distress. HEENT: Atraumatic. Oral mucosa appears moist.  NECK: Normal movements of the head and neck. CARDIOPULMONARY: No increased WOB. Speaking in clear sentences. I:E ratio WNL.  MS: Moves all visible extremities without noticeable abnormality. PSYCH: Pleasant and cooperative, well-groomed. Speech normal rate and rhythm. Affect is appropriate. Insight and judgement are appropriate. Attention is focused, linear, and appropriate.  NEURO: CN grossly intact. Oriented as arrived to appointment on time with no prompting. Moves both UE equally.  SKIN: No obvious lesions, wounds, erythema, or cyanosis noted on face or hands.   Assessment and Plan:    ICD-10-CM   1. Acute nonintractable headache, unspecified headache type  R51.9        Follow Up Instructions: Discussed with patient that I cannot adequately evaluate her symptoms on a video visit.  Instructed her to go to the emergency department or urgent care.  Patient agrees to plan of care.  She is well-appearing and in no acute distress.      I discussed the assessment and treatment plan with the patient. The patient was provided an opportunity to ask questions and all were answered. The patient agreed with the  plan and demonstrated an understanding of the instructions.   The patient was advised to call back or seek an in-person evaluation if the symptoms worsen or if the condition fails to improve as anticipated.      Sharion Balloon, NP  04/17/2020 1:36 PM         Sharion Balloon, NP 04/17/20 1337

## 2020-04-18 ENCOUNTER — Ambulatory Visit: Payer: No Typology Code available for payment source | Admitting: Family Medicine

## 2020-04-18 NOTE — Progress Notes (Deleted)
[place] Phone: [phone] Subjective:    I'm seeing this patient by the request  of:  Debbrah Alar, NP  CC:   [id number]  Julie Webb is a 29 y.o. female coming in with complaint of back and neck pain. OMT 02/29/2020. Patient states   Medications patient has been prescribed:   Taking:         Reviewed prior external information including notes and imaging from previsou exam, outside providers and external EMR if available.   As well as notes that were available from care everywhere and other healthcare systems.  Past medical history, social, surgical and family history all reviewed in electronic medical record.  No pertanent information unless stated regarding to the chief complaint.   Past Medical History:  Diagnosis Date  . Anxiety   . Depression   . Glomerulonephritis, poststreptococcal    age 23  . 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 Migraines   . Vaginal Pap smear, abnormal     Allergies  Allergen Reactions  . Ibuprofen Other (See Comments)    Kidney failure  . Phenergan [Promethazine Hcl] Other (See Comments)    Restlessness w/ IV Phenergen. Probable mild EPS. Tolerates if taken w/ Benadryl.      Review of Systems:  No headache, visual changes, nausea, vomiting, diarrhea, constipation, dizziness, abdominal pain, skin rash, fevers, chills, night sweats, weight loss, swollen lymph nodes, body aches, joint swelling, chest pain, shortness of breath, mood changes. POSITIVE muscle aches  Objective  currently breastfeeding.   General: No apparent distress alert and oriented x3 mood and affect normal, dressed appropriately.  HEENT: Pupils equal, extraocular movements intact  Respiratory: Patient's speak in full sentences and does not appear short of breath  Cardiovascular: No lower extremity edema, non tender, no erythema  Neuro: Cranial nerves II through XII are  intact, neurovascularly intact in all extremities with 2+ DTRs and 2+ pulses.  Gait normal with good balance and coordination.  MSK:  Non tender with full range of motion and good stability and symmetric strength and tone of shoulders, elbows, wrist, hip, knee and ankles bilaterally.  Back - Normal skin, Spine with normal alignment and no deformity.  No tenderness to vertebral process palpation.  Paraspinous muscles are not tender and without spasm.   Range of motion is full at neck and lumbar sacral regions  Osteopathic findings  C2 flexed rotated and side bent right C6 flexed rotated and side bent left T3 extended rotated and side bent right inhaled rib T9 extended rotated and side bent left L2 flexed rotated and side bent right Sacrum right on right       Assessment and Plan:    Nonallopathic problems  Decision today to treat with OMT was based on Physical Exam  After verbal consent patient was treated with HVLA, ME, FPR techniques in cervical, rib, thoracic, lumbar, and sacral  areas  Patient tolerated the procedure well with improvement in symptoms  Patient given exercises, stretches and lifestyle modifications  See medications in patient instructions if given  Patient will follow up in 4-8 weeks      The above documentation has been reviewed and is accurate and complete Lyndal Pulley, DO       Note: This dictation was prepared with Dragon dictation along with smaller phrase technology. Any transcriptional errors that result from this process are unintentional.

## 2020-04-19 ENCOUNTER — Ambulatory Visit (INDEPENDENT_AMBULATORY_CARE_PROVIDER_SITE_OTHER): Payer: No Typology Code available for payment source | Admitting: Psychiatry

## 2020-04-19 ENCOUNTER — Other Ambulatory Visit: Payer: Self-pay

## 2020-04-19 ENCOUNTER — Encounter: Payer: Self-pay | Admitting: Psychiatry

## 2020-04-19 VITALS — Ht 64.0 in | Wt 144.0 lb

## 2020-04-19 DIAGNOSIS — F410 Panic disorder [episodic paroxysmal anxiety] without agoraphobia: Secondary | ICD-10-CM | POA: Diagnosis not present

## 2020-04-19 DIAGNOSIS — F411 Generalized anxiety disorder: Secondary | ICD-10-CM

## 2020-04-19 DIAGNOSIS — F4010 Social phobia, unspecified: Secondary | ICD-10-CM | POA: Diagnosis not present

## 2020-04-19 DIAGNOSIS — F330 Major depressive disorder, recurrent, mild: Secondary | ICD-10-CM

## 2020-04-19 MED ORDER — BUPROPION HCL ER (XL) 150 MG PO TB24: 150.0000 mg | ORAL_TABLET | Freq: Every day | ORAL | 1 refills | Status: DC

## 2020-04-19 MED ORDER — DULOXETINE HCL 60 MG PO CPEP: 60.0000 mg | ORAL_CAPSULE | Freq: Every day | ORAL | 1 refills | Status: DC

## 2020-04-19 MED FILL — buPROPion HCL ER (XL) 150 M: 150 | 90 days supply | Qty: 90 | Fill #0

## 2020-04-19 NOTE — Progress Notes (Signed)
[facility]  Patient ID: Julie Webb,  MRN: [id number]  PCP: Debbrah Alar, NP  Date of Evaluation: 04/19/2020 Time spent:20 minutes from 1325 to 1345  Chief Complaint:  Chief Complaint    Depression; Anxiety; Panic Attack      HISTORY/CURRENT STATUS: Julie Webb is seen onsite in office 20 minutes face-to-face individually with consent with epic collateral for psychiatric interview and exam in 69-month evaluation and management of postpartum major depression, social and generalized anxiety, and panic disorder around which she may most destabilized when it reaches out of control.  She may be particularly vulnerable back to work therefore depending upon husband with mental illness and extended family newly responsible to care for baby [name] of 7 months she is nursing.  She may be stressed by being on 3 medications while breast-feeding as Wellbutrin and Latuda were added 3 months ago to Cymbalta of pregnancy now starting solid foods frist mostly banana as the baby would jump in the uterus whenshe ate a banana.  [name] did not ingest much of the banana but greatly enjoyed it.  Mood has been stable with no psychotic symptoms including no suicidal ideation.  She processes husband's obtaining testosterone when all values have been declining treatment unless he lost weight and could utilize treatment more fully.  Now has been dependent resource for his testosterone.  She is facing migraines again acutely 2 days ago not otherwise having a lot of headaches.  She has no mania, suicidality, psychosis or delirium.   Depression The patient presents withdepressionasa recurrentproblemwithcurrent episode startingmore than 15monthsago. The onset quality is sudden. The problem occurs intermittently.The problemis now 7 months post partum.Associated symptoms include decreased concentration,easy fatigue,labile energy,easy worry, and body aches.  Associated symptoms  includenochange inparenting or lactation,noself-harm,nomisperceptions,no psychosis,no decreased interest, no irritability, nohelpless,nohopeless,no prepanic, andnosuicidality.The symptoms are aggravated byexpectation forwork or loss of such, medication, social issues and family issues.Past treatments includeSSRIs, other medications, and psychotherapy.Compliance with treatment ispoor tovariable.Past compliance problems includesocial anxiety,medical issues, difficulty with treatment plan and medication issues.Previous treatment provided moderaterelief.Risk factors include family history, a change in medication usage/dosage, the patient not taking medications correctly, emotional abuse, prior traumatic experience, prior psychiatric admission, major life event, family history of mental illness, history of mental illness, history of self-injury and history of suicide attempt. Past medical history includes thyroid problem,recent illness,anxiety,depression,mental health disorder,schizophreniaand suicide attempts. Pertinent negatives include no chronic pain,no terminal illness,no bipolar disorder,no eating disorder,no obsessive-compulsive disorder,no post-traumatic stress disorderand no head trauma.  Individual Medical History/ Review of Systems: Changes? :Yes Migraine treated in the ED was otherwise to evaluation and prevention.  She required Toradol, Zofran, Imitrex and Decadron in the ED for relief and expect she would restart verapamil for basilar migraine allergy if headaches continue.  Allergies: Ibuprofen and Phenergan [promethazine hcl]  Current Medications:  Current Outpatient Medications:  .  acetaminophen (TYLENOL) 500 MG tablet, Take 500 mg by mouth every 6 (six) hours as needed for mild pain., Disp: , Rfl:  .  aspirin 81 MG chewable tablet, Chew by mouth daily., Disp: , Rfl:  .  buPROPion (WELLBUTRIN XL) 150 MG 24 hr tablet, Take 1 tablet (150 mg  total) by mouth at bedtime., Disp: 90 tablet, Rfl: 1 .  butalbital-acetaminophen-caffeine (FIORICET, ESGIC) 50-325-40 MG tablet, Take 1 tablet by mouth every 6 (six) hours as needed for headache., Disp: 12 tablet, Rfl: 5 .  DULoxetine (CYMBALTA) 60 MG capsule, Take 1 capsule (60 mg total) by mouth at bedtime., Disp: 90 capsule, Rfl: 1 .  Prenat w/o A-FeCbn-DSS-FA-DHA (PRENAISSANCE PLUS) 28-1-250 MG CAPS, Take 1 capsule by mouth daily., Disp: , Rfl:   Medication Side Effects: none  Family Medical/ Social History: Changes? No  MENTAL HEALTH EXAM:  Height 5\' 4"  (1.626 m), weight 144 lb (65.3 kg), currently breastfeeding.Body mass index is 24.72 kg/m. Muscle strengths and tone 5/5, postural reflexes and gait 0/0, and AIMS = 0.  General Appearance: Casual, Meticulous and Well Groomed  Eye Contact:  Good  Speech:  Clear and Coherent, Normal Rate and Talkative  Volume:  Normal  Mood:  Anxious and Euthymic  Affect:  Congruent, Full Range and Anxious  Thought Process:  Coherent, Goal Directed and Descriptions of Associations: Tangential  Orientation:  Full (Time, Place, and Person)  Thought Content: Rumination and Tangential   Suicidal Thoughts:  No  Homicidal Thoughts:  No  Memory:  Immediate;   Good Remote;   Good  Judgement:  Good  Insight:  Good  Psychomotor Activity:  Normal and Mannerisms  Concentration:  Concentration: Good and Attention Span: Good  Recall:  Good  Fund of Knowledge: Good  Language: Good  Assets:  Desire for Improvement Intimacy Resilience Vocational/Educational  ADL's:  Intact  Cognition: WNL  Prognosis:  Good    DIAGNOSES:    ICD-10-CM   1. Mild recurrent major depression (HCC)  F33.0 buPROPion (WELLBUTRIN XL) 150 MG 24 hr tablet    DULoxetine (CYMBALTA) 60 MG capsule  2. Social anxiety disorder  F40.10 DULoxetine (CYMBALTA) 60 MG capsule  3. Generalized anxiety disorder  F41.1 buPROPion (WELLBUTRIN XL) 150 MG 24 hr tablet    DULoxetine (CYMBALTA) 60 MG  capsule  4. Panic disorder  F41.0 DULoxetine (CYMBALTA) 60 MG capsule    Receiving Psychotherapy: No    RECOMMENDATIONS: In partial closure to decompensation 3 months ago now 5 month depressive episode, Latuda is discontinued 10 mg every evening meal samples nearly exhausted.  Wellbutrin is continued 150 mg XL every bedtime now patient's change for compliance sent as #90 with 1 refill E scribed to [facility] for major depression and generalized anxiety.  She continues follow-up E scribed 60 mg every bedtime #90 with 1 refill sent to [facility] outpatient pharmacy for social, generalized and panic anxiety and major depression.  She returns for follow-up in 2 months off of Latuda to attend appointments even today on her own.  She is updated on prevention and monitoring safety hygiene in lactation.  Delight Hoh, MD

## 2020-04-24 ENCOUNTER — Ambulatory Visit: Payer: No Typology Code available for payment source | Attending: Family Medicine | Admitting: Physical Therapy

## 2020-04-24 ENCOUNTER — Telehealth: Payer: Self-pay | Admitting: Physical Therapy

## 2020-04-24 NOTE — Telephone Encounter (Signed)
Pt was a no-show for PT appt on 04/24/20 at 12:30pm.  PT called and LM for Pt to return our call before next scheduled visit.    Lavren Lewan, PT 04/24/20 12:54 PM

## 2020-05-01 ENCOUNTER — Encounter: Payer: No Typology Code available for payment source | Admitting: Physical Therapy

## 2020-05-15 ENCOUNTER — Ambulatory Visit: Payer: No Typology Code available for payment source | Admitting: Physical Therapy

## 2020-05-24 ENCOUNTER — Other Ambulatory Visit: Payer: Self-pay

## 2020-05-24 ENCOUNTER — Ambulatory Visit (INDEPENDENT_AMBULATORY_CARE_PROVIDER_SITE_OTHER): Payer: No Typology Code available for payment source | Admitting: Family Medicine

## 2020-05-24 VITALS — BP 100/62 | HR 88 | Ht 64.0 in | Wt 138.0 lb

## 2020-05-24 DIAGNOSIS — R0781 Pleurodynia: Secondary | ICD-10-CM | POA: Diagnosis not present

## 2020-05-24 DIAGNOSIS — M999 Biomechanical lesion, unspecified: Secondary | ICD-10-CM | POA: Diagnosis not present

## 2020-05-24 NOTE — Progress Notes (Signed)
Julie Webb Phone: [phone] Subjective:   Fontaine No, am serving as a scribe for Dr. Hulan Saas. This visit occurred during the SARS-CoV-2 public health emergency.  Safety protocols were in place, including screening questions prior to the visit, additional usage of staff PPE, and extensive cleaning of exam room while observing appropriate contact time as indicated for disinfecting solutions.   I'm seeing this patient by the request  of:  Debbrah Alar, NP  CC: Back and neck pain follow-up  [id number]  Julie Webb is a 29 y.o. female coming in with complaint of back and neck pain. OMT 02/29/2020. Patient states has been doing relatively well at this time.  Patient does feel that carrying her child can give some difficulty previously but is doing relatively well.  Feels like just some mild aches and pains nothing severe seems to be more of the upper back  Medications patient has been prescribed: Cymbalta 60 mg  Taking: Yes         Reviewed prior external information including notes and imaging from previsou exam, outside providers and external EMR if available.   As well as notes that were available from care everywhere and other healthcare systems.  Past medical history, social, surgical and family history all reviewed in electronic medical record.  No pertanent information unless stated regarding to the chief complaint.   Past Medical History:  Diagnosis Date  . Anxiety   . Depression   . Glomerulonephritis, poststreptococcal    age 59  . 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 Migraines   . Vaginal Pap smear, abnormal     Allergies  Allergen Reactions  . Ibuprofen Other (See Comments)    Kidney failure  . Phenergan [Promethazine Hcl] Other (See Comments)    Restlessness w/ IV Phenergen. Probable mild EPS. Tolerates if taken w/ Benadryl.      Review of Systems:  No  headache, visual changes, nausea, vomiting, diarrhea, constipation, dizziness, abdominal pain, skin rash, fevers, chills, night sweats, weight loss, swollen lymph nodes, body aches, joint swelling, chest pain, shortness of breath, mood changes. POSITIVE muscle aches  Objective  Blood pressure 100/62, pulse 88, height 5\' 4"  (1.626 m), weight 138 lb (62.6 kg), SpO2 97 %, currently breastfeeding.   General: No apparent distress alert and oriented x3 mood and affect normal, dressed appropriately.  HEENT: Pupils equal, extraocular movements intact  Respiratory: Patient's speak in full sentences and does not appear short of breath  Cardiovascular: No lower extremity edema, non tender, no erythema  Neuro: Cranial nerves II through XII are intact, neurovascularly intact in all extremities with 2+ DTRs and 2+ pulses.  Gait normal with good balance and coordination.  MSK:  Non tender with full range of motion and good stability and symmetric strength and tone of shoulders, elbows, wrist, hip, knee and ankles bilaterally.  Back -neck exam does have some mild loss of lordosis.  Tightness noted in the parascapular region right greater than left.  Couple trigger points noted on the side.  No radiation noted to the rib at the moment.  Low back some tightness in the thoracolumbar juncture.  5 out of 5 strength of all the extremities  Osteopathic findings  C2 flexed rotated and side bent right C6 flexed rotated and side bent left T3 extended rotated and side bent right inhaled rib T9 extended rotated and side bent left      Assessment and  Plan:  Rib pain on right side Stable, home exercises, continue same plan rtc in 12 week s    Nonallopathic problems  Decision today to treat with OMT was based on Physical Exam  After verbal consent patient was treated with HVLA, ME, FPR techniques in cervical, rib, thoracic, lumbar, and sacral  areas  Patient tolerated the procedure well with improvement in  symptoms  Patient given exercises, stretches and lifestyle modifications  See medications in patient instructions if given  Patient will follow up in 4-8 weeks      The above documentation has been reviewed and is accurate and complete Lyndal Pulley, DO       Note: This dictation was prepared with Dragon dictation along with smaller phrase technology. Any transcriptional errors that result from this process are unintentional.

## 2020-05-24 NOTE — Patient Instructions (Signed)
Thank you for all you are doing See me in 8 weeks  Or 12 if you are doing better

## 2020-05-25 ENCOUNTER — Encounter: Payer: Self-pay | Admitting: Family Medicine

## 2020-05-25 NOTE — Assessment & Plan Note (Signed)
Stable, home exercises, continue same plan rtc in 12 week s

## 2020-06-20 ENCOUNTER — Encounter: Payer: Self-pay | Admitting: Psychiatry

## 2020-06-20 ENCOUNTER — Ambulatory Visit (INDEPENDENT_AMBULATORY_CARE_PROVIDER_SITE_OTHER): Payer: No Typology Code available for payment source | Admitting: Psychiatry

## 2020-06-20 ENCOUNTER — Other Ambulatory Visit: Payer: Self-pay

## 2020-06-20 ENCOUNTER — Other Ambulatory Visit: Payer: Self-pay | Admitting: Psychiatry

## 2020-06-20 VITALS — Ht 64.0 in | Wt 138.0 lb

## 2020-06-20 DIAGNOSIS — F411 Generalized anxiety disorder: Secondary | ICD-10-CM

## 2020-06-20 DIAGNOSIS — F4010 Social phobia, unspecified: Secondary | ICD-10-CM | POA: Diagnosis not present

## 2020-06-20 DIAGNOSIS — F410 Panic disorder [episodic paroxysmal anxiety] without agoraphobia: Secondary | ICD-10-CM

## 2020-06-20 DIAGNOSIS — F330 Major depressive disorder, recurrent, mild: Secondary | ICD-10-CM

## 2020-06-20 MED ORDER — BUPROPION HCL ER (XL) 300 MG PO TB24: 300.0000 mg | ORAL_TABLET | Freq: Every day | ORAL | 1 refills | Status: DC

## 2020-06-20 MED ORDER — DULOXETINE HCL 60 MG PO CPEP: 60.0000 mg | ORAL_CAPSULE | Freq: Every day | ORAL | 1 refills | Status: DC

## 2020-06-20 MED FILL — DULoxetine HCL 60 MG CPEP: 60 | 90 days supply | Qty: 90 | Fill #0

## 2020-06-20 MED FILL — buPROPion HCL ER (XL) 300 M: 300 | 90 days supply | Qty: 90 | Fill #0

## 2020-06-20 NOTE — Progress Notes (Signed)
[facility]  Patient ID: Julie Webb,  MRN: [id number]  PCP: Debbrah Alar, NP  Date of Evaluation: 06/20/2020 Time spent:25 minutes from 1310 to 1335  Chief Complaint:  Chief Complaint    Anxiety; Depression; Panic Attack      HISTORY/CURRENT STATUS: Julie Webb is seen onsite in office 25 minutes face-to-face individually arriving 7 minutes late with consent with epic collateral for psychiatric interview and exam in 42-month evaluation and management of major depression now 7 months postpartum, social/panic/generalized anxiety, and insomnia she associates with a number of cognitively involutional symptoms lately.  Patient did stop Latuda 10 mg last appointment 2 months ago and has sustained clearing of the early postpartum prepsychotic symptoms. She has been less depressed on the Cymbalta and low-dose Wellbutrin until last several weeks.  She is apathetic with diminished libido not interested in anything except taking care of her son Julie Webb who is crawling and calling out her title.  Husand may have to have a gastric bypass and is not doing well in mental health either. 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  She has not accrued enough sick leave that might be used for her gallbladder surgery if she still needs that.  She doubts the osteopathic manipulations are necessary or helpful for peripregnancy chest and abdominal pains.  However, ongoing headaches just starting before last appointment did not eventuate into a full relapse of migraine.  She  continues breast-feeding and feels the need to leave work to be with baby. She may take Melatonin for initiating sleep when previously she slept too much. She is taking Wellbutrin and Cymbalta at bedtime in order to remember and would be willing to change her medications to the morning.  She does not want to take a sleeping medication other than the melatonin.   She has no mania, suicidality, psychosis or delirium.  Depression The patient presents withdepressionasa recurrentproblemwithcurrent episode startingmore than 55monthsago. The onset quality is sudden. The problem occurs intermittently currently with stuttering improvement backsliding somewhat at 47months post partum.Associated symptoms include decreased concentration,apathetic mental fatigue,labile energy,easy worry, insomnia, less lactation when working, and diminished libido.  Associated symptoms includenoself-harm,nomisperceptions,no psychosis,nodecreased interest,no irritability, nohelpless,nohopeless,no prepanic, no myalgia,andnosuicidality.The symptoms are aggravated byexpectation forwork or loss of such, medication, social issues and family issues.Past treatments includeSSRIs, other medications, and psychotherapy.Compliance with treatment ispoor tovariable.Past compliance problems includesocial anxiety,medical issues, difficulty with treatment plan and medication issues.Previous treatment provided moderaterelief.Risk factors include family history, a change in medication usage/dosage, the patient not taking medications correctly, emotional abuse, prior traumatic experience, prior psychiatric admission, major life event, family history of mental illness, history of mental illness, history of self-injury and history of suicide attempt. Past medical history includes thyroid problem,recent illness,anxiety,depression,mental health disorder,schizophreniaand suicide attempts. Pertinent negatives include no chronic pain,no terminal illness,no bipolar disorder,no eating disorder,no obsessive-compulsive disorder,no post-traumatic stress disorderand no head trauma.  Individual Medical History/ Review of Systems: Changes? :Yes Weight is down 6 pounds in 8 weeks.  She finds no need for musculoskeletal pain management but does have concern for  surgical opinion about gallbladder.   Allergies: Ibuprofen and Phenergan [promethazine hcl]  Current Medications:  Current Outpatient Medications:  .  acetaminophen (TYLENOL) 500 MG tablet, Take 500 mg by mouth every 6 (six) hours as needed for mild pain., Disp: , Rfl:  .  aspirin 81 MG chewable tablet, Chew by mouth daily., Disp: , Rfl:  .  buPROPion (WELLBUTRIN XL) 300  MG 24 hr tablet, Take 1 tablet (300 mg total) by mouth daily after breakfast., Disp: 90 tablet, Rfl: 1 .  butalbital-acetaminophen-caffeine (FIORICET, ESGIC) 50-325-40 MG tablet, Take 1 tablet by mouth every 6 (six) hours as needed for headache., Disp: 12 tablet, Rfl: 5 .  DULoxetine (CYMBALTA) 60 MG capsule, Take 1 capsule (60 mg total) by mouth daily after breakfast., Disp: 90 capsule, Rfl: 1 .  Prenat w/o A-FeCbn-DSS-FA-DHA (PRENAISSANCE PLUS) 28-1-250 MG CAPS, Take 1 capsule by mouth daily., Disp: , Rfl:   Medication Side Effects: none  Family Medical/ Social History: Changes? Yes though work is her greatest stressor that may be so because the home needs her more.  MENTAL HEALTH EXAM:  Height 5\' 4"  (1.626 m), weight 138 lb (62.6 kg), currently breastfeeding.Body mass index is 23.69 kg/m.  General Appearance: Casual, Meticulous and Well Groomed  Eye Contact:  Good  Speech:  Clear and Coherent, Normal Rate and Talkative  Volume:  Normal  Mood:  Anxious and Dysphoric  Affect:  Congruent, Inappropriate, Full Range and Anxious  Thought Process:  Coherent, Goal Directed, Linear and Descriptions of Associations: Tangential  Orientation:  Full (Time, Place, and Person)  Thought Content: Rumination and Tangential   Suicidal Thoughts:  No  Homicidal Thoughts:  No  Memory:  Immediate;   Good Remote;   Good  Judgement:  Good  Insight:  Good  Psychomotor Activity:  Normal, Decreased and Mannerisms  Concentration:  Concentration: Fair and Attention Span: Good  Recall:  Good  Fund of Knowledge: Good  Language: Good   Assets:  Desire for Improvement Intimacy Resilience Talents/Skills  ADL's:  Intact  Cognition: WNL  Prognosis:  Good    DIAGNOSES:    ICD-10-CM   1. Mild recurrent major depression (HCC)  F33.0 DULoxetine (CYMBALTA) 60 MG capsule    buPROPion (WELLBUTRIN XL) 300 MG 24 hr tablet  2. Social anxiety disorder  F40.10 DULoxetine (CYMBALTA) 60 MG capsule  3. Generalized anxiety disorder  F41.1 DULoxetine (CYMBALTA) 60 MG capsule    buPROPion (WELLBUTRIN XL) 300 MG 24 hr tablet  4. Panic disorder  F41.0 DULoxetine (CYMBALTA) 60 MG capsule    Receiving Psychotherapy: No    RECOMMENDATIONS: The time course of insomnia, diminished libido, and apathy is commensurate with the discontinuation of Latuda though she explains that she only takes 2 medications now for her depression when still nursing with less milk production when working.  For the residual depressive and family obligations and remorse symptoms, increasing Wellbutrin may be helpful as restarting Latuda.  She is E scribed Wellbutrin increased to 300 mg XL from 150 mg changed to every morning after breakfast for current schedule of bedtime dosing scribes #90 with 1 refill to [facility] outpatient pharmacy for depression and generalized anxiety.  Cymbalta is E scribed 60 mg every morning after breakfast instead of bedtime sent as #90 with 1 refill to [facility], outpatient pharmacy for depression, social anxiety, and panic disorders.  Closure of my care due to retirement soon concludes with patient today that next appointment be in 3 months with advanced practitioner here in the office discussing all options updating prevention and monitoring safety hygiene.  Delight Hoh, MD

## 2020-06-21 ENCOUNTER — Encounter: Payer: Self-pay | Admitting: Psychiatry

## 2020-07-30 ENCOUNTER — Ambulatory Visit: Payer: Self-pay | Admitting: General Surgery

## 2020-08-08 ENCOUNTER — Encounter: Payer: Self-pay | Admitting: Psychiatry

## 2020-08-14 ENCOUNTER — Encounter: Payer: Self-pay | Admitting: Family

## 2020-08-14 ENCOUNTER — Other Ambulatory Visit: Payer: Self-pay

## 2020-08-14 ENCOUNTER — Telehealth (INDEPENDENT_AMBULATORY_CARE_PROVIDER_SITE_OTHER): Payer: No Typology Code available for payment source | Admitting: Family

## 2020-08-14 ENCOUNTER — Other Ambulatory Visit: Payer: Self-pay | Admitting: Family

## 2020-08-14 DIAGNOSIS — J40 Bronchitis, not specified as acute or chronic: Secondary | ICD-10-CM

## 2020-08-14 MED ORDER — BENZONATATE 100 MG PO CAPS: 100.0000 mg | ORAL_CAPSULE | Freq: Three times a day (TID) | ORAL | 0 refills | Status: DC | PRN

## 2020-08-14 MED ORDER — AZITHROMYCIN 250 MG PO TABS: ORAL_TABLET | ORAL | 0 refills | Status: DC

## 2020-08-14 MED FILL — AZITHROMYCIN 250 MG TABLET: 250 | 5 days supply | Qty: 6 | Fill #0

## 2020-08-14 MED FILL — BENZONATATE 100 MG CAPS: 100 | 7 days supply | Qty: 20 | Fill #0

## 2020-08-14 NOTE — Progress Notes (Signed)
Virtual Visit via Video Note  I connected with [place] on 08/14/20 at  4:40 PM EDT by a video enabled telemedicine application and verified that I am speaking with the correct person using two identifiers.  Location: Patient: home Provider: work   I discussed the limitations of evaluation and management by telemedicine and the availability of in person appointments. The patient expressed understanding and agreed to proceed. Only the patient and myself were present for today's video call.   History of Present Illness:  Patient is a 29 yr old female who presents today with c/o cough, nasal congestion and diarrhea. Symptoms began 15th.  Her initial symptoms was sore throat and cough.  She reports that she developed diarrhea today.  Nasal congestion has been throughout her illness. She has completed covid vaccination.  She has tried dayquil and nyquil without much improvement.  She is not breastfeeding.   Reports feeling overall miserable without improvement in her symptoms.    Husband and son were also sick with similar symptoms and both tested negative for covid. Son had a respiratory panel performed and he tested + for RSV.    Observations/Objective:   Gen: Awake, alert, no acute distress Resp: Breathing is even and non-labored Psych: calm/pleasant demeanor Neuro: Alert and Oriented x 3, + facial symmetry, speech is clear.   Assessment and Plan:  Bronchitis- likely also started with RSV infection but give duration of symptoms and lack of improvement, I am concerned about bacterial infection and possibility of PNA. Will rx with azithromycin and prn tessalon. Pt is advised to call if symptoms worsen, or if not improved in 3 days. Pt verbalizes understanding.  Follow Up Instructions:    I discussed the assessment and treatment plan with the patient. The patient was provided an opportunity to ask questions and all were answered. The patient agreed with the plan and demonstrated an  understanding of the instructions.   The patient was advised to call back or seek an in-person evaluation if the symptoms worsen or if the condition fails to improve as anticipated.  Nance Pear, NP

## 2020-08-16 ENCOUNTER — Ambulatory Visit: Payer: No Typology Code available for payment source | Admitting: Family Medicine

## 2020-09-07 NOTE — Progress Notes (Signed)
DUE TO COVID-19 ONLY ONE VISITOR IS ALLOWED TO COME WITH YOU AND STAY IN THE WAITING ROOM ONLY DURING PRE OP AND PROCEDURE DAY OF SURGERY. THE 1 VISITOR  MAY VISIT WITH YOU AFTER SURGERY IN YOUR PRIVATE ROOM DURING VISITING HOURS ONLY!  YOU NEED TO HAVE A COVID 19 TEST ON___11/29/2021 ____ @_______ , THIS TEST MUST BE DONE BEFORE SURGERY,  COVID TESTING SITE [address] [place] [address], IT IS ON THE RIGHT GOING OUT [address] APPROXIMATELY  2 MINUTES PAST [employer] ON THE RIGHT. ONCE YOUR COVID TEST IS COMPLETED,  PLEASE BEGIN THE QUARANTINE INSTRUCTIONS AS OUTLINED IN YOUR HANDOUT.                Julie Webb  09/07/2020   Your procedure is scheduled on:    09/20/2020    Report to [facility] Main  Entrance   Report to admitting at  0930   AM     Call this number if you have problems the morning of surgery [phone]    REMEMBER: NO  SOLID FOOD CANDY OR GUM AFTER MIDNIGHT. CLEAR LIQUIDS UNTIL   0830 am       . NOTHING BY MOUTH EXCEPT CLEAR LIQUIDS UNTIL    . PLEASE FINISH ENSURE DRINK PER SURGEON ORDER  WHICH NEEDS TO BE COMPLETED AT  0830am     .      CLEAR LIQUID DIET   Foods Allowed                                                                    Coffee and tea, regular and decaf                            Fruit ices (not with fruit pulp)                                      Iced Popsicles                                    Carbonated beverages, regular and diet                                    Cranberry, grape and apple juices Sports drinks like Gatorade Lightly seasoned clear broth or consume(fat free) Sugar, honey syrup ___________________________________________________________________      BRUSH YOUR TEETH MORNING OF SURGERY AND RINSE YOUR MOUTH OUT, NO CHEWING GUM CANDY OR MINTS.     Take these medicines the morning of surgery with A SIP OF WATER:   Wellbutrin, Cymbalta                                 You may not  have any metal on your body including hair pins and              piercings  Do not wear jewelry, make-up,  lotions, powders or perfumes, deodorant             Do not wear nail polish on your fingernails.  Do not shave  48 hours prior to surgery.              Men may shave face and neck.   Do not bring valuables to the hospital. [place].  Contacts, dentures or bridgework may not be worn into surgery.  Leave suitcase in the car. After surgery it may be brought to your room.     Patients discharged the day of surgery will not be allowed to drive home. IF YOU ARE HAVING SURGERY AND GOING HOME THE SAME DAY, YOU MUST HAVE AN ADULT TO DRIVE YOU HOME AND BE WITH YOU FOR 24 HOURS. YOU MAY GO HOME BY TAXI OR UBER OR ORTHERWISE, BUT AN ADULT MUST ACCOMPANY YOU HOME AND STAY WITH YOU FOR 24 HOURS.  Name and phone number of your driver:  Special Instructions: N/A              Please read over the following fact sheets you were given: _____________________________________________________________________  [facility] - Preparing for Surgery Before surgery, you can play an important role.  Because skin is not sterile, your skin needs to be as free of germs as possible.  You can reduce the number of germs on your skin by washing with CHG (chlorahexidine gluconate) soap before surgery.  CHG is an antiseptic cleaner which kills germs and bonds with the skin to continue killing germs even after washing. Please DO NOT use if you have an allergy to CHG or antibacterial soaps.  If your skin becomes reddened/irritated stop using the CHG and inform your nurse when you arrive at Short Stay. Do not shave (including legs and underarms) for at least 48 hours prior to the first CHG shower.  You may shave your face/neck. Please follow these instructions carefully:  1.  Shower with CHG Soap the night before surgery and the  morning of Surgery.  2.  If you choose to wash your  hair, wash your hair first as usual with your  normal  shampoo.  3.  After you shampoo, rinse your hair and body thoroughly to remove the  shampoo.                           4.  Use CHG as you would any other liquid soap.  You can apply chg directly  to the skin and wash                       Gently with a scrungie or clean washcloth.  5.  Apply the CHG Soap to your body ONLY FROM THE NECK DOWN.   Do not use on face/ open                           Wound or open sores. Avoid contact with eyes, ears mouth and genitals (private parts).                       Wash face,  Genitals (private parts) with your normal soap.             6.  Wash thoroughly, paying special attention to  the area where your surgery  will be performed.  7.  Thoroughly rinse your body with warm water from the neck down.  8.  DO NOT shower/wash with your normal soap after using and rinsing off  the CHG Soap.                9.  Pat yourself dry with a clean towel.            10.  Wear clean pajamas.            11.  Place clean sheets on your bed the night of your first shower and do not  sleep with pets. Day of Surgery : Do not apply any lotions/deodorants the morning of surgery.  Please wear clean clothes to the hospital/surgery center.  FAILURE TO FOLLOW THESE INSTRUCTIONS MAY RESULT IN THE CANCELLATION OF YOUR SURGERY PATIENT SIGNATURE_________________________________  NURSE SIGNATURE__________________________________  ________________________________________________________________________

## 2020-09-11 ENCOUNTER — Encounter (HOSPITAL_COMMUNITY): Payer: Self-pay

## 2020-09-11 ENCOUNTER — Other Ambulatory Visit: Payer: Self-pay

## 2020-09-11 ENCOUNTER — Encounter (HOSPITAL_COMMUNITY)
Admission: RE | Admit: 2020-09-11 | Discharge: 2020-09-11 | Disposition: A | Discharge status: Home / Self Care | Payer: No Typology Code available for payment source | Source: Ambulatory Visit | Attending: General Surgery | Admitting: General Surgery

## 2020-09-11 DIAGNOSIS — Z01812 Encounter for preprocedural laboratory examination: Secondary | ICD-10-CM | POA: Diagnosis not present

## 2020-09-11 HISTORY — DX: Family history of other specified conditions: Z84.89

## 2020-09-11 LAB — COMPREHENSIVE METABOLIC PANEL
ALT: 11 U/L (ref 0–44)
AST: 17 U/L (ref 15–41)
Albumin: 4.5 g/dL (ref 3.5–5.0)
Alkaline Phosphatase: 62 U/L (ref 38–126)
Anion gap: 6 (ref 5–15)
BUN: 8 mg/dL (ref 6–20)
CO2: 28 mmol/L (ref 22–32)
Calcium: 9.2 mg/dL (ref 8.9–10.3)
Chloride: 105 mmol/L (ref 98–111)
Creatinine, Ser: 0.72 mg/dL (ref 0.44–1.00)
GFR, Estimated: >60 mL/min (ref >60)
Glucose, Bld: 96 mg/dL (ref 70–99)
Potassium: 3.9 mmol/L (ref 3.5–5.1)
Sodium: 139 mmol/L (ref 135–145)
Total Bilirubin: 0.4 mg/dL (ref 0.3–1.2)
Total Protein: 7.5 g/dL (ref 6.5–8.1)

## 2020-09-11 LAB — CBC WITH DIFFERENTIAL/PLATELET
Abs Immature Granulocytes: 0.01 10*3/uL (ref 0.00–0.07)
Basophils Absolute: 0.1 10*3/uL (ref 0.0–0.1)
Basophils Relative: 1 %
Eosinophils Absolute: 0.2 10*3/uL (ref 0.0–0.5)
Eosinophils Relative: 3 %
HCT: 44.9 % (ref 36.0–46.0)
Hemoglobin: 14.6 g/dL (ref 12.0–15.0)
Immature Granulocytes: 0 %
Lymphocytes Relative: 50 %
Lymphs Abs: 2.5 10*3/uL (ref 0.7–4.0)
MCH: 29.4 pg (ref 26.0–34.0)
MCHC: 32.5 g/dL (ref 30.0–36.0)
MCV: 90.5 fL (ref 80.0–100.0)
Monocytes Absolute: 0.5 10*3/uL (ref 0.1–1.0)
Monocytes Relative: 10 %
Neutro Abs: 1.8 10*3/uL (ref 1.7–7.7)
Neutrophils Relative %: 36 %
Platelets: 247 10*3/uL (ref 150–400)
RBC: 4.96 MIL/uL (ref 3.87–5.11)
RDW: 12.9 % (ref 11.5–15.5)
WBC: 5 10*3/uL (ref 4.0–10.5)
nRBC: 0 % (ref 0.0–0.2)

## 2020-09-17 ENCOUNTER — Other Ambulatory Visit (HOSPITAL_COMMUNITY)
Admission: RE | Admit: 2020-09-17 | Discharge: 2020-09-17 | Disposition: A | Discharge status: Home / Self Care | Payer: No Typology Code available for payment source | Source: Ambulatory Visit | Attending: General Surgery | Admitting: General Surgery

## 2020-09-17 DIAGNOSIS — Z20822 Contact with and (suspected) exposure to covid-19: Secondary | ICD-10-CM | POA: Diagnosis not present

## 2020-09-17 DIAGNOSIS — Z01812 Encounter for preprocedural laboratory examination: Secondary | ICD-10-CM | POA: Diagnosis present

## 2020-09-17 LAB — SARS CORONAVIRUS 2 (TAT 6-24 HRS): SARS Coronavirus 2: NEGATIVE

## 2020-09-19 ENCOUNTER — Encounter (HOSPITAL_COMMUNITY): Payer: Self-pay | Admitting: General Surgery

## 2020-09-19 ENCOUNTER — Ambulatory Visit: Payer: No Typology Code available for payment source | Admitting: Psychiatry

## 2020-09-19 HISTORY — PX: CHOLECYSTECTOMY: SHX55

## 2020-09-19 NOTE — H&P (Signed)
Julie Webb Appointment: 03/14/2020 9:45 AM Location: [facility] Patient #: [id number] DOB: 17-Jan-1991 Married / Language: English / Race: [place] Female   History of Present Illness Randall Hiss M. Aleathea Pugmire MD; 03/14/2020 10:22 AM) The patient is a 29 year old female who presents for evaluation of gall stones. she is referred by Debbrah Alar for evaluation of gallstones. She states that she started having issues during her most recent pregnancy. She stated that she had constant right-sided pain. It was felt to be in her ribs as well as underneath her ribs. She had also have episodes of nausea. It was initially felt that she may have rib issues due to her pregnancy. She states that she is referred for physical therapy. While they were doing abdominal physical therapy and palpating in her right upper quadrant they found an area that she was very symptomatic in which was around the gallbladder area. This prompted an abdominal ultrasound which I reviewed. It showed gallstones and gallbladder sludge. This was done on April 1. She had a stone up to 1.1 cm in size. She had a negative right rib series on March 31 which I reviewed. She states that her discomfort has improved since her delivery. Eating did not really exacerbate her pain. She was still have episodes of about once a month for she will get pain on her right side nausea and some vomiting. It will occasionally radiate to her back. She felt bloated during her pregnancy. It felt like a band on her right side. She denies any prior abdominal surgery. Her child is 44 months old. She is breast-feeding. She no longer has any additional pal time right now   Problem List/Past Medical Randall Hiss M. Redmond Pulling, MD; 03/14/2020 10:25 AM) SYMPTOMATIC CHOLELITHIASIS (K80.20)   Past Surgical History (Tanisha A. Owens Shark, [place]; 03/14/2020 9:51 AM) No pertinent past surgical history   Diagnostic Studies History (Tanisha A. Owens Shark, [place]; 03/14/2020 9:51  AM) Colonoscopy  never Mammogram  never Pap Smear  1-5 years ago  Allergies (Tanisha A. Owens Shark, [place]; 03/14/2020 9:53 AM) Ibuprofen *ANALGESICS - ANTI-INFLAMMATORY*  NSAIDs  Phenergan *ANTIHISTAMINES*  cant have IV admin Allergies Reconciled   Medication History (Tanisha A. Owens Shark, [place]; 03/14/2020 9:53 AM) buPROPion HCl ER (XL) (150MG  Tablet ER 24HR, Oral) Active. DULoxetine HCl (60MG  Capsule DR Part, Oral) Active. Latuda (20MG  Tablet, Oral) Active. Prenat w/o A Vit-FeCb-FeFum-FA (30-1MG  Tablet, Oral) Active. Medications Reconciled  Social History (Tanisha A. Owens Shark, [place]; 03/14/2020 9:51 AM) Alcohol use  Occasional alcohol use. Caffeine use  Carbonated beverages, Coffee. No drug use  Tobacco use  Never smoker.  Family History (Tanisha A. Owens Shark, [place]; 03/14/2020 9:51 AM) Alcohol Abuse  Family Members In General, Father, Mother. Arthritis  Mother. Cancer  Family Members In General. Heart disease in female family member before age 61  Heart disease in female family member before age 84  Malignant Neoplasm Of Pancreas  Family Members In General. Respiratory Condition  Father.  Pregnancy / Birth History (Tanisha A. Owens Shark, [place]; 03/14/2020 9:51 AM) Age at menarche  67 years. Contraceptive History  Intrauterine device. Gravida  3 Length (months) of breastfeeding  3-6 Maternal age  32-25 Para  1 Regular periods   Other Problems Randall Hiss M. Redmond Pulling, MD; 03/14/2020 10:25 AM) Anxiety Disorder  Cholelithiasis  Chronic Renal Failure Syndrome  Migraine Headache  Other disease, cancer, significant illness  Depression     Review of Systems (Tanisha A. Brown RMA; 03/14/2020 9:51 AM) General Not Present- Appetite Loss, Chills, Fatigue, Fever, Night Sweats, Weight  Gain and Weight Loss. Skin Not Present- Change in Wart/Mole, Dryness, Hives, Jaundice, New Lesions, Non-Healing Wounds, Rash and Ulcer. HEENT Present- Seasonal Allergies. Not Present- Earache, Hearing  Loss, Hoarseness, Nose Bleed, Oral Ulcers, Ringing in the Ears, Sinus Pain, Sore Throat, Visual Disturbances, Wears glasses/contact lenses and Yellow Eyes. Respiratory Not Present- Bloody sputum, Chronic Cough, Difficulty Breathing, Snoring and Wheezing. Breast Not Present- Breast Mass, Breast Pain, Nipple Discharge and Skin Changes. Cardiovascular Not Present- Chest Pain, Difficulty Breathing Lying Down, Leg Cramps, Palpitations, Rapid Heart Rate, Shortness of Breath and Swelling of Extremities. Gastrointestinal Present- Abdominal Pain, Bloating and Constipation. Not Present- Bloody Stool, Change in Bowel Habits, Chronic diarrhea, Difficulty Swallowing, Excessive gas, Gets full quickly at meals, Hemorrhoids, Indigestion, Nausea, Rectal Pain and Vomiting. Female Genitourinary Present- Frequency, Pelvic Pain and Urgency. Not Present- Nocturia and Painful Urination. Musculoskeletal Present- Muscle Pain and Muscle Weakness. Not Present- Back Pain, Joint Pain, Joint Stiffness and Swelling of Extremities. Neurological Present- Tremor. Not Present- Decreased Memory, Fainting, Headaches, Numbness, Seizures, Tingling, Trouble walking and Weakness. Psychiatric Present- Anxiety and Depression. Not Present- Bipolar, Change in Sleep Pattern, Fearful and Frequent crying. Endocrine Present- Hair Changes. Not Present- Cold Intolerance, Excessive Hunger, Heat Intolerance, Hot flashes and New Diabetes. Hematology Not Present- Blood Thinners, Easy Bruising, Excessive bleeding, Gland problems, HIV and Persistent Infections.  Vitals (Tanisha A. Brown RMA; 03/14/2020 9:54 AM) 03/14/2020 9:53 AM Weight: 144.4 lb Height: 64in Body Surface Area: 1.7 m Body Mass Index: 24.79 kg/m  Temp.: 98.27F  Pulse: 112 (Regular)  BP: 112/74(Sitting, Left Arm, Standard)       Physical Exam Randall Hiss M. Karstyn Birkey MD; 03/14/2020 10:19 AM) General Mental Status-Alert. General Appearance-Consistent with stated  age. Hydration-Well hydrated. Voice-Normal.  Head and Neck Head-normocephalic, atraumatic with no lesions or palpable masses. Trachea-midline. Thyroid Gland Characteristics - normal size and consistency.  Eye Eyeball - Bilateral-Extraocular movements intact. Sclera/Conjunctiva - Bilateral-No scleral icterus.  Chest and Lung Exam Chest and lung exam reveals -quiet, even and easy respiratory effort with no use of accessory muscles and on auscultation, normal breath sounds, no adventitious sounds and normal vocal resonance. Inspection Chest Wall - Normal. Back - normal.  Breast - Did not examine.  Cardiovascular Cardiovascular examination reveals -normal heart sounds, regular rate and rhythm with no murmurs and normal pedal pulses bilaterally.  Abdomen Inspection Inspection of the abdomen reveals - No Hernias. Skin - Scar - no surgical scars. Palpation/Percussion Palpation and Percussion of the abdomen reveal - Soft, Non Tender, No Rebound tenderness, No Rigidity (guarding) and No hepatosplenomegaly. Auscultation Auscultation of the abdomen reveals - Bowel sounds normal.  Peripheral Vascular Upper Extremity Palpation - Pulses bilaterally normal.  Neurologic Neurologic evaluation reveals -alert and oriented x 3 with no impairment of recent or remote memory. Mental Status-Normal.  Neuropsychiatric The patient's mood and affect are described as -normal. Judgment and Insight-insight is appropriate concerning matters relevant to self.  Musculoskeletal Normal Exam - Left-Upper Extremity Strength Normal and Lower Extremity Strength Normal. Normal Exam - Right-Upper Extremity Strength Normal and Lower Extremity Strength Normal.  Lymphatic Head & Neck  General Head & Neck Lymphatics: Bilateral - Description - Normal. Axillary - Did not examine. Femoral & Inguinal - Did not examine.    Assessment & Plan Randall Hiss M. Toddrick Sanna MD; 03/14/2020 10:25  AM) SYMPTOMATIC CHOLELITHIASIS (K80.20) Impression: I think a component of her story is may be consistent with was his skeletal issues on the right side-rib pain at the think a component of her most recent complaints is  due to cholelithiasis.  We discussed gallbladder disease. The patient was given Neurosurgeon. We discussed non-operative and operative management. We discussed the signs & symptoms of acute cholecystitis  I discussed laparoscopic/MIS cholecystectomy with possible IOC in detail. The patient was given educational material as well as diagrams detailing the procedure. We discussed the risks and benefits of a laparoscopic cholecystectomy including, but not limited to bleeding, infection, injury to surrounding structures such as the intestine or liver, bile leak, retained gallstones, need to convert to an open procedure, prolonged diarrhea, blood clots such as DVT, common bile duct injury, anesthesia risks, and possible need for additional procedures. We discussed the typical post-operative recovery course. I explained that the likelihood of improvement of their symptoms is good.  The patient has elected to proceed with surgery later this fall when her pal time resets. She states that she will talk to her manager see if there can be any provision about taking time off currently. I told her that should she have more frequent issues that we would need to move up surgery. She voiced understanding.  Moderately complex patient requiring a moderate level of analysis of data reviewed. I reviewed the office notes from her sports medicine physician from March and May 2021 her psychiatrist's office note from April, abdominal ultrasound, x-rays and labs. Current Plans Pt Education - Pamphlet Given - Laparoscopic Gallbladder Surgery: discussed with patient and provided information. You are being scheduled for surgery- Our schedulers will call you.  You should hear from our office's scheduling  department sometime in september about the location, date, and time of surgery. We try to make accommodations for patient's preferences in scheduling surgery, but sometimes the OR schedule or the surgeon's schedule prevents [place] from making those accommodations.  If you have not heard from our office [phone]) in mid september, call the office and ask for your surgeon's nurse.  If you have other questions about your diagnosis, plan, or surgery, call the office and ask for your surgeon's nurse.  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 Redmond Pulling, MD, FACS General, Bariatric, & Minimally Invasive Surgery [facility], [place]

## 2020-09-20 ENCOUNTER — Ambulatory Visit (HOSPITAL_COMMUNITY): Payer: No Typology Code available for payment source | Admitting: Anesthesiology

## 2020-09-20 ENCOUNTER — Other Ambulatory Visit (HOSPITAL_COMMUNITY): Payer: Self-pay | Admitting: General Surgery

## 2020-09-20 ENCOUNTER — Encounter (HOSPITAL_COMMUNITY): Payer: Self-pay | Admitting: General Surgery

## 2020-09-20 ENCOUNTER — Ambulatory Visit (HOSPITAL_COMMUNITY)
Admission: RE | Admit: 2020-09-20 | Discharge: 2020-09-20 | Disposition: A | Discharge status: Home / Self Care | Payer: No Typology Code available for payment source | Attending: General Surgery | Admitting: General Surgery

## 2020-09-20 ENCOUNTER — Other Ambulatory Visit: Payer: Self-pay

## 2020-09-20 ENCOUNTER — Encounter (HOSPITAL_COMMUNITY)
Admission: RE | Disposition: A | Discharge status: Home / Self Care | Payer: Self-pay | Source: Home / Self Care | Attending: General Surgery

## 2020-09-20 DIAGNOSIS — K8010 Calculus of gallbladder with chronic cholecystitis without obstruction: Secondary | ICD-10-CM | POA: Insufficient documentation

## 2020-09-20 DIAGNOSIS — K8020 Calculus of gallbladder without cholecystitis without obstruction: Secondary | ICD-10-CM | POA: Diagnosis present

## 2020-09-20 LAB — PREGNANCY, URINE: Preg Test, Ur: NEGATIVE

## 2020-09-20 SURGERY — CHOLECYSTECTOMY, ROBOT-ASSISTED, LAPAROSCOPIC
Anesthesia: General | Site: Abdomen

## 2020-09-20 MED ORDER — ONDANSETRON HCL 4 MG/2ML IJ SOLN
INTRAMUSCULAR | Status: DC | PRN
  Administered 2020-09-20: 4 mg via INTRAVENOUS

## 2020-09-20 MED ORDER — ROCURONIUM BROMIDE 10 MG/ML (PF) SYRINGE
PREFILLED_SYRINGE | INTRAVENOUS | Status: DC | PRN
  Administered 2020-09-20: 10 mg via INTRAVENOUS
  Administered 2020-09-20: 50 mg via INTRAVENOUS

## 2020-09-20 MED ORDER — MEPERIDINE HCL 50 MG/ML IJ SOLN: 6.2500 mg | INTRAMUSCULAR | Status: DC | PRN

## 2020-09-20 MED ORDER — DEXAMETHASONE SODIUM PHOSPHATE 10 MG/ML IJ SOLN
INTRAMUSCULAR | Status: DC | PRN
  Administered 2020-09-20: 8 mg via INTRAVENOUS

## 2020-09-20 MED ORDER — LACTATED RINGERS IV SOLN: INTRAVENOUS | Status: DC

## 2020-09-20 MED ORDER — ROCURONIUM BROMIDE 10 MG/ML (PF) SYRINGE
PREFILLED_SYRINGE | INTRAVENOUS | Status: AC
  Filled 2020-09-20: qty 10

## 2020-09-20 MED ORDER — OXYCODONE HCL 5 MG PO TABS
ORAL_TABLET | ORAL | Status: AC
  Administered 2020-09-20: 5 mg via ORAL
  Filled 2020-09-20: qty 1

## 2020-09-20 MED ORDER — ONDANSETRON HCL 4 MG/2ML IJ SOLN: 4.0000 mg | Freq: Once | INTRAMUSCULAR | Status: DC | PRN

## 2020-09-20 MED ORDER — EPHEDRINE 5 MG/ML INJ
INTRAVENOUS | Status: AC
  Filled 2020-09-20: qty 10

## 2020-09-20 MED ORDER — MIDAZOLAM HCL 2 MG/2ML IJ SOLN
INTRAMUSCULAR | Status: AC
  Filled 2020-09-20: qty 2

## 2020-09-20 MED ORDER — OXYCODONE HCL 5 MG PO TABS: 5.0000 mg | ORAL_TABLET | Freq: Once | ORAL | Status: AC | PRN

## 2020-09-20 MED ORDER — PROPOFOL 10 MG/ML IV BOLUS
INTRAVENOUS | Status: DC | PRN
  Administered 2020-09-20: 150 mg via INTRAVENOUS

## 2020-09-20 MED ORDER — ACETAMINOPHEN 500 MG PO TABS: 1000.0000 mg | ORAL_TABLET | Freq: Three times a day (TID) | ORAL | 0 refills | Status: AC

## 2020-09-20 MED ORDER — INDOCYANINE GREEN 25 MG IV SOLR
2.5000 mg | Freq: Once | INTRAVENOUS | Status: AC
  Administered 2020-09-20: 2.5 mg via INTRAVENOUS
  Filled 2020-09-20: qty 1

## 2020-09-20 MED ORDER — FENTANYL CITRATE (PF) 100 MCG/2ML IJ SOLN
INTRAMUSCULAR | Status: DC | PRN
  Administered 2020-09-20: 25 ug via INTRAVENOUS
  Administered 2020-09-20: 50 ug via INTRAVENOUS
  Administered 2020-09-20: 100 ug via INTRAVENOUS
  Administered 2020-09-20: 75 ug via INTRAVENOUS

## 2020-09-20 MED ORDER — ENSURE PRE-SURGERY PO LIQD
296.0000 mL | Freq: Once | ORAL | Status: DC
  Filled 2020-09-20: qty 296

## 2020-09-20 MED ORDER — CHLORHEXIDINE GLUCONATE 0.12 % MT SOLN
15.0000 mL | Freq: Once | OROMUCOSAL | Status: AC
  Administered 2020-09-20: 15 mL via OROMUCOSAL

## 2020-09-20 MED ORDER — HYDROMORPHONE HCL 1 MG/ML IJ SOLN
0.2500 mg | INTRAMUSCULAR | Status: DC | PRN
  Administered 2020-09-20: 0.5 mg via INTRAVENOUS

## 2020-09-20 MED ORDER — OXYCODONE HCL 5 MG/5ML PO SOLN: 5.0000 mg | Freq: Once | ORAL | Status: AC | PRN

## 2020-09-20 MED ORDER — BUPIVACAINE-EPINEPHRINE (PF) 0.25% -1:200000 IJ SOLN
INTRAMUSCULAR | Status: AC
  Filled 2020-09-20: qty 30

## 2020-09-20 MED ORDER — CHLORHEXIDINE GLUCONATE CLOTH 2 % EX PADS: 6.0000 | MEDICATED_PAD | Freq: Once | CUTANEOUS | Status: DC

## 2020-09-20 MED ORDER — ACETAMINOPHEN 500 MG PO TABS
1000.0000 mg | ORAL_TABLET | ORAL | Status: AC
  Administered 2020-09-20: 1000 mg via ORAL
  Filled 2020-09-20: qty 2

## 2020-09-20 MED ORDER — 0.9 % SODIUM CHLORIDE (POUR BTL) OPTIME
TOPICAL | Status: DC | PRN
  Administered 2020-09-20: 1000 mL

## 2020-09-20 MED ORDER — SCOPOLAMINE 1 MG/3DAYS TD PT72: 1.0000 | MEDICATED_PATCH | TRANSDERMAL | Status: DC

## 2020-09-20 MED ORDER — FENTANYL CITRATE (PF) 250 MCG/5ML IJ SOLN
INTRAMUSCULAR | Status: AC
  Filled 2020-09-20: qty 5

## 2020-09-20 MED ORDER — PROPOFOL 10 MG/ML IV BOLUS
INTRAVENOUS | Status: AC
  Filled 2020-09-20: qty 20

## 2020-09-20 MED ORDER — BUPIVACAINE-EPINEPHRINE 0.25% -1:200000 IJ SOLN
INTRAMUSCULAR | Status: DC | PRN
  Administered 2020-09-20: 30 mL

## 2020-09-20 MED ORDER — HYDROMORPHONE HCL 1 MG/ML IJ SOLN
INTRAMUSCULAR | Status: AC
  Administered 2020-09-20: 0.5 mg via INTRAVENOUS
  Filled 2020-09-20: qty 1

## 2020-09-20 MED ORDER — SODIUM CHLORIDE 0.9 % IV SOLN
2.0000 g | INTRAVENOUS | Status: AC
  Administered 2020-09-20: 2 g via INTRAVENOUS
  Filled 2020-09-20: qty 2

## 2020-09-20 MED ORDER — ORAL CARE MOUTH RINSE: 15.0000 mL | Freq: Once | OROMUCOSAL | Status: AC

## 2020-09-20 MED ORDER — OXYCODONE HCL 5 MG PO TABS: 5.0000 mg | ORAL_TABLET | Freq: Four times a day (QID) | ORAL | 0 refills | Status: DC | PRN

## 2020-09-20 MED ORDER — EPHEDRINE SULFATE-NACL 50-0.9 MG/10ML-% IV SOSY
PREFILLED_SYRINGE | INTRAVENOUS | Status: DC | PRN
  Administered 2020-09-20: 5 mg via INTRAVENOUS
  Administered 2020-09-20: 10 mg via INTRAVENOUS
  Administered 2020-09-20: 5 mg via INTRAVENOUS
  Administered 2020-09-20: 10 mg via INTRAVENOUS
  Administered 2020-09-20: 5 mg via INTRAVENOUS

## 2020-09-20 MED ORDER — LACTATED RINGERS IR SOLN
Status: DC | PRN
  Administered 2020-09-20: 1000 mL

## 2020-09-20 MED ORDER — MIDAZOLAM HCL 5 MG/5ML IJ SOLN
INTRAMUSCULAR | Status: DC | PRN
  Administered 2020-09-20: 2 mg via INTRAVENOUS

## 2020-09-20 MED ORDER — LIDOCAINE 2% (20 MG/ML) 5 ML SYRINGE
INTRAMUSCULAR | Status: DC | PRN
  Administered 2020-09-20: 50 mg via INTRAVENOUS

## 2020-09-20 MED ORDER — SUGAMMADEX SODIUM 200 MG/2ML IV SOLN
INTRAVENOUS | Status: DC | PRN
  Administered 2020-09-20: 190 mg via INTRAVENOUS

## 2020-09-20 MED FILL — oxyCODONE HCL 5 MG TABS: 5 | 4 days supply | Qty: 15 | Fill #0

## 2020-09-20 SURGICAL SUPPLY — 59 items
APPLIER CLIP 5 13 M/L LIGAMAX5 (MISCELLANEOUS)
BLADE SURG SZ11 CARB STEEL (BLADE) ×2 IMPLANT
CANNULA REDUC XI 12-8 STAPL (CANNULA) ×1
CANNULA REDUCER 12-8 DVNC XI (CANNULA) ×1 IMPLANT
CHLORAPREP W/TINT 26 (MISCELLANEOUS) ×2 IMPLANT
CLIP APPLIE 5 13 M/L LIGAMAX5 (MISCELLANEOUS) IMPLANT
CLIP VESOLOCK LG 6/CT PURPLE (CLIP) ×2 IMPLANT
COVER TIP SHEARS 8 DVNC (MISCELLANEOUS) ×1 IMPLANT
COVER TIP SHEARS 8MM DA VINCI (MISCELLANEOUS) ×1
COVER WAND RF STERILE (DRAPES) ×2 IMPLANT
DECANTER SPIKE VIAL GLASS SM (MISCELLANEOUS) ×2 IMPLANT
DRAPE ARM DVNC X/XI (DISPOSABLE) ×4 IMPLANT
DRAPE COLUMN DVNC XI (DISPOSABLE) ×1 IMPLANT
DRAPE DA VINCI XI ARM (DISPOSABLE) ×4
DRAPE DA VINCI XI COLUMN (DISPOSABLE) ×1
DRSG TEGADERM 2-3/8X2-3/4 SM (GAUZE/BANDAGES/DRESSINGS) ×6 IMPLANT
DRSG TEGADERM 4X4.75 (GAUZE/BANDAGES/DRESSINGS) ×2 IMPLANT
ELECT REM PT RETURN 15FT ADLT (MISCELLANEOUS) ×2 IMPLANT
GAUZE SPONGE 2X2 8PLY STRL LF (GAUZE/BANDAGES/DRESSINGS) ×1 IMPLANT
GLOVE BIO SURGEON STRL SZ7.5 (GLOVE) ×4 IMPLANT
GLOVE INDICATOR 8.0 STRL GRN (GLOVE) ×4 IMPLANT
GOWN STRL REUS W/TWL XL LVL3 (GOWN DISPOSABLE) ×4 IMPLANT
GRASPER SUT TROCAR 14GX15 (MISCELLANEOUS) IMPLANT
IRRIGATOR SUCT 8 DISP DVNC XI (IRRIGATION / IRRIGATOR) IMPLANT
IRRIGATOR SUCTION 8MM XI DISP (IRRIGATION / IRRIGATOR)
KIT BASIN OR (CUSTOM PROCEDURE TRAY) ×2 IMPLANT
KIT PROCEDURE DA VINCI SI (MISCELLANEOUS)
KIT PROCEDURE DVNC SI (MISCELLANEOUS) IMPLANT
KIT TURNOVER KIT A (KITS) IMPLANT
MANIFOLD NEPTUNE II (INSTRUMENTS) ×2 IMPLANT
NDL INSUFFLATION 14GA 120MM (NEEDLE) IMPLANT
NEEDLE HYPO 22GX1.5 SAFETY (NEEDLE) ×2 IMPLANT
NEEDLE INSUFFLATION 14GA 120MM (NEEDLE) IMPLANT
PACK CARDIOVASCULAR III (CUSTOM PROCEDURE TRAY) ×2 IMPLANT
POUCH RETRIEVAL ECOSAC 10 (ENDOMECHANICALS) ×1 IMPLANT
POUCH RETRIEVAL ECOSAC 10MM (ENDOMECHANICALS) ×1
SEAL CANN UNIV 5-8 DVNC XI (MISCELLANEOUS) ×4 IMPLANT
SEAL XI 5MM-8MM UNIVERSAL (MISCELLANEOUS) ×4
SET IRRIG TUBING LAPAROSCOPIC (IRRIGATION / IRRIGATOR) IMPLANT
SOL ANTI FOG 6CC (MISCELLANEOUS) ×1 IMPLANT
SOLUTION ANTI FOG 6CC (MISCELLANEOUS) ×1
SOLUTION ELECTROLUBE (MISCELLANEOUS) ×2 IMPLANT
SPONGE GAUZE 2X2 STER 10/PKG (GAUZE/BANDAGES/DRESSINGS) ×1
SPONGE LAP 18X18 RF (DISPOSABLE) ×2 IMPLANT
STAPLER CANNULA SEAL DVNC XI (STAPLE) IMPLANT
STAPLER CANNULA SEAL XI (STAPLE)
STRIP CLOSURE SKIN 1/2X4 (GAUZE/BANDAGES/DRESSINGS) ×2 IMPLANT
SUT MNCRL AB 4-0 PS2 18 (SUTURE) ×2 IMPLANT
SUT VICRYL 0 TIES 12 18 (SUTURE) IMPLANT
SUT VICRYL 0 UR6 27IN ABS (SUTURE) IMPLANT
SYR 10ML ECCENTRIC (SYRINGE) ×2 IMPLANT
SYR 20ML LL LF (SYRINGE) ×2 IMPLANT
SYS RETRIEVAL 5MM INZII UNIV (BASKET)
SYSTEM RETRIEVL 5MM INZII UNIV (BASKET) IMPLANT
TOWEL OR 17X26 10 PK STRL BLUE (TOWEL DISPOSABLE) ×2 IMPLANT
TOWEL OR NON WOVEN STRL DISP B (DISPOSABLE) ×2 IMPLANT
TRAY FOLEY MTR SLVR 16FR STAT (SET/KITS/TRAYS/PACK) IMPLANT
TROCAR BLADELESS OPT 5 100 (ENDOMECHANICALS) IMPLANT
TUBING INSUFFLATION 10FT LAP (TUBING) ×2 IMPLANT

## 2020-09-20 NOTE — Discharge Instructions (Signed)
[facility] LAPAROSCOPIC/Robotic SURGERY: POST OP INSTRUCTIONS Always review your discharge instruction sheet given to you by the facility where your surgery was performed. IF YOU HAVE DISABILITY OR FAMILY LEAVE FORMS, YOU MUST BRING THEM TO THE OFFICE FOR PROCESSING.   DO NOT GIVE THEM TO YOUR DOCTOR.  PAIN CONTROL  1. First take acetaminophen (Tylenol) AND/or ibuprofen (Advil) to control your pain after surgery.  Follow directions on package.  Taking acetaminophen (Tylenol) and/or ibuprofen (Advil) regularly after surgery will help to control your pain and lower the amount of prescription pain medication you may need.  You should not take more than 3,000 mg (3 grams) of acetaminophen (Tylenol) in 24 hours.  You should not take ibuprofen (Advil), aleve, motrin, naprosyn or other NSAIDS if you have a history of stomach ulcers or chronic kidney disease.  2. A prescription for pain medication may be given to you upon discharge.  Take your pain medication as prescribed, if you still have uncontrolled pain after taking acetaminophen (Tylenol) or ibuprofen (Advil). 3. Use ice packs to help control pain. 4. If you need a refill on your pain medication, please contact your pharmacy.  They will contact our office to request authorization. Prescriptions will not be filled after 5pm or on week-ends.  HOME MEDICATIONS 5. Take your usually prescribed medications unless otherwise directed.  DIET 6. You should follow a light diet the first few days after arrival home.  Be sure to include lots of fluids daily. Avoid fatty, fried foods.   CONSTIPATION 7. It is common to experience some constipation after surgery and if you are taking pain medication.  Increasing fluid intake and taking a stool softener (such as Colace) will usually help or prevent this problem from occurring.  A mild laxative (Milk of Magnesia or Miralax) should be taken according to package instructions if there are no  bowel movements after 48 hours.  WOUND/INCISION CARE 8. Most patients will experience some swelling and bruising in the area of the incisions.  Ice packs will help.  Swelling and bruising can take several days to resolve.  9. Unless discharge instructions indicate otherwise, follow guidelines below  a. STERI-STRIPS - you may remove your outer bandages 48 hours after surgery, and you may shower at that time.  You have steri-strips (small skin tapes) in place directly over the incision.  These strips should be left on the skin for 7-10 days.   b. DERMABOND/SKIN GLUE - you may shower in 24 hours.  The glue will flake off over the next 2-3 weeks. 10. Any sutures or staples will be removed at the office during your follow-up visit.  ACTIVITIES 11. You may resume regular (light) daily activities beginning the next day--such as daily self-care, walking, climbing stairs--gradually increasing activities as tolerated.  You may have sexual intercourse when it is comfortable.  Refrain from any heavy lifting or straining until approved by your doctor. a. You may drive when you are no longer taking prescription pain medication, you can comfortably wear a seatbelt, and you can safely maneuver your car and apply brakes.  FOLLOW-UP 12. You should see your doctor in the office for a follow-up appointment approximately 2-3 weeks after your surgery.  You should have been given your post-op/follow-up appointment when your surgery was scheduled.  If you did not receive a post-op/follow-up appointment, make sure that you call for this appointment within a day or two after you arrive home to insure a convenient appointment time.  OTHER  INSTRUCTIONS 13. If you are breastfeeding - consult with your pediatrician if you breast feed while taking prescription narcotics (oxycodone). You may  Need to "pump & dump".   14. YOU SHOULD NOT TAKE MORE THAN 4,000 MG OR 4 GRAMS OF TYLENOL/ACETAMINOPHEN  FROM ALL SOURCES IN A 24 HR PERIOD    WHEN TO CALL YOUR DOCTOR: 1. Fever over 101.0 2. Inability to urinate 3. Continued bleeding from incision. 4. Increased pain, redness, or drainage from the incision. 5. Increasing abdominal pain  The clinic staff is available to answer your questions during regular business hours.  Please don't hesitate to call and ask to speak to one of the nurses for clinical concerns.  If you have a medical emergency, go to the nearest emergency room or call 911.  A surgeon from [facility] is always on call at the hospital. [address] [place], [place], [place]  [address] ? [address] [place], [place], [place]   [address] [phone] ? [phone] ? FAX [phone] Web site: [email or web address]

## 2020-09-20 NOTE — Anesthesia Postprocedure Evaluation (Signed)
Anesthesia Post Note  [name]  Procedure(s) Performed: XI ROBOTIC ASSISTED LAPAROSCOPIC CHOLECYSTECTOMY WITH ICG (N/A Abdomen)     Patient location during evaluation: PACU Anesthesia Type: General Level of consciousness: awake and alert Pain management: pain level controlled Vital Signs Assessment: post-procedure vital signs reviewed and stable Respiratory status: spontaneous breathing, nonlabored ventilation, respiratory function stable and patient connected to nasal cannula oxygen Cardiovascular status: blood pressure returned to baseline and stable Postop Assessment: no apparent nausea or vomiting Anesthetic complications: no   No complications documented.  Last Vitals:  Vitals:   09/20/20 1400 09/20/20 1415  BP: 131/82 124/80  Pulse: 83 98  Resp:    Temp:  36.7 C  SpO2: 100% 100%    Last Pain:  Vitals:   09/20/20 1415  TempSrc:   PainSc: 4                  Alira Fretwell S

## 2020-09-20 NOTE — H&P (Signed)
CC: here for Gallbladder surgery  Requesting provider: n/a  HPI: Julie Webb is an 29 y.o. female who is here for robotic/minimal invasive cholecystectomy.  She denies any changes since I saw her in the clinic back in the spring.  However she states that she is having more episodes's with respect to nausea after eating.  She states that it is harder to find foods that do not cause nausea after eating.  Denies any trips to the emergency room hospital.  Denies any new medications.  The patient is a 29 year old female who presents for evaluation of gall stones. she is referred by Julie Webb for evaluation of gallstones. She states that she started having issues during her most recent pregnancy. She stated that she had constant right-sided pain. It was felt to be in her ribs as well as underneath her ribs. She had also have episodes of nausea. It was initially felt that she may have rib issues due to her pregnancy. She states that she is referred for physical therapy. While they were doing abdominal physical therapy and palpating in her right upper quadrant they found an area that she was very symptomatic in which was around the gallbladder area. This prompted an abdominal ultrasound which I reviewed. It showed gallstones and gallbladder sludge. This was done on April 1. She had a stone up to 1.1 cm in size. She had a negative right rib series on March 31 which I reviewed. She states that her discomfort has improved since her delivery. Eating did not really exacerbate her pain. She was still have episodes of about once a month for she will get pain on her right side nausea and some vomiting. It will occasionally radiate to her back. She felt bloated during her pregnancy. It felt like a band on her right side. She denies any prior abdominal surgery. Her child is 65 months old. She is breast-feeding. She no longer has any additional pal time right now  Past Medical History:    Diagnosis Date  . Anxiety   . Depression   . Family history of adverse reaction to anesthesia    mother- N/V   . Glomerulonephritis, poststreptococcal    age 10  . 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 Migraines   . Vaginal Pap smear, abnormal     Past Surgical History:  Procedure Laterality Date  . NO PAST SURGERIES      Family History  Problem Relation Age of Onset  . Hyperlipidemia Mother   . Mental illness Mother   . Fibroids Mother        uterine  . Hypertension Father   . Heart attack Father   . Heart disease Maternal Grandmother   . Hyperlipidemia Maternal Grandmother   . Hypertension Maternal Grandmother   . Heart disease Maternal Grandfather   . Hyperlipidemia Maternal Grandfather   . Hypertension Maternal Grandfather   . Pancreatic cancer Paternal Grandmother   . Mesothelioma Paternal Grandfather     Social:  reports that she has never smoked. She has never used smokeless tobacco. She reports current alcohol use. She reports that she does not use drugs.  Allergies:  Allergies  Allergen Reactions  . Ibuprofen Other (See Comments)    Kidney failure  . Phenergan [Promethazine Hcl] Other (See Comments)    Restlessness w/ IV Phenergen. Probable mild EPS. Tolerates if taken w/ Benadryl.     Medications: I have reviewed the patient's current medications.   ROS - all  of the below systems have been reviewed with the patient and positives are indicated with bold text General: chills, fever or night sweats Eyes: blurry vision or double vision ENT: epistaxis or sore throat Allergy/Immunology: itchy/watery eyes or nasal congestion Hematologic/Lymphatic: bleeding problems, blood clots or swollen lymph nodes Endocrine: temperature intolerance or unexpected weight changes Breast: new or changing breast lumps or nipple discharge Resp: cough, shortness of breath, or wheezing CV: chest pain or dyspnea on exertion GI: as per HPI GU: dysuria, trouble voiding,  or hematuria MSK: joint pain or joint stiffness Neuro: TIA or stroke symptoms Derm: pruritus and skin lesion changes Psych: anxiety and depression  PE Blood pressure 113/74, pulse 94, temperature 98.1 F (36.7 C), temperature source Oral, resp. rate 15, height 5\' 4"  (1.626 m), weight 59 kg, SpO2 98 %, currently breastfeeding. Constitutional: NAD; conversant; no deformities Eyes: Moist conjunctiva; no lid lag; anicteric; PERRL Neck: Trachea midline; no thyromegaly Lungs: Normal respiratory effort; no tactile fremitus CV: RRR; no palpable thrills; no pitting edema GI: Abd soft, nt, nd; no palpable hepatosplenomegaly MSK: Normal gait; no clubbing/cyanosis Psychiatric: Appropriate affect; alert and oriented x3 Lymphatic: No palpable cervical or axillary lymphadenopathy Skin:no rash/edema/lesions  Results for orders placed or performed during the hospital encounter of 09/20/20 (from the past 48 hour(s))  Pregnancy, urine per protocol     Status: None   Collection Time: 09/20/20  9:57 AM  Result Value Ref Range   Preg Test, Ur NEGATIVE NEGATIVE    Comment:        THE SENSITIVITY OF THIS METHODOLOGY IS >20 mIU/mL. Performed at [facility], [place] [address], [place], [place] [address]     No results found.  Imaging: reviewed  A/P: Julie Webb is an 29 y.o. female with  Symptomatic cholelithiasis  2 OR for robotic cholecystectomy IV antibiotic Enhanced recovery Discussed postoperative pain control especially with breast-feeding All questions asked and answered  Leighton Ruff. Redmond Pulling, MD, FACS General, Bariatric, & Minimally Invasive Surgery [facility], [place]

## 2020-09-20 NOTE — Anesthesia Procedure Notes (Signed)
Procedure Name: Intubation Date/Time: 09/20/2020 12:18 PM Performed by: Lavina Hamman, CRNA Pre-anesthesia Checklist: Patient identified, Emergency Drugs available, Suction available, Patient being monitored and Timeout performed Patient Re-evaluated:Patient Re-evaluated prior to induction Oxygen Delivery Method: Circle system utilized Preoxygenation: Pre-oxygenation with 100% oxygen Induction Type: IV induction Ventilation: Mask ventilation without difficulty Laryngoscope Size: Mac and 3 Grade View: Grade I Tube type: Oral Tube size: 7.0 mm Number of attempts: 1 Airway Equipment and Method: Stylet Placement Confirmation: ETT inserted through vocal cords under direct vision,  positive ETCO2,  CO2 detector and breath sounds checked- equal and bilateral Secured at: 21 cm Tube secured with: Tape Dental Injury: Teeth and Oropharynx as per pre-operative assessment  Comments: ATOI

## 2020-09-20 NOTE — Transfer of Care (Signed)
Immediate Anesthesia Transfer of Care Note  [name]  Procedure(s) Performed: Procedure(s): XI ROBOTIC ASSISTED LAPAROSCOPIC CHOLECYSTECTOMY WITH ICG (N/A)  Patient Location: PACU  Anesthesia Type:General  Level of Consciousness:  sedated, patient cooperative and responds to stimulation  Airway & Oxygen Therapy:Patient Spontanous Breathing and Patient connected to face mask oxgen  Post-op Assessment:  Report given to PACU RN and Post -op Vital signs reviewed and stable  Post vital signs:  Reviewed and stable  Last Vitals:  Vitals:   09/20/20 1016 09/20/20 1323  BP: 113/74   Pulse: 94 (P) 77  Resp: 15   Temp: 36.7 C (P) 37 C  SpO2: 50%     Complications: No apparent anesthesia complications

## 2020-09-20 NOTE — Anesthesia Preprocedure Evaluation (Signed)
Anesthesia Evaluation  Patient identified by MRN, date of birth, ID band Patient awake    Reviewed: Allergy & Precautions, NPO status , Patient's Chart, lab work & pertinent test results  Airway Mallampati: II  TM Distance: >3 FB Neck ROM: Full    Dental no notable dental hx.    Pulmonary neg pulmonary ROS,    Pulmonary exam normal breath sounds clear to auscultation       Cardiovascular negative cardio ROS Normal cardiovascular exam Rhythm:Regular Rate:Normal     Neuro/Psych negative neurological ROS  negative psych ROS   GI/Hepatic negative GI ROS, Neg liver ROS,   Endo/Other  negative endocrine ROS  Renal/GU negative Renal ROS  negative genitourinary   Musculoskeletal negative musculoskeletal ROS (+)   Abdominal   Peds negative pediatric ROS (+)  Hematology negative hematology ROS (+)   Anesthesia Other Findings   Reproductive/Obstetrics negative OB ROS                             Anesthesia Physical Anesthesia Plan  ASA: I  Anesthesia Plan: General   Post-op Pain Management:    Induction: Intravenous  PONV Risk Score and Plan: 3 and Ondansetron, Dexamethasone, Midazolam and Treatment may vary due to age or medical condition  Airway Management Planned: Oral ETT  Additional Equipment:   Intra-op Plan:   Post-operative Plan: Extubation in OR  Informed Consent: I have reviewed the patients History and Physical, chart, labs and discussed the procedure including the risks, benefits and alternatives for the proposed anesthesia with the patient or authorized representative who has indicated his/her understanding and acceptance.     Dental advisory given  Plan Discussed with: CRNA and Surgeon  Anesthesia Plan Comments:         Anesthesia Quick Evaluation

## 2020-09-20 NOTE — Op Note (Signed)
Robotic/Xi Cholecystectomy with Firefly immunofluorescence procedure Note   Indications: This patient presents with symptomatic gallbladder disease and will undergo laparoscopic cholecystectomy.  Pre-operative Diagnosis: symptomatic cholelithiasis   Post-operative Diagnosis: same, probable chronic calculous cholecystitis   Surgeon: Greer Pickerel MD FACS   Assistants: none   Anesthesia: General endotracheal anesthesia   Procedure Details  The patient was seen again in the Holding Room. The risks, benefits, complications, treatment options, and expected outcomes were discussed with the patient. The possibilities of reaction to medication, pulmonary aspiration, perforation of viscus, bleeding, recurrent infection, finding a normal gallbladder, the need for additional procedures, failure to diagnose a condition, the possible need to convert to an open procedure, and creating a complication requiring transfusion or operation were discussed with the patient. The likelihood of improving the patient's symptoms with return to their baseline status is good.  The patient and/or family concurred with the proposed plan, giving informed consent. The site of surgery properly noted. The patient was taken to Operating Room, identified as Julie Webb and the procedure verified as robotic Cholecystectomy. A Time Out was held and the above information confirmed. Antibiotic prophylaxis was administered.    Prior to the induction of general anesthesia, antibiotic prophylaxis was administered. General endotracheal anesthesia was then administered and tolerated well. After the induction, the abdomen was prepped with Chloraprep and draped in the sterile fashion. The patient was positioned in the supine position.   Local anesthetic agent was injected into the skin near the umbilicus and an incision made. We dissected down to the abdominal fascia with blunt dissection.  The fascia was incised vertically and we entered  the peritoneal cavity bluntly.  A pursestring suture of 0-Vicryl was placed around the fascial opening.  A 94mm robotic Hasson cannula was inserted and secured with the stay suture.  Pneumoperitoneum was then created with CO2 and tolerated well without any adverse changes in the patient's vital signs.  The robotic camera was inserted and the abdominal cavity was surveilled.  There is no evidence of injury to surrounding structures.  I then placed three additional 8 mm robotic trochars in a horizontal line in the lower abdomen.  Two trochars were placed on the right side of the abdomen and one in the left lower quadrant all under direct visualization.  I then infiltrated local along the lateral abdominal walls as a tap block.     We positioned the patient in reverse Trendelenburg, tilted slightly to the patient's left.    We then brought in the [place] Webb I robot and deployed it for upper abdominal surgery from patient's right.  The robotic camera was inserted and the anatomy was targeted.  The robotic arms were attached to the robotic trochars.  A forced bipolar, prograsp, and robotic hook were inserted under direct visualization.   I then scrubbed out and went to the robotic console while the scrub technician stayed at the bedside.    The gallbladder was identified, the fundus grasped and retracted cephalad. Adhesions were lysed bluntly and with the electrocautery where indicated, taking care not to injure any adjacent organs or viscus. The infundibulum was grasped and retracted laterally, exposing the peritoneum overlying the triangle of Calot. This was then divided and exposed in a blunt fashion.  Firefly immunofluorescent was turned on several times during the procedure as a secondary confirmation of the location of the cystic duct and common bile duct.  There was no obvious filling defect in the cystic duct or common bile  duct.  A critical view of the cystic duct and cystic artery was obtained. She also  had a small posterior cystic artery branch going in the gallbladder.  The cystic duct was clearly identified and bluntly dissected circumferentially.  My assistant performed instrument exchanges between the hook and the robotic hemoclips   The cystic duct was then 2 ligated with clips proximally and 1 distally and divided. The cystic artery (both anterior and posterior branches - clearly going into the gallbladder)  which had been identified & dissected free was ligated with clips and divided as well.    The gallbladder was dissected from the liver bed in retrograde fashion with the electrocautery.  There was some spillage of bile from the gallbladder.  There was no stone spiallge.  The liver bed was irrigated and inspected. Hemostasis was achieved with the electrocautery.    The robot was undocked.  I scrubbed back in. The hassan trocar was temporarily removed in order to place a Ecco bag under direct visualization in the abdominal cavity. Hasson trocar replaced. gallbladder was placed in the ecco bag. Copious irrigation was utilized and was repeatedly aspirated until clear.   No bleeding or bile leak noticed. The ecco bag and gallbladder were then removed through the umbilical port site.     the pursestring suture was used to close the umbilical fascia.    We again inspected the right upper quadrant for hemostasis.  The umbilical closure was inspected and there was no air leak and nothing trapped within the closure. Pneumoperitoneum was released as we removed the trocars.  4-0 Monocryl was used to close the skin.   steri-strips, and clean dressings were applied. The patient was then extubated and brought to the recovery room in stable condition. Instrument, sponge, and needle counts were correct at closure and at the conclusion of the case.    Findings: Probable chronic Cholecystitis with Cholelithiasis   Estimated Blood Loss: Minimal         Drains: none         Specimens: Gallbladder            Complications: None; patient tolerated the procedure well.         Disposition: PACU - hemodynamically stable.         Condition: stable  Julie Ruff. Redmond Pulling, MD, FACS General, Bariatric, & Minimally Invasive Surgery [facility], [place]

## 2020-09-21 LAB — SURGICAL PATHOLOGY

## 2020-10-04 ENCOUNTER — Other Ambulatory Visit (HOSPITAL_COMMUNITY): Payer: Self-pay | Admitting: Dentist

## 2020-10-04 MED FILL — AMOXICILLIN 500 MG CAPSULE: 500 | 7 days supply | Qty: 21 | Fill #0

## 2020-10-04 MED FILL — CHLORHEXIDINE 0.12% RINSE: 0.12 | 15 days supply | Qty: 473 | Fill #0

## 2020-10-04 MED FILL — HYDROCODON-APAP 5-325: 5-325 | 2 days supply | Qty: 7 | Fill #0

## 2020-10-04 MED FILL — METHYLPREDNISOLONE 4 MG TBP: 4 | 6 days supply | Qty: 21 | Fill #0

## 2020-10-30 MED FILL — buPROPion HCL ER (XL) 300 M: 300 | 90 days supply | Qty: 90 | Fill #1

## 2020-10-30 MED FILL — DULoxetine HCL 60 MG CPEP: 60 | 90 days supply | Qty: 90 | Fill #1

## 2020-12-19 ENCOUNTER — Encounter: Payer: No Typology Code available for payment source | Admitting: Family

## 2021-01-15 ENCOUNTER — Encounter: Payer: No Typology Code available for payment source | Admitting: Family

## 2021-01-23 ENCOUNTER — Encounter: Payer: No Typology Code available for payment source | Admitting: Family

## 2021-02-15 ENCOUNTER — Encounter: Payer: Self-pay | Admitting: Adult Health

## 2021-02-15 ENCOUNTER — Other Ambulatory Visit (HOSPITAL_COMMUNITY): Payer: Self-pay

## 2021-02-15 ENCOUNTER — Ambulatory Visit (INDEPENDENT_AMBULATORY_CARE_PROVIDER_SITE_OTHER): Payer: No Typology Code available for payment source | Admitting: Adult Health

## 2021-02-15 ENCOUNTER — Other Ambulatory Visit: Payer: Self-pay

## 2021-02-15 DIAGNOSIS — F4010 Social phobia, unspecified: Secondary | ICD-10-CM

## 2021-02-15 DIAGNOSIS — F330 Major depressive disorder, recurrent, mild: Secondary | ICD-10-CM | POA: Diagnosis not present

## 2021-02-15 DIAGNOSIS — F411 Generalized anxiety disorder: Secondary | ICD-10-CM

## 2021-02-15 DIAGNOSIS — F410 Panic disorder [episodic paroxysmal anxiety] without agoraphobia: Secondary | ICD-10-CM | POA: Diagnosis not present

## 2021-02-15 MED ORDER — BUPROPION HCL ER (XL) 150 MG PO TB24
150.0000 mg | ORAL_TABLET | Freq: Every day | ORAL | 1 refills | Status: DC
  Filled 2021-02-15: qty 90, 90d supply, fill #0

## 2021-02-15 MED ORDER — BUPROPION HCL ER (XL) 300 MG PO TB24
300.0000 mg | ORAL_TABLET | Freq: Every day | ORAL | 1 refills | Status: DC
  Filled 2021-02-15: qty 90, 90d supply, fill #0

## 2021-02-15 MED ORDER — DULOXETINE HCL 60 MG PO CPEP
60.0000 mg | ORAL_CAPSULE | Freq: Every day | ORAL | 1 refills | Status: DC
  Filled 2021-02-15: qty 90, 90d supply, fill #0

## 2021-02-15 MED ORDER — REXULTI 0.5 MG PO TABS: ORAL_TABLET | ORAL | 0 refills | Status: DC

## 2021-02-15 NOTE — Progress Notes (Signed)
Julie Webb [id number] 03/03/1991 30 y.o.  Subjective:   Patient ID:  Julie Webb is a 30 y.o. (DOB 18-Jan-1991) female.  Chief Complaint: No chief complaint on file.   HPI [address] presents to the office today for follow-up of SAD, Panic disorder, GAD, MDD.  Describes mood today as "ok". Pleasant. Tearful. Mood symptoms - reports depression, anxiety, and irritability. Stating "I've been really struggling over the past several months". Currently taking Wellbutrin XL 300mg  and Cymbalta 60mg  daily. Reports loss of libido. She and husband working on relationship. Has a 67 month old son. Getting out some. Parents moving closer.Lacking interest and motivation. Taking medications as prescribed.  Energy levels stable. Active, does not have a regular exercise routine. Works full-time  Enjoys some usual interests and activities. Spending time with family. Appetite adequate.  Weight stable - 134 pounds. Sleeps well most nights. Averages 6 to 8 hours. Focus and concentration stable. Completing tasks. Managing aspects of household. Working 2 days a week - Warden/ranger.  Denies SI or HI.  Denies AH or VH.   [place] Office Visit from 11/12/2017 in [place] at [facility]  PHQ-2 Total Score 0       Review of Systems:  Review of Systems  Musculoskeletal: Negative for gait problem.  Neurological: Negative for tremors.  Psychiatric/Behavioral:       Please refer to HPI    Medications: I have reviewed the patient's current medications.  Current Outpatient Medications  Medication Sig Dispense Refill  . buPROPion (WELLBUTRIN XL) 300 MG 24 hr tablet TAKE 1 TABLET (300 MG TOTAL) BY MOUTH DAILY AFTER BREAKFAST. 90 tablet 1  . butalbital-acetaminophen-caffeine (FIORICET, ESGIC) 50-325-40 MG tablet Take 1 tablet by mouth every 6 (six) hours as needed for headache. 12 tablet 5  . chlorhexidine (PERIDEX) 0.12 % solution RINSE MOUTH WITH 15ML (1 CAPFUL)  FOR 30 SECONDS IN THE MORNING AND EVENING AFTER TOOTHBRUSHING. EXPECTORATE AFTER RINSING, DO NOT SWALLOW. 473 mL 0  . cholecalciferol (VITAMIN D3) 25 MCG (1000 UNIT) tablet Take 1,000 Units by mouth daily.    . DULoxetine (CYMBALTA) 60 MG capsule TAKE 1 CAPSULE (60 MG TOTAL) BY MOUTH DAILY AFTER BREAKFAST. 90 capsule 1  . Prenat w/o A-FeCbn-DSS-FA-DHA (PRENAISSANCE PLUS) 28-1-250 MG CAPS Take 1 capsule by mouth daily.    . vitamin B-12 (CYANOCOBALAMIN) 1000 MCG tablet Take 1,000 mcg by mouth daily.     No current facility-administered medications for this visit.    Medication Side Effects: None  Allergies:  Allergies  Allergen Reactions  . Ibuprofen Other (See Comments)    Kidney failure  . Phenergan [Promethazine Hcl] Other (See Comments)    Restlessness w/ IV Phenergen. Probable mild EPS. Tolerates if taken w/ Benadryl.     Past Medical History:  Diagnosis Date  . Anxiety   . Depression   . Family history of adverse reaction to anesthesia    mother- N/V   . Glomerulonephritis, poststreptococcal    age 40  . 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 Migraines   . Vaginal Pap smear, abnormal     Past Medical History, Surgical history, Social history, and Family history were reviewed and updated as appropriate.   Please see review of systems for further details on the patient's review from today.   Objective:   Physical Exam:  There were no vitals taken for this visit.  Physical Exam Constitutional:      General: She  is not in acute distress. Musculoskeletal:        General: No deformity.  Neurological:     Mental Status: She is alert and oriented to person, place, and time.     Coordination: Coordination normal.  Psychiatric:        Attention and Perception: Attention and perception normal. She does not perceive auditory or visual hallucinations.        Mood and Affect: Mood normal. Mood is not anxious or depressed. Affect is not labile, blunt, angry or  inappropriate.        Speech: Speech normal.        Behavior: Behavior normal.        Thought Content: Thought content normal. Thought content is not paranoid or delusional. Thought content does not include homicidal or suicidal ideation. Thought content does not include homicidal or suicidal plan.        Cognition and Memory: Cognition and memory normal.        Judgment: Judgment normal.     Comments: Insight intact     Lab Review:     Component Value Date/Time   NA 139 09/11/2020 1145   K 3.9 09/11/2020 1145   CL 105 09/11/2020 1145   CO2 28 09/11/2020 1145   GLUCOSE 96 09/11/2020 1145   BUN 8 09/11/2020 1145   CREATININE 0.72 09/11/2020 1145   CALCIUM 9.2 09/11/2020 1145   PROT 7.5 09/11/2020 1145   ALBUMIN 4.5 09/11/2020 1145   AST 17 09/11/2020 1145   ALT 11 09/11/2020 1145   ALKPHOS 62 09/11/2020 1145   BILITOT 0.4 09/11/2020 1145   GFRNONAA >60 09/11/2020 1145   GFRAA >60 06/05/2019 1928       Component Value Date/Time   WBC 5.0 09/11/2020 1145   RBC 4.96 09/11/2020 1145   HGB 14.6 09/11/2020 1145   HCT 44.9 09/11/2020 1145   PLT 247 09/11/2020 1145   MCV 90.5 09/11/2020 1145   MCH 29.4 09/11/2020 1145   MCHC 32.5 09/11/2020 1145   RDW 12.9 09/11/2020 1145   LYMPHSABS 2.5 09/11/2020 1145   MONOABS 0.5 09/11/2020 1145   EOSABS 0.2 09/11/2020 1145   BASOSABS 0.1 09/11/2020 1145    No results found for: POCLITH, LITHIUM   No results found for: PHENYTOIN, PHENOBARB, VALPROATE, CBMZ   .res Assessment: Plan:    Plan:  PDMP reviewed  1. Continue Cymbalta 60mg  daily 2. Increase Wellbutrin XL300mg  to XL 450mg  daily - denies seizure history 3. Add Rexulti 0.5mg  daily - samples given.  Patient seen for 30 minutes and time spent discussing treatment options.  RTC 4 weeks  Patient advised to contact office with any questions, adverse effects, or acute worsening in signs and symptoms.  Discussed potential metabolic side effects associated with atypical  antipsychotics, as well as potential risk for movement side effects. Advised pt to contact office if movement side effects occur.   Diagnoses and all orders for this visit:  Mild recurrent major depression ([place])  Generalized anxiety disorder  Social anxiety disorder  Panic disorder     Please see After Visit Summary for patient specific instructions.  No future appointments.  No orders of the defined types were placed in this encounter.   -------------------------------

## 2021-03-05 ENCOUNTER — Other Ambulatory Visit: Payer: Self-pay

## 2021-03-05 ENCOUNTER — Ambulatory Visit (INDEPENDENT_AMBULATORY_CARE_PROVIDER_SITE_OTHER): Payer: No Typology Code available for payment source | Admitting: Mental Health

## 2021-03-05 DIAGNOSIS — F330 Major depressive disorder, recurrent, mild: Secondary | ICD-10-CM | POA: Diagnosis not present

## 2021-03-05 NOTE — Progress Notes (Signed)
[facility] Counselor / Therapist Psychotherapy Note  Name: Julie Webb Date: 03/05/2021 MRN: [id number] DOB: 10-Dec-1990 PCP: Debbrah Alar, NP  Time spent: 53 minutes  Treatment:  Individual therapy   Mental Status Exam:   Appearance:   casual  Behavior:  wnl  Motor:  wnl  Speech/Language:   Clear and Coherent  Affect:  euthymic  Mood:  depressed  Thought process:  normal  Thought content:    WNL  Sensory/Perceptual disturbances:    WNL  Orientation:  x4  Attention:  Good  Concentration:  Good  Memory:  WNL  Fund of knowledge:   Good  Insight:    fair  Judgment:   Fair  Impulse Control:  Fair   Reported Symptoms:  Angry/irritable, confused, depressed, anxious, sleep problems, appetite disturbance, ruminations, feelings of panic  Risk Assessment: Danger to Self:  No Self-injurious Behavior: No Danger to Others: No Duty to Warn:no Physical Aggression / Violence:No  Access to Firearms a concern: No  Gang Involvement:No  Patient / guardian was educated about steps to take if suicide or homicide risk level increases between visits: yes While future psychiatric events cannot be accurately predicted, the patient does not currently require acute inpatient psychiatric care and does not currently meet [facility] involuntary commitment criteria.  Subjective: Patient presents for session sharing progress as her last session was over 1 year ago.  She shared significant events, her and her husband were coming their first child who is about 47 months old currently.  She went on to share the challenges related to health and her marital relationship that have been present an ongoing over the past 1 to 2 years.  She stated that she and her husband are trying to work on their relationship, he is now going to therapy and she wants to restart therapy for herself due to unresolved issues, specifically the continued pain of her husband's history of infidelity and unresolved feelings  she carries.  We plan to further explore more with the patient next session and encouraged her to journal between visits.   Interventions: Assessment, CBT, supportive therapy  Diagnoses: MDD, Severe, w/o psychotic features;  GAD  Individualized Plan of Care:  1. Patient to engage psychiatric evaluation and follow medication regimen.  2. Patient to engage in individual psychotherapy.  3. Patient to identify and apply coping skills learned in session to decrease symptoms  4. Patient to learn and apply CBT, coping skills and strategies learned in session.  5. Patient to contact this office, go to the local ED or call 911 if a crisis or emergency develops between visits.   [place], [facility]

## 2021-03-08 ENCOUNTER — Other Ambulatory Visit: Payer: Self-pay

## 2021-03-08 ENCOUNTER — Ambulatory Visit (INDEPENDENT_AMBULATORY_CARE_PROVIDER_SITE_OTHER): Payer: No Typology Code available for payment source | Admitting: Adult Health

## 2021-03-08 ENCOUNTER — Encounter: Payer: Self-pay | Admitting: Adult Health

## 2021-03-08 ENCOUNTER — Other Ambulatory Visit (HOSPITAL_COMMUNITY): Payer: Self-pay

## 2021-03-08 DIAGNOSIS — F410 Panic disorder [episodic paroxysmal anxiety] without agoraphobia: Secondary | ICD-10-CM

## 2021-03-08 DIAGNOSIS — F411 Generalized anxiety disorder: Secondary | ICD-10-CM | POA: Diagnosis not present

## 2021-03-08 DIAGNOSIS — F330 Major depressive disorder, recurrent, mild: Secondary | ICD-10-CM

## 2021-03-08 DIAGNOSIS — F4010 Social phobia, unspecified: Secondary | ICD-10-CM | POA: Diagnosis not present

## 2021-03-08 MED ORDER — REXULTI 0.5 MG PO TABS
ORAL_TABLET | ORAL | 0 refills | Status: DC
Start: 2021-03-08 — End: 2021-03-08

## 2021-03-08 MED ORDER — REXULTI 0.5 MG PO TABS
0.5000 mg | ORAL_TABLET | Freq: Every day | ORAL | 0 refills | Status: DC
  Filled 2021-03-08: qty 90, 90d supply, fill #0

## 2021-03-08 NOTE — Progress Notes (Signed)
Julie Webb [id number] 11-Sep-1991 30 y.o.  Subjective:   Patient ID:  Julie Webb is a 30 y.o. (DOB May 06, 1991) female.  Chief Complaint: No chief complaint on file.   HPI 16 Julie Webb presents to the office today for follow-up of SAD, Panic disorder, GAD, MDD.  Describes mood today as "ok". Pleasant. Tearful. Mood symptoms - reports decreased depression, anxiety, and irritability. Stating "I'm doing ok". Feels like medications are helpful - "everyone around me says it's helping me". Still not wanting to go to the grocery store - anxious. Terrified of driving. Has been doing more things - looking forward to things. Stating "I'm more hopeful". Stating the depression is still there, but better". Helped parents move recently - moved from [place]. Concerns about Libido. Taking Wellbutrin at night - effecting sleep - will move to mornings.  She and husband working on relationship. Varying interest and motivation. Taking medications as prescribed.  Energy levels stable. Active, does not have a regular exercise routine. Works full-time  Enjoys some usual interests and activities. Married. Lives with husband and son. Spending time with family. Appetite adequate.  Weight stable - 134 pounds. Sleeps well most nights. Averages 6 to 8 hours. Focus and concentration stable. Completing tasks. Managing aspects of household. Working 2 days a week - [place] Webb.  Denies SI or HI.  Denies AH or VH.      [place] Office Visit from 11/12/2017 in [place] at [employer]  PHQ-2 Total Score 0       Review of Systems:  Review of Systems  Musculoskeletal: Negative for gait problem.  Neurological: Negative for tremors.  Psychiatric/Behavioral:       Please refer to HPI    Medications: I have reviewed the patient's current medications.  Current Outpatient Medications  Medication Sig Dispense Refill  . Brexpiprazole (REXULTI) 0.5 MG TABS Take one tablet  daily. 90 tablet 0  . buPROPion (WELLBUTRIN XL) 150 MG 24 hr tablet Take 1 tablet (150 mg total) by mouth daily. 90 tablet 1  . buPROPion (WELLBUTRIN XL) 300 MG 24 hr tablet Take 1 tablet (300 mg total) by mouth daily after breakfast 90 tablet 1  . butalbital-acetaminophen-caffeine (FIORICET, ESGIC) 50-325-40 MG tablet Take 1 tablet by mouth every 6 (six) hours as needed for headache. 12 tablet 5  . chlorhexidine (PERIDEX) 0.12 % solution RINSE MOUTH WITH 15ML (1 CAPFUL) FOR 30 SECONDS IN THE MORNING AND EVENING AFTER TOOTHBRUSHING. EXPECTORATE AFTER RINSING, DO NOT SWALLOW. 473 mL 0  . cholecalciferol (VITAMIN D3) 25 MCG (1000 UNIT) tablet Take 1,000 Units by mouth daily.    . DULoxetine (CYMBALTA) 60 MG capsule Take 1 capsule (60 mg total) by mouth daily after breakfast. 90 capsule 1  . Prenat w/o A-FeCbn-DSS-FA-DHA (PRENAISSANCE PLUS) 28-1-250 MG CAPS Take 1 capsule by mouth daily.    . vitamin B-12 (CYANOCOBALAMIN) 1000 MCG tablet Take 1,000 mcg by mouth daily.     No current facility-administered medications for this visit.    Medication Side Effects: None  Allergies:  Allergies  Allergen Reactions  . Ibuprofen Other (See Comments)    Kidney failure  . Phenergan [Promethazine Hcl] Other (See Comments)    Restlessness w/ IV Phenergen. Probable mild EPS. Tolerates if taken w/ Benadryl.     Past Medical History:  Diagnosis Date  . Anxiety   . Depression   . Family history of adverse reaction to anesthesia    mother- N/V   .  Glomerulonephritis, poststreptococcal    age 67  . 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 Migraines   . Vaginal Pap smear, abnormal     Past Medical History, Surgical history, Social history, and Family history were reviewed and updated as appropriate.   Please see review of systems for further details on the patient's review from today.   Objective:   Physical Exam:  There were no vitals taken for this visit.  Physical Exam Constitutional:       General: She is not in acute distress. Musculoskeletal:        General: No deformity.  Neurological:     Mental Status: She is alert and oriented to person, place, and time.     Coordination: Coordination normal.  Psychiatric:        Attention and Perception: Attention and perception normal. She does not perceive auditory or visual hallucinations.        Mood and Affect: Mood normal. Mood is not anxious or depressed. Affect is not labile, blunt, angry or inappropriate.        Speech: Speech normal.        Behavior: Behavior normal.        Thought Content: Thought content normal. Thought content is not paranoid or delusional. Thought content does not include homicidal or suicidal ideation. Thought content does not include homicidal or suicidal plan.        Cognition and Memory: Cognition and memory normal.        Judgment: Judgment normal.     Comments: Insight intact     Lab Review:     Component Value Date/Time   NA 139 09/11/2020 1145   K 3.9 09/11/2020 1145   CL 105 09/11/2020 1145   CO2 28 09/11/2020 1145   GLUCOSE 96 09/11/2020 1145   BUN 8 09/11/2020 1145   CREATININE 0.72 09/11/2020 1145   CALCIUM 9.2 09/11/2020 1145   PROT 7.5 09/11/2020 1145   ALBUMIN 4.5 09/11/2020 1145   AST 17 09/11/2020 1145   ALT 11 09/11/2020 1145   ALKPHOS 62 09/11/2020 1145   BILITOT 0.4 09/11/2020 1145   GFRNONAA >60 09/11/2020 1145   GFRAA >60 06/05/2019 1928       Component Value Date/Time   WBC 5.0 09/11/2020 1145   RBC 4.96 09/11/2020 1145   HGB 14.6 09/11/2020 1145   HCT 44.9 09/11/2020 1145   PLT 247 09/11/2020 1145   MCV 90.5 09/11/2020 1145   MCH 29.4 09/11/2020 1145   MCHC 32.5 09/11/2020 1145   RDW 12.9 09/11/2020 1145   LYMPHSABS 2.5 09/11/2020 1145   MONOABS 0.5 09/11/2020 1145   EOSABS 0.2 09/11/2020 1145   BASOSABS 0.1 09/11/2020 1145    No results found for: POCLITH, LITHIUM   No results found for: PHENYTOIN, PHENOBARB, VALPROATE, CBMZ   .res Assessment:  Plan:    Plan:  PDMP reviewed  1. Continue Cymbalta 60mg  daily 2. Continue Wellbutrin XL 450mg  daily - denies seizure history - move to morning 3. Continue Rexulti 0.5mg  daily - samples given.  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  RTC 4 weeks  Patient advised to contact office with any questions, adverse effects, or acute worsening in signs and symptoms.  Discussed potential metabolic side effects associated with atypical antipsychotics, as well as potential risk for movement side effects. Advised pt to contact office if movement side effects occur.     Diagnoses and  all orders for this visit:  Mild recurrent major depression ([place]) -     Discontinue: Brexpiprazole (REXULTI) 0.5 MG TABS; Take one tablet daily. -     Brexpiprazole (REXULTI) 0.5 MG TABS; Take one tablet daily.  Generalized anxiety disorder  Social anxiety disorder  Panic disorder     Please see After Visit Summary for patient specific instructions.  Future Appointments  Date Time Provider [place]  03/19/2021  9:00 AM An[place], [facility] CP-CP None    No orders of the defined types were placed in this encounter.   -------------------------------

## 2021-03-19 ENCOUNTER — Other Ambulatory Visit: Payer: Self-pay

## 2021-03-19 ENCOUNTER — Ambulatory Visit (INDEPENDENT_AMBULATORY_CARE_PROVIDER_SITE_OTHER): Payer: No Typology Code available for payment source | Admitting: Mental Health

## 2021-03-19 DIAGNOSIS — F330 Major depressive disorder, recurrent, mild: Secondary | ICD-10-CM

## 2021-03-19 NOTE — Progress Notes (Signed)
[facility] Counselor / Therapist Psychotherapy Note  Name: Julie Webb Date: 03/19/2021 MRN: [id number] DOB: 1991/07/06 PCP: Debbrah Alar, NP  Time spent: 53 minutes  Treatment:  Individual therapy   Mental Status Exam:   Appearance:   casual  Behavior:  wnl  Motor:  wnl  Speech/Language:   Clear and Coherent  Affect:  euthymic  Mood:  depressed  Thought process:  normal  Thought content:    WNL  Sensory/Perceptual disturbances:    WNL  Orientation:  x4  Attention:  Good  Concentration:  Good  Memory:  WNL  Fund of knowledge:   Good  Insight:    fair  Judgment:   Fair  Impulse Control:  Fair   Reported Symptoms:  Angry/irritable, confused, depressed, anxious, sleep problems, appetite disturbance, ruminations, feelings of panic  Risk Assessment: Danger to Self:  No Self-injurious Behavior: No Danger to Others: No Duty to Warn:no Physical Aggression / Violence:No  Access to Firearms a concern: No  Gang Involvement:No  Patient / guardian was educated about steps to take if suicide or homicide risk level increases between visits: yes While future psychiatric events cannot be accurately predicted, the patient does not currently require acute inpatient psychiatric care and does not currently meet [facility] involuntary commitment criteria.  Subjective: Patient presents for session. Patient shared recent events where she said that she and her husband continue to have marital strain, specifically they're having an argument last night. She stands that he feels she is more depressed due to her working third shift. She said she only works 2 days per week, Saturday and Sunday, and it's not as feeling depressed as a result of working third shift, more than she enjoys the shift due to her having less stress and anxiety during the shift. She went on to share how her husband "holds on to issues from the past"; patient went on to share some examples of how this occurs.  She  stated they struggle with intimacy at times, feels that they could best benefit from couples counseling and they have a scheduled session for tomorrow.  Patient questions why her husband is so upset with her about 2 to 3 days a week as he has also stated.  She continues to identify the need to further discuss her feelings regarding the history of his infidelity, she plans to bring this up in some with her couple session work.   Interventions: Assessment, relational intervening, supportive therapy  Diagnoses: MDD, Severe, w/o psychotic features;  GAD  Individualized Plan of Care:  1. Patient to engage psychiatric evaluation and follow medication regimen.  2. Patient to engage in individual psychotherapy.  3. Patient to identify and apply coping skills learned in session to decrease symptoms  4. Patient to learn and apply CBT, coping skills and strategies learned in session.  5. Patient to contact this office, go to the local ED or call 911 if a crisis or emergency develops between visits.   Anson [place], [facility]

## 2021-03-20 ENCOUNTER — Other Ambulatory Visit (HOSPITAL_COMMUNITY): Payer: Self-pay

## 2021-04-05 ENCOUNTER — Ambulatory Visit: Payer: No Typology Code available for payment source | Admitting: Adult Health

## 2021-04-09 ENCOUNTER — Ambulatory Visit: Payer: No Typology Code available for payment source | Admitting: Mental Health

## 2021-04-17 ENCOUNTER — Ambulatory Visit: Payer: No Typology Code available for payment source | Admitting: Adult Health

## 2021-05-10 ENCOUNTER — Ambulatory Visit (INDEPENDENT_AMBULATORY_CARE_PROVIDER_SITE_OTHER): Payer: No Typology Code available for payment source | Admitting: Adult Health

## 2021-05-10 ENCOUNTER — Other Ambulatory Visit (HOSPITAL_COMMUNITY): Payer: Self-pay

## 2021-05-10 ENCOUNTER — Other Ambulatory Visit: Payer: Self-pay

## 2021-05-10 ENCOUNTER — Encounter: Payer: Self-pay | Admitting: Adult Health

## 2021-05-10 DIAGNOSIS — F4010 Social phobia, unspecified: Secondary | ICD-10-CM | POA: Diagnosis not present

## 2021-05-10 DIAGNOSIS — F410 Panic disorder [episodic paroxysmal anxiety] without agoraphobia: Secondary | ICD-10-CM

## 2021-05-10 DIAGNOSIS — F411 Generalized anxiety disorder: Secondary | ICD-10-CM

## 2021-05-10 DIAGNOSIS — G4726 Circadian rhythm sleep disorder, shift work type: Secondary | ICD-10-CM

## 2021-05-10 DIAGNOSIS — F330 Major depressive disorder, recurrent, mild: Secondary | ICD-10-CM | POA: Diagnosis not present

## 2021-05-10 MED ORDER — ARMODAFINIL 150 MG PO TABS
150.0000 mg | ORAL_TABLET | Freq: Every day | ORAL | 2 refills | Status: DC
  Filled 2021-05-10: qty 30, 30d supply, fill #0
  Filled 2021-07-04: qty 30, 30d supply, fill #1

## 2021-05-10 MED ORDER — BUPROPION HCL ER (XL) 300 MG PO TB24
300.0000 mg | ORAL_TABLET | Freq: Every day | ORAL | 1 refills | Status: DC
  Filled 2021-05-10: qty 90, 90d supply, fill #0
  Filled 2021-09-18: qty 90, 90d supply, fill #1

## 2021-05-10 MED ORDER — BUPROPION HCL ER (XL) 150 MG PO TB24
150.0000 mg | ORAL_TABLET | Freq: Every day | ORAL | 1 refills | Status: DC
  Filled 2021-05-10: qty 90, 90d supply, fill #0
  Filled 2021-09-18: qty 90, 90d supply, fill #1

## 2021-05-10 MED ORDER — BREXPIPRAZOLE 1 MG PO TABS
1.0000 mg | ORAL_TABLET | Freq: Every day | ORAL | 1 refills | Status: DC
Start: 2021-05-10 — End: 2021-10-04
  Filled 2021-05-10: qty 90, 90d supply, fill #0
  Filled 2021-09-11: qty 90, 90d supply, fill #1

## 2021-05-10 MED ORDER — DULOXETINE HCL 60 MG PO CPEP
60.0000 mg | ORAL_CAPSULE | Freq: Every day | ORAL | 1 refills | Status: DC
  Filled 2021-05-10: qty 90, 90d supply, fill #0
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
## 2021-05-15 ENCOUNTER — Telehealth: Payer: Self-pay | Admitting: Adult Health

## 2021-05-15 NOTE — Telephone Encounter (Signed)
I have not encountered headache as a side effect. Has she taken anything to relieve symptoms?

## 2021-05-15 NOTE — Telephone Encounter (Signed)
Pt stated she is taking tylenol but it wears off.She will follow up with PCP if you don't have any other suggestions.

## 2021-05-15 NOTE — Telephone Encounter (Signed)
Or she could try going back down on the medication and see if that helps.

## 2021-05-15 NOTE — Telephone Encounter (Signed)
Pt stated she is not sure if the headaches are coming from med or not.Is this a common side effect?I asked her has she had problems in the past with migraines and she said yes.

## 2021-05-15 NOTE — Telephone Encounter (Signed)
Pt called and said that she was taking 0.5 mg of rexulti and increased to one mg on Friday. Ever since she began that dose she has had headaches. Please call her at [phone]

## 2021-05-16 NOTE — Telephone Encounter (Signed)
Noted  

## 2021-05-16 NOTE — Telephone Encounter (Signed)
Pt stated she will stay at current dose.She thinks its related to something else.She does not have a headache this morning

## 2021-05-21 ENCOUNTER — Ambulatory Visit (INDEPENDENT_AMBULATORY_CARE_PROVIDER_SITE_OTHER): Payer: No Typology Code available for payment source | Admitting: Mental Health

## 2021-05-21 ENCOUNTER — Other Ambulatory Visit: Payer: Self-pay

## 2021-05-21 DIAGNOSIS — F330 Major depressive disorder, recurrent, mild: Secondary | ICD-10-CM

## 2021-05-21 NOTE — Progress Notes (Signed)
[facility] Counselor / Therapist Psychotherapy Note  Name: Julie Webb Date: 05/21/2021 MRN: [id number] DOB: 22-Sep-1991 PCP: Debbrah Alar, NP  Time spent: 54 minutes  Treatment:  Individual therapy   Mental Status Exam:    Appearance:   casual  Behavior:  wnl  Motor:  wnl  Speech/Language:   Clear and Coherent  Affect:  euthymic  Mood:  depressed  Thought process:  normal  Thought content:    WNL  Sensory/Perceptual disturbances:    WNL  Orientation:  x4  Attention:  Good  Concentration:  Good  Memory:  WNL  Fund of knowledge:   Good  Insight:    fair  Judgment:   Fair  Impulse Control:  Fair   Reported Symptoms:  Angry/irritable, confused, depressed, anxious, sleep problems, appetite disturbance, ruminations, feelings of panic  Risk Assessment: Danger to Self:  No Self-injurious Behavior: No Danger to Others: No Duty to Warn:no Physical Aggression / Violence:No  Access to Firearms a concern: No  Gang Involvement:No  Patient / guardian was educated about steps to take if suicide or homicide risk level increases between visits: yes While future psychiatric events cannot be accurately predicted, the patient does not currently require acute inpatient psychiatric care and does not currently meet [facility] involuntary commitment criteria.  Subjective: Patient presents for session.   She shared how she continues to work third shift, 2 days a week on weekends.  She went on to share how she is trying a new medication that she hopes will help her be more awake and alert on days with which she is trying to rebound toward being awake earlier during the week when she tries to shift her sleep schedule back to daytime.  She stated her husband continues to express wanting to have more time together however, she states that she does not fully understand as she is off 5 days throughout the week.  She went on to identify the need to have more discussions with her husband  regarding the ongoing feelings she has about dealing with his infidelity.  She stated that when she makes attempts, he does listen but she would want him to engage more in the discussion with her as she stated she has ongoing distressful feelings that persist.  Facilitated her identifying needs, which she plans to follow through with continuing to have discussions with her husband to further express her feelings in an effort to move forward with their relationship.  Interventions: Assessment, relational intervening, supportive therapy  Diagnoses: MDD, Severe, w/o psychotic features;  GAD  Individualized Plan of Care:  1. Patient to engage psychiatric evaluation and follow medication regimen.  2. Patient to engage in individual psychotherapy.  3. Patient to identify and apply coping skills learned in session to decrease symptoms  4. Patient to learn and apply CBT, coping skills and strategies learned in session.  5. Patient to contact this office, go to the local ED or call 911 if a crisis or emergency develops between visits.   Anson [place], [facility]

## 2021-06-02 ENCOUNTER — Emergency Department (HOSPITAL_BASED_OUTPATIENT_CLINIC_OR_DEPARTMENT_OTHER)
Admission: EM | Admit: 2021-06-02 | Discharge: 2021-06-02 | Disposition: A | Discharge status: Home / Self Care | Payer: No Typology Code available for payment source | Attending: Emergency Medicine | Admitting: Emergency Medicine

## 2021-06-02 ENCOUNTER — Encounter (HOSPITAL_BASED_OUTPATIENT_CLINIC_OR_DEPARTMENT_OTHER): Payer: Self-pay

## 2021-06-02 ENCOUNTER — Other Ambulatory Visit: Payer: Self-pay

## 2021-06-02 ENCOUNTER — Inpatient Hospital Stay (EMERGENCY_DEPARTMENT_HOSPITAL)
Admission: AD | Admit: 2021-06-02 | Discharge: 2021-06-02 | Disposition: A | Discharge status: Home / Self Care | Payer: No Typology Code available for payment source | Source: Home / Self Care | Attending: Obstetrics and Gynecology | Admitting: Obstetrics and Gynecology

## 2021-06-02 ENCOUNTER — Emergency Department (HOSPITAL_BASED_OUTPATIENT_CLINIC_OR_DEPARTMENT_OTHER): Payer: No Typology Code available for payment source

## 2021-06-02 DIAGNOSIS — Z30431 Encounter for routine checking of intrauterine contraceptive device: Secondary | ICD-10-CM | POA: Insufficient documentation

## 2021-06-02 DIAGNOSIS — N939 Abnormal uterine and vaginal bleeding, unspecified: Secondary | ICD-10-CM | POA: Diagnosis not present

## 2021-06-02 DIAGNOSIS — N739 Female pelvic inflammatory disease, unspecified: Secondary | ICD-10-CM | POA: Diagnosis not present

## 2021-06-02 DIAGNOSIS — Z789 Other specified health status: Secondary | ICD-10-CM

## 2021-06-02 DIAGNOSIS — N730 Acute parametritis and pelvic cellulitis: Secondary | ICD-10-CM

## 2021-06-02 DIAGNOSIS — N898 Other specified noninflammatory disorders of vagina: Secondary | ICD-10-CM | POA: Diagnosis not present

## 2021-06-02 LAB — CBC WITH DIFFERENTIAL/PLATELET
Abs Immature Granulocytes: 0.04 10*3/uL (ref 0.00–0.07)
Basophils Absolute: 0.1 10*3/uL (ref 0.0–0.1)
Basophils Relative: 1 %
Eosinophils Absolute: 0.1 10*3/uL (ref 0.0–0.5)
Eosinophils Relative: 1 %
HCT: 43.6 % (ref 36.0–46.0)
Hemoglobin: 14.7 g/dL (ref 12.0–15.0)
Immature Granulocytes: 0 %
Lymphocytes Relative: 13 %
Lymphs Abs: 1.5 10*3/uL (ref 0.7–4.0)
MCH: 30.2 pg (ref 26.0–34.0)
MCHC: 33.7 g/dL (ref 30.0–36.0)
MCV: 89.7 fL (ref 80.0–100.0)
Monocytes Absolute: 0.8 10*3/uL (ref 0.1–1.0)
Monocytes Relative: 7 %
Neutro Abs: 9.2 10*3/uL — ABNORMAL HIGH (ref 1.7–7.7)
Neutrophils Relative %: 78 %
Platelets: 219 10*3/uL (ref 150–400)
RBC: 4.86 MIL/uL (ref 3.87–5.11)
RDW: 12.5 % (ref 11.5–15.5)
WBC: 11.6 10*3/uL — ABNORMAL HIGH (ref 4.0–10.5)
nRBC: 0 % (ref 0.0–0.2)

## 2021-06-02 LAB — COMPREHENSIVE METABOLIC PANEL
ALT: 6 U/L (ref 0–44)
AST: 17 U/L (ref 15–41)
Albumin: 4.1 g/dL (ref 3.5–5.0)
Alkaline Phosphatase: 62 U/L (ref 38–126)
Anion gap: 9 (ref 5–15)
BUN: 10 mg/dL (ref 6–20)
CO2: 25 mmol/L (ref 22–32)
Calcium: 9.3 mg/dL (ref 8.9–10.3)
Chloride: 103 mmol/L (ref 98–111)
Creatinine, Ser: 0.79 mg/dL (ref 0.44–1.00)
GFR, Estimated: >60 mL/min (ref >60)
Glucose, Bld: 93 mg/dL (ref 70–99)
Potassium: 4.2 mmol/L (ref 3.5–5.1)
Sodium: 137 mmol/L (ref 135–145)
Total Bilirubin: 0.5 mg/dL (ref 0.3–1.2)
Total Protein: 7.4 g/dL (ref 6.5–8.1)

## 2021-06-02 LAB — WET PREP, GENITAL
Clue Cells Wet Prep HPF POC: NONE SEEN
Sperm: NONE SEEN
Trich, Wet Prep: NONE SEEN
Yeast Wet Prep HPF POC: NONE SEEN

## 2021-06-02 LAB — URINALYSIS, ROUTINE W REFLEX MICROSCOPIC
Bilirubin Urine: NEGATIVE
Glucose, UA: NEGATIVE mg/dL
Ketones, ur: NEGATIVE mg/dL
Nitrite: NEGATIVE
Specific Gravity, Urine: 1.03 (ref 1.005–1.030)
WBC, UA: >50 WBC/hpf — ABNORMAL HIGH (ref 0–5)
pH: 6 (ref 5.0–8.0)

## 2021-06-02 LAB — HCG, QUANTITATIVE, PREGNANCY: hCG, Beta Chain, Quant, S: <1 m[IU]/mL (ref <5)

## 2021-06-02 MED ORDER — IOHEXOL 350 MG/ML SOLN
80.0000 mL | Freq: Once | INTRAVENOUS | Status: AC | PRN
  Administered 2021-06-02: 80 mL via INTRAVENOUS

## 2021-06-02 MED ORDER — CEFTRIAXONE SODIUM 500 MG IJ SOLR
500.0000 mg | Freq: Once | INTRAMUSCULAR | Status: AC
  Administered 2021-06-02: 500 mg via INTRAMUSCULAR
  Filled 2021-06-02: qty 500

## 2021-06-02 MED ORDER — DOXYCYCLINE HYCLATE 100 MG PO TABS
100.0000 mg | ORAL_TABLET | Freq: Once | ORAL | Status: AC
  Administered 2021-06-02: 100 mg via ORAL
  Filled 2021-06-02: qty 1

## 2021-06-02 MED ORDER — DOXYCYCLINE HYCLATE 100 MG PO CAPS
100.0000 mg | ORAL_CAPSULE | Freq: Two times a day (BID) | ORAL | 0 refills | Status: AC
  Filled 2021-06-02: qty 28, 14d supply, fill #0

## 2021-06-02 MED ORDER — LIDOCAINE HCL (PF) 1 % IJ SOLN
INTRAMUSCULAR | Status: AC
  Administered 2021-06-02: 5 mL
  Filled 2021-06-02: qty 5

## 2021-06-02 MED ORDER — KETOROLAC TROMETHAMINE 15 MG/ML IJ SOLN
7.5000 mg | Freq: Once | INTRAMUSCULAR | Status: AC
  Administered 2021-06-02: 7.5 mg via INTRAVENOUS
  Filled 2021-06-02: qty 1

## 2021-06-02 MED ORDER — METRONIDAZOLE 500 MG PO TABS
500.0000 mg | ORAL_TABLET | Freq: Once | ORAL | Status: AC
  Administered 2021-06-02: 500 mg via ORAL
  Filled 2021-06-02: qty 1

## 2021-06-02 MED ORDER — METRONIDAZOLE 500 MG PO TABS
500.0000 mg | ORAL_TABLET | Freq: Two times a day (BID) | ORAL | 0 refills | Status: AC
  Filled 2021-06-02: qty 28, 14d supply, fill #0

## 2021-06-02 NOTE — ED Notes (Signed)
Pt verbalizes understanding of discharge instructions. Opportunity for questioning and answers were provided. Armand removed by staff, pt discharged from ED to home. Educated to pick up Rx and f/u with PCP.

## 2021-06-02 NOTE — MAU Note (Signed)
MSE completed . Pt advised to go to regular Adult ED.

## 2021-06-02 NOTE — ED Triage Notes (Signed)
Patient here for Vaginal Bleeding and Pelvic Pain.  Vaginal Bleeding began approximately 2 weeks PTA and Pain is in the Mid-Pelvic Region and began 3-4 days PTA. Pain is constant and has been worsening over the past few days.   No Urinary Symptoms. Moderate Nausea with Emesis.   IUD placed 1.5 years ago. Ambulatory, [facility].

## 2021-06-02 NOTE — MAU Note (Signed)
..  Julie Webb is a 30 y.o. here in MAU reporting: She had a negative pregnancy test at home and has not had a positive one. Has been having vaginal bleeding for two weeks and abdominal pain that began Wednesday.  Has an IUD in place for 1.5 years. Pain score: 6/10 Vitals:   06/02/21 0107  BP: 117/70  Pulse: (!) 104  Resp: 17  Temp: 98.9 F (37.2 C)  SpO2: 100%

## 2021-06-02 NOTE — MAU Provider Note (Signed)
Event Date/Time   First Provider Initiated Contact with Patient 06/02/21 0153      [name] is a 30 y.o. [phone] patient who presents to MAU today with complaint of vaginal bleeding for 2 weeks and abdominal pain that she states is located in her "uterine wall."  Patient states she has had her IUD in for 1.5 years and expresses concern with displacement.  She reports constant cramping and stabbing pain.    O BP 117/70 (BP Location: Right Arm)   Pulse (!) 104   Temp 98.9 F (37.2 C) (Oral)   Resp 17   Ht '5\' 4"'$  (1.626 m)   Wt 62.5 kg   SpO2 100%   BMI 23.64 kg/m  Physical Exam Constitutional:      General: She is in acute distress.     Appearance: Normal appearance. She is well-developed.  HENT:     Head: Normocephalic and atraumatic.  Eyes:     Conjunctiva/sclera: Conjunctivae normal.  Cardiovascular:     Rate and Rhythm: Tachycardia present.  Pulmonary:     Effort: Pulmonary effort is normal. No respiratory distress.  Musculoskeletal:        General: Normal range of motion.     Cervical back: Normal range of motion.  Neurological:     Mental Status: She is alert and oriented to person, place, and time.  Psychiatric:        Mood and Affect: Mood normal.        Behavior: Behavior normal.        Thought Content: Thought content normal.    A Medical screening exam complete Vaginal Bleeding Non-Pregnant State  P -Discharge from MAU in stable condition -Patient given the option of transfer to [facility] for further evaluation or seek care in outpatient facility of choice. -Patient expresses concern with receiving care outside of [facility] as she feels this is the best place for reproductive related concerns/complaints. -Sentiments given and patient informed that only pregnancy related issues are evaluated in this setting. -Patient states she will go to outside facility for assessment. -Encouraged to follow up with primary gyn next week.    Gavin Pound,  CNM 06/02/2021 1:54 AM

## 2021-06-02 NOTE — ED Notes (Signed)
Patient transported to CT 

## 2021-06-02 NOTE — ED Notes (Addendum)
Assisted MD with pelvic exam.  Pt tolerated well.

## 2021-06-02 NOTE — ED Notes (Signed)
Patient returned from CT

## 2021-06-02 NOTE — ED Provider Notes (Signed)
[facility] EMERGENCY DEPT Provider Note  CSN: [id number] Arrival date & time: 06/02/21 0228  Chief Complaint(s) Vaginal Bleeding and Pelvic Pain  HPI Julie Webb is a 30 y.o. female    Vaginal Bleeding Quality:  Dark red and clots Severity:  Moderate Onset quality:  Gradual Duration:  2 weeks Timing:  Constant Progression:  Waxing and waning Possible pregnancy: yes   Relieved by:  Nothing Worsened by:  Nothing Associated symptoms: abdominal pain and nausea   Associated symptoms: no dysuria   Abdominal pain:    Location:  Suprapubic   Quality: aching and cramping     Severity:  Severe   Onset quality:  Gradual   Duration:  4 days   Timing:  Constant   Progression:  Waxing and waning   Chronicity:  New Risk factors: unprotected sex   Risk factors: no new sexual partner, no STD and no STD exposure   Risk factors comment:  Anal intercourse  Past Medical History Past Medical History:  Diagnosis Date   Anxiety    Depression    Family history of adverse reaction to anesthesia    mother- N/V    Glomerulonephritis, poststreptococcal    age 71   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   Migraines    Vaginal Pap smear, abnormal    Patient Active Problem List   Diagnosis Date Noted   Nonallopathic lesion of cervical region 12/08/2019   Nonallopathic lesion of thoracic region 12/08/2019   Encounter for planned induction of labor 11/23/2019   Nonallopathic lesion of rib cage 10/08/2019   Rib pain on right side 10/07/2019   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 Authorizing Provider  doxycycline (VIBRAMYCIN) 100 MG capsule Take 1 capsule (100 mg total) by mouth 2 (two) times daily for 14 days. 06/02/21 06/16/21 Yes Otha Rickles, Grayce Sessions, MD  metroNIDAZOLE (FLAGYL) 500 MG tablet Take 1 tablet (500 mg total) by mouth 2 (two) times daily for 14 days. 06/02/21 06/16/21 Yes Pachia Strum, Grayce Sessions, MD  Armodafinil (NUVIGIL) 150 MG tablet Take 1 tablet (150 mg total) by mouth daily. 05/10/21   Mozingo, Berdie Ogren, NP  brexpiprazole (REXULTI) 1 MG TABS tablet Take 1 tablet (1 mg total) by mouth daily. 05/10/21   Mozingo, Berdie Ogren, NP  buPROPion (WELLBUTRIN XL) 150 MG 24 hr tablet Take 1 tablet (150 mg total) by mouth daily. 05/10/21   Mozingo, Berdie Ogren, NP  buPROPion (WELLBUTRIN XL) 300 MG 24 hr tablet Take 1 tablet (300 mg total) by mouth daily after breakfast 05/10/21   Mozingo, Berdie Ogren, NP  butalbital-acetaminophen-caffeine (FIORICET, ESGIC) 50-325-40 MG tablet Take 1 tablet by mouth every 6 (six) hours as needed for headache. 12/25/17   Marcial Pacas, MD  chlorhexidine (PERIDEX) 0.12 % solution RINSE MOUTH WITH 15ML (1 CAPFUL) FOR 30 SECONDS IN THE MORNING AND EVENING AFTER TOOTHBRUSHING. EXPECTORATE AFTER RINSING, DO NOT SWALLOW. 10/04/20 10/04/21  Crisp, Hanibal A, DMD  cholecalciferol (VITAMIN D3) 25 MCG (1000 UNIT) tablet Take 1,000 Units by mouth  daily.    [provider]  DULoxetine (CYMBALTA) 60 MG capsule Take 1 capsule (60 mg total) by mouth daily after breakfast. 05/10/21   Mozingo, Berdie Ogren, NP  Prenat w/o A-FeCbn-DSS-FA-DHA (PRENAISSANCE PLUS) 28-1-250 MG CAPS Take 1 capsule by mouth daily. 03/31/19   [provider]  vitamin B-12 (CYANOCOBALAMIN) 1000 MCG tablet Take 1,000 mcg by mouth daily.    [provider]  promethazine (PHENERGAN) 12.5 MG tablet Take 2 tablets (25 mg total) by  mouth every 6 (six) hours as needed for nausea or vomiting. 05/12/19 04/17/20  Manya Silvas, CNM                                                                                                                                    Past Surgical History Past Surgical History:  Procedure Laterality Date   CHOLECYSTECTOMY Right 09/19/2020   NO PAST SURGERIES     Family History Family History  Problem Relation Age of Onset   Hyperlipidemia Mother    Mental illness Mother    Fibroids Mother        uterine   Hypertension Father    Heart attack Father    Heart disease Maternal Grandmother    Hyperlipidemia Maternal Grandmother    Hypertension Maternal Grandmother    Heart disease Maternal Grandfather    Hyperlipidemia Maternal Grandfather    Hypertension Maternal Grandfather    Pancreatic cancer Paternal Grandmother    Mesothelioma Paternal Grandfather     Social History Social History   Tobacco Use   Smoking status: Never   Smokeless tobacco: Never  Vaping Use   Vaping Use: Never used  Substance Use Topics   Alcohol use: Yes    Comment: occas    Drug use: No   Allergies Ibuprofen and Phenergan [promethazine hcl]  Review of Systems Review of Systems  Gastrointestinal:  Positive for abdominal pain and nausea.  Genitourinary:  Negative for dysuria.  All other systems are reviewed and are negative for acute change except as noted in the HPI  Physical Exam Vital Signs  I have reviewed the triage vital signs BP 117/73 (BP Location: Right Arm)   Pulse 100   Temp 99.5 F (37.5 C) (Oral)   Resp 16   Ht '5\' 4"'$  (1.626 m)   Wt 62.1 kg   SpO2 100%   BMI 23.52 kg/m   Physical Exam Vitals reviewed.  Constitutional:      General: She is not in acute distress.    Appearance: She is well-developed. She is not diaphoretic.  HENT:     Head: Normocephalic and atraumatic.     Right Ear: External ear normal.     Left Ear: External ear normal.     Nose: Nose normal.  Eyes:      General: No scleral icterus.    Conjunctiva/sclera: Conjunctivae normal.  Neck:     Trachea: Phonation normal.  Cardiovascular:  Rate and Rhythm: Normal rate and regular rhythm.  Pulmonary:     Effort: Pulmonary effort is normal. No respiratory distress.     Breath sounds: No stridor.  Abdominal:     General: There is no distension.     Tenderness: There is abdominal tenderness in the suprapubic area. There is no guarding or rebound.  Genitourinary:    Pubic Area: No rash.      Labia:        Right: No rash, tenderness or lesion.        Left: No rash, tenderness or lesion.      Vagina: Vaginal discharge (copious purulent) present. No bleeding.     Cervix: Cervical motion tenderness, discharge and friability present.     Uterus: Tender.      Adnexa:        Right: No tenderness.         Left: No tenderness.       Comments: IUD string noted Musculoskeletal:        General: Normal range of motion.     Cervical back: Normal range of motion.  Neurological:     Mental Status: She is alert and oriented to person, place, and time.  Psychiatric:        Behavior: Behavior normal.    ED Results and Treatments Labs (all labs ordered are listed, but only abnormal results are displayed) Labs Reviewed  WET PREP, GENITAL - Abnormal; Notable for the following components:      Result Value   WBC, Wet Prep HPF POC MANY (*)    All other components within normal limits  CBC WITH DIFFERENTIAL/PLATELET - Abnormal; Notable for the following components:   WBC 11.6 (*)    Neutro Abs 9.2 (*)    All other components within normal limits  URINALYSIS, ROUTINE W REFLEX MICROSCOPIC - Abnormal; Notable for the following components:   APPearance HAZY (*)    Hgb urine dipstick MODERATE (*)    Protein, ur TRACE (*)    Leukocytes,Ua LARGE (*)    WBC, UA >50 (*)    All other components within normal limits  COMPREHENSIVE METABOLIC PANEL  HCG, QUANTITATIVE, PREGNANCY  GC/CHLAMYDIA PROBE AMP ([facility]) NOT AT [facility]                                                                                                                         EKG  EKG Interpretation  Date/Time:    Ventricular Rate:    PR Interval:    QRS Duration:   QT Interval:    QTC Calculation:   R Axis:     Text Interpretation:         Radiology CT ABDOMEN PELVIS W CONTRAST  Result Date: 06/02/2021 CLINICAL DATA:  Vaginal bleeding and pelvic pain. EXAM: CT ABDOMEN AND PELVIS WITH CONTRAST TECHNIQUE: Multidetector CT imaging of the abdomen and pelvis was performed using the standard protocol following bolus administration of intravenous contrast. CONTRAST:  24m OMNIPAQUE IOHEXOL 350 MG/ML SOLN COMPARISON:  None. FINDINGS: Lower chest: No acute abnormality. Hepatobiliary: No focal liver abnormality is seen. No gallstones, gallbladder wall thickening, or biliary dilatation. Pancreas: Unremarkable. No pancreatic ductal dilatation or surrounding inflammatory changes. Spleen: Normal in size without focal abnormality. Adrenals/Urinary Tract: Adrenal glands are unremarkable. Kidneys are normal, without renal calculi, focal lesion, or hydronephrosis. Bladder is unremarkable. Stomach/Bowel: Stomach is within normal limits. Appendix appears normal. No evidence of bowel wall thickening, distention, or inflammatory changes. Vascular/Lymphatic: No significant vascular findings are present. No enlarged abdominal or pelvic lymph nodes. Reproductive: An IUD is seen within an otherwise normal appearing uterus. Numerous thin tortuous vessels are seen along the lateral aspects of the uterus. A 2.0 cm diameter cyst is noted within the left adnexa. Other: No abdominal wall hernia or abnormality. No abdominopelvic ascites. Musculoskeletal: No acute or significant osseous findings. IMPRESSION: 1. IUD in place with additional findings that may represent pelvic congestion syndrome. 2. 2.0 cm left adnexal cyst, likely ovarian in origin. Correlation  with pelvic ultrasound is recommended. Electronically Signed   By: TVirgina NorfolkM.D.   On: 06/02/2021 04:22    Pertinent labs & imaging results that were available during my care of the patient were reviewed by me and considered in my medical decision making (see MDM for details).  Medications Ordered in ED Medications  ketorolac (TORADOL) 15 MG/ML injection 7.5 mg (7.5 mg Intravenous Given 06/02/21 0347)  iohexol (OMNIPAQUE) 350 MG/ML injection 80 mL (80 mLs Intravenous Contrast Given 06/02/21 0356)  cefTRIAXone (ROCEPHIN) injection 500 mg (500 mg Intramuscular Given 06/02/21 0510)  doxycycline (VIBRA-TABS) tablet 100 mg (100 mg Oral Given 06/02/21 0510)  metroNIDAZOLE (FLAGYL) tablet 500 mg (500 mg Oral Given 06/02/21 0510)  lidocaine (PF) (XYLOCAINE) 1 % injection (5 mLs  Given 06/02/21 0510)                                                                                                                                     Procedures Procedures  (including critical care time)  Medical Decision Making / ED Course I have reviewed the nursing notes for this encounter and the patient's prior records (if available in EHR or on provided paperwork).  Julie Webb was evaluated in Emergency Department on 06/02/2021 for the symptoms described in the history of present illness. She was evaluated in the context of the global COVID-19 pandemic, which necessitated consideration that the patient might be at risk for infection with the SARS-CoV-2 virus that causes COVID-19. Institutional protocols and algorithms that pertain to the evaluation of patients at risk for COVID-19 are in a state of rapid change based on information released by regulatory bodies including the CDC and federal and state organizations. These policies and algorithms were followed during the patient's care in the ED.     Prolonged vaginal bleeding with pelvic pain. Assess for pregnancy related process, urinary tract infection, PID,  other lower intra-abdominal inflammatory/infectious process.  Pelvic exam was most concerning for PID.  Pertinent labs & imaging results that were available during my care of the patient were reviewed by me and considered in my medical decision making:  CBC with mild leukocytosis.  No anemia. No significant electrolyte derangements or renal sufficiency. beta-hCG negative. UA notable for large Leuks and WBC with protein. UTI vs contamination from vaginal discharge. Wet pre negative for Trich or BV. GC/Chlam sent.  CT obtained and negative intra-abdominal inflammatory/infectious process. It did reveal left adnexal fluid collection favoring cyst. Possible TOA as well. No ultrasound at this time of night here. Discussed with patient. Will treat as outpatient first. Return precautions given.  Will treat for PID and possible TOA. Doxy should cover for UTI as well.   Final Clinical Impression(s) / ED Diagnoses Final diagnoses:  PID (acute pelvic inflammatory disease)   The patient appears reasonably screened and/or stabilized for discharge and I doubt any other medical condition or other [facility] requiring further screening, evaluation, or treatment in the ED at this time prior to discharge. Safe for discharge with strict return precautions.  Disposition: Discharge  Condition: Good  I have discussed the results, Dx and Tx plan with the patient/family who expressed understanding and agree(s) with the plan. Discharge instructions discussed at length. The patient/family was given strict return precautions who verbalized understanding of the instructions. No further questions at time of discharge.    ED Discharge Orders          Ordered    doxycycline (VIBRAMYCIN) 100 MG capsule  2 times daily        06/02/21 0523    metroNIDAZOLE (FLAGYL) 500 MG tablet  2 times daily        06/02/21 0523             Follow Up: Debbrah Alar, NP [place]  [id number] [phone]  Call  as needed  OB/Gyn  Call  to schedule an appointment for close follow up     This chart was dictated using voice recognition software.  Despite best efforts to proofread,  errors can occur which can change the documentation meaning.    Fatima Blank, MD 06/02/21 [phone]

## 2021-06-03 ENCOUNTER — Other Ambulatory Visit (HOSPITAL_COMMUNITY): Payer: Self-pay

## 2021-06-04 LAB — GC/CHLAMYDIA PROBE AMP (~~LOC~~) NOT AT ARMC
Chlamydia: NEGATIVE
Comment: NEGATIVE
Comment: NORMAL
Neisseria Gonorrhea: POSITIVE — AB

## 2021-06-07 ENCOUNTER — Ambulatory Visit: Payer: No Typology Code available for payment source | Admitting: Adult Health

## 2021-06-18 ENCOUNTER — Ambulatory Visit (INDEPENDENT_AMBULATORY_CARE_PROVIDER_SITE_OTHER): Payer: No Typology Code available for payment source | Admitting: Mental Health

## 2021-06-18 ENCOUNTER — Other Ambulatory Visit: Payer: Self-pay

## 2021-06-18 DIAGNOSIS — F330 Major depressive disorder, recurrent, mild: Secondary | ICD-10-CM

## 2021-06-18 NOTE — Progress Notes (Signed)
[facility] Counselor / Therapist Psychotherapy Note  Name: Julie Webb Date: 06/18/2021 MRN: [id number] DOB: 08-22-1991 PCP: Debbrah Alar, NP  Time spent: 54 minutes  Treatment:  Individual therapy   Mental Status Exam:    Appearance:   casual  Behavior:  wnl  Motor:  wnl  Speech/Language:   Clear and Coherent  Affect:  euthymic  Mood:  depressed  Thought process:  normal  Thought content:    WNL  Sensory/Perceptual disturbances:    WNL  Orientation:  x4  Attention:  Good  Concentration:  Good  Memory:  WNL  Fund of knowledge:   Good  Insight:    fair  Judgment:   Fair  Impulse Control:  Fair   Reported Symptoms:  Angry/irritable, confused, depressed, anxious, sleep problems, appetite disturbance, ruminations, feelings of panic  Risk Assessment: Danger to Self:  No Self-injurious Behavior: No Danger to Others: No Duty to Warn:no Physical Aggression / Violence:No  Access to Firearms a concern: No  Gang Involvement:No  Patient / guardian was educated about steps to take if suicide or homicide risk level increases between visits: yes While future psychiatric events cannot be accurately predicted, the patient does not currently require acute inpatient psychiatric care and does not currently meet [facility] involuntary commitment criteria.  Subjective: Patient presents for session in some apparent distress. She shared recent events where she learned from her husband that he was unfaithful again recently. Tearfully, patient detailed learning about the infidelity and it's subsequent impact on her marriage and herself. You said her husband continues to attend counseling. She stated that he had expressed that he didn't think his medications were that helpful, that he was having doubts about their relationship over the past few months and, due to his lack of communication skills, admitted to not expressing this openly with her. Provide support throughout, facilitating  patient identifying and sharing feelings of hurt and confusion due to this also being the second instance of her husband being unfaithful, with the first incident being about two years ago. We encouraged her to focus on her needs and self-care where she stated she has been trying to eat adequately and get rest when possible.    Interventions: Assessment, relational intervening, supportive therapy  Diagnoses: MDD, Severe, w/o psychotic features;  GAD  Individualized Plan of Care:  1. Patient to engage psychiatric evaluation and follow medication regimen.  2. Patient to engage in individual psychotherapy.  3. Patient to identify and apply coping skills learned in session to decrease symptoms  4. Patient to learn and apply CBT, coping skills and strategies learned in session.  5. Patient to contact this office, go to the local ED or call 911 if a crisis or emergency develops between visits.   [place], [facility]

## 2021-06-26 ENCOUNTER — Ambulatory Visit: Payer: No Typology Code available for payment source | Admitting: Adult Health

## 2021-06-28 ENCOUNTER — Ambulatory Visit: Payer: No Typology Code available for payment source | Admitting: Mental Health

## 2021-07-03 ENCOUNTER — Ambulatory Visit: Payer: No Typology Code available for payment source | Admitting: Mental Health

## 2021-07-04 ENCOUNTER — Other Ambulatory Visit (HOSPITAL_COMMUNITY): Payer: Self-pay

## 2021-07-05 ENCOUNTER — Other Ambulatory Visit (HOSPITAL_COMMUNITY): Payer: Self-pay

## 2021-07-05 ENCOUNTER — Ambulatory Visit (INDEPENDENT_AMBULATORY_CARE_PROVIDER_SITE_OTHER): Payer: No Typology Code available for payment source | Admitting: Adult Health

## 2021-07-05 ENCOUNTER — Other Ambulatory Visit: Payer: Self-pay

## 2021-07-05 ENCOUNTER — Encounter: Payer: Self-pay | Admitting: Adult Health

## 2021-07-05 DIAGNOSIS — F330 Major depressive disorder, recurrent, mild: Secondary | ICD-10-CM

## 2021-07-05 DIAGNOSIS — F410 Panic disorder [episodic paroxysmal anxiety] without agoraphobia: Secondary | ICD-10-CM

## 2021-07-05 DIAGNOSIS — F4010 Social phobia, unspecified: Secondary | ICD-10-CM | POA: Diagnosis not present

## 2021-07-05 DIAGNOSIS — G4726 Circadian rhythm sleep disorder, shift work type: Secondary | ICD-10-CM | POA: Diagnosis not present

## 2021-07-05 DIAGNOSIS — F411 Generalized anxiety disorder: Secondary | ICD-10-CM

## 2021-07-05 MED ORDER — ARMODAFINIL 250 MG PO TABS
250.0000 mg | ORAL_TABLET | Freq: Every day | ORAL | 2 refills | Status: DC
  Filled 2021-07-05 – 2021-08-19 (×2): qty 30, 30d supply, fill #0

## 2021-07-05 NOTE — Progress Notes (Signed)
Julie Webb [id number] 1991/09/03 30 y.o.  Subjective:   Patient ID:  Julie Webb is a 30 y.o. (DOB [birth year]) female.  Chief Complaint: No chief complaint on file.   HPI 30 Julie Webb presents to the office today for follow-up of SAD, Panic disorder, GAD, MDD.  Describes mood today as "ok". Pleasant. Tearful. Mood symptoms - reports depression, anxiety and irritability. Stating "I'm having a rough time". Husband having sex with people - recently gave her gonorrhea. Husband diagnosed with Bipolar 1 - impulsive - in treatment. Stating "I'm trying to work through things". She and husband involved in couples therapy. Planning a trip to [place]. 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 Getting out some - "fearful". Varying interest and motivation. Taking medications as prescribed.  Energy levels stable. Active, does not have a regular exercise routine.  Enjoys some usual interests and activities. Married. Lives with husband and 30 month old son. Spending time with family. Appetite adequate. Weight gain - 140 pounds. Sleeps well most nights. Averages 8 to 9 hours. Not feeling as fatigued.  Focus and concentration stable. Completing tasks. Managing aspects of household. 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  Denies SI or HI.  Denies AH or VH.   [place] Office Visit from 11/12/2017 in [facility] [place] [facility] from 06/02/2021 in [facility]  C-SSRS RISK CATEGORY No Risk        Review of Systems:  Review of Systems  Musculoskeletal:  Negative for gait problem.  Neurological:  Negative for tremors.  Psychiatric/Behavioral:         Please refer to HPI   Medications: I have reviewed the patient's current medications.  Current Outpatient Medications  Medication Sig Dispense Refill   Armodafinil 250 MG tablet Take 1 tablet (250 mg total)  by mouth daily. 30 tablet 2   brexpiprazole (REXULTI) 1 MG TABS tablet Take 1 tablet (1 mg total) by mouth daily. 90 tablet 1   buPROPion (WELLBUTRIN XL) 150 MG 24 hr tablet Take 1 tablet (150 mg total) by mouth daily. 90 tablet 1   buPROPion (WELLBUTRIN XL) 300 MG 24 hr tablet Take 1 tablet (300 mg total) by mouth daily after breakfast 90 tablet 1   butalbital-acetaminophen-caffeine (FIORICET, ESGIC) 50-325-40 MG tablet Take 1 tablet by mouth every 6 (six) hours as needed for headache. 12 tablet 5   chlorhexidine (PERIDEX) 0.12 % solution RINSE MOUTH WITH 15ML (1 CAPFUL) FOR 30 SECONDS IN THE MORNING AND EVENING AFTER TOOTHBRUSHING. EXPECTORATE AFTER RINSING, DO NOT SWALLOW. 473 mL 0   cholecalciferol (VITAMIN D3) 25 MCG (1000 UNIT) tablet Take 1,000 Units by mouth daily.     DULoxetine (CYMBALTA) 60 MG capsule Take 1 capsule (60 mg total) by mouth daily after breakfast. 90 capsule 1   Prenat w/o A-FeCbn-DSS-FA-DHA (PRENAISSANCE PLUS) 28-1-250 MG CAPS Take 1 capsule by mouth daily.     vitamin B-12 (CYANOCOBALAMIN) 1000 MCG tablet Take 1,000 mcg by mouth daily.     No current facility-administered medications for this visit.    Medication Side Effects: None  Allergies:  Allergies  Allergen Reactions   Ibuprofen Other (See Comments)    Kidney failure   Phenergan [Promethazine Hcl] Other (See Comments)    Restlessness w/ IV Phenergen. Probable mild EPS. Tolerates if taken w/ Benadryl.  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   Please see review of systems for further details on the patient's review from today.   Objective:    Physical Exam:  There were no vitals taken for this visit.  Physical Exam Constitutional:      General: She is not in acute distress. Musculoskeletal:        General: No deformity.  Neurological:     Mental Status: She is alert and oriented to person, place, and time.     Coordination: Coordination normal.  Psychiatric:        Attention and Perception: Attention and perception normal. She does not perceive auditory or visual hallucinations.        Mood and Affect: Mood normal. Mood is not anxious or depressed. Affect is not labile, blunt, angry or inappropriate.        Speech: Speech normal.        Behavior: Behavior normal.        Thought Content: Thought content normal. Thought content is not paranoid or delusional. Thought content does not include homicidal or suicidal ideation. Thought content does not include homicidal or suicidal plan.        Cognition and Memory: Cognition and memory normal.        Judgment: Judgment normal.     Comments: Insight intact    Lab Review:     Component Value Date/Time   NA 137 06/02/2021 0258   K 4.2 06/02/2021 0258   CL 103 06/02/2021 0258   CO2 25 06/02/2021 0258   GLUCOSE 93 06/02/2021 0258   BUN 10 06/02/2021 0258   CREATININE 0.79 06/02/2021 0258   CALCIUM 9.3 06/02/2021 0258   PROT 7.4 06/02/2021 0258   ALBUMIN 4.1 06/02/2021 0258   AST 17 06/02/2021 0258   ALT 6 06/02/2021 0258   ALKPHOS 62 06/02/2021 0258   BILITOT 0.5 06/02/2021 0258   GFRNONAA >60 06/02/2021 0258   GFRAA >60 06/05/2019 1928       Component Value Date/Time   WBC 11.6 (H) 06/02/2021 0258   RBC 4.86 06/02/2021 0258   HGB 14.7 06/02/2021 0258   HCT 43.6 06/02/2021 0258   PLT 219 06/02/2021 0258   MCV 89.7 06/02/2021 0258   MCH 30.2 06/02/2021 0258   MCHC 33.7 06/02/2021 0258   RDW 12.5 06/02/2021 0258   LYMPHSABS 1.5 06/02/2021 0258   MONOABS 0.8 06/02/2021 0258   EOSABS 0.1 06/02/2021 0258   BASOSABS 0.1 06/02/2021 0258    No results found  for: POCLITH, LITHIUM   No results found for: PHENYTOIN, PHENOBARB, VALPROATE, CBMZ   .res Assessment: Plan:     Plan:  PDMP reviewed  1. Continue Cymbalta '60mg'$  daily 2. Continue Wellbutrin XL '450mg'$  daily - denies seizure history - move to morning 3. Continue Rexulti 0.'5mg'$  daily  4. Increase Provigil  '150mg'$  to '250mg'$  daily for shift work disorder  112/80  RTC 3 months  Patient advised to contact office with any questions, adverse effects, or acute worsening in signs and symptoms.  Discussed potential metabolic side effects associated with atypical antipsychotics,  as well as potential risk for movement side effects. Advised pt to contact office if movement side effects occur.   Diagnoses and all orders for this visit:  Social anxiety disorder  Shift work sleep disorder  Mild recurrent major depression ([place])  Panic disorder  Generalized anxiety disorder  Other orders -     Armodafinil 250 MG tablet; Take 1 tablet (250 mg total) by mouth daily.    Please see After Visit Summary for patient specific instructions.  Future Appointments  Date Time Provider Gary  07/12/2021 12:00 PM Anson [place], [facility] CP-CP None  07/17/2021  2:00 PM Anson [place], [facility] CP-CP None  07/19/2021 10:00 AM Anson [place], [facility] CP-CP None  07/24/2021  1:00 PM Anson [place], [facility] CP-CP None  07/31/2021  2:00 PM Anson [place], [facility] CP-CP None  08/06/2021  2:00 PM Anson [place], [facility] CP-CP None  08/13/2021  2:00 PM Anson [place], [facility] CP-CP None    No orders of the defined types were placed in this encounter.   -------------------------------

## 2021-07-12 ENCOUNTER — Ambulatory Visit: Payer: No Typology Code available for payment source | Admitting: Mental Health

## 2021-07-17 ENCOUNTER — Ambulatory Visit: Payer: No Typology Code available for payment source | Admitting: Mental Health

## 2021-07-18 ENCOUNTER — Encounter (HOSPITAL_COMMUNITY): Payer: Self-pay | Admitting: Radiology

## 2021-07-19 ENCOUNTER — Ambulatory Visit: Payer: No Typology Code available for payment source | Admitting: Mental Health

## 2021-07-23 ENCOUNTER — Ambulatory Visit (INDEPENDENT_AMBULATORY_CARE_PROVIDER_SITE_OTHER): Payer: No Typology Code available for payment source | Admitting: Family

## 2021-07-23 ENCOUNTER — Telehealth: Payer: Self-pay | Admitting: Family

## 2021-07-23 ENCOUNTER — Other Ambulatory Visit (HOSPITAL_COMMUNITY): Payer: Self-pay

## 2021-07-23 ENCOUNTER — Encounter: Payer: Self-pay | Admitting: Family

## 2021-07-23 ENCOUNTER — Other Ambulatory Visit: Payer: Self-pay

## 2021-07-23 VITALS — BP 126/79 | HR 85 | Resp 18 | Ht 64.0 in | Wt 146.0 lb

## 2021-07-23 DIAGNOSIS — R053 Chronic cough: Secondary | ICD-10-CM | POA: Diagnosis not present

## 2021-07-23 DIAGNOSIS — Z0000 Encounter for general adult medical examination without abnormal findings: Secondary | ICD-10-CM

## 2021-07-23 DIAGNOSIS — Z79899 Other long term (current) drug therapy: Secondary | ICD-10-CM

## 2021-07-23 DIAGNOSIS — Z1159 Encounter for screening for other viral diseases: Secondary | ICD-10-CM

## 2021-07-23 DIAGNOSIS — G43809 Other migraine, not intractable, without status migrainosus: Secondary | ICD-10-CM

## 2021-07-23 MED ORDER — OMEPRAZOLE 40 MG PO CPDR
40.0000 mg | DELAYED_RELEASE_CAPSULE | Freq: Every day | ORAL | 0 refills | Status: DC
  Filled 2021-07-23: qty 30, 30d supply, fill #0

## 2021-07-23 NOTE — Assessment & Plan Note (Signed)
Reports neurology signed off. Requesting refill of fioricet which was previously rx'd by neuro. Controlled substance contract is signed. Pt will provide UDS. Reviewed [place] Controlled Substance Registry.

## 2021-07-23 NOTE — Progress Notes (Signed)
Subjective:   By signing my name below, I, Lyric Barr-McArthur, attest that this documentation has been prepared under the direction and in the presence of Debbrah Alar, NP, 07/23/2021   Patient ID: Julie Webb, female    DOB: Apr 30, 1991, 30 y.o.   MRN: [id number]  Chief Complaint  Patient presents with   Annual Exam    HPI Patient is in today for an office visit.  Gonorrhea: She was diagnosed with gonorrhea/PID a few months ago and went through the treatment. She was retested last month and the results were negative.  Child: She gave birth to her child in 2021.  Cough: She complains of a coughing spell that comes on about 3 times a day.   She denies any fever, unexpected weight change, adenopathy, rash, hearing loss, ear pain, rhinorrhea, visual disturbances, eye pain, chest pain, leg swelling, nausea, vomitting, diarrhea, blood in stool, dysuria, frequency, myalgias, arthralgias,  depression or anxiety.  Immunizations: She has received her flu shot at work a few weeks ago. She has had 3 Covid-19 vaccines. She is interested in getting the updated Covid-19 booster vaccination at a different pharmacy.  Behavior health: She has been working with behavior health to manage her anxiety symptoms.  Exercise: She participates in moderate walking every now and then but does not put a focus on exercise.  Pap Smear: Last performed on 12/04/2017 and results were normal. Repeat every 3 years. Due.  Mammogram: No mammograms documented. Due.  Shx: She had her wisdom teeth removed in December of 2021 and her gull bladder removed also in December of 2021.  FMHx: No changes to her family medical history.  Dental: She is UTD on dental. Vision: She is UTD on vision.  Alcohol: She socially drinks.  Drug: She does not use drugs.  Tobacco: She does not use any tobacco products.  Birth control: She uses the mirena IUD for birth control. She experiences mild spotting while having her IUD.  Family:  She lives in the home with her husband and son. There are no pets in the home.    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   Migraines    Vaginal Pap smear, abnormal     Past Surgical History:  Procedure Laterality Date   CHOLECYSTECTOMY Right 09/19/2020   NO PAST SURGERIES      Family History  Problem Relation Age of Onset   Hyperlipidemia Mother    Mental illness Mother    Fibroids Mother        uterine   Hypertension Father    Heart attack Father    Heart disease Maternal Grandmother    Hyperlipidemia Maternal Grandmother    Hypertension Maternal Grandmother    Heart disease Maternal Grandfather    Hyperlipidemia Maternal Grandfather    Hypertension Maternal Grandfather    Pancreatic cancer Paternal Grandmother    Mesothelioma Paternal Grandfather     Social History   Socioeconomic History   Marital status: Married    Spouse name: Not on file   Number of children: Not on file  Years of education: Not on file   Highest education level: Not on file  Occupational History   Not on file  Tobacco Use   Smoking status: Never   Smokeless tobacco: Never  Vaping Use   Vaping Use: Never used  Substance and Sexual Activity   Alcohol use: Yes    Comment: occasional   Drug use: No   Sexual activity: Yes    Birth control/protection: I.U.D.  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 30 tablet 2   brexpiprazole (REXULTI) 1 MG TABS tablet Take 1 tablet (1 mg total) by mouth daily. 90 tablet 1   buPROPion (WELLBUTRIN XL) 150 MG 24 hr tablet Take 1 tablet (150 mg total) by mouth daily. 90 tablet 1   buPROPion (WELLBUTRIN XL) 300 MG 24 hr tablet Take 1 tablet (300 mg total) by mouth daily after breakfast 90 tablet 1   butalbital-acetaminophen-caffeine (FIORICET, ESGIC) 50-325-40 MG tablet Take 1 tablet by mouth every 6 (six) hours as needed for headache. 12 tablet 5   DULoxetine (CYMBALTA) 60 MG capsule Take 1 capsule (60 mg total) by mouth daily after breakfast. 90 capsule 1   Prenat w/o A-FeCbn-DSS-FA-DHA (PRENAISSANCE PLUS) 28-1-250 MG CAPS Take 1 capsule by mouth daily.     chlorhexidine (PERIDEX) 0.12 % solution RINSE MOUTH WITH 15ML (1 CAPFUL) FOR 30 SECONDS IN THE MORNING AND EVENING AFTER TOOTHBRUSHING. EXPECTORATE AFTER RINSING, DO NOT SWALLOW. 473 mL 0   cholecalciferol (VITAMIN D3) 25 MCG (1000 UNIT) tablet Take 1,000 Units by mouth daily.     vitamin B-12 (CYANOCOBALAMIN) 1000 MCG tablet Take 1,000 mcg by mouth daily.     No facility-administered medications prior to visit.    Allergies  Allergen Reactions   Ibuprofen Other (See Comments)    Kidney failure   Phenergan [Promethazine Hcl] Other (See Comments)    Restlessness w/ IV Phenergen. Probable mild EPS. Tolerates if taken w/ Benadryl.     Review of Systems  Constitutional:  Negative for fever.       (-) unexpected weight changes (-) adenopathy  HENT:  Negative for ear pain and hearing loss.        (-) rhinorrhea  Eyes:  Negative for  pain.       (-) visual disturbances  Respiratory:  Positive for cough (Mild episodes about 3 times per day).   Cardiovascular:  Negative for chest pain and leg swelling.  Gastrointestinal:  Negative for blood in stool, diarrhea, nausea and vomiting.  Genitourinary:  Negative for dysuria and frequency.  Musculoskeletal:  Negative for joint pain and myalgias.  Skin:  Negative for rash.  Neurological:  Positive for headaches (frequent mild headaches).  Psychiatric/Behavioral:  Negative for depression. The patient is not nervous/anxious.  Objective:    Physical Exam Constitutional:      General: She is not in acute distress.    Appearance: Normal appearance. She is not ill-appearing.  HENT:     Head: Normocephalic and atraumatic.     Right Ear: Tympanic membrane, ear canal and external ear normal.     Left Ear: Tympanic membrane, ear canal and external ear normal.  Eyes:     Extraocular Movements: Extraocular movements intact.     Pupils: Pupils are equal, round, and reactive to light.     Comments: (-) nystagmus  Cardiovascular:     Rate and Rhythm: Normal rate and regular rhythm.     Heart sounds: Normal heart sounds. No murmur heard.   No gallop.  Pulmonary:     Effort: Pulmonary effort is normal. No respiratory distress.     Breath sounds: Normal breath sounds. No wheezing or rales.  Abdominal:     General: Bowel sounds are normal.     Palpations: Abdomen is soft.     Tenderness: There is no abdominal tenderness.  Musculoskeletal:     Comments: (+) 5/5 upper and lower extremity strength  Lymphadenopathy:     Cervical: No cervical adenopathy.  Skin:    General: Skin is warm and dry.  Neurological:     Mental Status: She is alert and oriented to person, place, and time.  Psychiatric:        Behavior: Behavior normal.        Judgment: Judgment normal.    BP 126/79   Pulse 85   Resp 18   Ht 5\' 4"  (1.626 m)   Wt 146 lb (66.2 kg)   SpO2 94%   BMI 25.06 kg/m   Wt Readings from Last 3 Encounters:  07/23/21 146 lb (66.2 kg)  06/02/21 137 lb (62.1 kg)  06/02/21 137 lb 11.2 oz (62.5 kg)       Assessment & Plan:   Problem List Items Addressed This Visit       Unprioritized   Preventative health care - Primary    Encouraged healthy diet, exercise.  Will request copy of most recent pap smear.        Relevant Orders   B12   Vitamin D (25 hydroxy)   Migraine    Reports neurology signed off. Requesting refill of fioricet which was previously rx'd by neuro. Controlled substance contract is signed. Pt will provide UDS. Reviewed [place] Controlled Substance Registry.       Chronic cough    ? Reflux related. Rx sent for omeprazole 40mg . She will send me a mychart message in a few weeks to let me know how she is doing.      Other Visit Diagnoses     Need for hepatitis C screening test       Relevant Orders   Hepatitis C Antibody   High risk medication use       Relevant Orders   DRUG MONITORING, PANEL 8 WITH CONFIRMATION, URINE      Meds ordered this encounter  Medications   omeprazole (PRILOSEC) 40 MG capsule    Sig: Take 1 capsule (40 mg total) by mouth daily.    Dispense:  30 capsule    Refill:  0    Order Specific Question:   Supervising Provider    Answer:   Penni Homans A [[id number]]    I, Debbrah Alar, NP, personally preformed the services described in this documentation.  All medical record entries made by  the scribe were at my direction and in my presence.  I have reviewed the chart and discharge instructions (if applicable) and agree that the record reflects my personal performance and is accurate and complete. 07/23/2021  I,Lyric Barr-McArthur,acting as a Education administrator for Nance Pear, NP.,have documented all relevant documentation on the behalf of Nance Pear, NP,as directed by  Nance Pear, NP while in the presence of Nance Pear, NP.  Nance Pear, NP

## 2021-07-23 NOTE — Assessment & Plan Note (Signed)
?   Reflux related. Rx sent for omeprazole 40mg . She will send me a mychart message in a few weeks to let me know how she is doing.

## 2021-07-23 NOTE — Assessment & Plan Note (Signed)
Encouraged healthy diet, exercise.  Will request copy of most recent pap smear.

## 2021-07-23 NOTE — Telephone Encounter (Signed)
Please call [facility] and request a copy of most recent pap.

## 2021-07-23 NOTE — Patient Instructions (Signed)
Please complete lab work prior to leaving.   

## 2021-07-23 NOTE — Assessment & Plan Note (Signed)
>>  ASSESSMENT AND PLAN FOR MIGRAINE WRITTEN ON 07/23/2021  2:50 PM BY O'SULLIVAN, Chase Arnall, NP  Reports neurology signed off. Requesting refill of fioricet  which was previously rx'd by neuro. Controlled substance contract is signed. Pt will provide UDS. Reviewed [place] Controlled Substance Registry.

## 2021-07-24 ENCOUNTER — Other Ambulatory Visit (HOSPITAL_COMMUNITY): Payer: Self-pay

## 2021-07-24 ENCOUNTER — Ambulatory Visit: Payer: No Typology Code available for payment source | Admitting: Mental Health

## 2021-07-24 LAB — HEPATITIS C ANTIBODY
Hepatitis C Ab: NONREACTIVE
SIGNAL TO CUT-OFF: 0.01 (ref <1.00)

## 2021-07-24 LAB — DRUG MONITORING, PANEL 8 WITH CONFIRMATION, URINE
6 Acetylmorphine: NEGATIVE ng/mL (ref <10)
Alcohol Metabolites: NEGATIVE ng/mL (ref <500)
Amphetamines: NEGATIVE ng/mL (ref <500)
Benzodiazepines: NEGATIVE ng/mL (ref <100)
Buprenorphine, Urine: NEGATIVE ng/mL (ref <5)
Cocaine Metabolite: NEGATIVE ng/mL (ref <150)
Creatinine: 66.6 mg/dL (ref >=20.0)
MDMA: NEGATIVE ng/mL (ref <500)
Marijuana Metabolite: NEGATIVE ng/mL (ref <20)
Opiates: NEGATIVE ng/mL (ref <100)
Oxidant: NEGATIVE ug/mL (ref <200)
Oxycodone: NEGATIVE ng/mL (ref <100)
pH: 7.3 (ref 4.5–9.0)

## 2021-07-24 LAB — VITAMIN B12: Vitamin B-12: 399 pg/mL (ref 211–911)

## 2021-07-24 LAB — VITAMIN D 25 HYDROXY (VIT D DEFICIENCY, FRACTURES): VITD: 26.79 ng/mL — ABNORMAL LOW (ref 30.00–100.00)

## 2021-07-24 LAB — DM TEMPLATE

## 2021-07-24 NOTE — Telephone Encounter (Signed)
Request sent 

## 2021-07-25 ENCOUNTER — Telehealth: Payer: Self-pay | Admitting: Family

## 2021-07-25 ENCOUNTER — Other Ambulatory Visit (HOSPITAL_COMMUNITY): Payer: Self-pay

## 2021-07-25 MED ORDER — VITAMIN D (ERGOCALCIFEROL) 1.25 MG (50000 UNIT) PO CAPS
50000.0000 [IU] | ORAL_CAPSULE | ORAL | 0 refills | Status: DC
  Filled 2021-07-25 – 2021-08-05 (×2): qty 12, 84d supply, fill #0

## 2021-07-25 MED ORDER — VITAMIN B-12 1000 MCG PO TABS: 1000.0000 ug | ORAL_TABLET | Freq: Every day | ORAL | Status: DC

## 2021-07-25 NOTE — Telephone Encounter (Signed)
Vitamin D level is low.  Advise patient to begin vit D 50000 units once weekly for 12 weeks, then repeat vit D level (dx Vit D deficiency).    B12 is low normal. Recommend otc b12 10108mcg once daily. Repeat b12 in 12 weeks, b12 deficiency.  Hep C is negative.

## 2021-07-26 NOTE — Telephone Encounter (Signed)
Called but n/a lvm for patient to call back

## 2021-07-29 NOTE — Telephone Encounter (Signed)
Lvm for patient to return call.

## 2021-07-29 NOTE — Telephone Encounter (Signed)
Who Is Calling Patient / Member / Family / Caregiver Caller Name Mind[place] Phone Number [phone] Patient Name Julie Webb Patient DOB [birth year] Call Type Message Only Information Provided Reason for Call Request for General Office Information Initial Comment Caller is returning a missed call from Rod Holler about her lab results. Disp. Time Disposition Final User 07/26/2021 5:51:56 PM General Information Provided Yes Gerhard Perches

## 2021-07-30 ENCOUNTER — Other Ambulatory Visit: Payer: Self-pay

## 2021-07-30 DIAGNOSIS — E538 Deficiency of other specified B group vitamins: Secondary | ICD-10-CM

## 2021-07-30 DIAGNOSIS — E559 Vitamin D deficiency, unspecified: Secondary | ICD-10-CM

## 2021-07-30 NOTE — Telephone Encounter (Signed)
Patient advised of results, new medications and provider's advise. She was scheduled to come back for labs in 12 weeks. Orders placed as future.

## 2021-07-31 ENCOUNTER — Ambulatory Visit: Payer: No Typology Code available for payment source | Admitting: Mental Health

## 2021-08-02 ENCOUNTER — Other Ambulatory Visit (HOSPITAL_COMMUNITY): Payer: Self-pay

## 2021-08-04 ENCOUNTER — Encounter: Payer: Self-pay | Admitting: Family

## 2021-08-05 ENCOUNTER — Other Ambulatory Visit (HOSPITAL_COMMUNITY): Payer: Self-pay

## 2021-08-05 NOTE — Telephone Encounter (Signed)
Pt scheduled appointment 10.21.22

## 2021-08-06 ENCOUNTER — Ambulatory Visit: Payer: No Typology Code available for payment source | Admitting: Mental Health

## 2021-08-09 ENCOUNTER — Ambulatory Visit (INDEPENDENT_AMBULATORY_CARE_PROVIDER_SITE_OTHER): Payer: No Typology Code available for payment source | Admitting: Family

## 2021-08-09 ENCOUNTER — Other Ambulatory Visit: Payer: Self-pay

## 2021-08-09 VITALS — BP 111/67 | HR 91 | Temp 98.6°F | Resp 16 | Wt 146.0 lb

## 2021-08-09 DIAGNOSIS — D225 Melanocytic nevi of trunk: Secondary | ICD-10-CM

## 2021-08-09 DIAGNOSIS — D229 Melanocytic nevi, unspecified: Secondary | ICD-10-CM

## 2021-08-09 NOTE — Addendum Note (Signed)
Addended by: Jiles Prows on: 08/09/2021 02:08 PM   Modules accepted: Orders

## 2021-08-09 NOTE — Progress Notes (Addendum)
Subjective:   By signing my name below, I, Zite Okoli, attest that this documentation has been prepared under the direction and in the presence of Debbrah Alar, NP 08/09/2021    Patient ID: Julie Webb, female    DOB: 06-Dec-1990, 30 y.o.   MRN: [id number]  Chief Complaint  Patient presents with   Nevus    Patient is here for mole that is "changing and painful"    HPI Patient is in today for an office visit.  Mole- She has a mole on her right groin which she has had her entire life. She notes that the mole began to change a few months ago. It is very bothersome to her as it is near her panty line and she does not like the way that it looks.  She is worried about and would like to have it excised.  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   Migraines    Vaginal Pap smear, abnormal     Past Surgical History:  Procedure Laterality Date   CHOLECYSTECTOMY Right 09/19/2020   NO PAST SURGERIES      Family History  Problem Relation Age of Onset   Hyperlipidemia Mother    Mental illness Mother    Fibroids Mother        uterine   Hypertension Father    Heart attack Father    Heart disease Maternal Grandmother    Hyperlipidemia Maternal Grandmother    Hypertension Maternal Grandmother    Heart disease Maternal Grandfather    Hyperlipidemia Maternal Grandfather    Hypertension Maternal Grandfather    Pancreatic cancer Paternal Grandmother    Mesothelioma Paternal Grandfather     Social History   Socioeconomic History   Marital status: Married    Spouse name: Not on file   Number of children: Not on file   Years of education: Not on file   Highest education level: Not on file  Occupational History   Not on file  Tobacco Use   Smoking status: Never   Smokeless tobacco: Never   Vaping Use   Vaping Use: Never used  Substance and Sexual Activity   Alcohol use: Yes    Comment: occasional   Drug use: No   Sexual activity: Yes    Birth control/protection: I.U.D.  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 30 tablet 2   brexpiprazole (REXULTI) 1 MG TABS tablet Take 1 tablet (1 mg total) by mouth daily. 90 tablet 1   buPROPion (WELLBUTRIN XL) 150 MG 24 hr tablet  Take 1 tablet (150 mg total) by mouth daily. 90 tablet 1   buPROPion (WELLBUTRIN XL) 300 MG 24 hr tablet Take 1 tablet (300 mg total) by mouth daily after breakfast 90 tablet 1   butalbital-acetaminophen-caffeine (FIORICET, ESGIC) 50-325-40 MG tablet Take 1 tablet by mouth every 6 (six) hours as needed for headache. 12 tablet 5   DULoxetine (CYMBALTA) 60 MG capsule Take 1 capsule (60 mg total) by mouth daily after breakfast. 90 capsule 1   omeprazole (PRILOSEC) 40 MG capsule Take 1 capsule (40 mg total) by mouth daily. 30 capsule 0   Prenat w/o A-FeCbn-DSS-FA-DHA (PRENAISSANCE PLUS) 28-1-250 MG CAPS Take 1 capsule by mouth daily.     vitamin B-12 (CYANOCOBALAMIN) 1000 MCG tablet Take 1 tablet (1,000 mcg total) by mouth daily.     Vitamin D, Ergocalciferol, (DRISDOL) 1.25 MG (50000 UNIT) CAPS capsule Take 1 capsule (50,000 Units total) by mouth every 7 (seven) days. 12 capsule 0   No facility-administered medications prior to  visit.    Allergies  Allergen Reactions   Ibuprofen Other (See Comments)    Kidney failure   Phenergan [Promethazine Hcl] Other (See Comments)    Restlessness w/ IV Phenergen. Probable mild EPS. Tolerates if taken w/ Benadryl.     Review of Systems  Constitutional:  Negative for fever.  HENT:  Negative for ear pain and hearing loss.        (-)nystagmus (-)adenopathy  Eyes:  Negative for blurred vision.  Respiratory:  Negative for cough, shortness of breath and wheezing.   Cardiovascular:  Negative for chest pain and leg swelling.  Gastrointestinal:  Negative for blood in stool, diarrhea, nausea and vomiting.  Genitourinary:  Negative for dysuria and frequency.  Musculoskeletal:  Negative for joint pain and myalgias.  Skin:  Negative for rash.  Neurological:  Negative for headaches.  Psychiatric/Behavioral:  Negative for depression. The patient is not nervous/anxious.       Objective:    Physical Exam Constitutional:      General: She is not in acute distress.    Appearance: Normal appearance. She is not ill-appearing.  HENT:     Head: Normocephalic and atraumatic.     Right Ear: External ear normal.     Left Ear: External ear normal.  Eyes:     Extraocular Movements: Extraocular movements intact.     Pupils: Pupils are equal, round, and reactive to light.  Cardiovascular:     Rate and Rhythm: Normal rate.  Pulmonary:     Effort: Pulmonary effort is normal.  Musculoskeletal:     Cervical back: Neck supple.  Lymphadenopathy:     Cervical: No cervical adenopathy.  Skin:    General: Skin is warm and dry.     Comments: Pedunculated nevus right upper groin.  <1cm in size  Neurological:     Mental Status: She is alert and oriented to person, place, and time.  Psychiatric:        Behavior: Behavior normal.        Judgment: Judgment normal.    BP 111/67 (BP Location: Right Arm, Patient Position: Sitting, Cuff Size: Small)   Pulse 91   Temp 98.6 F (37 C) (Oral)    Resp 16   Wt 146 lb (66.2 kg)   SpO2 99%   BMI 25.06 kg/m  Wt Readings from Last 3 Encounters:  08/09/21 146 lb (66.2 kg)  07/23/21 146 lb (66.2 kg)  06/02/21 137 lb (62.1 kg)    Diabetic Foot Exam -  Simple   No data filed    Lab Results  Component Value Date   WBC 11.6 (H) 06/02/2021   HGB 14.7 06/02/2021   HCT 43.6 06/02/2021   PLT 219 06/02/2021   GLUCOSE 93 06/02/2021   CHOL 140 12/04/2017   TRIG 36.0 12/04/2017   HDL 58.00 12/04/2017   LDLCALC 75 12/04/2017   ALT 6 06/02/2021   AST 17 06/02/2021   NA 137 06/02/2021   K 4.2 06/02/2021   CL 103 06/02/2021   CREATININE 0.79 06/02/2021   BUN 10 06/02/2021   CO2 25 06/02/2021   TSH 1.40 12/04/2017    Lab Results  Component Value Date   TSH 1.40 12/04/2017   Lab Results  Component Value Date   WBC 11.6 (H) 06/02/2021   HGB 14.7 06/02/2021   HCT 43.6 06/02/2021   MCV 89.7 06/02/2021   PLT 219 06/02/2021   Lab Results  Component Value Date   NA 137 06/02/2021   K 4.2 06/02/2021   CO2 25 06/02/2021   GLUCOSE 93 06/02/2021   BUN 10 06/02/2021   CREATININE 0.79 06/02/2021   BILITOT 0.5 06/02/2021   ALKPHOS 62 06/02/2021   AST 17 06/02/2021   ALT 6 06/02/2021   PROT 7.4 06/02/2021   ALBUMIN 4.1 06/02/2021   CALCIUM 9.3 06/02/2021   ANIONGAP 9 06/02/2021   GFR 90.56 12/04/2017   Lab Results  Component Value Date   CHOL 140 12/04/2017   Lab Results  Component Value Date   HDL 58.00 12/04/2017   Lab Results  Component Value Date   LDLCALC 75 12/04/2017   Lab Results  Component Value Date   TRIG 36.0 12/04/2017   Lab Results  Component Value Date   CHOLHDL 2 12/04/2017   No results found for: HGBA1C     Assessment & Plan:   Problem List Items Addressed This Visit       Unprioritized   Nevus - Primary    Diagnosis: nevus - Location: Right Groin Procedure: Shave Biopsy Informed consent:  Discussed risks including scaring, infection and benefits of the procedure, as well as the  alternatives.  Informed consent was obtained. Anesthesia: Local (lidocaine with 1% epi) The area was prepared and draped in a standard fashion. Nevus was removed using a dermablade The patient tolerated the procedure well. The patient was instructed on post-op care.       Relevant Orders   Surgical pathology( [facility]/ [facility])    No orders of the defined types were placed in this encounter.   I,Zite Okoli,acting as a Education administrator for [employer], NP.,have documented all relevant documentation on the behalf of Nance Pear, NP,as directed by  Nance Pear, NP while in the presence of Nance Pear, NP.   I, Debbrah Alar, NP, personally preformed the services described in this documentation.  All medical record entries made by the scribe were at my direction and in my presence.  I have reviewed the chart and discharge instructions (if applicable) and agree that the record reflects my personal performance and is accurate and complete. 10//21/2022

## 2021-08-09 NOTE — Assessment & Plan Note (Addendum)
Diagnosis: nevus - Location: Right Groin Procedure: Shave Biopsy Informed consent:  Discussed risks including scaring, infection and benefits of the procedure, as well as the alternatives.  Informed consent was obtained. Anesthesia: Local (lidocaine with 1% epi) The area was prepared and draped in a standard fashion. Nevus was removed using a dermablade The patient tolerated the procedure well. The patient was instructed on post-op care.

## 2021-08-12 ENCOUNTER — Other Ambulatory Visit: Payer: Self-pay | Admitting: Family

## 2021-08-13 ENCOUNTER — Ambulatory Visit: Payer: No Typology Code available for payment source | Admitting: Mental Health

## 2021-08-15 ENCOUNTER — Encounter: Payer: Self-pay | Admitting: Family

## 2021-08-16 ENCOUNTER — Other Ambulatory Visit (HOSPITAL_COMMUNITY): Payer: Self-pay

## 2021-08-16 MED ORDER — BUTALBITAL-APAP-CAFFEINE 50-325-40 MG PO TABS
1.0000 | ORAL_TABLET | Freq: Four times a day (QID) | ORAL | 0 refills | Status: DC | PRN
  Filled 2021-08-16: qty 30, 8d supply, fill #0

## 2021-08-19 ENCOUNTER — Other Ambulatory Visit (HOSPITAL_COMMUNITY): Payer: Self-pay

## 2021-09-10 ENCOUNTER — Other Ambulatory Visit: Payer: Self-pay

## 2021-09-10 ENCOUNTER — Ambulatory Visit (INDEPENDENT_AMBULATORY_CARE_PROVIDER_SITE_OTHER): Payer: No Typology Code available for payment source | Admitting: Family

## 2021-09-10 ENCOUNTER — Ambulatory Visit (HOSPITAL_BASED_OUTPATIENT_CLINIC_OR_DEPARTMENT_OTHER)
Admission: RE | Admit: 2021-09-10 | Discharge: 2021-09-10 | Disposition: A | Discharge status: Home / Self Care | Payer: No Typology Code available for payment source | Source: Ambulatory Visit | Attending: Family | Admitting: Family

## 2021-09-10 VITALS — BP 108/72 | HR 76 | Temp 98.1°F | Resp 16 | Wt 149.0 lb

## 2021-09-10 DIAGNOSIS — M542 Cervicalgia: Secondary | ICD-10-CM

## 2021-09-10 MED ORDER — MELOXICAM 7.5 MG PO TABS
7.5000 mg | ORAL_TABLET | Freq: Every day | ORAL | 0 refills | Status: DC
  Filled 2021-09-10: qty 14, 14d supply, fill #0

## 2021-09-10 NOTE — Progress Notes (Signed)
Subjective:   By signing my name below, I, Lyric Barr-McArthur, attest that this documentation has been prepared under the direction and in the presence of Debbrah Alar, NP, 09/10/2021     Patient ID: Julie Webb, female    DOB: 1991-08-13, 30 y.o.   MRN: [id number]  Chief Complaint  Patient presents with   Neck Pain    Complains of right sided neck pain that radiates to her shoulder. Started back in August after falling, "getting worse"    HPI Patient is in today for an office visit.  Neck pain: She reports a fall in August 2022 when she was running after her son. She notes she fell on her right side which resulted in pain in her right shoulder and right side of her neck. She mentions joint pain in her shoulder and also says that her neck pain gives her migraines. She is not able to raise her arm straight to the side on her own because she feels like it is "stuck". She has been using tylenol to help with this but is still finding no relief.   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   Migraines    Vaginal Pap smear, abnormal     Past Surgical History:  Procedure Laterality Date   CHOLECYSTECTOMY Right 09/19/2020   NO PAST SURGERIES      Family History  Problem Relation Age of Onset   Hyperlipidemia Mother    Mental illness Mother    Fibroids Mother        uterine   Hypertension Father    Heart attack Father    Heart disease Maternal Grandmother    Hyperlipidemia Maternal Grandmother    Hypertension Maternal Grandmother    Heart disease Maternal Grandfather    Hyperlipidemia Maternal Grandfather    Hypertension  Maternal Grandfather    Pancreatic cancer Paternal Grandmother    Mesothelioma Paternal Grandfather     Social History   Socioeconomic History   Marital status: Married    Spouse name: Not on file   Number of children: Not on file   Years of education: Not on file   Highest education level: Not on file  Occupational History   Not on file  Tobacco Use   Smoking status: Never   Smokeless tobacco: Never  Vaping Use   Vaping Use: Never used  Substance and Sexual Activity   Alcohol use: Yes    Comment: occasional   Drug use: No   Sexual activity: Yes    Birth control/protection: I.U.D.  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 30 tablet 2   brexpiprazole (REXULTI) 1 MG TABS tablet Take 1 tablet (1 mg total) by mouth daily. 90 tablet 1   buPROPion (WELLBUTRIN XL) 150 MG 24 hr tablet Take 1 tablet (150 mg total) by mouth daily. 90 tablet 1   buPROPion (WELLBUTRIN XL) 300 MG 24 hr tablet Take 1 tablet (300 mg total) by mouth daily after breakfast 90 tablet 1   butalbital-acetaminophen-caffeine (FIORICET) 50-325-40 MG tablet Take 1 tablet by mouth every 6 (six) hours as needed for headache. 30 tablet 0   DULoxetine (CYMBALTA) 60 MG capsule Take 1 capsule (60 mg total) by mouth daily after breakfast. 90 capsule 1   omeprazole (PRILOSEC) 40 MG capsule Take 1 capsule (40 mg total) by mouth daily. 30  capsule 0   Prenat w/o A-FeCbn-DSS-FA-DHA (PRENAISSANCE PLUS) 28-1-250 MG CAPS Take 1 capsule by mouth daily.     vitamin B-12 (CYANOCOBALAMIN) 1000 MCG tablet Take 1 tablet (1,000 mcg total) by mouth daily.     Vitamin D, Ergocalciferol, (DRISDOL) 1.25 MG (50000 UNIT) CAPS capsule Take 1 capsule (50,000 Units total) by mouth every 7 (seven) days. 12 capsule 0   No facility-administered medications prior to visit.    Allergies  Allergen Reactions   Phenergan [Promethazine Hcl] Other (See Comments)    Restlessness w/ IV Phenergen. Probable mild EPS. Tolerates if taken w/ Benadryl.     Review of Systems  Musculoskeletal:  Positive for falls (in august 2022) and neck pain (on right side).       (+) right shoulder pain       Objective:    Physical Exam Constitutional:      General: She is not in acute distress.    Appearance: Normal appearance. She is not ill-appearing.  HENT:     Head: Normocephalic and atraumatic.     Right Ear: External ear normal.     Left Ear: External ear normal.  Eyes:     Extraocular Movements: Extraocular movements intact.     Pupils: Pupils are equal, round, and reactive to light.  Cardiovascular:     Rate and Rhythm: Normal rate and regular rhythm.     Heart sounds: Normal heart sounds. No murmur heard.   No gallop.  Pulmonary:     Effort: Pulmonary effort is normal. No respiratory distress.     Breath sounds: Normal breath sounds. No wheezing or rales.  Musculoskeletal:     Right shoulder: Decreased range of motion (limited abduction range of motion).  Skin:    General: Skin is warm and dry.  Neurological:     Mental Status: She is alert and oriented to person, place, and time.  Psychiatric:        Behavior: Behavior normal.        Judgment: Judgment normal.    BP 108/72 (BP Location: Right Arm, Patient Position: Sitting, Cuff Size: Small)   Pulse 76   Temp 98.1 F (36.7 C) (Oral)   Resp 16   Wt 149 lb (67.6 kg)   SpO2 100%   BMI 25.58  kg/m  Wt Readings from Last 3 Encounters:  09/10/21 149 lb (67.6 kg)  08/09/21 146 lb (66.2 kg)  07/23/21 146 lb (66.2 kg)       Assessment &  Plan:   Problem List Items Addressed This Visit       Unprioritized   Neck pain - Primary    Uncontrolled. Will rx with meloxicam, declines muscle relaxer.  Obtain x-ray of cspine and refer to sports med.       Relevant Orders   Ambulatory referral to Sports Medicine   DG Cervical Spine Complete (Completed)   Meds ordered this encounter  Medications   meloxicam (MOBIC) 7.5 MG tablet    Sig: Take 1 tablet (7.5 mg total) by mouth daily.    Dispense:  14 tablet    Refill:  0    Order Specific Question:   Supervising Provider    Answer:   Penni Homans A [[id number]]    I, Debbrah Alar, NP, personally preformed the services described in this documentation.  All medical record entries made by the scribe were at my direction and in my presence.  I have reviewed the chart and discharge instructions (if applicable) and agree that the record reflects my personal performance and is accurate and complete. 09/10/2021  I,Lyric Barr-McArthur,acting as a Education administrator for Nance Pear, NP.,have documented all relevant documentation on the behalf of Nance Pear, NP,as directed by  Nance Pear, NP while in the presence of Nance Pear, NP.  Nance Pear, NP

## 2021-09-11 ENCOUNTER — Other Ambulatory Visit (HOSPITAL_COMMUNITY): Payer: Self-pay

## 2021-09-13 DIAGNOSIS — M542 Cervicalgia: Secondary | ICD-10-CM | POA: Insufficient documentation

## 2021-09-13 NOTE — Assessment & Plan Note (Signed)
Uncontrolled. Will rx with meloxicam, declines muscle relaxer.  Obtain x-ray of cspine and refer to sports med.

## 2021-09-18 ENCOUNTER — Other Ambulatory Visit (HOSPITAL_COMMUNITY): Payer: Self-pay

## 2021-09-25 ENCOUNTER — Encounter: Payer: Self-pay | Admitting: Family

## 2021-09-25 DIAGNOSIS — Z0184 Encounter for antibody response examination: Secondary | ICD-10-CM

## 2021-10-02 ENCOUNTER — Other Ambulatory Visit: Payer: No Typology Code available for payment source

## 2021-10-03 ENCOUNTER — Other Ambulatory Visit (INDEPENDENT_AMBULATORY_CARE_PROVIDER_SITE_OTHER): Payer: No Typology Code available for payment source

## 2021-10-03 DIAGNOSIS — Z0184 Encounter for antibody response examination: Secondary | ICD-10-CM

## 2021-10-03 DIAGNOSIS — E538 Deficiency of other specified B group vitamins: Secondary | ICD-10-CM

## 2021-10-03 DIAGNOSIS — E559 Vitamin D deficiency, unspecified: Secondary | ICD-10-CM

## 2021-10-04 ENCOUNTER — Telehealth (INDEPENDENT_AMBULATORY_CARE_PROVIDER_SITE_OTHER): Payer: No Typology Code available for payment source | Admitting: Adult Health

## 2021-10-04 ENCOUNTER — Other Ambulatory Visit (HOSPITAL_COMMUNITY): Payer: Self-pay

## 2021-10-04 ENCOUNTER — Encounter: Payer: Self-pay | Admitting: Adult Health

## 2021-10-04 ENCOUNTER — Other Ambulatory Visit: Payer: Self-pay

## 2021-10-04 DIAGNOSIS — F410 Panic disorder [episodic paroxysmal anxiety] without agoraphobia: Secondary | ICD-10-CM

## 2021-10-04 DIAGNOSIS — F411 Generalized anxiety disorder: Secondary | ICD-10-CM

## 2021-10-04 DIAGNOSIS — F4010 Social phobia, unspecified: Secondary | ICD-10-CM

## 2021-10-04 DIAGNOSIS — F330 Major depressive disorder, recurrent, mild: Secondary | ICD-10-CM | POA: Diagnosis not present

## 2021-10-04 LAB — VITAMIN B12: Vitamin B-12: >1550 pg/mL — ABNORMAL HIGH (ref 211–911)

## 2021-10-04 MED ORDER — BREXPIPRAZOLE 1 MG PO TABS
1.0000 mg | ORAL_TABLET | Freq: Every day | ORAL | 1 refills | Status: DC
  Filled 2021-10-04 – 2022-01-08 (×2): qty 90, 90d supply, fill #0

## 2021-10-04 MED ORDER — BUPROPION HCL ER (XL) 300 MG PO TB24
300.0000 mg | ORAL_TABLET | Freq: Every day | ORAL | 1 refills | Status: DC
  Filled 2021-10-04: qty 90, 90d supply, fill #0

## 2021-10-04 MED ORDER — ARMODAFINIL 250 MG PO TABS
250.0000 mg | ORAL_TABLET | Freq: Every day | ORAL | 2 refills | Status: DC
  Filled 2021-10-04: qty 30, 30d supply, fill #0
  Filled 2021-12-10: qty 30, 30d supply, fill #1

## 2021-10-04 MED ORDER — BUPROPION HCL ER (XL) 150 MG PO TB24
150.0000 mg | ORAL_TABLET | Freq: Every day | ORAL | 1 refills | Status: DC
  Filled 2021-10-04: qty 90, 90d supply, fill #0

## 2021-10-04 MED ORDER — DULOXETINE HCL 60 MG PO CPEP
60.0000 mg | ORAL_CAPSULE | Freq: Every day | ORAL | 1 refills | Status: DC
Start: 2021-10-04 — End: 2022-01-16
  Filled 2021-10-04: qty 90, 90d supply, fill #0

## 2021-10-04 NOTE — Progress Notes (Signed)
LILAC HOFF [id number] 1990-11-16 30 y.o.  Virtual Visit via Video Note  I connected with pt @ on 10/04/21 at 12:00 PM EST by a video enabled telemedicine application and verified that I am speaking with the correct person using two identifiers.   I discussed the limitations of evaluation and management by telemedicine and the availability of in person appointments. The patient expressed understanding and agreed to proceed.  I discussed the assessment and treatment plan with the patient. The patient was provided an opportunity to ask questions and all were answered. The patient agreed with the plan and demonstrated an understanding of the instructions.   The patient was advised to call back or seek an in-person evaluation if the symptoms worsen or if the condition fails to improve as anticipated.  I provided 25 minutes of non-face-to-face time during this encounter.  The patient was located at home.  The provider was located at [place].   Aloha Gell, NP   Subjective:   Patient ID:  Julie Webb is a 30 y.o. (DOB 1991/01/30) female.  Chief Complaint: No chief complaint on file.   HPI [address] presents for follow-up of SAD, Panic disorder, GAD, MDD.  Describes mood today as "ok". Pleasant. Tearful. Mood symptoms - reports depression, anxiety and irritability. Stating "things are a lot better". Feels like medications are working well for her. Family doing well. Stable interest and motivation. Taking medications as prescribed.  Energy levels stable. Active, does not have a regular exercise routine.  Enjoys some usual interests and activities. Married. Lives with husband and 39 month old son. Spending time with family. Appetite adequate. Weight gain - 140 pounds. Sleeps well most nights. Averages 8 to 9 hours.  Focus and concentration stable. Completing tasks. Managing aspects of household. Working in ICU - Marine scientist.  Denies SI or HI.  Denies AH or  VH.    Review of Systems:  Review of Systems  Musculoskeletal:  Negative for gait problem.  Neurological:  Negative for tremors.  Psychiatric/Behavioral:         Please refer to HPI   Medications: I have reviewed the patient's current medications.  Current Outpatient Medications  Medication Sig Dispense Refill   Armodafinil 250 MG tablet Take 1 tablet (250 mg total) by mouth daily. 30 tablet 2   brexpiprazole (REXULTI) 1 MG TABS tablet Take 1 tablet (1 mg total) by mouth daily. 90 tablet 1   buPROPion (WELLBUTRIN XL) 150 MG 24 hr tablet Take 1 tablet (150 mg total) by mouth daily. 90 tablet 1   buPROPion (WELLBUTRIN XL) 300 MG 24 hr tablet Take 1 tablet (300 mg total) by mouth daily after breakfast 90 tablet 1   butalbital-acetaminophen-caffeine (FIORICET) 50-325-40 MG tablet Take 1 tablet by mouth every 6 (six) hours as needed for headache. 30 tablet 0   DULoxetine (CYMBALTA) 60 MG capsule Take 1 capsule (60 mg total) by mouth daily after breakfast. 90 capsule 1   meloxicam (MOBIC) 7.5 MG tablet Take 1 tablet (7.5 mg total) by mouth daily. 14 tablet 0   omeprazole (PRILOSEC) 40 MG capsule Take 1 capsule (40 mg total) by mouth daily. 30 capsule 0   Prenat w/o A-FeCbn-DSS-FA-DHA (PRENAISSANCE PLUS) 28-1-250 MG CAPS Take 1 capsule by mouth daily.     vitamin B-12 (CYANOCOBALAMIN) 1000 MCG tablet Take 1 tablet (1,000 mcg total) by mouth daily.     Vitamin D, Ergocalciferol, (DRISDOL) 1.25 MG (50000 UNIT) CAPS capsule Take 1 capsule (50,000 Units  total) by mouth every 7 (seven) days. 12 capsule 0   No current facility-administered medications for this visit.    Medication Side Effects: None  Allergies:  Allergies  Allergen Reactions   Phenergan [Promethazine Hcl] Other (See Comments)    Restlessness w/ IV Phenergen. Probable mild EPS. Tolerates if taken w/ Benadryl.     Past Medical History:  Diagnosis Date   Anxiety    Depression    Family history of adverse reaction to  anesthesia    mother- N/V    Glomerulonephritis, poststreptococcal    age 30   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   Migraines    Vaginal Pap smear, abnormal     Family History  Problem Relation Age of Onset   Hyperlipidemia Mother    Mental illness Mother    Fibroids Mother        uterine   Hypertension Father    Heart attack Father    Heart disease Maternal Grandmother    Hyperlipidemia Maternal Grandmother    Hypertension Maternal Grandmother    Heart disease Maternal Grandfather    Hyperlipidemia Maternal Grandfather    Hypertension Maternal Grandfather    Pancreatic cancer Paternal Grandmother    Mesothelioma Paternal Grandfather     Social History   Socioeconomic History   Marital status: Married    Spouse name: Not on file   Number of children: Not on file   Years of education: Not on file   Highest education level: Not on file  Occupational History   Not on file  Tobacco Use   Smoking status: Never   Smokeless tobacco: Never  Vaping Use   Vaping Use: Never used  Substance and Sexual Activity   Alcohol use: Yes    Comment: occasional   Drug use: No   Sexual activity: Yes    Birth control/protection: I.U.D.  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   Please see review of systems for further details on the patient's review from today.   Objective:   Physical Exam:  There were no vitals  taken for this visit.  Physical Exam Constitutional:      General: She is not in acute distress. Musculoskeletal:        General: No deformity.  Neurological:     Mental Status: She is alert and oriented to person, place, and time.     Coordination: Coordination normal.  Psychiatric:        Attention and Perception: Attention and perception normal. She does not perceive auditory or visual hallucinations.        Mood and Affect: Mood normal. Mood is not anxious or depressed. Affect is not labile, blunt, angry or inappropriate.        Speech: Speech normal.        Behavior: Behavior normal.  Thought Content: Thought content normal. Thought content is not paranoid or delusional. Thought content does not include homicidal or suicidal ideation. Thought content does not include homicidal or suicidal plan.        Cognition and Memory: Cognition and memory normal.        Judgment: Judgment normal.     Comments: Insight intact    Lab Review:     Component Value Date/Time   NA 137 06/02/2021 0258   K 4.2 06/02/2021 0258   CL 103 06/02/2021 0258   CO2 25 06/02/2021 0258   GLUCOSE 93 06/02/2021 0258   BUN 10 06/02/2021 0258   CREATININE 0.79 06/02/2021 0258   CALCIUM 9.3 06/02/2021 0258   PROT 7.4 06/02/2021 0258   ALBUMIN 4.1 06/02/2021 0258   AST 17 06/02/2021 0258   ALT 6 06/02/2021 0258   ALKPHOS 62 06/02/2021 0258   BILITOT 0.5 06/02/2021 0258   GFRNONAA >60 06/02/2021 0258   GFRAA >60 06/05/2019 1928       Component Value Date/Time   WBC 11.6 (H) 06/02/2021 0258   RBC 4.86 06/02/2021 0258   HGB 14.7 06/02/2021 0258   HCT 43.6 06/02/2021 0258   PLT 219 06/02/2021 0258   MCV 89.7 06/02/2021 0258   MCH 30.2 06/02/2021 0258   MCHC 33.7 06/02/2021 0258   RDW 12.5 06/02/2021 0258   LYMPHSABS 1.5 06/02/2021 0258   MONOABS 0.8 06/02/2021 0258   EOSABS 0.1 06/02/2021 0258   BASOSABS 0.1 06/02/2021 0258    No results found for: POCLITH, LITHIUM   No results found  for: PHENYTOIN, PHENOBARB, VALPROATE, CBMZ   .res Assessment: Plan:   Plan:  PDMP reviewed  1. Continue Cymbalta 60mg  daily 2. Continue Wellbutrin XL 450mg  daily - denies seizure history - move to morning 3. Continue Rexulti 0.5mg  daily  4. 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  Discussed potential metabolic side effects associated with atypical antipsychotics, as well as potential risk for movement side effects. Advised pt to contact office if movement side effects occur.  Diagnoses and all orders for this visit:  Panic disorder  Generalized anxiety disorder  Mild recurrent major depression ([place])  Social anxiety disorder     Please see After Visit Summary for patient specific instructions.  Future Appointments  Date Time Provider [place]  10/29/2021 11:00 AM [facility]    No orders of the defined types were placed in this encounter.     -------------------------------

## 2021-10-09 ENCOUNTER — Telehealth: Payer: Self-pay | Admitting: Family

## 2021-10-09 NOTE — Telephone Encounter (Signed)
Patient was advised of all  results and was scheduled for a nurse visit for first Heplisav B injection and will need to repeat in 1 month as per provider's orders.

## 2021-10-09 NOTE — Telephone Encounter (Signed)
Please advise pt that her hep B titer is low. I would recommend that she restart series Hapsilav one injection now and repeat in 1 month.   B12 level looks good.  Vit D is still pending.

## 2021-10-09 NOTE — Telephone Encounter (Signed)
Called patient to go over results LVM for patient to call office back.

## 2021-10-11 LAB — VITAMIN D 1,25 DIHYDROXY
Vitamin D 1, 25 (OH)2 Total: 59 pg/mL (ref 18–72)
Vitamin D2 1, 25 (OH)2: 37 pg/mL
Vitamin D3 1, 25 (OH)2: 22 pg/mL

## 2021-10-11 LAB — HEPATITIS B SURFACE ANTIBODY, QUANTITATIVE: Hep B S AB Quant (Post): 5 m[IU]/mL — ABNORMAL LOW (ref >=10)

## 2021-10-15 ENCOUNTER — Other Ambulatory Visit (HOSPITAL_COMMUNITY): Payer: Self-pay

## 2021-10-16 ENCOUNTER — Ambulatory Visit: Payer: No Typology Code available for payment source

## 2021-10-22 ENCOUNTER — Ambulatory Visit: Payer: No Typology Code available for payment source

## 2021-10-24 ENCOUNTER — Telehealth: Payer: Self-pay | Admitting: *Deleted

## 2021-10-24 NOTE — Telephone Encounter (Signed)
Pt has nurse visit for Heplisav on 10/29/21 and also has lab appointment the same day.  I do not see any future orders in Epic.  Can you place order if appropriate or contact pt to cancel lab appt if not needed at this time?

## 2021-10-29 ENCOUNTER — Other Ambulatory Visit: Payer: No Typology Code available for payment source

## 2021-10-29 ENCOUNTER — Ambulatory Visit (INDEPENDENT_AMBULATORY_CARE_PROVIDER_SITE_OTHER): Payer: No Typology Code available for payment source

## 2021-10-29 DIAGNOSIS — Z23 Encounter for immunization: Secondary | ICD-10-CM | POA: Diagnosis not present

## 2021-10-29 NOTE — Progress Notes (Signed)
Patient came in today for first dose of Hepislav-B , injection given IM , left deltoid , patient tolerated injection well and is scheduled for her second dose next month

## 2021-11-19 ENCOUNTER — Telehealth: Payer: Self-pay | Admitting: Family

## 2021-11-19 ENCOUNTER — Ambulatory Visit (INDEPENDENT_AMBULATORY_CARE_PROVIDER_SITE_OTHER): Payer: No Typology Code available for payment source | Admitting: Family

## 2021-11-19 ENCOUNTER — Other Ambulatory Visit (HOSPITAL_COMMUNITY): Payer: Self-pay

## 2021-11-19 VITALS — BP 125/76 | HR 114 | Temp 98.5°F | Resp 16 | Ht 64.0 in | Wt 152.8 lb

## 2021-11-19 DIAGNOSIS — G250 Essential tremor: Secondary | ICD-10-CM

## 2021-11-19 DIAGNOSIS — R251 Tremor, unspecified: Secondary | ICD-10-CM

## 2021-11-19 DIAGNOSIS — G43809 Other migraine, not intractable, without status migrainosus: Secondary | ICD-10-CM | POA: Diagnosis not present

## 2021-11-19 MED ORDER — PROPRANOLOL HCL 20 MG PO TABS
20.0000 mg | ORAL_TABLET | Freq: Two times a day (BID) | ORAL | 1 refills | Status: DC
  Filled 2021-11-19: qty 60, 30d supply, fill #0
  Filled 2022-01-03: qty 60, 30d supply, fill #1

## 2021-11-19 MED ORDER — PROPRANOLOL HCL 20 MG PO TABS
20.0000 mg | ORAL_TABLET | Freq: Three times a day (TID) | ORAL | 1 refills | Status: DC
  Filled 2021-11-19: qty 90, 30d supply, fill #0

## 2021-11-19 NOTE — Assessment & Plan Note (Signed)
Increase in frequency- will refer back to neurology. Propranolol may also help with migraine prophylaxis.

## 2021-11-19 NOTE — Telephone Encounter (Signed)
See mychart.  

## 2021-11-19 NOTE — Progress Notes (Signed)
Subjective:     Patient ID: Julie Webb, female    DOB: December 26, 1990, 31 y.o.   MRN: [id number]  Chief Complaint  Patient presents with   Tremors    Here for Tremors    HPI Patient is in today to discuss tremors. She is accompanied today by her husband. Reports that she has had a tremor as long as she can remember.  It was manageable, until recently. 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 This caused her to switch positions at work to a position that does not require her to use as many fine motor skills.  She reports that she sometimes also has a head tremor.  Mom and brother also have a tremor.    Notes frequency of her migraines has increased recently.   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   Migraines    Vaginal Pap smear, abnormal     Past Surgical History:  Procedure Laterality Date   CHOLECYSTECTOMY Right 09/19/2020   NO PAST SURGERIES      Family History  Problem Relation Age of Onset   Hyperlipidemia Mother    Mental illness Mother    Fibroids Mother        uterine   Hypertension Father    Heart attack Father    Heart disease Maternal Grandmother    Hyperlipidemia Maternal Grandmother    Hypertension Maternal Grandmother    Heart disease Maternal Grandfather    Hyperlipidemia Maternal Grandfather    Hypertension Maternal Grandfather    Pancreatic cancer Paternal Grandmother    Mesothelioma Paternal Grandfather     Social History   Socioeconomic History   Marital status: Married    Spouse name: Not on file   Number of children: Not on file   Years of education: Not on file   Highest  education level: Not on file  Occupational History   Not on file  Tobacco Use   Smoking status: Never   Smokeless tobacco: Never  Vaping Use   Vaping Use: Never used  Substance and Sexual Activity   Alcohol use: Yes    Comment: occasional   Drug use: No   Sexual activity: Yes    Birth control/protection: I.U.D.  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 30 tablet 2   brexpiprazole (REXULTI) 1 MG TABS  tablet Take 1 tablet (1 mg total) by mouth daily. 90 tablet 1   buPROPion (WELLBUTRIN XL) 150 MG 24 hr tablet Take 1 tablet (150 mg total) by mouth daily. 90 tablet 1   buPROPion (WELLBUTRIN XL) 300 MG 24 hr tablet Take 1 tablet (300 mg total) by mouth daily after breakfast 90 tablet 1   butalbital-acetaminophen-caffeine (FIORICET) 50-325-40 MG tablet Take 1 tablet by mouth every 6 (six) hours as needed for headache. 30 tablet 0   DULoxetine (CYMBALTA) 60 MG capsule Take 1 capsule (60 mg total) by mouth daily after breakfast. 90 capsule 1   meloxicam (MOBIC) 7.5 MG tablet Take 1 tablet (7.5 mg total) by mouth daily. 14 tablet 0   Prenat w/o A-FeCbn-DSS-FA-DHA (PRENAISSANCE PLUS) 28-1-250 MG CAPS Take 1 capsule by mouth daily.     vitamin B-12 (CYANOCOBALAMIN) 1000 MCG tablet Take 1 tablet (1,000 mcg total) by mouth daily.     Vitamin D, Ergocalciferol, (DRISDOL) 1.25 MG (50000 UNIT) CAPS capsule Take 1  capsule (50,000 Units total) by mouth every 7 (seven) days. 12 capsule 0   omeprazole (PRILOSEC) 40 MG capsule Take 1 capsule (40 mg total) by mouth daily. 30 capsule 0   No facility-administered medications prior to visit.    Allergies  Allergen Reactions   Phenergan [Promethazine Hcl] Other (See Comments)    Restlessness w/ IV Phenergen. Probable mild EPS. Tolerates if taken w/ Benadryl.     ROS    See HPI Objective:    Physical Exam Constitutional:      General: She is not in acute distress.    Appearance: Normal appearance. She is well-developed.  HENT:     Head: Normocephalic and atraumatic.     Right Ear: External ear normal.     Left Ear: External ear normal.  Eyes:     General: No scleral icterus. Neck:     Thyroid: No thyromegaly.  Cardiovascular:     Rate and Rhythm: Normal rate and regular rhythm.     Heart sounds: Normal heart sounds. No murmur heard. Pulmonary:     Effort: Pulmonary effort is normal. No respiratory distress.     Breath sounds: Normal breath sounds. No wheezing.  Musculoskeletal:     Cervical back: Neck supple.  Skin:    General: Skin is warm and dry.  Neurological:     General: No focal deficit present.     Mental Status: She is alert and oriented to person, place, and time.     Cranial Nerves: Cranial nerves 2-12 are intact. No dysarthria.     Motor: Tremor present. No weakness or abnormal muscle tone.     Comments: Fine head tremor noted, fine bilateral hand tremor noted  Psychiatric:        Mood and Affect: Mood normal.        Behavior: Behavior normal.        Thought Content: Thought content normal.        Judgment: Judgment normal.    BP 125/76 (BP Location: Right Leg, Patient Position: Sitting, Cuff Size: Normal)    Pulse (!) 114    Temp 98.5 F (36.9 C) (Oral)    Resp 16    Ht 5\' 4"  (1.626 m)    Wt 152 lb 12.8 oz (69.3 kg)    SpO2 98%    BMI 26.23 kg/m  Wt Readings from Last 3 Encounters:  11/19/21 152 lb 12.8 oz (69.3 kg)   09/10/21 149 lb (67.6 kg)  08/09/21  146 lb (66.2 kg)       Assessment & Plan:   Problem List Items Addressed This Visit       Unprioritized   Tremor - Primary   Relevant Orders   Ambulatory referral to Neurology   Migraine    Increase in frequency- will refer back to neurology. Propranolol may also help with migraine prophylaxis.       Relevant Medications   propranolol (INDERAL) 20 MG tablet   Other Relevant Orders   Ambulatory referral to Neurology   Benign essential tremor    Given family hx of tremor, suspect benign essential tremor. Will refer to neurology for evaluation as well. In the meantime, will plan to rx with propranolol 20mg  bid. (Initially sent rx for TID, but per pharmacist recommendations decreased to BID due to potential interaction with wellbutrin).          I have discontinued Cheyla E. Horwitz's omeprazole. I have also changed her propranolol. Additionally, I am having her maintain her Prenaissance Plus, Vitamin D (Ergocalciferol), vitamin B-12, butalbital-acetaminophen-caffeine, meloxicam, buPROPion, buPROPion, Armodafinil, DULoxetine, and brexpiprazole.  Meds ordered this encounter  Medications   DISCONTD: propranolol (INDERAL) 20 MG tablet    Sig: Take 1 tablet (20 mg total) by mouth 3 (three) times daily.    Dispense:  90 tablet    Refill:  1    Order Specific Question:   Supervising Provider    Answer:   Penni Homans A [[id number]]   propranolol (INDERAL) 20 MG tablet    Sig: Take 1 tablet (20 mg total) by mouth 2 (two) times daily.    Dispense:  60 tablet    Refill:  1    Order Specific Question:   Supervising Provider    Answer:   Penni Homans A [[id number]]

## 2021-11-19 NOTE — Patient Instructions (Addendum)
Please start propranolol 20mg  three times daily.  You should be contacted about scheduling your appointment with neurology.  Send me an update in 1 week with your blood pressure and heart rate reading.

## 2021-11-19 NOTE — Assessment & Plan Note (Signed)
>>  ASSESSMENT AND PLAN FOR MIGRAINE WRITTEN ON 11/19/2021 12:27 PM BY O'SULLIVAN, Jaide Hillenburg, NP  Increase in frequency- will refer back to neurology. Propranolol  may also help with migraine prophylaxis.

## 2021-11-19 NOTE — Assessment & Plan Note (Signed)
Given family hx of tremor, suspect benign essential tremor. Will refer to neurology for evaluation as well. In the meantime, will plan to rx with propranolol 20mg  bid. (Initially sent rx for TID, but per pharmacist recommendations decreased to BID due to potential interaction with wellbutrin).

## 2021-11-22 IMAGING — DX DG CERVICAL SPINE COMPLETE 4+V
6 series · 6 of 6 positions shown · non-contrast
Comparison: CT 11/02/2017

CLINICAL DATA: Neck pain

EXAM:
CERVICAL SPINE - COMPLETE 4+ VIEW

[c-spine lat]
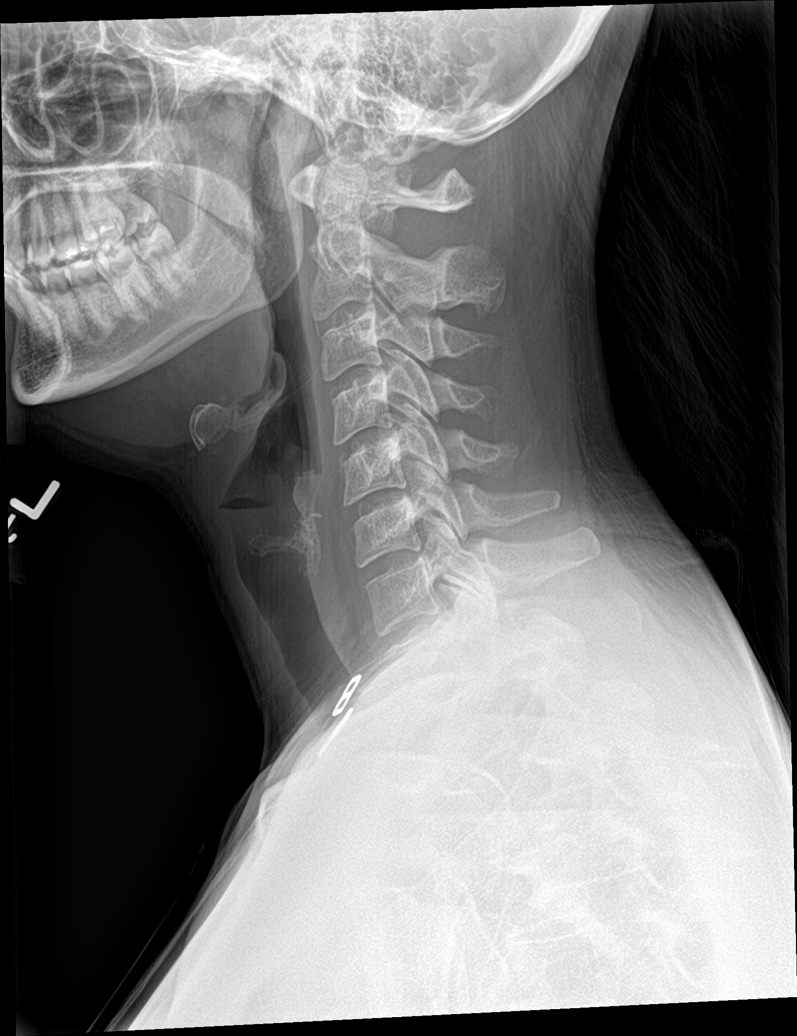

[c-spine obl (1 of 2)]
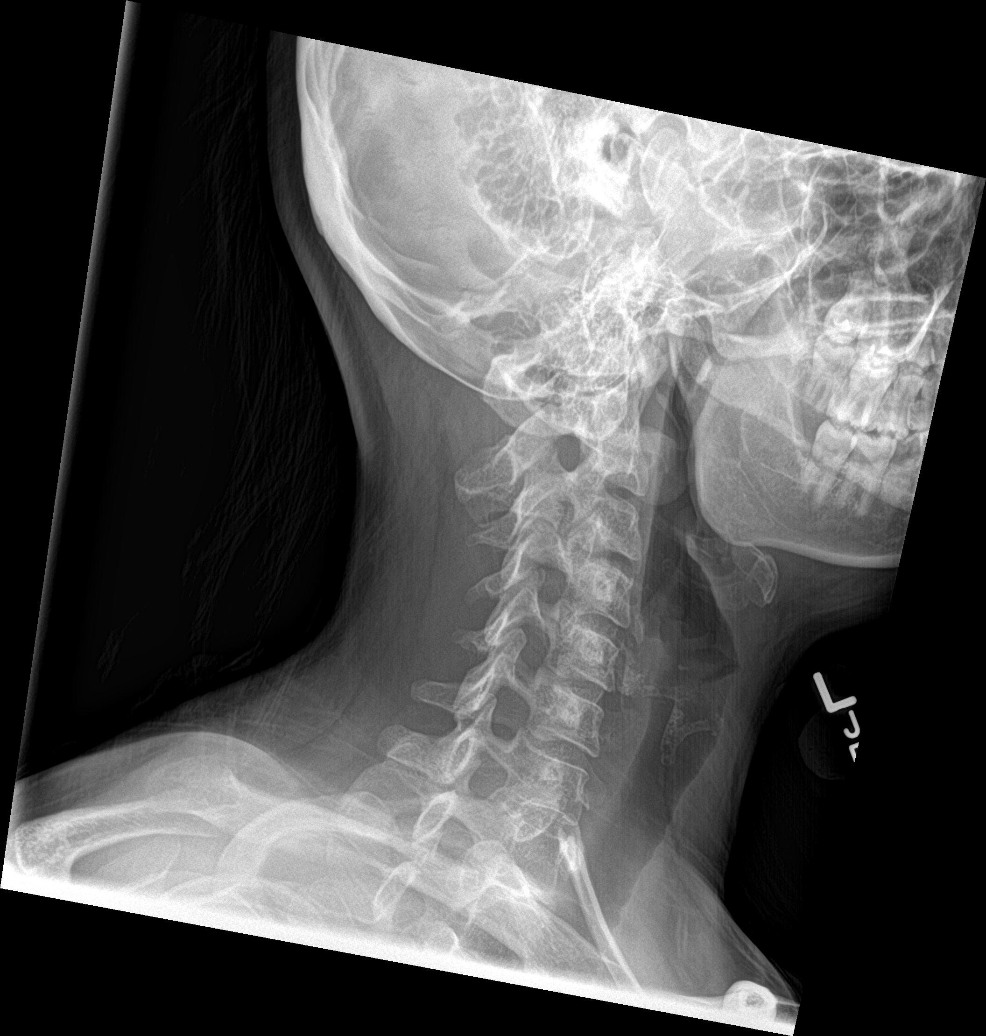

[c-spine obl (2 of 2)]
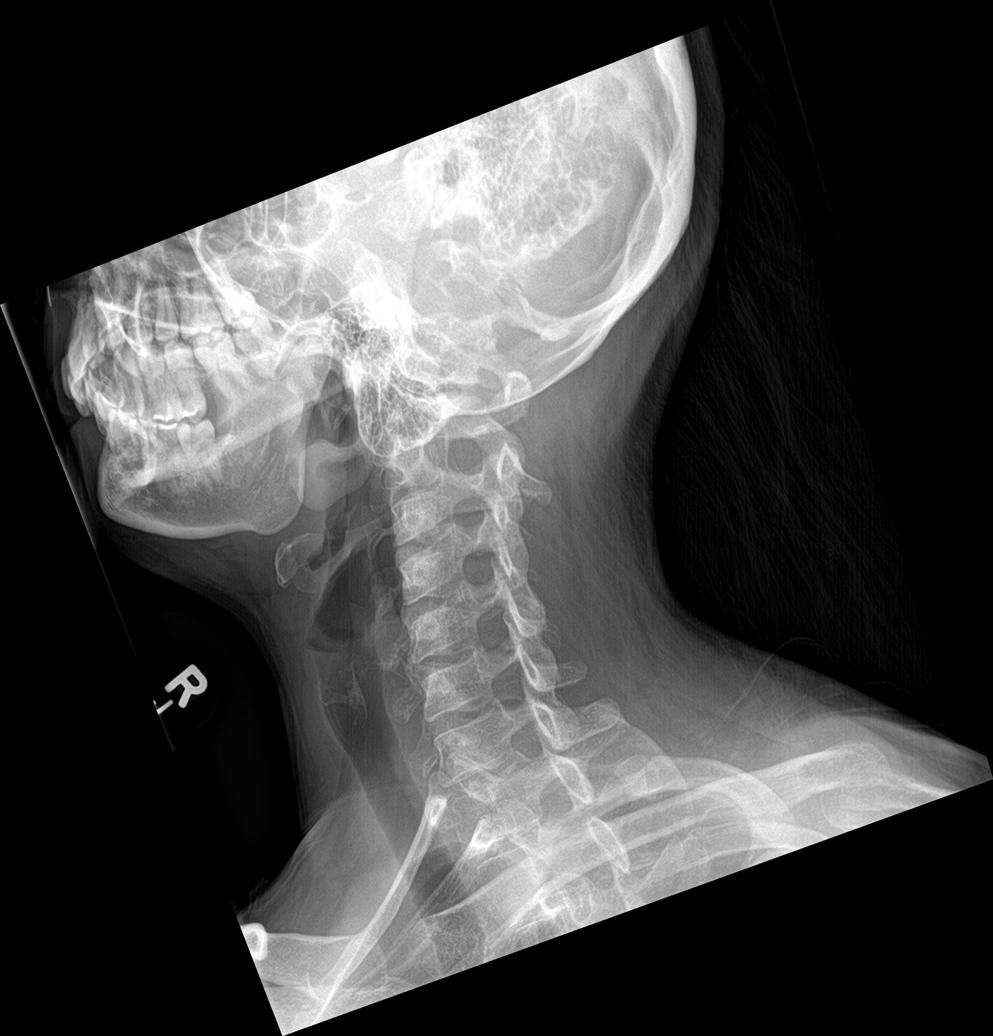

[c-spine ap]
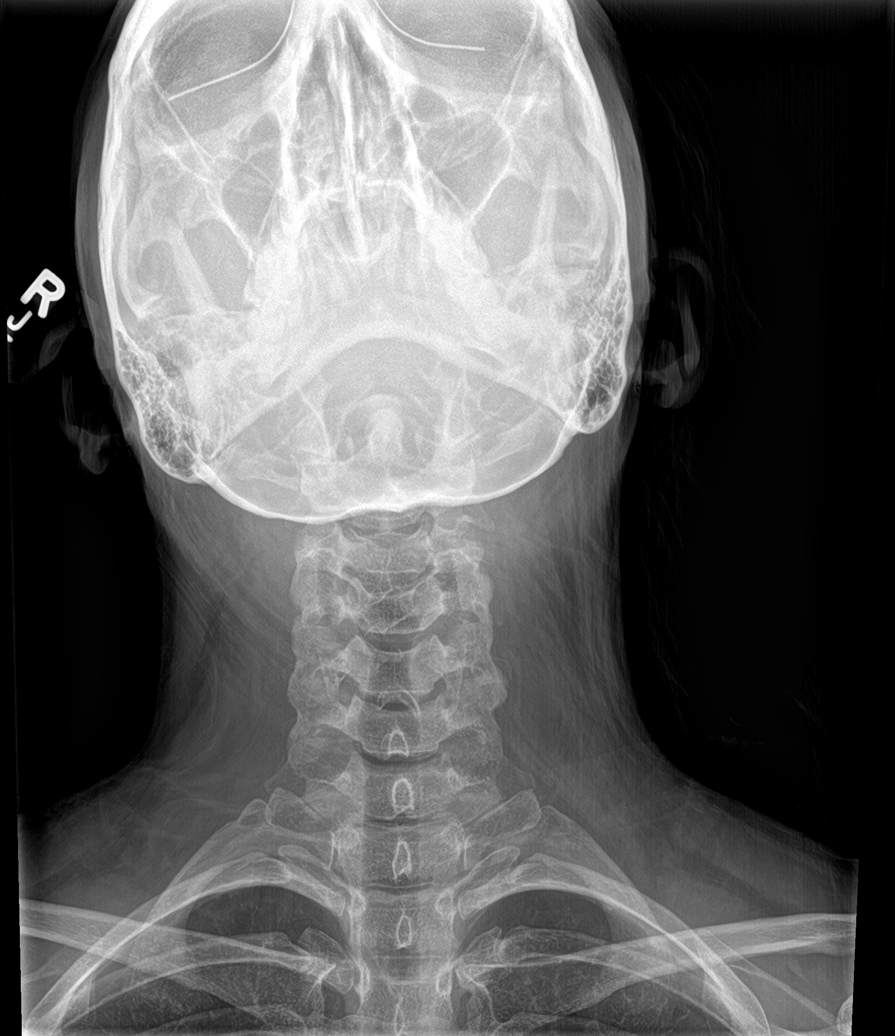

[c-spine open mouth]
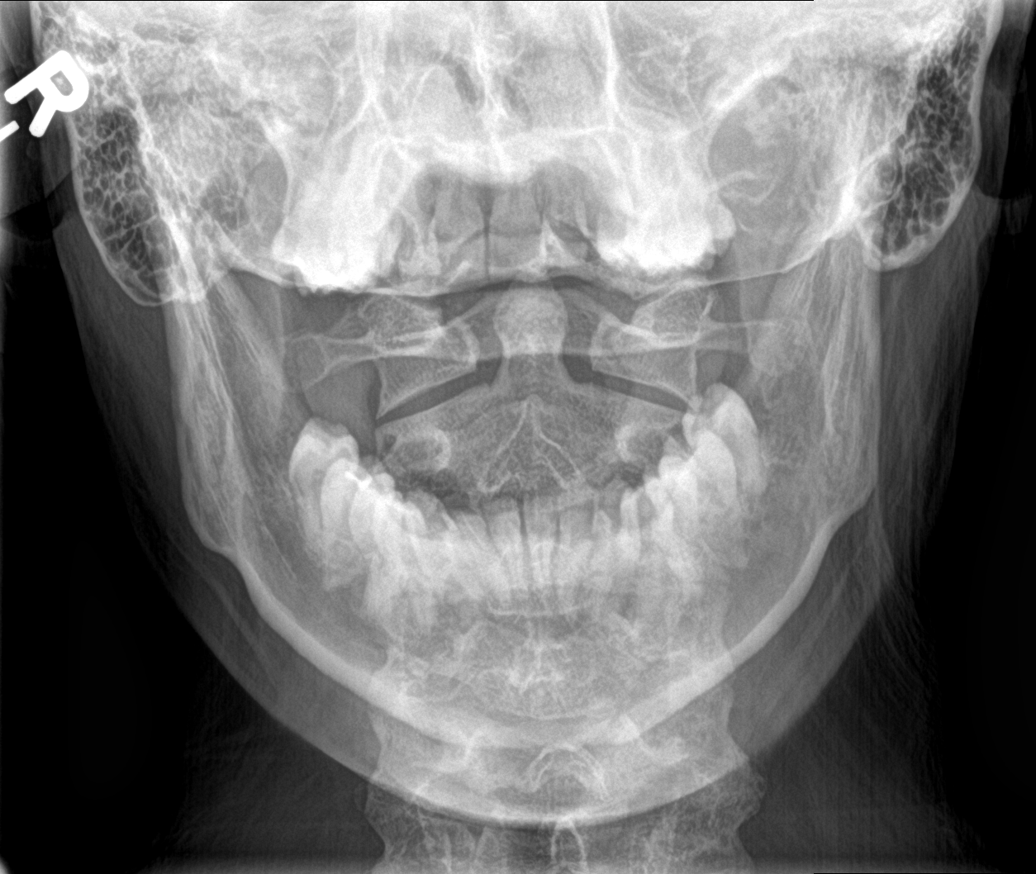

[c-spine swimmers]
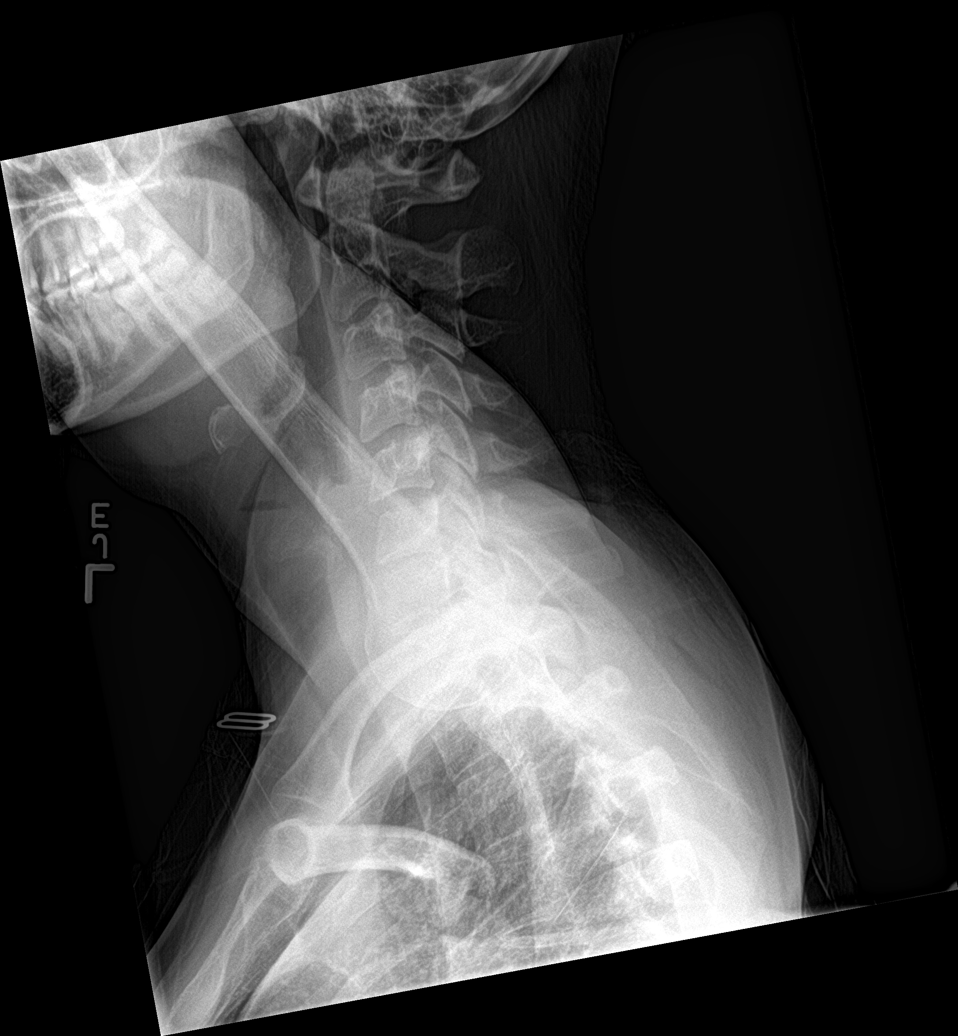

[6 of 6 positions shown; findings below may reference images not displayed]

FINDINGS: Straightening of the cervical spine. Vertebral body heights are
normal. The disc spaces are patent. The dens and lateral masses are
within normal limits
IMPRESSION: Straightening of the cervical spine.  Otherwise negative

## 2021-11-26 ENCOUNTER — Institutional Professional Consult (permissible substitution): Payer: No Typology Code available for payment source | Admitting: Neurology

## 2021-11-28 ENCOUNTER — Ambulatory Visit (HOSPITAL_COMMUNITY)
Admission: EM | Admit: 2021-11-28 | Discharge: 2021-11-29 | Disposition: A | Discharge status: Home / Self Care | Payer: No Typology Code available for payment source | Attending: Behavioral Health | Admitting: Behavioral Health

## 2021-11-28 DIAGNOSIS — Z79899 Other long term (current) drug therapy: Secondary | ICD-10-CM | POA: Diagnosis not present

## 2021-11-28 DIAGNOSIS — Z20822 Contact with and (suspected) exposure to covid-19: Secondary | ICD-10-CM | POA: Diagnosis not present

## 2021-11-28 DIAGNOSIS — R45851 Suicidal ideations: Secondary | ICD-10-CM | POA: Diagnosis present

## 2021-11-28 DIAGNOSIS — R112 Nausea with vomiting, unspecified: Secondary | ICD-10-CM | POA: Insufficient documentation

## 2021-11-28 DIAGNOSIS — R4587 Impulsiveness: Secondary | ICD-10-CM | POA: Diagnosis not present

## 2021-11-28 DIAGNOSIS — F329 Major depressive disorder, single episode, unspecified: Secondary | ICD-10-CM | POA: Diagnosis not present

## 2021-11-28 DIAGNOSIS — R197 Diarrhea, unspecified: Secondary | ICD-10-CM | POA: Insufficient documentation

## 2021-11-28 DIAGNOSIS — F411 Generalized anxiety disorder: Secondary | ICD-10-CM | POA: Insufficient documentation

## 2021-11-28 DIAGNOSIS — Z9151 Personal history of suicidal behavior: Secondary | ICD-10-CM | POA: Diagnosis not present

## 2021-11-28 LAB — CBC WITH DIFFERENTIAL/PLATELET
Abs Immature Granulocytes: 0.02 10*3/uL (ref 0.00–0.07)
Basophils Absolute: 0 10*3/uL (ref 0.0–0.1)
Basophils Relative: 1 %
Eosinophils Absolute: 0.1 10*3/uL (ref 0.0–0.5)
Eosinophils Relative: 1 %
HCT: 43.9 % (ref 36.0–46.0)
Hemoglobin: 15 g/dL (ref 12.0–15.0)
Immature Granulocytes: 0 %
Lymphocytes Relative: 30 %
Lymphs Abs: 1.6 10*3/uL (ref 0.7–4.0)
MCH: 30.6 pg (ref 26.0–34.0)
MCHC: 34.2 g/dL (ref 30.0–36.0)
MCV: 89.6 fL (ref 80.0–100.0)
Monocytes Absolute: 0.3 10*3/uL (ref 0.1–1.0)
Monocytes Relative: 6 %
Neutro Abs: 3.3 10*3/uL (ref 1.7–7.7)
Neutrophils Relative %: 62 %
Platelets: 254 10*3/uL (ref 150–400)
RBC: 4.9 MIL/uL (ref 3.87–5.11)
RDW: 12.4 % (ref 11.5–15.5)
WBC: 5.3 10*3/uL (ref 4.0–10.5)
nRBC: 0 % (ref 0.0–0.2)

## 2021-11-28 LAB — POCT URINE DRUG SCREEN - MANUAL ENTRY (I-SCREEN)
POC Amphetamine UR: NOT DETECTED
POC Buprenorphine (BUP): NOT DETECTED
POC Cocaine UR: NOT DETECTED
POC Marijuana UR: NOT DETECTED
POC Methadone UR: NOT DETECTED
POC Methamphetamine UR: NOT DETECTED
POC Morphine: NOT DETECTED
POC Oxazepam (BZO): NOT DETECTED
POC Oxycodone UR: NOT DETECTED
POC Secobarbital (BAR): NOT DETECTED

## 2021-11-28 LAB — COMPREHENSIVE METABOLIC PANEL
ALT: 18 U/L (ref 0–44)
AST: 18 U/L (ref 15–41)
Albumin: 3.8 g/dL (ref 3.5–5.0)
Alkaline Phosphatase: 55 U/L (ref 38–126)
Anion gap: 6 (ref 5–15)
BUN: 7 mg/dL (ref 6–20)
CO2: 28 mmol/L (ref 22–32)
Calcium: 9.4 mg/dL (ref 8.9–10.3)
Chloride: 106 mmol/L (ref 98–111)
Creatinine, Ser: 0.75 mg/dL (ref 0.44–1.00)
GFR, Estimated: >60 mL/min (ref >60)
Glucose, Bld: 94 mg/dL (ref 70–99)
Potassium: 3.9 mmol/L (ref 3.5–5.1)
Sodium: 140 mmol/L (ref 135–145)
Total Bilirubin: 0.4 mg/dL (ref 0.3–1.2)
Total Protein: 7 g/dL (ref 6.5–8.1)

## 2021-11-28 LAB — LIPID PANEL
Cholesterol: 151 mg/dL (ref 0–200)
HDL: 49 mg/dL (ref >40)
LDL Cholesterol: 89 mg/dL (ref 0–99)
Total CHOL/HDL Ratio: 3.1 RATIO
Triglycerides: 65 mg/dL (ref <150)
VLDL: 13 mg/dL (ref 0–40)

## 2021-11-28 LAB — TSH: TSH: 1.143 u[IU]/mL (ref 0.350–4.500)

## 2021-11-28 LAB — HEMOGLOBIN A1C
Hgb A1c MFr Bld: 4.7 % — ABNORMAL LOW (ref 4.8–5.6)
Mean Plasma Glucose: 88.19 mg/dL

## 2021-11-28 LAB — RESP PANEL BY RT-PCR (FLU A&B, COVID) ARPGX2
Influenza A by PCR: NEGATIVE
Influenza B by PCR: NEGATIVE
SARS Coronavirus 2 by RT PCR: NEGATIVE

## 2021-11-28 LAB — ETHANOL: Alcohol, Ethyl (B): <10 mg/dL (ref <10)

## 2021-11-28 LAB — POC SARS CORONAVIRUS 2 AG -  ED: SARS Coronavirus 2 Ag: NEGATIVE

## 2021-11-28 MED ORDER — DULOXETINE HCL 60 MG PO CPEP
60.0000 mg | ORAL_CAPSULE | Freq: Every day | ORAL | Status: DC
  Administered 2021-11-28: 60 mg via ORAL
  Filled 2021-11-28: qty 1

## 2021-11-28 MED ORDER — BREXPIPRAZOLE 1 MG PO TABS
1.0000 mg | ORAL_TABLET | Freq: Every day | ORAL | Status: DC
  Administered 2021-11-28: 1 mg via ORAL
  Filled 2021-11-28: qty 1

## 2021-11-28 MED ORDER — ALUM & MAG HYDROXIDE-SIMETH 200-200-20 MG/5ML PO SUSP: 30.0000 mL | ORAL | Status: DC | PRN

## 2021-11-28 MED ORDER — BUPROPION HCL ER (XL) 300 MG PO TB24
300.0000 mg | ORAL_TABLET | Freq: Every day | ORAL | Status: DC
  Administered 2021-11-28: 300 mg via ORAL
  Filled 2021-11-28: qty 1

## 2021-11-28 MED ORDER — TRAZODONE HCL 50 MG PO TABS
50.0000 mg | ORAL_TABLET | Freq: Once | ORAL | Status: AC
  Administered 2021-11-28: 50 mg via ORAL
  Filled 2021-11-28: qty 1

## 2021-11-28 MED ORDER — MAGNESIUM HYDROXIDE 400 MG/5ML PO SUSP: 30.0000 mL | Freq: Every day | ORAL | Status: DC | PRN

## 2021-11-28 MED ORDER — ONDANSETRON 4 MG PO TBDP
4.0000 mg | ORAL_TABLET | Freq: Once | ORAL | Status: AC
Start: 2021-11-28 — End: 2021-11-28
  Administered 2021-11-28: 4 mg via ORAL
  Filled 2021-11-28: qty 1

## 2021-11-28 MED ORDER — PROPRANOLOL HCL 20 MG PO TABS
20.0000 mg | ORAL_TABLET | Freq: Two times a day (BID) | ORAL | Status: DC
  Administered 2021-11-28 – 2021-11-29 (×3): 20 mg via ORAL
  Filled 2021-11-28 (×3): qty 1

## 2021-11-28 MED ORDER — BUPROPION HCL ER (XL) 150 MG PO TB24
150.0000 mg | ORAL_TABLET | Freq: Every day | ORAL | Status: DC
  Administered 2021-11-28: 150 mg via ORAL
  Filled 2021-11-28: qty 1

## 2021-11-28 MED ORDER — HYDROXYZINE HCL 25 MG PO TABS
25.0000 mg | ORAL_TABLET | Freq: Three times a day (TID) | ORAL | Status: DC | PRN
  Administered 2021-11-28 – 2021-11-29 (×2): 25 mg via ORAL
  Filled 2021-11-28 (×2): qty 1

## 2021-11-28 MED ORDER — ACETAMINOPHEN 325 MG PO TABS: 650.0000 mg | ORAL_TABLET | Freq: Four times a day (QID) | ORAL | Status: DC | PRN

## 2021-11-28 MED ORDER — VITAMIN B-12 1000 MCG PO TABS
1000.0000 ug | ORAL_TABLET | Freq: Every day | ORAL | Status: DC
  Administered 2021-11-28: 1000 ug via ORAL
  Filled 2021-11-28: qty 1

## 2021-11-28 MED ORDER — ONDANSETRON 4 MG PO TBDP
4.0000 mg | ORAL_TABLET | Freq: Three times a day (TID) | ORAL | Status: DC | PRN
  Administered 2021-11-28: 4 mg via ORAL
  Filled 2021-11-28: qty 1

## 2021-11-28 NOTE — Progress Notes (Signed)
Call placed to [facility] lab to add on TSH . Per lab tech, they have a gold top and will initiate the add-on.

## 2021-11-28 NOTE — ED Notes (Signed)
Pt was observed going in and out of the bathroom pt stated that she was throwing up.

## 2021-11-28 NOTE — ED Notes (Signed)
Pt sleeping at present, no distress noted. Respirations even & unlabored.  Monitoring for safety. 

## 2021-11-28 NOTE — ED Provider Notes (Signed)
Behavioral Health Admission H&P [facility] & OBS)  Date: 11/28/21 Patient Name: Julie Webb MRN: [id number] Chief Complaint:  Chief Complaint  Patient presents with   Depression      Diagnoses:  Final diagnoses:  None    HPI: Julie Webb is a 31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 who presents to the [facility] behavioral health urgent care accompanied by [employer].   Patient states that she was feeling physically overwhelmed this morning due to having nausea, vomiting, and diarrhea since Sunday. She reports that she vomited this morning because she was physically overwhelmed because her son would not stop crying. She states that prior to that she has not vomited for a couple days. She reports that her last episode of diarrhea was last night. She reports that she continues to feel nauseous. She denies respiratory symptoms as well as abdominal pain.  She reports that today her 27-year-old son was screaming this morning and her husband was being a jerk. She states that she briefly had thoughts to kill herself and went downstairs to get the medications but immediately put them back. She states that she had an impulsive moment and 30 minutes of lack of judgment. She states that she would not do anything to hurt herself.  Patient currently denies suicidal ideation and verbally contracts for safety. She is remorseful for impulsively having thoughts of suicide. She denies past and current self-injurious behaviors. She denies homicidal ideations. She denies auditory or visual hallucinations. There is no objective evidence that the patient is currently responding to internal or external stimuli. She denies depressive and anxiety symptoms. She states that she has been doing emotionally well on her medications.    She reports 2 previous suicide attempts at least 5 years ago. She reports 1 psychiatric inpatient hospitalization at least 7 years ago. She denies a family history of mental illnesses or committed suicide.    She currently receives psychiatric treatment at [facility] for medication management. She reports that she is prescribed Wellbutrin, Rexulti, Cymbalta, and Inderal. He is unable to recall the dosage. She states that she receives weekly therapy on Wednesdays via telephone calls from a counseling practice that she found online.  Patient reports that she resides with her husband Sam, and 31-year-old son. She works full-time as an Warden/ranger on weekend night shifts.  She denies drinking alcohol or using illicit drugs.    Patient states that she feels safe to return home and would never attempt suicide. 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 She states that she has a good life and that her husband is very supportive which is why he called the police. She states that she has a good support system and identifies her mother as supportive. I discussed with the patient staying overnight here at the [facility] for continuous overnight observation for safety. Patient is agreeable to the stated plan.    PHQ 2-9:  [employer] Visit from 07/23/2021 in [facility] at [place] that you would be better off dead, or of hurting yourself in some way Several days  PHQ-9 Total Score 16       Flowsheet Row ED from 06/02/2021 in [facility]  [place] No Risk        Total Time spent with patient: 30 minutes  Musculoskeletal  Strength & Muscle Tone: within normal limits Gait & Station: normal Patient leans: N/A  Psychiatric Specialty  Exam  Presentation General Appearance: Appropriate for Environment  Eye Contact:Fair  Speech:Clear and Coherent  Speech Volume:Normal  Handedness:No data recorded  Mood and Affect  Mood:Dysphoric  Affect:Congruent   Thought Process  Thought Processes:Coherent  Descriptions of Associations:Intact  Orientation:Full (Time, Place and  Person)  Thought Content:Logical    Hallucinations:Hallucinations: None  Ideas of Reference:None  Suicidal Thoughts:Suicidal Thoughts: No  Homicidal Thoughts:Homicidal Thoughts: No   Sensorium  Memory:Immediate Fair  Judgment:Fair  Insight:Present   Executive Functions  Concentration:Fair  Attention Span:Fair  [place]   Psychomotor Activity  Psychomotor Activity:Psychomotor Activity: Normal   Assets  Assets:Communication Skills; Desire for Improvement; Financial Resources/Insurance; Housing; Leisure Time; Intimacy; Physical Health; Resilience; Social Support; Transportation; Vocational/Educational; Talents/Skills   Sleep  Sleep:Sleep: Fair   Nutritional Assessment (For OBS and FBC admissions only) Has the patient had a weight loss or gain of 10 pounds or more in the last 3 months?: No Has the patient had a decrease in food intake/or appetite?: Yes Does the patient have dental problems?: No Does the patient have eating habits or behaviors that may be indicators of an eating disorder including binging or inducing vomiting?: No Has the patient recently lost weight without trying?: 0 Has the patient been eating poorly because of a decreased appetite?: 1 Malnutrition Screening Tool Score: 1   Physical Exam Constitutional:      Appearance: Normal appearance.  HENT:     Head: Normocephalic and atraumatic.     Nose: Nose normal.  Eyes:     Conjunctiva/sclera: Conjunctivae normal.  Cardiovascular:     Rate and Rhythm: Normal rate.  Pulmonary:     Effort: Pulmonary effort is normal.  Musculoskeletal:        General: Normal range of motion.     Cervical back: Normal range of motion.  Neurological:     Mental Status: She is alert and oriented to person, place, and time.   Review of Systems  Constitutional: Negative.   HENT: Negative.    Eyes: Negative.   Respiratory: Negative.    Cardiovascular: Negative.    Gastrointestinal:  Positive for diarrhea and nausea.       Diarrhea - last episode last night    Genitourinary: Negative.   Musculoskeletal: Negative.   Skin: Negative.   Neurological: Negative.   Endo/Heme/Allergies: Negative.    Blood pressure 115/80, pulse 85, temperature 98.4 F (36.9 C), temperature source Oral, resp. rate 18, SpO2 99 %, currently breastfeeding. There is no height or weight on file to calculate BMI.  Past Psychiatric History: hx of GAD, MDD and one past psychiatric inpatient admission "at least 7 years ago."  Is the patient at risk to self? Yes  Has the patient been a risk to self in the past 6 months? No .    Has the patient been a risk to self within the distant past? Yes   Is the patient a risk to others? No   Has the patient been a risk to others in the past 6 months? No   Has the patient been a risk to others within the distant past? 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   Migraines    Vaginal Pap smear,  abnormal     Past Surgical History:  Procedure Laterality Date   CHOLECYSTECTOMY Right 09/19/2020   NO PAST SURGERIES      Family History:  Family History  Problem Relation Age of Onset   Hyperlipidemia Mother    Mental illness Mother    Fibroids Mother        uterine   Hypertension Father    Heart attack Father    Heart disease Maternal Grandmother    Hyperlipidemia Maternal Grandmother    Hypertension Maternal Grandmother    Heart disease Maternal Grandfather    Hyperlipidemia Maternal Grandfather    Hypertension Maternal Grandfather    Pancreatic cancer Paternal Grandmother    Mesothelioma Paternal Grandfather     Social History:  Social History   Socioeconomic History   Marital status: Married    Spouse name: Not  on file   Number of children: Not on file   Years of education: Not on file   Highest education level: Not on file  Occupational History   Not on file  Tobacco Use   Smoking status: Never   Smokeless tobacco: Never  Vaping Use   Vaping Use: Never used  Substance and Sexual Activity   Alcohol use: Yes    Comment: occasional   Drug use: No   Sexual activity: Yes    Birth control/protection: I.U.D.  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   Has a son born 11/23/2019   Grew up in [place]   Enjoys reading   Has an [place]      Social Determinants of Radio broadcast assistant Strain: Not on [employer] Insecurity: Not on file  Transportation Needs: Not on file  Physical Activity: Not on file  Stress: Not on file  Social Connections: Not on file  Intimate Partner Violence: Not on file    SDOH:  SDOH Screenings   Alcohol Screen: Not on file  Depression (PHQ2-9): Medium Risk   PHQ-2 Score: 16  Financial Resource Strain: Not on file  Food Insecurity: Not on file  Housing: Not on file  Physical Activity: Not on file  Social Connections: Not on file  Stress: Not on file  Tobacco Use: Low Risk    Smoking Tobacco Use: Never   Smokeless Tobacco Use: Never   Passive Exposure: Not on file  Transportation Needs: Not on file    Last Labs:  Lab on 10/03/2021  Component Date Value Ref Range Status   Hepatitis B-Post 10/03/2021 5 (L)  > OR = 10 mIU/mL Final   Comment: . Patient does not have immunity to hepatitis B virus. . For additional information, please refer to http://education.questdiagn[facility].com/faq/FAQ105 (This link is being provided for informational/ educational purposes only).    Vitamin D 1, 25 (OH)2 Total 10/03/2021 59  18 - 72 pg/mL Final   Vitamin D3 1, 25 (OH)2 10/03/2021 22  pg/mL Final   Vitamin D2 1, 25 (OH)2 10/03/2021 37  pg/mL Final   Comment: Marland Kitchen Vitamin  D3, 1,25(OH)2 indicates both endogenous production and supplementation. Vitamin D2, 1,25(OH)2 is an indicator of exogenous sources, such as diet or supplementation.  Interpretation and therapy are based on measurement of Vitamin D,1,25(OH)2, Total. . . This test was developed and its analytical performance characteristics have been determined by [facility], [place], [place]. It has not been cleared or approved by  the FDA. This assay has been validated pursuant to the CLIA regulations and is used for clinical purposes. .    Vitamin B-12 10/03/2021 >1550 (H)  211 - 911 pg/mL Final  Abstract on 08/07/2021  Component Date Value Ref Range Status   HM Pap smear 01/12/2020 negative   Final  Office Visit on 07/23/2021  Component Date Value Ref Range Status   Hepatitis C Ab 07/23/2021 NON-REACTIVE  NON-REACTIVE Final   SIGNAL TO CUT-OFF 07/23/2021 0.01  <1.00 Final   Comment: . HCV antibody was non-reactive. There is no laboratory  evidence of HCV infection. . In most cases, no further action is required. However, if recent HCV exposure is suspected, a test for HCV RNA (test code 612 [id number]) is suggested. . For additional information please refer to http://education.questdiagn[facility].com/faq/FAQ22v1 (This link is being provided for informational/ educational purposes only.) .    Alcohol Metabolites 07/23/2021 NEGATIVE  <500 ng/mL Final   Amphetamines 07/23/2021 NEGATIVE  <500 ng/mL Final   Benzodiazepines 07/23/2021 NEGATIVE  <100 ng/mL Final   Buprenorphine, Urine 07/23/2021 NEGATIVE  <5 ng/mL Final   Cocaine Metabolite 07/23/2021 NEGATIVE  <150 ng/mL Final   6 Acetylmorphine 07/23/2021 NEGATIVE  <10 ng/mL Final   Marijuana Metabolite 07/23/2021 NEGATIVE  <20 ng/mL Final   MDMA 07/23/2021 NEGATIVE  <500 ng/mL Final   Opiates 07/23/2021 NEGATIVE  <100 ng/mL Final   Oxycodone 07/23/2021 NEGATIVE  <100 ng/mL Final   Creatinine 07/23/2021 66.6  > or = 20.0 mg/dL  Final   pH 07/23/2021 7.3  4.5 - 9.0 Final   Oxidant 07/23/2021 NEGATIVE  <200 mcg/mL Final   Vitamin B-12 07/23/2021 399  211 - 911 pg/mL Final   VITD 07/23/2021 26.79 (L)  30.00 - 100.00 ng/mL Final   Notes and Comments 07/23/2021    Final   Comment: This drug testing is for medical treatment only. Analysis was performed as non-forensic testing and these results should be used only by healthcare providers to render diagnosis or treatment, or to monitor progress of medical conditions. Marland Kitchen LDT Notes: Confirmation tests were developed and their analytical  performance characteristics have been determined by  [facility]. It has not been cleared or approved  by the FDA. This assay has been validated pursuant to  the CLIA regulations and is used for clinical purposes. . . Healthcare Providers needing Interpretation assistance,  please contact us at [phone] ([phone])  M-F, 8am to 10pm EST   Admission on 06/02/2021, Discharged on 06/02/2021  Component Date Value Ref Range Status   WBC 06/02/2021 11.6 (H)  4.0 - 10.5 K/uL Final   RBC 06/02/2021 4.86  3.87 - 5.11 MIL/uL Final   Hemoglobin 06/02/2021 14.7  12.0 - 15.0 g/dL Final   HCT 06/02/2021 43.6  36.0 - 46.0 % Final   MCV 06/02/2021 89.7  80.0 - 100.0 fL Final   MCH 06/02/2021 30.2  26.0 - 34.0 pg Final   MCHC 06/02/2021 33.7  30.0 - 36.0 g/dL Final   RDW 06/02/2021 12.5  11.5 - 15.5 % Final   Platelets 06/02/2021 219  150 - 400 K/uL Final   nRBC 06/02/2021 0.0  0.0 - 0.2 % Final   Neutrophils Relative % 06/02/2021 78  % Final   Neutro Abs 06/02/2021 9.2 (H)  1.7 - 7.7 K/uL Final   Lymphocytes Relative 06/02/2021 13  % Final   Lymphs Abs 06/02/2021 1.5  0.7 - 4.0 K/uL Final   Monocytes Relative 06/02/2021 7  % Final   Monocytes Absolute  06/02/2021 0.8  0.1 - 1.0 K/uL Final   Eosinophils Relative 06/02/2021 1  % Final   Eosinophils Absolute 06/02/2021 0.1  0.0 - 0.5 K/uL Final   Basophils Relative 06/02/2021  1  % Final   Basophils Absolute 06/02/2021 0.1  0.0 - 0.1 K/uL Final   Immature Granulocytes 06/02/2021 0  % Final   Abs Immature Granulocytes 06/02/2021 0.04  0.00 - 0.07 K/uL Final   Performed at [facility], [place], [place] [address]   Sodium 06/02/2021 137  135 - 145 mmol/L Final   Potassium 06/02/2021 4.2  3.5 - 5.1 mmol/L Final   Chloride 06/02/2021 103  98 - 111 mmol/L Final   CO2 06/02/2021 25  22 - 32 mmol/L Final   Glucose, Bld 06/02/2021 93  70 - 99 mg/dL Final   Glucose reference range applies only to samples taken after fasting for at least 8 hours.   BUN 06/02/2021 10  6 - 20 mg/dL Final   Creatinine, Ser 06/02/2021 0.79  0.44 - 1.00 mg/dL Final   Calcium 06/02/2021 9.3  8.9 - 10.3 mg/dL Final   Total Protein 06/02/2021 7.4  6.5 - 8.1 g/dL Final   Albumin 06/02/2021 4.1  3.5 - 5.0 g/dL Final   AST 06/02/2021 17  15 - 41 U/L Final   ALT 06/02/2021 6  0 - 44 U/L Final   Alkaline Phosphatase 06/02/2021 62  38 - 126 U/L Final   Total Bilirubin 06/02/2021 0.5  0.3 - 1.2 mg/dL Final   GFR, Estimated 06/02/2021 >60  >60 mL/min Final   Comment: (NOTE) Calculated using the CKD-EPI Creatinine Equation (2021)    Anion gap 06/02/2021 9  5 - 15 Final   Performed at [facility], [address] [place], [place] [address]   hCG, Beta Chain, Quant, S 06/02/2021 <1  <5 mIU/mL Final   Comment:          GEST. AGE      CONC.  (mIU/mL)   <=1 WEEK        5 - 50     2 WEEKS       50 - 500     3 WEEKS       100 - 10,000     4 WEEKS     1,000 - 30,000     5 WEEKS     3,500 - 115,000   6-8 WEEKS     12,000 - 270,000    12 WEEKS     15,000 - 220,000        FEMALE AND NON-PREGNANT FEMALE:     LESS THAN 5 mIU/mL Performed at [facility], [address] [place], [place] [address]    Color, Urine 06/02/2021 YELLOW  YELLOW Final   APPearance 06/02/2021 HAZY (A)  CLEAR Final   Specific Gravity, Urine 06/02/2021  1.030  1.005 - 1.030 Final   pH 06/02/2021 6.0  5.0 - 8.0 Final   Glucose, UA 06/02/2021 NEGATIVE  NEGATIVE mg/dL Final   Hgb urine dipstick 06/02/2021 MODERATE (A)  NEGATIVE Final   Bilirubin Urine 06/02/2021 NEGATIVE  NEGATIVE Final   Ketones, ur 06/02/2021 NEGATIVE  NEGATIVE mg/dL Final   Protein, ur 06/02/2021 TRACE (A)  NEGATIVE mg/dL Final   Nitrite 06/02/2021 NEGATIVE  NEGATIVE Final   Leukocytes,Ua 06/02/2021 LARGE (A)  NEGATIVE Final   RBC / HPF 06/02/2021 0-5  0 - 5 RBC/hpf Final   WBC, UA 06/02/2021 >50 (H)  0 - 5 WBC/hpf Final   Squamous Epithelial / LPF 06/02/2021 0-5  0 - 5 Final   Mucus 06/02/2021 PRESENT   Final   Performed at [facility], [address] [place], [place] [address]   Yeast Wet Prep HPF POC 06/02/2021 NONE SEEN  NONE SEEN Final   Trich, Wet Prep 06/02/2021 NONE SEEN  NONE SEEN Final   Clue Cells Wet Prep HPF POC 06/02/2021 NONE SEEN  NONE SEEN Final   WBC, Wet Prep HPF POC 06/02/2021 MANY (A)  NONE SEEN Final   Sperm 06/02/2021 NONE SEEN   Final   Performed at [facility], [address], [place], [place] [address]   Chlamydia 06/02/2021 Negative   Final   Neisseria Gonorrhea 06/02/2021 Positive (A)   Final   Comment 06/02/2021 Normal Reference Ranger Chlamydia - Negative   Final   Comment 06/02/2021 Normal Reference Range Neisseria Gonorrhea - Negative   Final    Allergies: Phenergan [promethazine hcl]  PTA Medications: (Not in a hospital admission)   Medical Decision Making   Patient admitted to the GC-continuous assessment for overnight observation for safety.  Labs ordered Lab Orders         Resp Panel by RT-PCR (Flu A&B, Covid) Nasopharyngeal Swab         CBC with Differential/Platelet         Comprehensive metabolic panel         Hemoglobin A1c         Ethanol         Lipid panel         TSH         Pregnancy, urine         POC SARS Coronavirus 2 Ag-ED - Nasal Swab    EKG  Medication doses  verified with the patient and restarted: Rexulti 1 mg po daily nightly Wellbutrin 450 mg po daily nightly Cymbalta 60 mg po nightly Inderal 20 mg po twice daily Vitamin B12 1000 mcg po daily  Recommendations  Based on my evaluation the patient does not appear to have an emergency medical condition.  Marissa Calamity, NP 11/28/21  10:48 AM

## 2021-11-28 NOTE — Progress Notes (Signed)
Pt continues to c/o of N/V. Per pt she vomited X2 since admission. Patrice, NP notified in person. Awaiting orders.

## 2021-11-28 NOTE — BH Assessment (Signed)
Comprehensive Clinical Assessment (CCA) Note  11/28/2021 PANG ROBERS [id number]  Chief Complaint:  Chief Complaint  Patient presents with   Depression   Visit Diagnosis:   F33.2 Major depressive disorder, Recurrent episode, Severe  Flowsheet Row ED from 11/28/2021 in [facility] ED from 06/02/2021 in [facility]  C-SSRS RISK CATEGORY Low Risk No Risk        The patient demonstrates the following risk factors for suicide: Chronic risk factors for suicide include: psychiatric disorder of major depressive disorder and suicidal thoughts . Acute risk factors for suicide include: family or marital conflict. Protective factors for this patient include: positive social support, positive therapeutic relationship, and responsibility to others (children, family). Considering these factors, the overall suicide risk at this point appears to be low. Patient is not appropriate for outpatient follow up.   Disposition Julie Angel NP, recommends overnight observation and continuous assessment  Disposition discussed with Customer service manager.   Julie Webb is a 31 year old female who presents voluntarily to [facility], via Event organiser and unaccompanied.  Pt reports suicide thoughts with a plan to overdose on her medication.  Pt reports one previous suicide attempt seven years. Pt reports that she is not suicidal currently.  Pt denies HI or AVH.  Pt reports she has a history of depression and anxiety and has been feeling physical sick for a week.  Pt reports that she had started to act impulsively, I said somethings to my husband, I should not have said, he was saying terrible things to me, and my child was screaming. Pt denies intentional self-harm.   Pt acknowledged symptoms including crying, anxious, worried, irritable, guilt, fatigue and worthlessness.   Pt reports that she has been sick, and unable to sleep at night, unable to specify the amount of sleep  lost.  Pt reports decreased in appetite.   Pt denies drinking alcohol are using any other substance used. Pt identifies her primary stressor as with negative talk from her husband; also, her crying son.  Pt reports that she has been married five years, lives with her husband and two year old son.  Pt reports that she works full time.   Pt denies family history of mental illness.  Pt reports that her parent was alcoholic.  Pt denies any history of abuse or trauma.  Pt denies any current legal problems.   Pt denies any guns in the home. Pt states she is currently receiving weekly outpatient therapy with [facility]; also, is seeing a psychiatry.  Pt reports that she have not been able to take prescribed medication due to physical sickness.  Pt denies previous inpatient psychiatric hepatizations.  Pt is dressed casual, alert, oriented x 5 with normal speech and restless motor behavior.  Eye contact is good, and Pt is tearful.   Pt mood is depressed, and affect is anxious.  Thought process relevant.  Pt's insight is lacking, and judgment is poor.  There is no indication Pt is currently responding to internal stimuli or experiencing delusional thought content. Pt was cooperative throughout assessment.          CCA Screening, Triage and Referral (STR)  Patient Reported Information How did you hear about [place]? Legal System  What Is the Reason for Your Visit/Call Today? S.I with plan.  How Long Has This Been Causing You Problems? <Week  What Do You Feel Would Help You the Most Today? Treatment for Depression or other mood problem   Have You Recently Had  Any Thoughts About Hurting Yourself? Yes  Are You Planning to Commit Suicide/Harm Yourself At This time? Yes (pt had a plan to take all of her medication today.)   Have you Recently Had Thoughts About [place]? No  Are You Planning to Harm Someone at This Time? No  Explanation: No data recorded  Have You Used Any Alcohol or Drugs  in the Past 24 Hours? No  How Long Ago Did You Use Drugs or Alcohol? No data recorded What Did You Use and How Much? No data recorded  Do You Currently Have a Therapist/Psychiatrist? No data recorded Name of Therapist/Psychiatrist: No data recorded  Have You Been Recently Discharged From Any Office Practice or Programs? No data recorded Explanation of Discharge From Practice/Program: No data recorded    CCA Screening Triage Referral Assessment Type of Contact: No data recorded Telemedicine Service Delivery:   Is this Initial or Reassessment? No data recorded Date Telepsych consult ordered in [facility]:  No data recorded Time Telepsych consult ordered in [facility]:  No data recorded Location of Assessment: No data recorded Provider Location: No data recorded  Collateral Involvement: No data recorded  Does Patient Have a [place]? No data recorded Name and Contact of Legal Guardian: No data recorded If Minor and Not Living with Parent(s), Who has Custody? No data recorded Is CPS involved or ever been involved? No data recorded Is APS involved or ever been involved? No data recorded  Patient Determined To Be At Risk for Harm To Self or Others Based on Review of Patient Reported Information or Presenting Complaint? No data recorded Method: No data recorded Availability of Means: No data recorded Intent: No data recorded Notification Required: No data recorded Additional Information for Danger to Others Potential: No data recorded Additional Comments for Danger to Others Potential: No data recorded Are There Guns or Other Weapons in Your Home? No data recorded Types of Guns/Weapons: No data recorded Are These Weapons Safely Secured?                            No data recorded Who Could Verify You Are Able To Have These Secured: No data recorded Do You Have any Outstanding Charges, Pending Court Dates, Parole/Probation? No data recorded Contacted To Inform of Risk of Harm  To Self or Others: No data recorded   Does Patient Present under Involuntary Commitment? No data recorded IVC Papers Initial File Date: No data recorded  [place] of Residence: No data recorded  Patient Currently Receiving the Following Services: No data recorded  Determination of Need: Emergent (2 hours)   Options For Referral: Inpatient Hospitalization     CCA Biopsychosocial Patient Reported Schizophrenia/Schizoaffective Diagnosis in Past: No   Strengths: Accepting responsibility   Mental Health Symptoms Depression:   Change in energy/activity; Fatigue; Worthlessness; Irritability; Tearfulness; Sleep (too much or little); Difficulty Concentrating   Duration of Depressive symptoms:  Duration of Depressive Symptoms: Less than two weeks   Mania:   None   Anxiety:    Fatigue; Irritability; Restlessness; Sleep; Worrying; Difficulty concentrating   Psychosis:   None   Duration of Psychotic symptoms:    Trauma:   None   Obsessions:   None   Compulsions:   None   Inattention:   None   Hyperactivity/Impulsivity:   None   Oppositional/Defiant Behaviors:   None   Emotional Irregularity:   Chronic feelings of emptiness; Recurrent suicidal behaviors/gestures/threats  Other Mood/Personality Symptoms:   Depressed/irritabe mood    Mental Status Exam Appearance and self-care  Stature:   Average   Weight:   Average weight   Clothing:   Casual   Grooming:   Normal   Cosmetic use:   Age appropriate   Posture/gait:   Normal   Motor activity:   Repetitive; Restless   Sensorium  Attention:   Normal   Concentration:   Anxiety interferes   Orientation:   Object; Person; Place; Situation   Recall/memory:   Normal   Affect and Mood  Affect:   Anxious; Not Congruent; Negative   Mood:   Depressed; Worthless   Relating  Eye contact:   Normal   Facial expression:   Responsive; Sad; Depressed   Attitude toward examiner:    Cooperative   Thought and Language  Speech flow:  Soft; Clear and Coherent   Thought content:   Appropriate to Mood and Circumstances   Preoccupation:   Guilt; Suicide   Hallucinations:   None   Organization:  No data recorded  [employer] of Knowledge:   Good   Intelligence:   Average   Abstraction:   Functional   Judgement:   Poor   Reality Testing:   Distorted   Insight:   Lacking   Decision Making:   Impulsive   Social Functioning  Social Maturity:   Impulsive   Social Judgement:   Victimized   Stress  Stressors:   Family conflict   Coping Ability:   Deficient supports; Overwhelmed   Skill Deficits:   Training and development officer; Self-control   Supports:   Support needed     Religion: Religion/Spirituality How Might This Affect Treatment?: UTA  Leisure/Recreation: Leisure / Recreation Do You Have Hobbies?: Yes Leisure and Hobbies: Caring for her son  Exercise/Diet: Exercise/Diet Do You Exercise?: No Have You Gained or Lost A Significant Amount of Weight in the Past Six Months?: No Do You Follow a Special Diet?: No Do You Have Any Trouble Sleeping?: Yes Explanation of Sleeping Difficulties: Pt reports that she have not been sleeping, "I have been sick; also, having to take care of my son", unable to specify amount of sleep lost.   CCA Employment/Education Employment/Work Situation: Employment / Work Situation Employment Situation: Employed Work Stressors: Architectural technologist has Been Impacted by Current Illness: No Has Patient ever Been in Passenger transport manager?: No  Education: Education Is Patient Currently Attending School?: No Last Grade Completed: 20 Did You Nutritional therapist?: Yes What Type of College Degree Do you Have?: [employer], [employer] Did You Have An Individualized Education Program (IIEP): No Did You Have Any Difficulty At School?: No Patient's Education Has Been Impacted by Current Illness: No   CCA  Family/Childhood History Family and Relationship History: Family history Marital status: Married Number of Years Married: 5 What types of issues is patient dealing with in the relationship?: Negative talk Additional relationship information: UTA Does patient have children?: Yes How many children?: 1 How is patient's relationship with their children?: close  Childhood History:  Childhood History By whom was/is the patient raised?: Both parents Did patient suffer any verbal/emotional/physical/sexual abuse as a child?: No Has patient ever been sexually abused/assaulted/raped as an adolescent or adult?: No Witnessed domestic violence?: No Has patient been affected by domestic violence as an adult?: No  Child/Adolescent Assessment:     CCA Substance Use Alcohol/Drug Use: Alcohol / Drug Use Pain Medications: Denies Prescriptions: Denies Over the Counter: Denies History of alcohol /  drug use?: No history of alcohol / drug abuse                         ASAM's:  Six Dimensions of Multidimensional Assessment  Dimension 1:  Acute Intoxication and/or Withdrawal Potential:      Dimension 2:  Biomedical Conditions and Complications:      Dimension 3:  Emotional, Behavioral, or Cognitive Conditions and Complications:     Dimension 4:  Readiness to Change:     Dimension 5:  Relapse, Continued use, or Continued Problem Potential:     Dimension 6:  Recovery/Living Environment:     ASAM Severity Score:    ASAM Recommended Level of Treatment:     Substance use Disorder (SUD)    Recommendations for Services/Supports/Treatments: Recommendations for Services/Supports/Treatments Recommendations For Services/Supports/Treatments: Facility Based Crisis  Discharge Disposition:    DSM5 Diagnoses: Patient Active Problem List   Diagnosis Date Noted   Benign essential tremor 11/19/2021   Neck pain 09/13/2021   Nevus 08/09/2021   Preventative health care 07/23/2021   Chronic  cough 07/23/2021   Nonallopathic lesion of cervical region 12/08/2019   Nonallopathic lesion of thoracic region 12/08/2019   Encounter for planned induction of labor 11/23/2019   Nonallopathic lesion of rib cage 10/08/2019   Rib pain on right side 10/07/2019   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   Panic disorder 11/01/2018   Generalized anxiety disorder 11/01/2018   Chronic migraine 12/25/2017   Suicidal behavior with attempted self-injury ([place])    Mild recurrent major depression ([place]) 12/08/2016   Tremor 10/05/2015   Loss of weight 07/06/2015   Social anxiety disorder 05/30/2015   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

## 2021-11-28 NOTE — ED Notes (Signed)
Pt received lunch 

## 2021-11-28 NOTE — ED Triage Notes (Signed)
Pt Julie Webb presents to [facility] escorted by [employer]. Pt states that she has been feeling depressed and overwhelmed lately and this morning she expressed feeling suicidal and had a plan to take all of her medication. Pt states that her husband caught her going towards her medication after making this statement and called [employer] for assistance. Pt states that her son was crying uncontrollably and this caused her to feel suicidal. Pt denies HI and states that she does not want to harm anyone at this time but states that she sometimes feels overwhelmed with her child "I start to think about it". Pt did not want to elaborate on this statement. Pt extremely tearful during triage process. Pt states that she has been diagnosed with depression and anxiety in the past and is compliant with her medications. Pt denies AVH. Pt is emergent.

## 2021-11-28 NOTE — ED Notes (Signed)
Pt received dinner. 

## 2021-11-28 NOTE — Progress Notes (Signed)
Pt is admitted to Continuous assessment due to SI with plan to overdose. Pt  currently denies and verbally contracts for safety on the unit. Pt is alert and oriented with a flat affect. Pt is ambulatory and is oriented to staff/unit. Pt was cooperative with labs and skin assessment. Pt denies current HI/AVH. Staff will monitor for pt's safety.

## 2021-11-28 NOTE — ED Notes (Signed)
Patient on phone at this time. Patient calm and cooperative. Patient safe on unit with continued monitoring.

## 2021-11-28 NOTE — ED Notes (Signed)
Pt sleeping at present.  No distress noted.  Respirations even & unlabored.  Monitoring for safety.

## 2021-11-28 NOTE — Progress Notes (Signed)
Pt complained of N/V. PRN Zofran and scheduled med administered with no incident. Will continue to monitor.

## 2021-11-28 NOTE — ED Notes (Signed)
Attempts by [name] RN was made to obtain blood.  Pt states she is " really dehydrated"   Given PO fluids.  Julie Webb

## 2021-11-29 ENCOUNTER — Ambulatory Visit: Payer: No Typology Code available for payment source

## 2021-11-29 ENCOUNTER — Other Ambulatory Visit (HOSPITAL_COMMUNITY): Payer: Self-pay

## 2021-11-29 DIAGNOSIS — R45851 Suicidal ideations: Secondary | ICD-10-CM | POA: Diagnosis not present

## 2021-11-29 MED ORDER — HYDROXYZINE HCL 25 MG PO TABS
25.0000 mg | ORAL_TABLET | Freq: Three times a day (TID) | ORAL | 0 refills | Status: DC | PRN
  Filled 2021-11-29: qty 30, 10d supply, fill #0

## 2021-11-29 MED ORDER — TRAZODONE HCL 50 MG PO TABS
50.0000 mg | ORAL_TABLET | Freq: Every day | ORAL | 0 refills | Status: DC
  Filled 2021-11-29: qty 30, 30d supply, fill #0

## 2021-11-29 NOTE — ED Notes (Signed)
Pt c/o anxiety and was offered mediation. Atarax 25 mg, PRN given.

## 2021-11-29 NOTE — Discharge Instructions (Addendum)
Discharge recommendations:  Patient is to take medications as prescribed. Follow-up with [facility] for psychiatry and [facility] for tele-therapy. Please follow up with your primary care provider for all medical related needs.   Therapy: We recommend that patient participate in individual therapy to address mental health concerns.  Medications: The parent/guardian is to contact a medical professional and/or outpatient provider to address any new side effects that develop. Parent/guardian should update outpatient providers of any new medications and/or medication changes.   Safety:  The patient should abstain from use of illicit substances/drugs and abuse of any medications. If symptoms worsen or do not continue to improve or if the patient becomes actively suicidal or homicidal then it is recommended that the patient return to the closest hospital emergency department, the [facility], or call 911 for further evaluation and treatment. National Suicide Prevention Lifeline 1-800-SUICIDE or (857) 427-3668.  About 988 988 offers 24/7 access to trained crisis counselors who can help people experiencing mental health-related distress. People can call or text 988 or chat 988lifeline.org for themselves or if they are worried about a loved one who may need crisis support.

## 2021-11-29 NOTE — ED Notes (Signed)
Pt ambulatory, alert, and oriented X4 on and off the unit. Education, support, and encouragement provided. Discharge summary/AVS, prescriptions, medications, and follow up appointments reviewed with pt and AVS was given to pt. Suicide prevention resources provided. Pt's belongings in locker #13 returned. Pt denies SI/HI, A/VH, pain, or any concerns at this time. Pt discharged to lobby to be transported home by her father.

## 2021-11-29 NOTE — ED Notes (Signed)
Pt sleeping at present, no distress noted, respirations even & unlabored.  Monitoring for safety. ?

## 2021-11-29 NOTE — ED Notes (Signed)
Pt asleep with even and unlabored respirations. No distress or discomfort noted. Pt remains safe on the unit. Will continue to monitor. 

## 2021-11-29 NOTE — ED Provider Notes (Signed)
FBC/OBS ASAP Discharge Summary  Date and Time: 11/29/2021 11:23 AM  Name: Julie Webb  MRN:  [id number]   Discharge Diagnoses:  Final diagnoses:  Suicidal ideation    Subjective: "I feel pretty good. What I did was impulsive and I was not thinking straight. I was not able to keep my medications down for the past couple days because I was sick and I think that had a lot to do with it." Patient states that she started her medications back last night and was able to keep them down. Patient states that she would never do anything to hurt herself. She states that she has a good life and loves her husband, child and job as a Restaurant manager, fast food. She states that she will discuss with her therapist at her next appointment coping skills for her impulsive behavior.  Stay Summary:  Julie Webb is a 31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 who presents to the [facility] behavioral health urgent care accompanied by GPD after endorsing suicidal ideations and attempting to grab medications in the suicide attempt but immediately put the medications back.  Patient was admitted to the [facility] behavioral health continuous assessment for overnight observation for safety. Patient was restarted back on home psychotropics which included Wellbutrin 150 mg by mouth daily, Wellbutrin 300 mg by mouth daily, Cymbalta 60 mg by mouth daily, Rexulti 1 mg by mouth daily and Inderal 20 mg by mouth twice daily. Patient compliant with the prescribed medications without any side effects. Patient has been observed on the unit without any aggressive, disruptive, self-harm or psychotic behaviors.   Patient seen and re-evaluated face-to-face by this provider, chart reviewed and case discussed with Dr. Serafina Mitchell. On evaluation, patient is alert and oriented x4. Her thought process is logical and relevant. She is goal and future oriented. Her speech is clear and coherent. Her mood is euthymic and affect is congruent.  Patient denies suicidal and  homicidal ideations. Patient verbally contracts for safety to return home. Patient denies auditory or visual hallucinations. There is no objective evidence that the patient is currently responding to internal or external stimuli. Patient states that she will schedule a therapy appointment with her therapist Hildred Alamin via tele-health for this evening or first thing on Monday. She states that she will also make an appointment to follow up her psychiatrist at [facility].     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   Migraines    Vaginal Pap smear, abnormal     Past Surgical History:  Procedure Laterality Date   CHOLECYSTECTOMY Right 09/19/2020   NO PAST SURGERIES     Family History:  Family History  Problem Relation Age of Onset   Hyperlipidemia Mother    Mental illness Mother    Fibroids Mother        uterine   Hypertension Father    Heart attack Father    Heart disease Maternal Grandmother    Hyperlipidemia Maternal Grandmother    Hypertension Maternal Grandmother    Heart disease Maternal Grandfather    Hyperlipidemia Maternal Grandfather    Hypertension Maternal Grandfather  Pancreatic cancer Paternal Grandmother    Mesothelioma Paternal Grandfather    Family Psychiatric History: No hx reported  Social History:  Social History   Substance and Sexual Activity  Alcohol Use Yes   Comment: occasional     Social History   Substance and Sexual Activity  Drug Use No    Social History   Socioeconomic History   Marital status: Married    Spouse name: Not on file   Number of children: Not on file   Years of  education: Not on file   Highest education level: Not on file  Occupational History   Not on file  Tobacco Use   Smoking status: Never   Smokeless tobacco: Never  Vaping Use   Vaping Use: Never used  Substance and Sexual Activity   Alcohol use: Yes    Comment: occasional   Drug use: No   Sexual activity: Yes    Birth control/protection: I.U.D.  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   Has a son born 11/23/2019   Grew up in [place]   Enjoys reading   Has an [place]      Social Determinants of Radio broadcast assistant Strain: Not on file  Food Insecurity: Not on file  Transportation Needs: Not on file  Physical Activity: Not on file  Stress: Not on file  Social Connections: Not on file   SDOH:  SDOH Screenings   Alcohol Screen: Not on file  Depression (PHQ2-9): Medium Risk   PHQ-2 Score: 16  Financial Resource Strain: Not on file  Food Insecurity: Not on file  Housing: Not on file  Physical Activity: Not on file  Social Connections: Not on file  Stress: Not on file  Tobacco Use: Low Risk    Smoking Tobacco Use: Never   Smokeless Tobacco Use: Never   Passive Exposure: Not on file  Transportation Needs: Not on file    Tobacco Cessation:  N/A, patient does not currently use tobacco products  Current Medications:  Current Facility-Administered Medications  Medication Dose Route Frequency Provider Last Rate Last Admin   acetaminophen (TYLENOL) tablet 650 mg  650 mg Oral Q6H PRN Vogan, Jasmond River L, NP       alum & mag hydroxide-simeth (MAALOX/MYLANTA) 200-200-20 MG/5ML suspension 30 mL  30 mL Oral Q4H PRN Pustejovsky, Koreen Lizaola L, NP       brexpiprazole (REXULTI) tablet 1 mg  1 mg Oral QHS Zhao, Mareli Antunes L, NP   1 mg at 11/28/21 2121   buPROPion (WELLBUTRIN XL) 24 hr tablet 150 mg  150 mg Oral QHS Mayon, Blaze Nylund L, NP   150 mg at 11/28/21 2122   buPROPion (WELLBUTRIN XL) 24 hr  tablet 300 mg  300 mg Oral QHS Cosens, Daymond Cordts L, NP   300 mg at 11/28/21 2122   DULoxetine (CYMBALTA) DR capsule 60 mg  60 mg Oral QHS Orr, Johnye Kist L, NP   60 mg at 11/28/21 2122   hydrOXYzine (ATARAX) tablet 25 mg  25 mg Oral TID PRN Gleed, Leni Pankonin L, NP   25 mg at 11/29/21 1022   magnesium hydroxide (MILK OF MAGNESIA) suspension 30 mL  30 mL Oral Daily PRN Hewins, Aniyla Harling L, NP       ondansetron (ZOFRAN-ODT) disintegrating tablet 4 mg  4 mg Oral Q8H PRN Saunders, Madisin Hasan L, NP   4 mg at 11/28/21  1308   propranolol (INDERAL) tablet 20 mg  20 mg Oral BID Skalsky, Jenna Ardoin L, NP   20 mg at 11/29/21 1022   vitamin B-12 (CYANOCOBALAMIN) tablet 1,000 mcg  1,000 mcg Oral QHS Hipwell, Aracelie Addis L, NP   1,000 mcg at 11/28/21 2123   Current Outpatient Medications  Medication Sig Dispense Refill   Armodafinil 250 MG tablet Take 1 tablet (250 mg total) by mouth daily. 30 tablet 2   brexpiprazole (REXULTI) 1 MG TABS tablet Take 1 tablet (1 mg total) by mouth daily. 90 tablet 1   buPROPion (WELLBUTRIN XL) 150 MG 24 hr tablet Take 1 tablet (150 mg total) by mouth daily. (Patient taking differently: Take 150 mg by mouth daily. Take with 300mg  tablet for 450mg  dose.) 90 tablet 1   buPROPion (WELLBUTRIN XL) 300 MG 24 hr tablet Take 1 tablet (300 mg total) by mouth daily after breakfast (Patient taking differently: Take 300 mg by mouth daily. Take with 150mg  tablet for 450mg  dose.) 90 tablet 1   DULoxetine (CYMBALTA) 60 MG capsule Take 1 capsule (60 mg total) by mouth daily after breakfast. 90 capsule 1   propranolol (INDERAL) 20 MG tablet Take 1 tablet (20 mg total) by mouth 2 (two) times daily. 60 tablet 1   traZODone (DESYREL) 50 MG tablet Take 1 tablet (50 mg total) by mouth at bedtime. 30 tablet 0   vitamin B-12 (CYANOCOBALAMIN) 1000 MCG tablet Take 1 tablet (1,000 mcg total) by mouth daily.     hydrOXYzine (ATARAX) 25 MG tablet Take 1 tablet (25 mg total) by mouth 3 (three) times daily as needed for anxiety. 30 tablet  0    PTA Medications: (Not in a hospital admission)   Musculoskeletal  Strength & Muscle Tone: within normal limits Gait & Station: normal Patient leans: N/A  Psychiatric Specialty Exam  Presentation  General Appearance: Appropriate for Environment  Eye Contact:Fair  Speech:Clear and Coherent  Speech Volume:Normal  Handedness:No data recorded  Mood and Affect  Mood:Euthymic  Affect:Congruent   Thought Process  Thought Processes:Coherent; Goal Directed; Linear  Descriptions of Associations:Intact  Orientation:Full (Time, Place and Person)  Thought Content:Logical  Diagnosis of Schizophrenia or Schizoaffective disorder in past: No    Hallucinations:Hallucinations: None  Ideas of Reference:None  Suicidal Thoughts:Suicidal Thoughts: No  Homicidal Thoughts:Homicidal Thoughts: No   Sensorium  Memory:Immediate Fair; Remote Fair; Recent Fair  Judgment:Fair  Insight:Fair   Executive Functions  Concentration:Fair  Attention Span:Fair  [place]   Psychomotor Activity  Psychomotor Activity:Psychomotor Activity: Normal   Assets  Assets:Communication Skills; Desire for Improvement; Financial Resources/Insurance; Intimacy; Housing; Leisure Time; Physical Health; Resilience; Social Support; Talents/Skills; Transportation; Vocational/Educational   Sleep  Sleep:Sleep: Fair   Nutritional Assessment (For OBS and FBC admissions only) Has the patient had a weight loss or gain of 10 pounds or more in the last 3 months?: No Has the patient had a decrease in food intake/or appetite?: Yes Does the patient have dental problems?: No Does the patient have eating habits or behaviors that may be indicators of an eating disorder including binging or inducing vomiting?: No Has the patient recently lost weight without trying?: 0 Has the patient been eating poorly because of a decreased appetite?: 1 Malnutrition Screening  Tool Score: 1    Physical Exam  Physical Exam Constitutional:      Appearance: Normal appearance.  HENT:     Head: Normocephalic and atraumatic.     Nose: Nose normal.  Eyes:  Conjunctiva/sclera: Conjunctivae normal.  Cardiovascular:     Rate and Rhythm: Normal rate.  Pulmonary:     Effort: Pulmonary effort is normal.  Musculoskeletal:        General: Normal range of motion.     Cervical back: Normal range of motion.  Neurological:     Mental Status: She is alert and oriented to person, place, and time.   Review of Systems  Constitutional: Negative.   HENT: Negative.    Eyes: Negative.   Respiratory: Negative.    Cardiovascular: Negative.   Gastrointestinal: Negative.   Genitourinary: Negative.   Musculoskeletal: Negative.   Skin: Negative.   Neurological: Negative.   Endo/Heme/Allergies: Negative.   Blood pressure 101/69, pulse 86, temperature 98 F (36.7 C), resp. rate 18, SpO2 98 %, currently breastfeeding. There is no height or weight on file to calculate BMI.  Demographic Factors:  Caucasian  Loss Factors: NA  Historical Factors: Impulsivity  Risk Reduction Factors:   Responsible for children under 59 years of age, Sense of responsibility to family, Employed, Living with another person, especially a relative, Positive social support, and Positive coping skills or problem solving skills  Continued Clinical Symptoms:  Previous Psychiatric Diagnoses and Treatments  Cognitive Features That Contribute To Risk:  None    Suicide Risk:  Minimal: No identifiable suicidal ideation.  Patients presenting with no risk factors but with morbid ruminations; may be classified as minimal risk based on the severity of the depressive symptoms  Plan Of Care/Follow-up recommendations:  Activity:  as tolerated   Disposition: discharge to home.   Kuehl, Toi Stelly L, NP 11/29/2021, 11:23 AM

## 2021-12-05 ENCOUNTER — Ambulatory Visit: Payer: No Typology Code available for payment source

## 2021-12-10 ENCOUNTER — Other Ambulatory Visit (HOSPITAL_COMMUNITY): Payer: Self-pay

## 2022-01-03 ENCOUNTER — Telehealth: Payer: Self-pay | Admitting: Family

## 2022-01-03 ENCOUNTER — Other Ambulatory Visit (HOSPITAL_COMMUNITY): Payer: Self-pay

## 2022-01-03 NOTE — Telephone Encounter (Signed)
Per Melissa, patient should wait one month after her second injection to check for immunity.  ?

## 2022-01-03 NOTE — Telephone Encounter (Signed)
She should wait longer- it will likely take additional time to show full immunity. ?

## 2022-01-03 NOTE — Telephone Encounter (Signed)
Pt requesting labs for hep b and has to show immunity within results  ? ? ?Pt is schedule for nv on thurs 3/23 @ 2  ?Please advise  ?

## 2022-01-08 ENCOUNTER — Other Ambulatory Visit (HOSPITAL_COMMUNITY): Payer: Self-pay

## 2022-01-08 ENCOUNTER — Telehealth: Payer: Self-pay | Admitting: Family

## 2022-01-08 NOTE — Telephone Encounter (Signed)
Patient advised ok to have second heplisav anytime after 11-29-21 ?

## 2022-01-08 NOTE — Telephone Encounter (Signed)
Pt would like to know if she is okay to wait and get her 2nd hep b shot in May when she has a follow up with Melissa or if it needs to be scheduled sooner. Please advise.  ?

## 2022-01-09 ENCOUNTER — Ambulatory Visit: Payer: No Typology Code available for payment source

## 2022-01-16 ENCOUNTER — Other Ambulatory Visit (HOSPITAL_COMMUNITY): Payer: Self-pay

## 2022-01-16 ENCOUNTER — Ambulatory Visit (INDEPENDENT_AMBULATORY_CARE_PROVIDER_SITE_OTHER): Payer: No Typology Code available for payment source | Admitting: Adult Health

## 2022-01-16 ENCOUNTER — Encounter: Payer: Self-pay | Admitting: Adult Health

## 2022-01-16 DIAGNOSIS — F330 Major depressive disorder, recurrent, mild: Secondary | ICD-10-CM | POA: Diagnosis not present

## 2022-01-16 DIAGNOSIS — G4701 Insomnia due to medical condition: Secondary | ICD-10-CM

## 2022-01-16 DIAGNOSIS — F411 Generalized anxiety disorder: Secondary | ICD-10-CM | POA: Diagnosis not present

## 2022-01-16 DIAGNOSIS — F410 Panic disorder [episodic paroxysmal anxiety] without agoraphobia: Secondary | ICD-10-CM

## 2022-01-16 DIAGNOSIS — G4726 Circadian rhythm sleep disorder, shift work type: Secondary | ICD-10-CM | POA: Diagnosis not present

## 2022-01-16 DIAGNOSIS — F4010 Social phobia, unspecified: Secondary | ICD-10-CM

## 2022-01-16 MED ORDER — BREXPIPRAZOLE 1 MG PO TABS
1.0000 mg | ORAL_TABLET | Freq: Every day | ORAL | 1 refills | Status: DC
Start: 2022-01-16 — End: 2022-07-18
  Filled 2022-01-16 – 2022-05-02 (×2): qty 90, 90d supply, fill #0

## 2022-01-16 MED ORDER — BUPROPION HCL ER (XL) 150 MG PO TB24
150.0000 mg | ORAL_TABLET | Freq: Every day | ORAL | 1 refills | Status: DC
  Filled 2022-01-16: qty 90, 90d supply, fill #0
  Filled 2022-05-06: qty 90, 90d supply, fill #1

## 2022-01-16 MED ORDER — BUPROPION HCL ER (XL) 300 MG PO TB24
300.0000 mg | ORAL_TABLET | Freq: Every day | ORAL | 1 refills | Status: DC
  Filled 2022-01-16: qty 90, 90d supply, fill #0
  Filled 2022-05-06 (×2): qty 90, 90d supply, fill #1

## 2022-01-16 MED ORDER — DULOXETINE HCL 60 MG PO CPEP
60.0000 mg | ORAL_CAPSULE | Freq: Every day | ORAL | 1 refills | Status: DC
  Filled 2022-01-16: qty 90, 90d supply, fill #0
  Filled 2022-06-13: qty 90, 90d supply, fill #1

## 2022-01-16 MED ORDER — ARMODAFINIL 250 MG PO TABS
250.0000 mg | ORAL_TABLET | Freq: Every day | ORAL | 2 refills | Status: DC
  Filled 2022-01-16: qty 30, 30d supply, fill #0
  Filled 2022-05-20: qty 30, 30d supply, fill #1

## 2022-01-16 MED ORDER — TRAZODONE HCL 50 MG PO TABS
50.0000 mg | ORAL_TABLET | Freq: Every day | ORAL | 5 refills | Status: DC
  Filled 2022-01-16: qty 30, 30d supply, fill #0

## 2022-01-16 NOTE — Progress Notes (Signed)
Julie Webb ?[id number] ?April 30, 1991 ?31 y.o. ? ?Subjective:  ? ?Patient ID:  Julie Webb is a 31 y.o. (DOB April 27, 1991) female. ? ?Chief Complaint: No chief complaint on file. ? ? ?HPI ?Julie Webb presents to the office today for follow-up of SAD, Panic disorder, GAD, MDD. ? ?Describes mood today as "ok". Pleasant. Tearful. Mood symptoms - reports increased anxiety and irritability at times. Reports depression - better some days than others. Reports mood fluctuations. Reports an overnight stay in the behavioral health outpatient after having some suicidal thoughts. Had been sick and unable to her medications for a week because she couldn't keep them down. Has restarted the medications and feels they are helpful. Was also given Trazadone and Hydroxyzine to help with sleep. Stating "I am feeling better". Feels like medications are helpful. Family doing well. Stable interest and motivation. Taking medications as prescribed.  ?Energy levels vary. Active, does not have a regular exercise routine.  ?Enjoys some usual interests and activities. Married. Lives with husband and 49 year-old son. Spending time with family. ?Appetite adequate. Weight gain - 140 pounds. ?Sleeps well most nights. Averages 6 hours. Taking naps. ?Focus and concentration stable. Completing tasks. Managing aspects of household. 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  ?Denies SI or HI.  ?Denies AH or VH. ? ?Reports migraines and tremors - seeing a neurologist tomorrow. ? ? ?AIMS   ? ?Flowsheet Row Admission (Discharged) from 12/08/2016 in [facility]  ?AIMS Total Score 0  ? ?  ? ?AUDIT   ? ?Flowsheet Row Admission (Discharged) from 12/08/2016 in [facility]  ?Alcohol Use Disorder Identification Test Final Score (AUDIT) 1  ? ?  ? ?GAD-7   ? ?[place] Office Visit from 07/23/2021 in [facility]  ?Total GAD-7 Score 16  ? ?  ? ?PHQ2-9    ? ?[place] Office Visit from 07/23/2021 in [facility] Visit from 11/12/2017 in [facility]  ?PHQ-2 Total Score 4 0  ?PHQ-9 Total Score 16 --  ? ?  ? ?[place] [facility] ED from 11/28/2021 in [facility] ED from 06/02/2021 in [place] [facility]  ?C-SSRS RISK CATEGORY No Risk No Risk  ? ?  ?  ? ?Review of Systems:  ?Review of Systems  ?Musculoskeletal:  Negative for gait problem.  ?Neurological:  Negative for tremors.  ?Psychiatric/Behavioral:    ?     Please refer to HPI  ? ?Medications: I have reviewed the patient's current medications. ? ?Current Outpatient Medications  ?Medication Sig Dispense Refill  ? Armodafinil 250 MG tablet Take 1 tablet (250 mg total) by mouth daily. 30 tablet 2  ? brexpiprazole (REXULTI) 1 MG TABS tablet Take 1 tablet (1 mg total) by mouth daily. 90 tablet 1  ? buPROPion (WELLBUTRIN XL) 150 MG 24 hr tablet Take 1 tablet (150 mg total) by mouth daily. 90 tablet 1  ? buPROPion (WELLBUTRIN XL) 300 MG 24 hr tablet Take 1 tablet (300 mg total) by mouth daily after breakfast 90 tablet 1  ? DULoxetine (CYMBALTA) 60 MG capsule Take 1 capsule (60 mg total) by mouth daily after breakfast. 90 capsule 1  ? hydrOXYzine (ATARAX) 25 MG tablet Take 1 tablet (25 mg total) by mouth 3 (three) times daily as needed for anxiety. 30 tablet 0  ? propranolol (INDERAL) 20 MG  tablet Take 1 tablet (20 mg total) by mouth 2 (two) times daily. 60 tablet 1  ? traZODone (DESYREL) 50 MG tablet Take 1 tablet (50 mg total) by mouth at bedtime. 30 tablet 5  ? vitamin B-12 (CYANOCOBALAMIN) 1000 MCG tablet Take 1 tablet (1,000 mcg total) by mouth daily.    ? ?No current facility-administered medications for this visit.  ? ? ?Medication Side Effects: None ? ?Allergies:  ?Allergies  ?Allergen Reactions  ? Phenergan [Promethazine Hcl] Other (See Comments)  ?  Restlessness w/ IV Phenergen.  Probable mild EPS. Tolerates if taken w/ Benadryl.   ? ? ?Past Medical History:  ?Diagnosis Date  ? Anxiety   ? Depression   ? Family history of adverse reaction to anesthesia   ? mother- N/V   ? Glomerulonephritis, poststreptococcal   ? age 14  ? 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 Migraines   ? Vaginal Pap smear, abnormal   ? ? ?Past Medical History, Surgical history, Social history, and Family history were reviewed and updated as appropriate.  ? ?Please see review of systems for further details on the patient's review from today.  ? ?Objective:  ? ?Physical Exam:  ?There were no vitals taken for this visit. ? ?Physical Exam ?Constitutional:   ?   General: She is not in acute distress. ?Musculoskeletal:     ?   General: No deformity.  ?Neurological:  ?   Mental Status: She is alert and oriented to person, place, and time.  ?   Coordination: Coordination normal.  ?Psychiatric:     ?   Attention and Perception: Attention and perception normal. She does not perceive auditory or visual hallucinations.     ?   Mood and Affect: Mood normal. Mood is not anxious or depressed. Affect is not labile, blunt, angry or inappropriate.     ?   Speech: Speech normal.     ?   Behavior: Behavior normal.     ?   Thought Content: Thought content normal. Thought content is not paranoid or delusional. Thought content does not include homicidal or suicidal ideation. Thought content does not include homicidal or suicidal plan.     ?   Cognition and Memory: Cognition and memory normal.     ?   Judgment: Judgment normal.  ?   Comments: Insight intact  ? ? ?Lab Review:  ?   ?Component Value Date/Time  ? NA 140 11/28/2021 1330  ? K 3.9 11/28/2021 1330  ? CL 106 11/28/2021 1330  ? CO2 28 11/28/2021 1330  ? GLUCOSE 94 11/28/2021 1330  ? BUN 7 11/28/2021 1330  ? CREATININE 0.75 11/28/2021 1330  ? CALCIUM 9.4 11/28/2021 1330  ? PROT 7.0 11/28/2021 1330  ? ALBUMIN 3.8 11/28/2021 1330  ? AST 18 11/28/2021 1330  ? ALT 18 11/28/2021 1330  ?  ALKPHOS 55 11/28/2021 1330  ? BILITOT 0.4 11/28/2021 1330  ? GFRNONAA >60 11/28/2021 1330  ? GFRAA >60 06/05/2019 1928  ? ? ?   ?Component Value Date/Time  ? WBC 5.3 11/28/2021 1330  ? RBC 4.90 11/28/2021 1330  ? HGB 15.0 11/28/2021 1330  ? HCT 43.9 11/28/2021 1330  ? PLT 254 11/28/2021 1330  ? MCV 89.6 11/28/2021 1330  ? MCH 30.6 11/28/2021 1330  ? MCHC 34.2 11/28/2021 1330  ? RDW 12.4 11/28/2021 1330  ? LYMPHSABS 1.6 11/28/2021 1330  ? MONOABS 0.3 11/28/2021 1330  ? EOSABS 0.1 11/28/2021 1330  ? BASOSABS 0.0 11/28/2021 1330  ? ? ?  No results found for: POCLITH, LITHIUM  ? ?No results found for: PHENYTOIN, PHENOBARB, VALPROATE, CBMZ  ? ?.res ?Assessment: Plan:   ? ?Plan: ? ?PDMP reviewed ? ?Cymbalta '60mg'$  daily ?Wellbutrin XL '450mg'$  daily - denies seizure history - move to morning ?Rexulti 0.'5mg'$  daily  ?Provigil '250mg'$  daily for shift work disorder ?Add Trazadone '50mg'$  daily ?Add Hydroxyzine '25mg'$  daily as needed - no script needed today ? ?98/63 ? ?RTC 3 months ? ?Patient advised to contact office with any questions, adverse effects, or acute worsening in signs and symptoms. ? ?Discussed potential metabolic side effects associated with atypical antipsychotics, as well as potential risk for movement side effects. Advised pt to contact office if movement side effects occur.  ? ?Diagnoses and all orders for this visit: ? ?Shift work sleep disorder ?-     Armodafinil 250 MG tablet; Take 1 tablet (250 mg total) by mouth daily. ? ?Generalized anxiety disorder ?-     brexpiprazole (REXULTI) 1 MG TABS tablet; Take 1 tablet (1 mg total) by mouth daily. ?-     buPROPion (WELLBUTRIN XL) 150 MG 24 hr tablet; Take 1 tablet (150 mg total) by mouth daily. ?-     buPROPion (WELLBUTRIN XL) 300 MG 24 hr tablet; Take 1 tablet (300 mg total) by mouth daily after breakfast ?-     DULoxetine (CYMBALTA) 60 MG capsule; Take 1 capsule (60 mg total) by mouth daily after breakfast. ? ?Mild recurrent major depression (HCC) ?-     brexpiprazole  (REXULTI) 1 MG TABS tablet; Take 1 tablet (1 mg total) by mouth daily. ?-     buPROPion (WELLBUTRIN XL) 150 MG 24 hr tablet; Take 1 tablet (150 mg total) by mouth daily. ?-     buPROPion (WELLBUTRIN XL) 300 MG 24

## 2022-01-17 ENCOUNTER — Other Ambulatory Visit (HOSPITAL_COMMUNITY): Payer: Self-pay

## 2022-01-17 ENCOUNTER — Ambulatory Visit: Payer: No Typology Code available for payment source | Admitting: Neurology

## 2022-01-17 ENCOUNTER — Encounter: Payer: Self-pay | Admitting: Neurology

## 2022-01-17 VITALS — BP 122/84 | HR 81 | Ht 64.0 in | Wt 155.8 lb

## 2022-01-17 DIAGNOSIS — G250 Essential tremor: Secondary | ICD-10-CM

## 2022-01-17 DIAGNOSIS — G43709 Chronic migraine without aura, not intractable, without status migrainosus: Secondary | ICD-10-CM | POA: Diagnosis not present

## 2022-01-17 MED ORDER — SUMATRIPTAN SUCCINATE 50 MG PO TABS
50.0000 mg | ORAL_TABLET | ORAL | 6 refills | Status: DC
  Filled 2022-01-17: qty 12, 30d supply, fill #0
  Filled 2022-10-07: qty 12, 30d supply, fill #1
  Filled 2022-12-30: qty 12, 30d supply, fill #2

## 2022-01-17 MED ORDER — PROPRANOLOL HCL ER 80 MG PO CP24
80.0000 mg | ORAL_CAPSULE | Freq: Every day | ORAL | 11 refills | Status: DC
  Filled 2022-01-17: qty 30, 30d supply, fill #0

## 2022-01-17 NOTE — Progress Notes (Signed)
? ?Chief Complaint  ?Patient presents with  ? New Patient (Initial Visit)  ?  RM. Internal referral for tremor/migraines from Debbrah Alar, NP. Has had tremors since she was a child. Tremors located in both hands. Mother/brother have tremors. Great grandmother also had tremors.  ?Getting about 1-2 migraines per week. Sensitive to light/sound. Takes fioricet. Makes her very sleepy. 80 percent gone when she wakes. Has never tried triptan in the past.  ? PCP  ?  Debbrah Alar, NP  ? ? ? ? ?ASSESSMENT AND PLAN ? ?Julie Webb is a 31 y.o. female   ?  ?Essential Tremor: ? strong family history, normal thyroid functional test, mild action tremor ? Start Inderal LA 80 mg daily ? ?Chronic migraine headaches ? Imitrex 50 mg as needed ? ?DIAGNOSTIC DATA (LABS, IMAGING, TESTING) ?- I reviewed patient records, labs, notes, testing and imaging myself where available. ? ? ?MEDICAL HISTORY: ? ?Julie Webb is a 31 year old female, seen in request by her primary care nurse practitioner Debbrah Alar for evaluation of essential tremor, chronic migraine, she is accompanied by her husband at today's visit January 17, 2022 ? ?I reviewed and summarized the referring note. PMHX ?Depression, anxiety,  on polypharmacy  ? 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 ?Only observe mild bilateral hand symmetric tremor drawing spiral circle ? ?She also had long history of chronic migraine headaches, bilateral frontal pressure headaches, with light noise sensitivity, nauseous, Excedrin Migraine works, sleep is very helpful ? ?Laboratory evaluation in February 2023 showed normal TSH, CMP CBC ? ? ? ?PHYSICAL EXAM: ?   ?Vitals:  ? 01/17/22 0848  ?BP: 122/84  ?Pulse: 81  ?Weight: 155 lb 12.8 oz (70.7 kg)  ?Height: '5\' 4"'$  (1.626 m)  ? ?Not recorded ?  ? ? ?Body mass index is 26.74 kg/m?. ? ?PHYSICAL EXAMNIATION: ? ?Gen: NAD, conversant, well nourised, well groomed                     ?Cardiovascular: Regular rate rhythm, no peripheral edema, warm, nontender. ?Eyes: Conjunctivae clear without exudates or hemorrhage ?Neck: Supple, no carotid bruits. ?Pulmonary: Clear to auscultation bilaterally  ? ?NEUROLOGICAL EXAM: ? ?MENTAL STATUS: ?Speech: ?   Speech is normal; fluent and spontaneous with normal comprehension.  ?Cognition: ?    Orientation to time, place and person ?    Normal recent and remote memory ?    Normal Attention span and concentration ?    Normal Language, naming, repeating,spontaneous speech ?    Fund of knowledge ?  ?CRANIAL NERVES: ?CN II: Visual fields are full to confrontation. Pupils are round equal and briskly reactive to light. ?CN III, IV, VI: extraocular movement are normal. No ptosis. ?CN V: Facial sensation is intact to light touch ?CN VII: Face is symmetric with normal eye closure  ?CN VIII: Hearing is normal to causal conversation. ?CN IX, X: Phonation is normal. ?CN XI: Head turning and shoulder shrug are intact ? ?MOTOR: Mild bilateral hands posturing tremor, normal strength, no rigidity, slight tremor while drawing spiral circle ? ?REFLEXES: ?Reflexes are 2+ and symmetric at the biceps, triceps, knees, and ankles. Plantar responses are flexor. ? ?SENSORY: ?Intact  to light touch, pinprick and vibratory sensation are intact in fingers and toes. ? ?COORDINATION: ?There is no trunk or limb dysmetria noted. ? ?GAIT/STANCE: ?Posture is normal. Gait is steady with normal steps, base, arm swing, and turning. Heel and toe walking are normal. Tandem gait is normal.  ?Romberg is absent. ? ?REVIEW OF SYSTEMS:  ?Full 14 system review of systems performed and notable only for as above ?All other review of systems  were negative. ? ? ?ALLERGIES: ?Allergies  ?Allergen Reactions  ? Phenergan [Promethazine Hcl] Other (See Comments)  ?  Restlessness w/ IV Phenergen. Probable mild EPS. Tolerates if taken w/ Benadryl.   ? ? ?HOME MEDICATIONS: ?Current Outpatient Medications  ?Medication Sig Dispense Refill  ? Armodafinil 250 MG tablet Take 1 tablet (250 mg total) by mouth daily. 30 tablet 2  ? brexpiprazole (REXULTI) 1 MG TABS tablet Take 1 tablet (1 mg total) by mouth daily. 90 tablet 1  ? buPROPion (WELLBUTRIN XL) 150 MG 24 hr tablet Take 1 tablet (150 mg total) by mouth daily. 90 tablet 1  ? buPROPion (WELLBUTRIN XL) 300 MG 24 hr tablet Take 1 tablet (300 mg total) by mouth daily after breakfast 90 tablet 1  ? DULoxetine (CYMBALTA) 60 MG capsule Take 1 capsule (60 mg total) by mouth daily after breakfast. 90 capsule 1  ? hydrOXYzine (ATARAX) 25 MG tablet Take 1 tablet (25 mg total) by mouth 3 (three) times daily as needed for anxiety. 30 tablet 0  ? propranolol (INDERAL) 20 MG tablet Take 1 tablet (20 mg total) by mouth 2 (two) times daily. 60 tablet 1  ? traZODone (DESYREL) 50 MG tablet Take 1 tablet (50 mg total) by mouth at bedtime. 30 tablet 5  ? ?No current facility-administered medications for this visit.  ? ? ?PAST MEDICAL HISTORY: ?Past Medical History:  ?Diagnosis Date  ? Anxiety   ? Depression   ? Family history of adverse reaction to anesthesia   ? mother- N/V   ? Glomerulonephritis, poststreptococcal   ? age 52  ? 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 Migraines   ? Vaginal Pap smear, abnormal   ? ? ?PAST SURGICAL HISTORY: ?Past Surgical History:  ?Procedure Laterality Date  ? CHOLECYSTECTOMY Right 09/19/2020  ? ? ?FAMILY HISTORY: ?Family History  ?Problem Relation Age of Onset  ? Hyperlipidemia Mother   ? Mental illness Mother   ? Fibroids Mother   ?     uterine  ? Hypertension Father   ? Heart attack Father   ? Heart disease Maternal Grandmother   ? Hyperlipidemia Maternal Grandmother   ? Hypertension Maternal  Grandmother   ? Heart disease Maternal Grandfather   ? Hyperlipidemia Maternal Grandfather   ? Hypertension Maternal Grandfather   ? Pancreatic cancer Paternal Grandmother   ? Mesothelioma Paternal Grandfather   ? ? ?SOCIAL HISTORY: ?Social History  ? ?Socioeconomic History  ? Marital status: Married  ?  Spouse name: Not on file  ? Number of children: Not on file  ? Years of education: Not on file  ? Highest education level: Not on file  ?Occupational History  ? Not on file  ?Tobacco Use  ? Smoking status: Never  ? Smokeless tobacco: Never  ?Vaping Use  ? Vaping Use: Never used  ?Substance and Sexual Activity  ? Alcohol use: Yes  ?  Comment: occasional  ? Drug use: No  ? Sexual activity: Yes  ?  Birth control/protection: I.U.D.  ?Other Topics Concern  ?  Not on file  ?Social History Narrative  ? Works as a Marine scientist in Art therapist at [employer]  ? Lives with husband  ? Has a son born 11/23/2019  ? Grew up in [place]  ? Enjoys reading  ? Right handed  ? Has an [place] Bulldog  ? 3 cups coffee per day, soda every other day  ?   ? ?Social Determinants of Health  ? ?Financial Resource Strain: Not on file  ?Food Insecurity: Not on file  ?Transportation Needs: Not on file  ?Physical Activity: Not on file  ?Stress: Not on file  ?Social Connections: Not on file  ?Intimate Partner Violence: Not on file  ? ? ? ? ?Marcial Pacas, M.D. Ph.D. ? ?[facility] ?[address], [address] ?[place], [place] [address] ?Ph: [phone]) [phone] ?Fax: [phone] ? ?CC:  Debbrah Alar, NP ?[address] ?[address] ?[place],  [place] [address]  Debbrah Alar, NP   ?

## 2022-02-18 ENCOUNTER — Ambulatory Visit: Payer: No Typology Code available for payment source | Admitting: Family

## 2022-02-28 ENCOUNTER — Ambulatory Visit: Payer: No Typology Code available for payment source | Admitting: Family

## 2022-02-28 NOTE — Progress Notes (Incomplete)
? ?Subjective:  ? ?By signing my name below, I, Carylon Perches, attest that this documentation has been prepared under the direction and in the presence of [place], NP 02/28/2022 ? ? Patient ID: Julie Webb, female    DOB: April 04, 1991, 31 y.o.   MRN: [id number] ? ?No chief complaint on file. ? ? ?HPI ?Patient is in today for an office visit. ? ? ?Health Maintenance Due  ?Topic Date Due  ?? COVID-19 Vaccine (4 - Booster for Pfizer series) 12/26/2020  ? ? ?Past Medical History:  ?Diagnosis Date  ?? Anxiety   ?? Depression   ?? Family history of adverse reaction to anesthesia   ? mother- N/V   ?? Glomerulonephritis, poststreptococcal   ? age 69  ?? 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 Migraines   ?? Vaginal Pap smear, abnormal   ? ? ?Past Surgical History:  ?Procedure Laterality Date  ?? CHOLECYSTECTOMY Right 09/19/2020  ? ? ?Family History  ?Problem Relation Age of Onset  ?? Hyperlipidemia Mother   ?? Mental illness Mother   ?? Fibroids Mother   ?     uterine  ?? Hypertension Father   ?? Heart attack Father   ?? Heart disease Maternal Grandmother   ?? Hyperlipidemia Maternal Grandmother   ?? Hypertension Maternal Grandmother   ?? Heart disease Maternal Grandfather   ?? Hyperlipidemia Maternal Grandfather   ?? Hypertension Maternal Grandfather   ?? Pancreatic cancer Paternal Grandmother   ?? Mesothelioma Paternal Grandfather   ? ? ?Social History  ? ?Socioeconomic History  ?? Marital status: Married  ?  Spouse name: Not on file  ?? Number of children: Not on file  ?? Years of education: Not on file  ?? Highest education level: Not on file  ?Occupational History  ?? Not on file  ?Tobacco Use  ?? Smoking status: Never  ?? Smokeless tobacco: Never  ?Vaping Use  ?? Vaping Use: Never used  ?Substance and Sexual Activity  ?? Alcohol use: Yes  ?  Comment: occasional  ?? Drug use: No  ?? Sexual activity: Yes  ?  Birth control/protection: I.U.D.  ?Other Topics Concern  ?? Not on file  ?Social History  Narrative  ? Works as a Marine scientist in Art therapist at [employer]  ? Lives with husband  ? Has a son born 11/23/2019  ? Grew up in [place]  ? Enjoys reading  ? Right handed  ? Has an [place] Bulldog  ? 3 cups coffee per day, soda every other day  ?   ? ?Social Determinants of Health  ? ?Financial Resource Strain: Not on file  ?Food Insecurity: Not on file  ?Transportation Needs: Not on file  ?Physical Activity: Not on file  ?Stress: Not on file  ?Social Connections: Not on file  ?Intimate Partner Violence: Not on file  ? ? ?Outpatient Medications Prior to Visit  ?Medication Sig Dispense Refill  ?? Armodafinil 250 MG tablet Take 1 tablet (250 mg total) by mouth daily. 30 tablet 2  ?? brexpiprazole (REXULTI) 1 MG TABS tablet Take 1 tablet (1 mg total) by mouth daily. 90 tablet 1  ?? buPROPion (WELLBUTRIN XL) 150 MG 24 hr tablet Take 1 tablet (150 mg total) by mouth daily. 90 tablet 1  ?? buPROPion (WELLBUTRIN XL) 300 MG 24 hr tablet Take 1 tablet (300 mg total) by mouth daily after breakfast 90 tablet 1  ?? DULoxetine (CYMBALTA) 60 MG capsule Take 1 capsule (60 mg total) by mouth daily  after breakfast. 90 capsule 1  ?? hydrOXYzine (ATARAX) 25 MG tablet Take 1 tablet (25 mg total) by mouth 3 (three) times daily as needed for anxiety. 30 tablet 0  ?? propranolol (INDERAL) 20 MG tablet Take 1 tablet (20 mg total) by mouth 2 (two) times daily. 60 tablet 1  ?? propranolol ER (INDERAL LA) 80 MG 24 hr capsule Take 1 capsule (80 mg total) by mouth daily. 30 capsule 11  ?? SUMAtriptan (IMITREX) 50 MG tablet Take 1 tablet (50 mg total) by mouth at the onset of a migraine. Repeat in 2 hours. Max of 2 tablets in 24 hours. 12 tablet 6  ?? traZODone (DESYREL) 50 MG tablet Take 1 tablet (50 mg total) by mouth at bedtime. 30 tablet 5  ? ?No facility-administered medications prior to visit.  ? ? ?Allergies  ?Allergen Reactions  ?? Phenergan [Promethazine Hcl] Other (See Comments)  ?  Restlessness w/ IV Phenergen. Probable mild EPS. Tolerates if taken  w/ Benadryl.   ? ? ?ROS ? ?   ?Objective:  ?  ?Physical Exam ?Constitutional:   ?   General: She is not in acute distress. ?   Appearance: Normal appearance. She is not ill-appearing.  ?HENT:  ?   Head: Normocephalic and atraumatic.  ?   Right Ear: External ear normal.  ?   Left Ear: External ear normal.  ?Eyes:  ?   Extraocular Movements: Extraocular movements intact.  ?   Pupils: Pupils are equal, round, and reactive to light.  ?Cardiovascular:  ?   Rate and Rhythm: Normal rate and regular rhythm.  ?   Heart sounds: Normal heart sounds. No murmur heard. ?  No gallop.  ?Pulmonary:  ?   Effort: Pulmonary effort is normal. No respiratory distress.  ?   Breath sounds: Normal breath sounds. No wheezing or rales.  ?Skin: ?   General: Skin is warm and dry.  ?Neurological:  ?   Mental Status: She is alert and oriented to person, place, and time.  ?Psychiatric:     ?   Mood and Affect: Mood normal.     ?   Behavior: Behavior normal.     ?   Judgment: Judgment normal.  ? ? ?There were no vitals taken for this visit. ?Wt Readings from Last 3 Encounters:  ?01/17/22 155 lb 12.8 oz (70.7 kg)  ?11/19/21 152 lb 12.8 oz (69.3 kg)  ?09/10/21 149 lb (67.6 kg)  ? ? ?   ?Assessment & Plan:  ? ?Problem List Items Addressed This Visit   ?None ? ? ? ? ?No orders of the defined types were placed in this encounter. ? ? ?I, Carylon Perches, personally preformed the services described in this documentation.  All medical record entries made by the scribe were at my direction and in my presence.  I have reviewed the chart and discharge instructions (if applicable) and agree that the record reflects my personal performance and is accurate and complete. 02/28/2022 ? ? ?I,Amber Collins,acting as a Education administrator for [employer], NP.,have documented all relevant documentation on the behalf of Nance Pear, NP,as directed by  Nance Pear, NP while in the presence of Nance Pear, NP. ? ? ? ?[employer] ? ?

## 2022-03-04 ENCOUNTER — Ambulatory Visit: Payer: No Typology Code available for payment source | Admitting: Family

## 2022-03-04 NOTE — Progress Notes (Incomplete)
? ?Subjective:  ? ?By signing my name below, I, Carylon Perches, attest that this documentation has been prepared under the direction and in the presence of Debbrah Alar NP, 03/04/2022   ? ? Patient ID: Julie Webb, female    DOB: 1991/03/14, 31 y.o.   MRN: [id number] ? ?No chief complaint on file. ? ? ?HPI ?Patient is in today for an office visit ? ?Health Maintenance Due  ?Topic Date Due  ? COVID-19 Vaccine (4 - Booster for Pfizer series) 12/26/2020  ? ? ?Past Medical History:  ?Diagnosis Date  ? Anxiety   ? Depression   ? Family history of adverse reaction to anesthesia   ? mother- N/V   ? Glomerulonephritis, poststreptococcal   ? age 65  ? 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 Migraines   ? Vaginal Pap smear, abnormal   ? ? ?Past Surgical History:  ?Procedure Laterality Date  ? CHOLECYSTECTOMY Right 09/19/2020  ? ? ?Family History  ?Problem Relation Age of Onset  ? Hyperlipidemia Mother   ? Mental illness Mother   ? Fibroids Mother   ?     uterine  ? Hypertension Father   ? Heart attack Father   ? Heart disease Maternal Grandmother   ? Hyperlipidemia Maternal Grandmother   ? Hypertension Maternal Grandmother   ? Heart disease Maternal Grandfather   ? Hyperlipidemia Maternal Grandfather   ? Hypertension Maternal Grandfather   ? Pancreatic cancer Paternal Grandmother   ? Mesothelioma Paternal Grandfather   ? ? ?Social History  ? ?Socioeconomic History  ? Marital status: Married  ?  Spouse name: Not on file  ? Number of children: Not on file  ? Years of education: Not on file  ? Highest education level: Not on file  ?Occupational History  ? Not on file  ?Tobacco Use  ? Smoking status: Never  ? Smokeless tobacco: Never  ?Vaping Use  ? Vaping Use: Never used  ?Substance and Sexual Activity  ? Alcohol use: Yes  ?  Comment: occasional  ? Drug use: No  ? Sexual activity: Yes  ?  Birth control/protection: I.U.D.  ?Other Topics Concern  ? Not on file  ?Social History Narrative  ? Works as a Marine scientist in Art therapist at  [employer]  ? Lives with husband  ? Has a son born 11/23/2019  ? Grew up in [place]  ? Enjoys reading  ? Right handed  ? Has an [place] Bulldog  ? 3 cups coffee per day, soda every other day  ?   ? ?Social Determinants of Health  ? ?Financial Resource Strain: Not on file  ?Food Insecurity: Not on file  ?Transportation Needs: Not on file  ?Physical Activity: Not on file  ?Stress: Not on file  ?Social Connections: Not on file  ?Intimate Partner Violence: Not on file  ? ? ?Outpatient Medications Prior to Visit  ?Medication Sig Dispense Refill  ? Armodafinil 250 MG tablet Take 1 tablet (250 mg total) by mouth daily. 30 tablet 2  ? brexpiprazole (REXULTI) 1 MG TABS tablet Take 1 tablet (1 mg total) by mouth daily. 90 tablet 1  ? buPROPion (WELLBUTRIN XL) 150 MG 24 hr tablet Take 1 tablet (150 mg total) by mouth daily. 90 tablet 1  ? buPROPion (WELLBUTRIN XL) 300 MG 24 hr tablet Take 1 tablet (300 mg total) by mouth daily after breakfast 90 tablet 1  ? DULoxetine (CYMBALTA) 60 MG capsule Take 1 capsule (60 mg total) by mouth daily  after breakfast. 90 capsule 1  ? hydrOXYzine (ATARAX) 25 MG tablet Take 1 tablet (25 mg total) by mouth 3 (three) times daily as needed for anxiety. 30 tablet 0  ? propranolol (INDERAL) 20 MG tablet Take 1 tablet (20 mg total) by mouth 2 (two) times daily. 60 tablet 1  ? propranolol ER (INDERAL LA) 80 MG 24 hr capsule Take 1 capsule (80 mg total) by mouth daily. 30 capsule 11  ? SUMAtriptan (IMITREX) 50 MG tablet Take 1 tablet (50 mg total) by mouth at the onset of a migraine. Repeat in 2 hours. Max of 2 tablets in 24 hours. 12 tablet 6  ? traZODone (DESYREL) 50 MG tablet Take 1 tablet (50 mg total) by mouth at bedtime. 30 tablet 5  ? ?No facility-administered medications prior to visit.  ? ? ?Allergies  ?Allergen Reactions  ? Phenergan [Promethazine Hcl] Other (See Comments)  ?  Restlessness w/ IV Phenergen. Probable mild EPS. Tolerates if taken w/ Benadryl.   ? ? ?ROS ? ?   ?Objective:  ?  ?Physical  Exam ?Constitutional:   ?   Appearance: Normal appearance.  ?HENT:  ?   Head: Normocephalic and atraumatic.  ?   Right Ear: External ear normal.  ?   Left Ear: External ear normal.  ?Eyes:  ?   Extraocular Movements: Extraocular movements intact.  ?   Pupils: Pupils are equal, round, and reactive to light.  ?Pulmonary:  ?   Effort: Pulmonary effort is normal. No respiratory distress.  ?   Breath sounds: Normal breath sounds. No wheezing or rales.  ?Neurological:  ?   Mental Status: She is alert.  ? ? ?There were no vitals taken for this visit. ?Wt Readings from Last 3 Encounters:  ?01/17/22 155 lb 12.8 oz (70.7 kg)  ?11/19/21 152 lb 12.8 oz (69.3 kg)  ?09/10/21 149 lb (67.6 kg)  ? ? ?   ?Assessment & Plan:  ? ?Problem List Items Addressed This Visit   ?None ? ? ? ?No orders of the defined types were placed in this encounter. ? ? ?I, Carylon Perches, personally preformed the services described in this documentation.  All medical record entries made by the scribe were at my direction and in my presence.  I have reviewed the chart and discharge instructions (if applicable) and agree that the record reflects my personal performance and is accurate and complete. 03/04/2022 ? ? ?I,Amber Collins,acting as a Education administrator for [employer], NP.,have documented all relevant documentation on the behalf of Nance Pear, NP,as directed by  Nance Pear, NP while in the presence of Nance Pear, NP. ? ? ? ?[employer] ? ?

## 2022-04-18 ENCOUNTER — Encounter: Payer: Self-pay | Admitting: Adult Health

## 2022-04-18 ENCOUNTER — Ambulatory Visit (INDEPENDENT_AMBULATORY_CARE_PROVIDER_SITE_OTHER): Payer: No Typology Code available for payment source | Admitting: Adult Health

## 2022-04-18 DIAGNOSIS — F411 Generalized anxiety disorder: Secondary | ICD-10-CM

## 2022-04-18 DIAGNOSIS — F330 Major depressive disorder, recurrent, mild: Secondary | ICD-10-CM | POA: Diagnosis not present

## 2022-04-18 DIAGNOSIS — F4010 Social phobia, unspecified: Secondary | ICD-10-CM | POA: Diagnosis not present

## 2022-04-18 DIAGNOSIS — F410 Panic disorder [episodic paroxysmal anxiety] without agoraphobia: Secondary | ICD-10-CM | POA: Diagnosis not present

## 2022-04-18 DIAGNOSIS — G4726 Circadian rhythm sleep disorder, shift work type: Secondary | ICD-10-CM

## 2022-04-18 NOTE — Progress Notes (Signed)
Julie Webb [id number] 1990/11/14 31 y.o.  Subjective:   Patient ID:  Julie Webb is a 31 y.o. (DOB 03/16/1991) female.  Chief Complaint: No chief complaint on file.   HPI [address] presents to the office today for follow-up of SAD, panic disorder, GAD, MDD.  Describes mood today as "ok". Pleasant. Tearful. Mood symptoms - reports some anxiety and irritability - "not that often". Reports depression -"some of the time". Mood fluctuates - highs and lows. Stating "I'm struggling with energy". Has been managing aspects of household and caring for son while husband recovers from surgery. Feels like medications are working well - "it's a good combination or me". Family doing well. Stable interest and motivation. Taking medications as prescribed.  Energy levels lower. Active, does not have a regular exercise routine.  Enjoys some usual interests and activities. Married. Lives with husband and son. Spending time with family. Appetite adequate. Weight gain - 140 pounds. Sleeps well most nights. Averages 7 hours. Taking naps. Focus and concentration stable. Completing tasks. Managing aspects of household. 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  Denies SI or HI.  Denies AH or VH. Denies self harm. Denies substance use.  Reports migraines and tremors - seeing a neurologist tomorrow.   [facility]  Total GAD-7 Score 16      PHQ2-9    [facility] Office Visit from 07/23/2021 in [facility] [place] Visit from 11/12/2017 in [facility]  PHQ-2 Total Score 4 0  PHQ-9 Total Score 16 --      [facility] from 06/02/2021 in [facility]  C-SSRS RISK CATEGORY No Risk        Review of Systems:  Review of Systems  Musculoskeletal:  Negative for gait problem.  Neurological:   Negative for tremors.  Psychiatric/Behavioral:         Please refer to HPI    Medications: I have reviewed the patient's current medications.  Current Outpatient Medications  Medication Sig Dispense Refill   Armodafinil 250 MG tablet Take 1 tablet (250 mg total) by mouth daily. 30 tablet 2   brexpiprazole (REXULTI) 1 MG TABS tablet Take 1 tablet (1 mg total) by mouth daily. 90 tablet 1   buPROPion (WELLBUTRIN XL) 150 MG 24 hr tablet Take 1 tablet (150 mg total) by mouth daily. 90 tablet 1   buPROPion (WELLBUTRIN XL) 300 MG 24 hr tablet Take 1 tablet (300 mg total) by mouth daily after breakfast 90 tablet 1   DULoxetine (CYMBALTA) 60 MG capsule Take 1 capsule (60 mg total) by mouth daily after breakfast. 90 capsule 1   hydrOXYzine (ATARAX) 25 MG tablet Take 1 tablet (25 mg total) by mouth 3 (three) times daily as needed for anxiety. 30 tablet 0   propranolol (INDERAL) 20 MG tablet Take 1 tablet (20 mg total) by mouth 2 (two) times daily. 60 tablet 1   propranolol ER (INDERAL LA) 80 MG 24 hr capsule Take 1 capsule (80 mg total) by mouth daily. 30 capsule 11   SUMAtriptan (IMITREX) 50 MG tablet Take 1 tablet (50 mg total) by mouth at the onset of a migraine. Repeat in 2 hours. Max of 2 tablets in 24 hours. 12 tablet 6   traZODone (DESYREL) 50 MG tablet Take 1 tablet (50 mg total) by mouth  at bedtime. 30 tablet 5   No current facility-administered medications for this visit.    Medication Side Effects: None  Allergies:  Allergies  Allergen Reactions   Phenergan [Promethazine Hcl] Other (See Comments)    Restlessness w/ IV Phenergen. Probable mild EPS. Tolerates if taken w/ Benadryl.     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   Please see review of systems for further details on the patient's review from today.   Objective:   Physical Exam:  There were no vitals taken for this visit.  Physical Exam Constitutional:      General: She is not in acute distress. Musculoskeletal:        General: No deformity.  Neurological:     Mental Status: She is alert and oriented to person, place, and time.     Coordination: Coordination normal.  Psychiatric:        Attention and Perception: Attention and perception normal. She does not perceive auditory or visual hallucinations.        Mood and Affect: Mood normal. Mood is not anxious or depressed. Affect is not labile, blunt, angry or inappropriate.        Speech: Speech normal.        Behavior: Behavior normal.        Thought Content: Thought content normal. Thought content is not paranoid or delusional. Thought content does not include homicidal or suicidal ideation. Thought content does not include homicidal or suicidal plan.        Cognition and Memory: Cognition and memory normal.        Judgment: Judgment normal.     Comments: Insight intact     Lab Review:     Component Value Date/Time   NA 140 11/28/2021 1330   K 3.9 11/28/2021 1330   CL 106 11/28/2021 1330   CO2 28 11/28/2021 1330   GLUCOSE 94 11/28/2021 1330   BUN 7 11/28/2021 1330   CREATININE 0.75 11/28/2021 1330   CALCIUM 9.4 11/28/2021 1330   PROT 7.0 11/28/2021 1330   ALBUMIN 3.8 11/28/2021 1330   AST 18 11/28/2021 1330   ALT 18 11/28/2021 1330   ALKPHOS 55 11/28/2021 1330   BILITOT 0.4 11/28/2021 1330   GFRNONAA >60 11/28/2021 1330   GFRAA >60 06/05/2019 1928       Component Value Date/Time   WBC 5.3 11/28/2021 1330   RBC 4.90 11/28/2021 1330   HGB 15.0 11/28/2021 1330   HCT 43.9 11/28/2021 1330   PLT 254 11/28/2021 1330   MCV 89.6 11/28/2021 1330   MCH 30.6 11/28/2021 1330   MCHC 34.2  11/28/2021 1330   RDW 12.4 11/28/2021 1330   LYMPHSABS 1.6 11/28/2021 1330   MONOABS 0.3 11/28/2021 1330   EOSABS 0.1 11/28/2021 1330   BASOSABS 0.0 11/28/2021 1330    No results found for: "POCLITH", "LITHIUM"   No results found for: "PHENYTOIN", "PHENOBARB", "VALPROATE", "CBMZ"   .res Assessment: Plan:    Plan:  PDMP reviewed  Cymbalta '60mg'$  daily Wellbutrin XL '450mg'$  daily - denies seizure history - move to morning Rexulti 0.'5mg'$   daily  Provigil '250mg'$  daily for shift work disorder - taking as needed  D/C Trazadone '50mg'$  daily D/C Hydroxyzine '25mg'$  daily as needed - no script needed today  RTC 3 months  Patient advised to contact office with any questions, adverse effects, or acute worsening in signs and symptoms.  Discussed potential metabolic side effects associated with atypical antipsychotics, as well as potential risk for movement side effects. Advised pt to contact office if movement side effects occur.  Diagnoses and all orders for this visit:  Generalized anxiety disorder  Mild recurrent major depression ([place])  Panic disorder  Social anxiety disorder  Shift work sleep disorder     Please see After Visit Summary for patient specific instructions.  Future Appointments  Date Time Provider [place]  07/29/2022 11:00 AM Ward Givens, NP [facility] None    No orders of the defined types were placed in this encounter.   -------------------------------

## 2022-05-02 ENCOUNTER — Other Ambulatory Visit (HOSPITAL_COMMUNITY): Payer: Self-pay

## 2022-05-06 ENCOUNTER — Other Ambulatory Visit (HOSPITAL_COMMUNITY): Payer: Self-pay

## 2022-05-20 ENCOUNTER — Other Ambulatory Visit (HOSPITAL_COMMUNITY): Payer: Self-pay

## 2022-06-11 ENCOUNTER — Telehealth: Payer: No Typology Code available for payment source | Admitting: Physician Assistant

## 2022-06-11 ENCOUNTER — Other Ambulatory Visit (HOSPITAL_COMMUNITY): Payer: Self-pay

## 2022-06-11 DIAGNOSIS — K529 Noninfective gastroenteritis and colitis, unspecified: Secondary | ICD-10-CM | POA: Diagnosis not present

## 2022-06-11 MED ORDER — ONDANSETRON 4 MG PO TBDP
4.0000 mg | ORAL_TABLET | Freq: Three times a day (TID) | ORAL | 0 refills | Status: DC | PRN
  Filled 2022-06-11 (×2): qty 20, 7d supply, fill #0

## 2022-06-11 MED ORDER — CIPROFLOXACIN HCL 500 MG PO TABS
500.0000 mg | ORAL_TABLET | Freq: Two times a day (BID) | ORAL | 0 refills | Status: AC
  Filled 2022-06-11 (×2): qty 10, 5d supply, fill #0

## 2022-06-11 NOTE — Progress Notes (Signed)
Virtual Visit Consent   Julie Webb, you are scheduled for a virtual visit with a [facility] provider today. Just as with appointments in the office, your consent must be obtained to participate. Your consent will be active for this visit and any virtual visit you may have with one of our providers in the next 365 days. If you have a MyChart account, a copy of this consent can be sent to you electronically.  As this is a virtual visit, video technology does not allow for your provider to perform a traditional examination. This may limit your provider's ability to fully assess your condition. If your provider identifies any concerns that need to be evaluated in person or the need to arrange testing (such as labs, EKG, etc.), we will make arrangements to do so. Although advances in technology are sophisticated, we cannot ensure that it will always work on either your end or our end. If the connection with a video visit is poor, the visit may have to be switched to a telephone visit. With either a video or telephone visit, we are not always able to ensure that we have a secure connection.  By engaging in this virtual visit, you consent to the provision of healthcare and authorize for your insurance to be billed (if applicable) for the services provided during this visit. Depending on your insurance coverage, you may receive a charge related to this service.  I need to obtain your verbal consent now. Are you willing to proceed with your visit today? Julie Webb has provided verbal consent on 06/11/2022 for a virtual visit (video or telephone). Julie Daring, PA-C  Date: 06/11/2022 5:04 PM  Virtual Visit via Video Note   IMar Webb, connected with  Julie Webb  ([id number], 25-[id number]) on 06/11/22 at  5:00 PM EDT by a video-enabled telemedicine application and verified that I am speaking with the correct person using two identifiers.  Location: Patient: Virtual Visit Location  Patient: Home Provider: Virtual Visit Location Provider: Home Office   I discussed the limitations of evaluation and management by telemedicine and the availability of in person appointments. The patient expressed understanding and agreed to proceed.    History of Present Illness: Julie Webb is a 31 y.o. who identifies as a female who was assigned female at birth, and is being seen today for GI symptoms.  HPI: Emesis  This is a new problem. The current episode started in the past 7 days (husband and son had similar issues, but they improved while she has not). The problem occurs 2 to 4 times per day. The problem has been gradually worsening. The emesis has an appearance of bile and stomach contents. There has been no fever. The fever has been present for 5 days or more. Associated symptoms include abdominal pain, chills, diarrhea (twice daily), a fever (99-100), headaches and myalgias. Pertinent negatives include no dizziness. Risk factors include ill contacts. She has tried increased fluids for the symptoms. The treatment provided no relief.    No longer breastfeeding.  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 Probable mild EPS. Tolerates if taken w/ Benadryl.    Medications:  Current Outpatient Medications:    ciprofloxacin (CIPRO) 500 MG tablet, Take 1 tablet (500 mg total) by mouth 2 (two) times daily for 5 days., Disp: 10 tablet, Rfl: 0   ondansetron (ZOFRAN-ODT) 4 MG disintegrating tablet, Take 1 tablet (4 mg total) by mouth every 8 (eight) hours as needed for nausea or vomiting., Disp: 20 tablet, Rfl: 0   Armodafinil 250 MG tablet, Take 1 tablet (250 mg total) by mouth daily., Disp: 30 tablet, Rfl: 2   brexpiprazole (REXULTI) 1 MG TABS tablet, Take 1 tablet (1 mg total) by mouth daily., Disp: 90 tablet, Rfl: 1   buPROPion (WELLBUTRIN XL) 150 MG 24 hr tablet, Take 1 tablet (150 mg total) by mouth daily., Disp: 90 tablet, Rfl: 1   buPROPion (WELLBUTRIN XL) 300 MG 24 hr tablet, Take 1 tablet (300 mg total) by mouth daily after breakfast, Disp: 90 tablet, Rfl: 1   DULoxetine (CYMBALTA) 60 MG capsule, Take 1 capsule (60 mg total) by mouth daily after breakfast., Disp: 90 capsule, Rfl: 1   hydrOXYzine (ATARAX) 25 MG tablet, Take 1 tablet (25 mg total) by mouth 3 (three) times daily as needed for anxiety., Disp: 30 tablet, Rfl: 0   propranolol (INDERAL) 20 MG tablet, Take 1 tablet (20 mg total) by mouth 2 (two) times daily., Disp: 60 tablet, Rfl: 1   propranolol ER (INDERAL LA) 80 MG 24 hr capsule, Take 1 capsule (80 mg total) by mouth daily., Disp: 30 capsule, Rfl: 11   SUMAtriptan (IMITREX) 50 MG tablet, Take 1 tablet (50 mg total) by mouth at the onset of a migraine. Repeat in 2 hours. Max of 2 tablets in 24 hours., Disp: 12 tablet, Rfl: 6   traZODone (DESYREL) 50 MG  tablet, Take 1 tablet (50 mg total) by mouth at bedtime., Disp: 30 tablet, Rfl: 5  Observations/Objective: Patient is well-developed, well-nourished in no acute distress.  Resting comfortably at home.  Head is normocephalic, atraumatic.  No labored breathing.  Speech is clear and coherent with logical content.  Patient is alert and oriented at baseline.    Assessment and Plan: 1. Gastroenteritis - ciprofloxacin (CIPRO) 500 MG tablet; Take 1 tablet (500 mg total) by mouth 2 (two) times daily for 5 days.  Dispense: 10 tablet; Refill: 0 - ondansetron (ZOFRAN-ODT) 4 MG disintegrating tablet; Take 1 tablet (4 mg total) by mouth every 8 (eight) hours as needed for nausea or vomiting.  Dispense: 20 tablet; Refill: 0  - Suspect infectious gastroenteritis, bacterial infection suspected due to prolonged symptoms - Cipro prescribed - Zofran for nausea - Push fluids, electrolyte beverages - Liquid diet, then increase to soft/bland (BRAT) diet over next day, then increase diet as tolerated - Seek in person evaluation if  not improving or symptoms worsen   Follow Up Instructions: I discussed the assessment and treatment plan with the patient. The patient was provided an opportunity to ask questions and all were answered. The patient agreed with the plan and demonstrated an understanding of the instructions.  A copy of instructions were sent to the patient via MyChart unless otherwise noted below.   The patient was advised to call back or seek an in-person evaluation if the symptoms worsen or if the condition fails to improve as anticipated.  Time:  I spent 8 minutes with the patient via telehealth technology discussing the above problems/concerns.    Julie Daring, PA-C

## 2022-06-11 NOTE — Patient Instructions (Signed)
Audrie E Ransier, thank you for joining [name], PA-C for today's virtual visit.  While this provider is not your primary care provider (PCP), if your PCP is located in our provider database this encounter information will be shared with them immediately following your visit.  Consent: (Patient) Julie Webb provided verbal consent for this virtual visit at the beginning of the encounter.  Current Medications:  Current Outpatient Medications:    ciprofloxacin (CIPRO) 500 MG tablet, Take 1 tablet (500 mg total) by mouth 2 (two) times daily for 5 days., Disp: 10 tablet, Rfl: 0   ondansetron (ZOFRAN-ODT) 4 MG disintegrating tablet, Take 1 tablet (4 mg total) by mouth every 8 (eight) hours as needed for nausea or vomiting., Disp: 20 tablet, Rfl: 0   Armodafinil 250 MG tablet, Take 1 tablet (250 mg total) by mouth daily., Disp: 30 tablet, Rfl: 2   brexpiprazole (REXULTI) 1 MG TABS tablet, Take 1 tablet (1 mg total) by mouth daily., Disp: 90 tablet, Rfl: 1   buPROPion (WELLBUTRIN XL) 150 MG 24 hr tablet, Take 1 tablet (150 mg total) by mouth daily., Disp: 90 tablet, Rfl: 1   buPROPion (WELLBUTRIN XL) 300 MG 24 hr tablet, Take 1 tablet (300 mg total) by mouth daily after breakfast, Disp: 90 tablet, Rfl: 1   DULoxetine (CYMBALTA) 60 MG capsule, Take 1 capsule (60 mg total) by mouth daily after breakfast., Disp: 90 capsule, Rfl: 1   hydrOXYzine (ATARAX) 25 MG tablet, Take 1 tablet (25 mg total) by mouth 3 (three) times daily as needed for anxiety., Disp: 30 tablet, Rfl: 0   propranolol (INDERAL) 20 MG tablet, Take 1 tablet (20 mg total) by mouth 2 (two) times daily., Disp: 60 tablet, Rfl: 1   propranolol ER (INDERAL LA) 80 MG 24 hr capsule, Take 1 capsule (80 mg total) by mouth daily., Disp: 30 capsule, Rfl: 11   SUMAtriptan (IMITREX) 50 MG tablet, Take 1 tablet (50 mg total) by mouth at the onset of a migraine. Repeat in 2 hours. Max of 2 tablets in 24 hours., Disp: 12 tablet, Rfl: 6    traZODone (DESYREL) 50 MG tablet, Take 1 tablet (50 mg total) by mouth at bedtime., Disp: 30 tablet, Rfl: 5   Medications ordered in this encounter:  Meds ordered this encounter  Medications   ciprofloxacin (CIPRO) 500 MG tablet    Sig: Take 1 tablet (500 mg total) by mouth 2 (two) times daily for 5 days.    Dispense:  10 tablet    Refill:  0    Order Specific Question:   Supervising Provider    Answer:   MILLER, BRIAN [[id number]]   ondansetron (ZOFRAN-ODT) 4 MG disintegrating tablet    Sig: Take 1 tablet (4 mg total) by mouth every 8 (eight) hours as needed for nausea or vomiting.    Dispense:  20 tablet    Refill:  0    Order Specific Question:   Supervising Provider    Answer:   Sabra Heck, BRIAN [[id number]]     *If you need refills on other medications prior to your next appointment, please contact your pharmacy*  Follow-Up: Call back or seek an in-person evaluation if the symptoms worsen or if the condition fails to improve as anticipated.  Other Instructions  Gastroenteritis You will learn about gastroenteritis, or stomach flu, symptoms, treatment, and when to seek medical care. To view the content, go to this web address: [email or web address]  This video will expire on: 04/11/2024. If you  need access to this video following this date, please reach out to the healthcare provider who assigned it to you. This information is not intended to replace advice given to you by your health care provider. Make sure you discuss any questions you have with your health care provider. Else[employer] Patient Education  [place].    If you have been instructed to have an in-person evaluation today at a local Urgent Care facility, please use the link below. It will take you to a list of all of our available [facility], including address, phone number and hours of operation. Please do not delay care.  [facility]  If you or a family member do not have a primary  care provider, use the link below to schedule a visit and establish care. When you choose a [facility] primary care physician or advanced practice provider, you gain a long-term partner in health. Find a Primary Care Provider  Learn more about [facility]'s in-office and virtual care options: [facility]

## 2022-06-13 ENCOUNTER — Other Ambulatory Visit (HOSPITAL_COMMUNITY): Payer: Self-pay

## 2022-07-18 ENCOUNTER — Encounter: Payer: Self-pay | Admitting: Adult Health

## 2022-07-18 ENCOUNTER — Other Ambulatory Visit (HOSPITAL_COMMUNITY): Payer: Self-pay

## 2022-07-18 ENCOUNTER — Ambulatory Visit (INDEPENDENT_AMBULATORY_CARE_PROVIDER_SITE_OTHER): Payer: No Typology Code available for payment source | Admitting: Adult Health

## 2022-07-18 DIAGNOSIS — F411 Generalized anxiety disorder: Secondary | ICD-10-CM

## 2022-07-18 DIAGNOSIS — F410 Panic disorder [episodic paroxysmal anxiety] without agoraphobia: Secondary | ICD-10-CM

## 2022-07-18 DIAGNOSIS — G4726 Circadian rhythm sleep disorder, shift work type: Secondary | ICD-10-CM | POA: Diagnosis not present

## 2022-07-18 DIAGNOSIS — F4010 Social phobia, unspecified: Secondary | ICD-10-CM

## 2022-07-18 DIAGNOSIS — F330 Major depressive disorder, recurrent, mild: Secondary | ICD-10-CM | POA: Diagnosis not present

## 2022-07-18 MED ORDER — BREXPIPRAZOLE 1 MG PO TABS
1.0000 mg | ORAL_TABLET | Freq: Every day | ORAL | 1 refills | Status: DC
  Filled 2022-07-18: qty 90, 90d supply, fill #0

## 2022-07-18 MED ORDER — DULOXETINE HCL 60 MG PO CPEP
60.0000 mg | ORAL_CAPSULE | Freq: Every day | ORAL | 1 refills | Status: DC
  Filled 2022-07-18 – 2022-09-23 (×2): qty 90, 90d supply, fill #0

## 2022-07-18 MED ORDER — BUPROPION HCL ER (XL) 300 MG PO TB24
300.0000 mg | ORAL_TABLET | Freq: Every day | ORAL | 1 refills | Status: DC
  Filled 2022-07-18: qty 90, 90d supply, fill #0

## 2022-07-18 MED ORDER — ARMODAFINIL 250 MG PO TABS
250.0000 mg | ORAL_TABLET | Freq: Every day | ORAL | 2 refills | Status: DC
  Filled 2022-07-18: qty 30, 30d supply, fill #0

## 2022-07-18 MED ORDER — BUPROPION HCL ER (XL) 150 MG PO TB24
150.0000 mg | ORAL_TABLET | Freq: Every day | ORAL | 1 refills | Status: DC
  Filled 2022-07-18: qty 90, 90d supply, fill #0

## 2022-07-18 NOTE — Progress Notes (Signed)
Julie Webb [id number] November 25, 1990 31 y.o.  Subjective:   Patient ID:  Julie Webb is a 31 y.o. (DOB 11/05/1990) female.  Chief Complaint: No chief complaint on file.   HPI [address] presents to the office today for follow-up of  SAD, panic disorder, GAD, MDD.  Describes mood today as "ok". Pleasant. Tearful. Mood symptoms - reports some anxiety - "it's been a lot better". Able to get out of the house and do more. Denies depression and irritability. Mood fluctuates "a little bit" - nothing extreme. Stating "I'm doing better emotionally". Feels like medications are working well. Family doing well. Stable interest and motivation. Taking medications as prescribed.  Energy levels lower - "still struggling with that." Active, does not have a regular exercise routine.  Enjoys some usual interests and activities. Married. Lives with husband and son. Spending time with family. Appetite adequate. Weight gain - 140 pounds. Sleeps well most nights. Averages 8 to 9 hours. Napping during the day 2 to 3 hours. Focus and concentration stable. Completing tasks. Managing aspects of household. 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  Denies SI or HI.  Denies AH or VH. Denies self harm. Denies substance use.       [place] Office Visit from 07/23/2021 in [facility] at [facility]  Total GAD-7 Score 16      PHQ2-9    [place] Office Visit from 07/23/2021 in [facility] at [facility] Visit from 11/12/2017 in [place] at [facility]  PHQ-2 Total Score 4 0  PHQ-9 Total Score 16 --      [place] [facility] from 06/02/2021 in [place] [facility] [facility]  C-SSRS RISK CATEGORY No Risk        Review of Systems:  Review of Systems  Musculoskeletal:  Negative for gait problem.  Neurological:  Negative for tremors.  Psychiatric/Behavioral:         Please refer to HPI     Medications: I have reviewed the patient's current medications.  Current Outpatient Medications  Medication Sig Dispense Refill   Armodafinil 250 MG tablet Take 1 tablet (250 mg total) by mouth daily. 30 tablet 2   brexpiprazole (REXULTI) 1 MG TABS tablet Take 1 tablet (1 mg total) by mouth daily. 90 tablet 1   buPROPion (WELLBUTRIN XL) 150 MG 24 hr tablet Take 1 tablet (150 mg total) by mouth daily. 90 tablet 1   buPROPion (WELLBUTRIN XL) 300 MG 24 hr tablet Take 1 tablet (300 mg total) by mouth daily after breakfast 90 tablet 1   DULoxetine (CYMBALTA) 60 MG capsule Take 1 capsule (60 mg total) by mouth daily after breakfast. 90 capsule 1   hydrOXYzine (ATARAX) 25 MG tablet Take 1 tablet (25 mg total) by mouth 3 (three) times daily as needed for anxiety. 30 tablet 0   ondansetron (ZOFRAN-ODT) 4 MG disintegrating tablet Take 1 tablet (4 mg total) by mouth every 8 (eight) hours as needed for nausea or vomiting. 20 tablet 0   propranolol (INDERAL) 20 MG tablet Take 1 tablet (20 mg total) by mouth 2 (two) times daily. 60 tablet 1   propranolol ER (INDERAL LA) 80 MG 24 hr capsule Take 1 capsule (80 mg total) by mouth daily. 30 capsule 11   SUMAtriptan (IMITREX) 50 MG tablet Take 1 tablet (50 mg total) by mouth at the onset of a migraine. Repeat in 2 hours.  Max of 2 tablets in 24 hours. 12 tablet 6   No current facility-administered medications for this visit.    Medication Side Effects: None  Allergies:  Allergies  Allergen Reactions   Phenergan [Promethazine Hcl] Other (See Comments)    Restlessness w/ IV Phenergen. Probable mild EPS. Tolerates if taken w/ Benadryl.     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   Please see review of systems for further details on the patient's review from today.   Objective:   Physical Exam:  There were no vitals taken for this visit.  Physical Exam Constitutional:      General: She is not in acute distress. Musculoskeletal:        General: No deformity.  Neurological:     Mental Status: She is alert and oriented to person, place, and time.     Coordination: Coordination normal.  Psychiatric:        Attention and Perception: Attention and perception normal. She does not perceive auditory or visual hallucinations.        Mood and Affect: Mood normal. Mood is not anxious or depressed. Affect is not labile, blunt, angry or inappropriate.        Speech: Speech normal.        Behavior: Behavior normal.        Thought Content: Thought content normal. Thought content is not paranoid or delusional. Thought content does not include homicidal or suicidal ideation. Thought content does not include homicidal or suicidal plan.        Cognition and Memory: Cognition and memory normal.        Judgment: Judgment normal.     Comments: Insight intact     Lab Review:     Component Value Date/Time   NA 140 11/28/2021 1330   K 3.9 11/28/2021 1330   CL 106 11/28/2021 1330   CO2 28 11/28/2021 1330   GLUCOSE 94 11/28/2021 1330   BUN 7 11/28/2021 1330   CREATININE 0.75 11/28/2021 1330   CALCIUM 9.4 11/28/2021 1330   PROT 7.0 11/28/2021 1330   ALBUMIN 3.8 11/28/2021 1330   AST 18 11/28/2021 1330   ALT 18 11/28/2021 1330   ALKPHOS 55 11/28/2021 1330   BILITOT 0.4 11/28/2021 1330   GFRNONAA >60 11/28/2021 1330   GFRAA >60 06/05/2019 1928       Component Value Date/Time   WBC 5.3 11/28/2021 1330   RBC 4.90 11/28/2021 1330   HGB 15.0 11/28/2021 1330   HCT 43.9 11/28/2021 1330   PLT 254 11/28/2021 1330   MCV 89.6 11/28/2021 1330   MCH 30.6 11/28/2021 1330   MCHC 34.2 11/28/2021 1330    RDW 12.4 11/28/2021 1330   LYMPHSABS 1.6 11/28/2021 1330   MONOABS 0.3 11/28/2021 1330   EOSABS 0.1 11/28/2021 1330   BASOSABS 0.0 11/28/2021 1330    No results found for: "POCLITH", "LITHIUM"   No results found for: "PHENYTOIN", "PHENOBARB", "VALPROATE", "CBMZ"   .res Assessment: Plan:    Plan:  PDMP reviewed  Cymbalta '60mg'$  daily Wellbutrin XL '450mg'$  daily - denies seizure history -  move to morning Rexulti 0.'5mg'$  daily  Provigil '250mg'$  daily for shift work disorder - taking as needed   Plans to see PCP for iron/B12/D levels.  RTC 3 months  Patient advised to contact office with any questions, adverse effects, or acute worsening in signs and symptoms.  Discussed potential metabolic side effects associated with atypical antipsychotics, as well as potential risk for movement side effects. Advised pt to contact office if movement side effects occur.   Diagnoses and all orders for this visit:  Mild recurrent major depression ([place]) -     brexpiprazole (REXULTI) 1 MG TABS tablet; Take 1 tablet (1 mg total) by mouth daily. -     buPROPion (WELLBUTRIN XL) 150 MG 24 hr tablet; Take 1 tablet (150 mg total) by mouth daily. -     buPROPion (WELLBUTRIN XL) 300 MG 24 hr tablet; Take 1 tablet (300 mg total) by mouth daily after breakfast -     DULoxetine (CYMBALTA) 60 MG capsule; Take 1 capsule (60 mg total) by mouth daily after breakfast.  Shift work sleep disorder -     Armodafinil 250 MG tablet; Take 1 tablet (250 mg total) by mouth daily.  Generalized anxiety disorder -     brexpiprazole (REXULTI) 1 MG TABS tablet; Take 1 tablet (1 mg total) by mouth daily. -     buPROPion (WELLBUTRIN XL) 150 MG 24 hr tablet; Take 1 tablet (150 mg total) by mouth daily. -     buPROPion (WELLBUTRIN XL) 300 MG 24 hr tablet; Take 1 tablet (300 mg total) by mouth daily after breakfast -     DULoxetine (CYMBALTA) 60 MG capsule; Take 1 capsule (60 mg total) by mouth daily after breakfast.  Panic  disorder -     DULoxetine (CYMBALTA) 60 MG capsule; Take 1 capsule (60 mg total) by mouth daily after breakfast.  Social anxiety disorder -     DULoxetine (CYMBALTA) 60 MG capsule; Take 1 capsule (60 mg total) by mouth daily after breakfast.     Please see After Visit Summary for patient specific instructions.  Future Appointments  Date Time Provider [place]  07/29/2022 11:00 AM Ward Givens, NP [facility] None    No orders of the defined types were placed in this encounter.   -------------------------------

## 2022-07-29 ENCOUNTER — Ambulatory Visit: Payer: No Typology Code available for payment source | Admitting: Adult Health

## 2022-08-14 ENCOUNTER — Ambulatory Visit: Payer: No Typology Code available for payment source | Admitting: Adult Health

## 2022-08-14 ENCOUNTER — Encounter: Payer: Self-pay | Admitting: Adult Health

## 2022-08-20 NOTE — Progress Notes (Unsigned)
[name] DOB: Mar 01, 1991  REASON FOR VISIT: follow up Julie FROM: patient PRIMARY NEUROLOGIST: Dr. Krista Webb  Chief Complaint  Patient presents with   Follow-up    Rm 9, husband .Migraines  same and Essential tremor , bil hands same, head worsening since last seen.   Could not take propranolol, caused dizziness.  (Both IR/LA). Placed in allergies.     Julie OF PRESENT ILLNESS: Today 08/21/22  Julie Webb is a 31 y.o. female who has been followed in this office for essential tremor and migraines.. Returns today for follow-up.  At the last visit she was started on propranolol for tremor however she reports that she cannot tolerate extended release or immediate release as it caused dizziness.  Reports that the tremor is in her hands bilaterally in her head. Notices it daily typically with activity. Some trouble with handwriting, no trouble eating but has trouble with cups- has to use both hands. Has trouble with buttons. Husband does feel that her tremors are more consistent in the last 6 months.   She was also given Imitrex for migraines.  She reports that she has had 2 migraines int he past month. Imitrex works well. Resolves headaches with 30-60 minutes.  More severe migraines Imitrex does not work however she is not taking the second dose.  Julie Webb is a 31 year old female, seen in request by her primary care nurse practitioner Julie Webb for evaluation of essential tremor, chronic migraine, she is accompanied by her husband at today's visit January 17, 2022   I reviewed and summarized the referring note. 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, she describes difficulty starting IV needle, now she work [employer]  behind the computer and  phone number, she denies difficulty handling her job  Only observe mild bilateral hand symmetric tremor drawing spiral circle  She also had long Julie of chronic migraine headaches, bilateral frontal pressure headaches, with light noise sensitivity, nauseous, Excedrin Migraine works, sleep is very helpful  Laboratory evaluation in February 2023 showed normal TSH, CMP CBC  REVIEW OF SYSTEMS: Out of a complete 14 system review of symptoms, the patient complains only of the following symptoms, and all other reviewed systems are negative.  ALLERGIES: Allergies  Allergen Reactions   Phenergan [Promethazine Hcl] Other (See Comments)    Restlessness w/ IV Phenergen. Probable mild EPS. Tolerates if taken w/ Benadryl.     HOME MEDICATIONS: Outpatient Medications Prior to Visit  Medication Sig Dispense Refill   Armodafinil 250 MG tablet Take 1 tablet (250 mg total) by mouth daily. 30 tablet 2   brexpiprazole (REXULTI) 1 MG TABS tablet Take 1 tablet (1 mg total) by mouth daily. 90 tablet 1   buPROPion (WELLBUTRIN XL) 150 MG 24 hr tablet Take 1 tablet (150 mg total) by mouth daily. 90 tablet 1   buPROPion (WELLBUTRIN XL) 300 MG 24 hr tablet Take 1 tablet (300 mg total) by mouth daily after breakfast 90 tablet 1   DULoxetine (CYMBALTA) 60 MG capsule Take 1 capsule (60 mg total) by mouth daily after breakfast. 90 capsule 1   hydrOXYzine (ATARAX) 25 MG tablet Take 1 tablet (25 mg total) by mouth 3 (three) times daily as needed for anxiety. 30 tablet 0  ondansetron (ZOFRAN-ODT) 4 MG disintegrating tablet Take 1 tablet (4 mg total) by mouth every 8 (eight) hours as needed for nausea or vomiting. 20 tablet 0   propranolol (INDERAL) 20 MG tablet Take 1 tablet (20 mg total) by mouth 2 (two) times daily. 60 tablet 1   propranolol ER (INDERAL LA) 80 MG 24 hr capsule Take 1 capsule (80 mg total) by mouth daily. 30 capsule 11   SUMAtriptan (IMITREX) 50 MG tablet Take 1 tablet (50 mg total) by mouth at the  onset of a migraine. Repeat in 2 hours. Max of 2 tablets in 24 hours. 12 tablet 6   No facility-administered medications prior to visit.    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   Migraines    Vaginal Pap smear, abnormal     PAST SURGICAL Julie: Past Surgical Julie:  Procedure Laterality Date   CHOLECYSTECTOMY Right 09/19/2020    FAMILY Julie: Family Julie  Problem Relation Age of Onset   Hyperlipidemia Mother    Mental illness Mother    Fibroids Mother        uterine   Hypertension Father    Heart attack Father    Heart disease Maternal Grandmother    Hyperlipidemia Maternal Grandmother    Hypertension Maternal Grandmother    Heart disease Maternal Grandfather    Hyperlipidemia Maternal Grandfather    Hypertension Maternal Grandfather    Pancreatic cancer Paternal Grandmother    Mesothelioma Paternal Grandfather     SOCIAL Julie: Social Julie   Socioeconomic Julie   Marital status: Married    Spouse name: Not on file   Number of children: Not on file   Years of education: Not on file   Highest education level: Not on file  Occupational Julie   Not on file  Tobacco Use   Smoking status: Never   Smokeless tobacco: Never  Vaping Use   Vaping Use: Never used  Substance and Sexual Activity   Alcohol use: Yes    Comment: occasional   Drug use: No   Sexual activity: Yes    Birth control/protection: I.U.D.  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 [employer] with husband   Has a son born 11/23/2019   Grew up in [place]   Enjoys reading   Right handed   Has an [place] Bulldog   3 cups coffee per day, soda every other day      Social Determinants of Systems developer Strain: Not on file  Food Insecurity: Not on file  Transportation Needs: Not on file  Physical Activity: Not on file  Stress: Not on file  Social Connections: Not on file  Intimate Partner Violence: Not on file      PHYSICAL EXAM  Vitals:   08/21/22 0945  BP: 114/78  Pulse: 80  Weight: 164 lb 12.8 oz (74.8 kg)  Height: '5\' 4"'$  (1.626 m)   Body mass index is 28.29 kg/m.  Generalized: Well developed, in no acute distress   Neurological examination  Mentation: Alert oriented to time, place, Julie taking. Follows all commands speech and language fluent Cranial nerve II-XII: Pupils were equal  round reactive to light. Extraocular movements were full, visual field were full on confrontational test. Facial sensation and strength were normal. Uvula tongue midline. Head turning and shoulder shrug  were normal and symmetric. Motor: The motor testing reveals 5 over 5 strength of all 4 extremities.  Positive seated straight tremor noted in the lower extremities.  Good symmetric motor tone is noted throughout.  Sensory: Sensory testing is intact to soft touch on all 4 extremities. No evidence of extinction is noted.  Coordination: Cerebellar testing reveals good finger-nose-finger and heel-to-shin bilaterally.  Mild tremor with finger-nose-finger Gait and station: Able to stand from a sitting position without assistance.  Gait is normal.  Good stride, good arm swing 2 steps with turns. Romberg is negative. No drift is seen.    DIAGNOSTIC DATA (LABS, IMAGING, TESTING) - I reviewed patient records, labs, notes, testing and imaging myself where available.  Lab Results  Component Value Date   WBC 5.3 11/28/2021   HGB 15.0 11/28/2021   HCT 43.9 11/28/2021   MCV 89.6 11/28/2021   PLT 254 11/28/2021      Component Value Date/Time   NA 140 11/28/2021 1330   K 3.9 11/28/2021 1330   CL 106 11/28/2021 1330   CO2 28 11/28/2021 1330   GLUCOSE 94 11/28/2021 1330   BUN 7 11/28/2021  1330   CREATININE 0.75 11/28/2021 1330   CALCIUM 9.4 11/28/2021 1330   PROT 7.0 11/28/2021 1330   ALBUMIN 3.8 11/28/2021 1330   AST 18 11/28/2021 1330   ALT 18 11/28/2021 1330   ALKPHOS 55 11/28/2021 1330   BILITOT 0.4 11/28/2021 1330   GFRNONAA >60 11/28/2021 1330   GFRAA >60 06/05/2019 1928   Lab Results  Component Value Date   CHOL 151 11/28/2021   HDL 49 11/28/2021   LDLCALC 89 11/28/2021   TRIG 65 11/28/2021   CHOLHDL 3.1 11/28/2021   Lab Results  Component Value Date   HGBA1C 4.7 (L) 11/28/2021   Lab Results  Component Value Date   VITAMINB12 >1550 (H) 10/03/2021   Lab Results  Component Value Date   TSH 1.143 11/28/2021      ASSESSMENT AND PLAN 31 y.o. year old female  has a past medical Julie of Anxiety, Depression, Family Julie of adverse reaction to anesthesia, Glomerulonephritis, poststreptococcal, Herpes simplex type 2 infection (02/23/2019), Migraines, and Vaginal Pap smear, abnormal. here with:  1.  Migraine headaches  -Continue Imitrex 50 mg at the onset of a migraine can repeat in 2 hours if needed  2.  Essential tremor  -Start primidone 25 mg at bedtime -Reviewed potential side effects with patient and provided her with a handout  Advised that if her symptoms worsen or she develops new symptoms she should let us know. Follow-up in 6 months or sooner if needed    Ward Givens, MSN, NP-C 08/20/2022, 5:01 PM [facility] [address] [place], [place] [address] [phone]

## 2022-08-21 ENCOUNTER — Encounter: Payer: Self-pay | Admitting: Adult Health

## 2022-08-21 ENCOUNTER — Other Ambulatory Visit (HOSPITAL_COMMUNITY): Payer: Self-pay

## 2022-08-21 ENCOUNTER — Ambulatory Visit (INDEPENDENT_AMBULATORY_CARE_PROVIDER_SITE_OTHER): Payer: No Typology Code available for payment source | Admitting: Adult Health

## 2022-08-21 VITALS — BP 114/78 | HR 80 | Ht 64.0 in | Wt 164.8 lb

## 2022-08-21 DIAGNOSIS — G43709 Chronic migraine without aura, not intractable, without status migrainosus: Secondary | ICD-10-CM

## 2022-08-21 DIAGNOSIS — G250 Essential tremor: Secondary | ICD-10-CM | POA: Diagnosis not present

## 2022-08-21 MED ORDER — PRIMIDONE 50 MG PO TABS
25.0000 mg | ORAL_TABLET | Freq: Every day | ORAL | 5 refills | Status: DC
  Filled 2022-08-21: qty 30, 60d supply, fill #0

## 2022-08-21 NOTE — Patient Instructions (Signed)
Your Plan:  Continue imitrex for migraines Start Primidone 25 mg at bedtime for tremor If your symptoms worsen or you develop new symptoms please let us know.     Thank you for coming to see [place] at [facility]. I hope we have been able to provide you high quality care today.  You may receive a patient satisfaction survey over the next few weeks. We would appreciate your feedback and comments so that we may continue to improve ourselves and the health of our patients.

## 2022-09-02 ENCOUNTER — Ambulatory Visit (INDEPENDENT_AMBULATORY_CARE_PROVIDER_SITE_OTHER): Payer: No Typology Code available for payment source | Admitting: Family

## 2022-09-02 ENCOUNTER — Encounter: Payer: Self-pay | Admitting: Family

## 2022-09-02 ENCOUNTER — Other Ambulatory Visit (HOSPITAL_BASED_OUTPATIENT_CLINIC_OR_DEPARTMENT_OTHER): Payer: Self-pay

## 2022-09-02 VITALS — BP 115/74 | HR 96 | Temp 98.0°F | Resp 16 | Ht 63.5 in | Wt 166.8 lb

## 2022-09-02 DIAGNOSIS — Z0000 Encounter for general adult medical examination without abnormal findings: Secondary | ICD-10-CM | POA: Diagnosis not present

## 2022-09-02 DIAGNOSIS — R400 Somnolence: Secondary | ICD-10-CM | POA: Diagnosis not present

## 2022-09-02 LAB — TSH: TSH: 1.55 u[IU]/mL (ref 0.35–5.50)

## 2022-09-02 LAB — COMPREHENSIVE METABOLIC PANEL
ALT: 16 U/L (ref 0–35)
AST: 16 U/L (ref 0–37)
Albumin: 4.4 g/dL (ref 3.5–5.2)
Alkaline Phosphatase: 63 U/L (ref 39–117)
BUN: 11 mg/dL (ref 6–23)
CO2: 29 mEq/L (ref 19–32)
Calcium: 9.1 mg/dL (ref 8.4–10.5)
Chloride: 103 mEq/L (ref 96–112)
Creatinine, Ser: 0.77 mg/dL (ref 0.40–1.20)
GFR: 102.98 mL/min (ref >60.00)
Glucose, Bld: 85 mg/dL (ref 70–99)
Potassium: 4.5 mEq/L (ref 3.5–5.1)
Sodium: 137 mEq/L (ref 135–145)
Total Bilirubin: 0.3 mg/dL (ref 0.2–1.2)
Total Protein: 6.9 g/dL (ref 6.0–8.3)

## 2022-09-02 LAB — CBC WITH DIFFERENTIAL/PLATELET
Basophils Absolute: 0.1 10*3/uL (ref 0.0–0.1)
Basophils Relative: 1 % (ref 0.0–3.0)
Eosinophils Absolute: 0 10*3/uL (ref 0.0–0.7)
Eosinophils Relative: 0.8 % (ref 0.0–5.0)
HCT: 42.2 % (ref 36.0–46.0)
Hemoglobin: 13.9 g/dL (ref 12.0–15.0)
Lymphocytes Relative: 24.8 % (ref 12.0–46.0)
Lymphs Abs: 1.4 10*3/uL (ref 0.7–4.0)
MCHC: 32.9 g/dL (ref 30.0–36.0)
MCV: 90.4 fl (ref 78.0–100.0)
Monocytes Absolute: 0.4 10*3/uL (ref 0.1–1.0)
Monocytes Relative: 7.1 % (ref 3.0–12.0)
Neutro Abs: 3.7 10*3/uL (ref 1.4–7.7)
Neutrophils Relative %: 66.3 % (ref 43.0–77.0)
Platelets: 231 10*3/uL (ref 150.0–400.0)
RBC: 4.67 Mil/uL (ref 3.87–5.11)
RDW: 13.3 % (ref 11.5–15.5)
WBC: 5.6 10*3/uL (ref 4.0–10.5)

## 2022-09-02 LAB — LIPID PANEL
Cholesterol: 168 mg/dL (ref 0–200)
HDL: 57.6 mg/dL (ref >39.00)
LDL Cholesterol: 91 mg/dL (ref 0–99)
NonHDL: 110.17
Total CHOL/HDL Ratio: 3
Triglycerides: 98 mg/dL (ref 0.0–149.0)
VLDL: 19.6 mg/dL (ref 0.0–40.0)

## 2022-09-02 LAB — VITAMIN B12: Vitamin B-12: 532 pg/mL (ref 211–911)

## 2022-09-02 MED ORDER — COMIRNATY 30 MCG/0.3ML IM SUSY
PREFILLED_SYRINGE | INTRAMUSCULAR | 0 refills | Status: DC
  Filled 2022-09-02: qty 0.3, 1d supply, fill #0

## 2022-09-02 NOTE — Progress Notes (Signed)
Subjective:     Patient ID: Julie Webb, female    DOB: 10/05/1991, 31 y.o.   MRN: [id number]  Chief Complaint  Patient presents with   Annual Exam    HPI Patient is in today for cpx.  Immunizations: flu shot up to date.  Diet: diet is good.   Exercise: limited due to fatigue- tries to stay active with her son Wt Readings from Last 3 Encounters:  09/02/22 166 lb 12.8 oz (75.7 kg)  08/21/22 164 lb 12.8 oz (74.8 kg)  01/17/22 155 lb 12.8 oz (70.7 kg)  Pap Smear: 01/12/20 Vision:  up to date Dental: up to date (wearing Invisilign)   Tremor- continues to work with neuro and recently placed on primidone.  Anxiety/depression- reports that she spends much of her day sleeping and is "exhausted."  Continues to work with psychiatry and counselor.  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   Migraines    Vaginal Pap smear, abnormal     Past Surgical History:  Procedure Laterality Date   CHOLECYSTECTOMY Right 09/19/2020    Family History  Problem Relation Age of Onset   Hyperlipidemia Mother    Mental illness Mother    Fibroids Mother        uterine   Hypertension Father    Heart attack Father    Heart disease Maternal Grandmother    Hyperlipidemia Maternal Grandmother    Hypertension Maternal Grandmother    Heart disease Maternal Grandfather    Hyperlipidemia Maternal Grandfather    Hypertension Maternal Grandfather    Pancreatic cancer Paternal Grandmother    Mesothelioma Paternal Grandfather     Social History   Socioeconomic History   Marital status: Married    Spouse name: Not on file   Number of children: Not on file   Years of education: Not on file   Highest education  level: Not on file  Occupational History   Not on file  Tobacco Use   Smoking status: Never   Smokeless tobacco: Never  Vaping Use   Vaping Use: Never used  Substance and Sexual Activity   Alcohol use: Yes    Comment: occasional   Drug use: No   Sexual activity: Yes    Birth control/protection: I.U.D.  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 30 tablet 2  brexpiprazole (REXULTI) 1 MG TABS tablet Take 1 tablet (1 mg total) by mouth daily. 90 tablet 1   buPROPion (WELLBUTRIN XL) 150 MG 24 hr tablet Take 1 tablet (150 mg total) by mouth daily. 90 tablet 1   buPROPion (WELLBUTRIN XL) 300 MG 24 hr tablet Take 1 tablet (300 mg total) by mouth daily after breakfast 90 tablet 1   DULoxetine (CYMBALTA) 60 MG capsule Take 1 capsule (60 mg total) by mouth daily after breakfast. 90 capsule 1   hydrOXYzine (ATARAX) 25 MG tablet Take 1 tablet (25 mg total) by mouth 3 (three) times daily as needed for anxiety. 30 tablet 0   ondansetron (ZOFRAN-ODT) 4 MG disintegrating tablet Take 1 tablet (4 mg total) by mouth every 8 (eight) hours as needed for nausea or vomiting. 20 tablet 0   primidone (MYSOLINE) 50 MG tablet Take 0.5 tablets (25 mg total) by mouth at bedtime. 30 tablet 5   SUMAtriptan (IMITREX) 50 MG tablet Take 1 tablet (50 mg total) by mouth at the  onset of a migraine. Repeat in 2 hours. Max of 2 tablets in 24 hours. 12 tablet 6   propranolol (INDERAL) 20 MG tablet Take 1 tablet (20 mg total) by mouth 2 (two) times daily. (Patient not taking: Reported on 08/21/2022) 60 tablet 1   propranolol ER (INDERAL LA) 80 MG 24 hr capsule Take 1 capsule (80 mg total) by mouth daily. (Patient not taking: Reported on 08/21/2022) 30 capsule 11   No facility-administered medications prior to visit.    Allergies  Allergen Reactions   Propranolol     Dizziness   Phenergan [Promethazine Hcl] Other (See Comments)    Restlessness w/ IV Phenergen. Probable mild EPS. Tolerates if taken w/ Benadryl.     Review of Systems  Constitutional:  Negative for weight loss.  HENT:  Negative for congestion and hearing loss.   Eyes:  Negative for blurred vision.  Respiratory:  Negative for cough.   Gastrointestinal:  Negative for constipation and diarrhea.  Genitourinary:  Negative for dysuria and frequency.  Musculoskeletal:  Negative for joint pain and myalgias.  Skin:  Negative for rash.  Neurological:  Positive for headaches (has about 2x a week, imitrex helps most of the time).  Psychiatric/Behavioral:         Mood is improving.  Fatigue worsens her mood.         Objective:    Physical Exam  BP 115/74 (BP Location: Right Arm, Patient Position: Sitting, Cuff Size: Small)   Pulse 96   Temp 98 F (36.7 C) (Oral)   Resp 16   Ht 5' 3.5" (1.613 m)   Wt 166 lb 12.8 oz (75.7 kg)   SpO2 98%   BMI 29.08 kg/m  Wt Readings from Last 3 Encounters:  09/02/22 166 lb 12.8 oz (75.7 kg)  08/21/22 164 lb 12.8 oz (74.8 kg)  01/17/22 155 lb 12.8 oz (70.7 kg)   Physical Exam  Constitutional: She is oriented to person, place, and time. She appears well-developed and well-nourished. No distress.  HENT:  Head: Normocephalic and atraumatic.  Right Ear: Tympanic membrane and ear canal normal.  Left Ear: Tympanic membrane and ear canal normal.  Mouth/Throat:  Oropharynx is clear and moist.  Eyes: Pupils are equal, round, and reactive to light. No scleral icterus.  Neck: Normal range of motion. No thyromegaly present.  Cardiovascular: Normal rate and regular rhythm.   No murmur heard. Pulmonary/Chest: Effort normal and breath sounds normal. No respiratory distress.  He has no wheezes. She has no rales. She exhibits no tenderness.  Abdominal: Soft. Bowel sounds are normal. She exhibits no distension and no mass. There is no tenderness. There is no rebound and no guarding.  Musculoskeletal: She exhibits no edema.  Lymphadenopathy:    She has no cervical adenopathy.  Neurological: She is alert and oriented to person, place, and time. She has normal patellar reflexes. She exhibits normal muscle tone. Coordination normal.  Skin: Skin is warm and dry.  Psychiatric: She has a normal mood and affect. Her behavior is normal. Judgment and thought content normal.  Breast/pelvic: deferred        Assessment & Plan:       Assessment & Plan:   Problem List Items Addressed This Visit       Unprioritized   Preventative health care    Discussed healthy diet, exercise, weight loss.  Labs as ordered.  Pap will be due this spring and she will complete with GYN.  Encouraged her to get updated covid booster from her pharmacy.       Relevant Orders   B12   CBC with Differential/Platelet   TSH   Comp Met (CMET)   Lipid panel   Daytime somnolence - Primary    Chronic, worsening.  Will refer to sleep medicine or OSA evaluation.       Relevant Orders   Ambulatory referral to Pulmonology   B12   CBC with Differential/Platelet   TSH   Comp Met (CMET)   Lipid panel    I have discontinued Charlaine E. Bonine's propranolol and propranolol ER. I am also having her maintain her hydrOXYzine, SUMAtriptan, ondansetron, Armodafinil, brexpiprazole, buPROPion, buPROPion, DULoxetine, and primidone.  No orders of the defined types were placed in this encounter.

## 2022-09-02 NOTE — Assessment & Plan Note (Signed)
Chronic, worsening.  Will refer to sleep medicine or OSA evaluation.

## 2022-09-02 NOTE — Assessment & Plan Note (Signed)
Discussed healthy diet, exercise, weight loss.  Labs as ordered.  Pap will be due this spring and she will complete with GYN.  Encouraged her to get updated covid booster from her pharmacy.

## 2022-09-09 ENCOUNTER — Institutional Professional Consult (permissible substitution): Payer: No Typology Code available for payment source | Admitting: Acute Care

## 2022-09-23 ENCOUNTER — Institutional Professional Consult (permissible substitution): Payer: No Typology Code available for payment source | Admitting: Adult Health

## 2022-09-23 ENCOUNTER — Other Ambulatory Visit (HOSPITAL_COMMUNITY): Payer: Self-pay

## 2022-10-17 ENCOUNTER — Ambulatory Visit (INDEPENDENT_AMBULATORY_CARE_PROVIDER_SITE_OTHER): Payer: No Typology Code available for payment source | Admitting: Adult Health

## 2022-10-17 ENCOUNTER — Other Ambulatory Visit (HOSPITAL_COMMUNITY): Payer: Self-pay

## 2022-10-17 ENCOUNTER — Encounter: Payer: Self-pay | Admitting: Adult Health

## 2022-10-17 DIAGNOSIS — G4726 Circadian rhythm sleep disorder, shift work type: Secondary | ICD-10-CM

## 2022-10-17 DIAGNOSIS — F411 Generalized anxiety disorder: Secondary | ICD-10-CM | POA: Diagnosis not present

## 2022-10-17 DIAGNOSIS — F4010 Social phobia, unspecified: Secondary | ICD-10-CM | POA: Diagnosis not present

## 2022-10-17 DIAGNOSIS — F330 Major depressive disorder, recurrent, mild: Secondary | ICD-10-CM

## 2022-10-17 DIAGNOSIS — F410 Panic disorder [episodic paroxysmal anxiety] without agoraphobia: Secondary | ICD-10-CM

## 2022-10-17 MED ORDER — BUPROPION HCL ER (XL) 150 MG PO TB24
150.0000 mg | ORAL_TABLET | Freq: Every day | ORAL | 1 refills | Status: DC
  Filled 2022-10-17: qty 90, 90d supply, fill #0
  Filled 2023-02-05: qty 90, 90d supply, fill #1

## 2022-10-17 MED ORDER — DULOXETINE HCL 60 MG PO CPEP
60.0000 mg | ORAL_CAPSULE | Freq: Every day | ORAL | 1 refills | Status: DC
  Filled 2022-10-17 – 2022-12-24 (×2): qty 90, 90d supply, fill #0
  Filled 2023-03-19: qty 90, 90d supply, fill #1

## 2022-10-17 MED ORDER — ARMODAFINIL 250 MG PO TABS
250.0000 mg | ORAL_TABLET | Freq: Every day | ORAL | 2 refills | Status: DC
  Filled 2022-10-17: qty 30, 30d supply, fill #0

## 2022-10-17 MED ORDER — BREXPIPRAZOLE 1 MG PO TABS
1.5000 mg | ORAL_TABLET | Freq: Every day | ORAL | 1 refills | Status: DC
  Filled 2022-10-17 – 2022-11-07 (×3): qty 135, 90d supply, fill #0
  Filled 2022-11-07: qty 45, 30d supply, fill #0
  Filled 2022-12-18: qty 135, 90d supply, fill #0
  Filled 2023-03-19: qty 135, 90d supply, fill #1

## 2022-10-17 MED ORDER — BUPROPION HCL ER (XL) 300 MG PO TB24
300.0000 mg | ORAL_TABLET | Freq: Every day | ORAL | 1 refills | Status: DC
  Filled 2022-10-17: qty 90, 90d supply, fill #0
  Filled 2022-12-30: qty 90, 90d supply, fill #1

## 2022-10-17 NOTE — Progress Notes (Signed)
Julie Webb [id number] [birth year] 31 y.o.  Subjective:   Patient ID:  Julie Webb is a 31 y.o. (DOB 03-31-1991) female.  Chief Complaint: No chief complaint on file.   HPI [address] presents to the office today for follow-up of SAD, panic disorder, GAD, MDD.  Describes mood today as "ok". Pleasant. Tearful. Mood symptoms - reports feeling depressed. Denies anxiety. Feels stressed. Reports irritability at times. Reporting increased situational stressors related to her mother. Mood fluctuates consistent. Stating "I'm doing better emotionally". Feels like medications are helpful. Family doing well. Stable interest and motivation. Taking medications as prescribed.  Energy levels very low. Active, does not have a regular exercise routine.  Enjoys some usual interests and activities. Married. Lives with husband and son. Spending time with family. Appetite adequate. Weight gain - 160 pounds. Sleeps well most nights. Averages 8 to 9 hours. Napping during the day. Planning to have a sleep study. Focus and concentration stable. Completing tasks. Managing aspects of household. Working full time ICU nurse - night shift.  Denies SI or HI.  Denies AH or VH. Denies self harm. Denies substance use.    [place] Office Visit from 09/02/2022 in [facility] at [place] Visit from 07/23/2021 in [place] at [facility]  Total GAD-7 Score 4 16      PHQ2-9    [place] Visit from 09/02/2022 in [place] [facility] [place] Visit from 07/23/2021 in [place] [facility] [place] Visit from 11/12/2017 in [place] at [facility]  PHQ-2 Total Score 4 4 0  PHQ-9 Total Score 15 16 --      Flowsheet Row ED from 06/02/2021 in [facility]  C-SSRS RISK CATEGORY No Risk        Review of  Systems:  Review of Systems  Musculoskeletal:  Negative for gait problem.  Neurological:  Negative for tremors.  Psychiatric/Behavioral:         Please refer to HPI    Medications: I have reviewed the patient's current medications.  Current Outpatient Medications  Medication Sig Dispense Refill   Armodafinil 250 MG tablet Take 1 tablet (250 mg total) by mouth daily. 30 tablet 2   brexpiprazole (REXULTI) 1 MG TABS tablet Take one and 1/2 tablets daily. 135 tablet 1   buPROPion (WELLBUTRIN XL) 150 MG 24 hr tablet Take 1 tablet (150 mg total) by mouth daily. 90 tablet 1   buPROPion (WELLBUTRIN XL) 300 MG 24 hr tablet Take 1 tablet (300 mg total) by mouth daily after breakfast 90 tablet 1   COVID-19 mRNA vaccine 2023-2024 (COMIRNATY) syringe Inject into the muscle. 0.3 mL 0   DULoxetine (CYMBALTA) 60 MG capsule Take 1 capsule (60 mg total) by mouth daily after breakfast. 90 capsule 1   hydrOXYzine (ATARAX) 25 MG tablet Take 1 tablet (25 mg total) by mouth 3 (three) times daily as needed for anxiety. 30 tablet 0   ondansetron (ZOFRAN-ODT) 4 MG disintegrating tablet Take 1 tablet (4 mg total) by mouth every 8 (eight) hours as needed for nausea or vomiting. 20 tablet 0   primidone (MYSOLINE) 50 MG tablet Take 0.5 tablets (25 mg total) by mouth at bedtime. 30 tablet 5   SUMAtriptan (IMITREX) 50 MG tablet Take 1 tablet (50 mg total) by mouth at the onset of a migraine. Repeat in 2 hours.  Max of 2 tablets in 24 hours. 12 tablet 6   No current facility-administered medications for this visit.    Medication Side Effects: None  Allergies:  Allergies  Allergen Reactions   Propranolol     Dizziness   Phenergan [Promethazine Hcl] Other (See Comments)    Restlessness w/ IV Phenergen. Probable mild EPS. Tolerates if taken w/ Benadryl.     Past Medical History:  Diagnosis Date   Anxiety    Depression    Family history of adverse reaction to anesthesia    mother- N/V    Glomerulonephritis,  poststreptococcal    age 50   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   Migraines    Vaginal Pap smear, abnormal     Past Medical History, Surgical history, Social history, and Family history were reviewed and updated as appropriate.   Please see review of systems for further details on the patient's review from today.   Objective:   Physical Exam:  There were no vitals taken for this visit.  Physical Exam Constitutional:      General: She is not in acute distress. Musculoskeletal:        General: No deformity.  Neurological:     Mental Status: She is alert and oriented to person, place, and time.     Coordination: Coordination normal.  Psychiatric:        Attention and Perception: Attention and perception normal. She does not perceive auditory or visual hallucinations.        Mood and Affect: Mood normal. Mood is not anxious or depressed. Affect is not labile, blunt, angry or inappropriate.        Speech: Speech normal.        Behavior: Behavior normal.        Thought Content: Thought content normal. Thought content is not paranoid or delusional. Thought content does not include homicidal or suicidal ideation. Thought content does not include homicidal or suicidal plan.        Cognition and Memory: Cognition and memory normal.        Judgment: Judgment normal.     Comments: Insight intact     Lab Review:     Component Value Date/Time   NA 137 09/02/2022 1353   K 4.5 09/02/2022 1353   CL 103 09/02/2022 1353   CO2 29 09/02/2022 1353   GLUCOSE 85 09/02/2022 1353   BUN 11 09/02/2022 1353   CREATININE 0.77 09/02/2022 1353   CALCIUM 9.1 09/02/2022 1353   PROT 6.9 09/02/2022 1353   ALBUMIN 4.4 09/02/2022 1353   AST 16 09/02/2022 1353   ALT 16 09/02/2022 1353   ALKPHOS 63 09/02/2022 1353   BILITOT 0.3 09/02/2022 1353   GFRNONAA >60 11/28/2021 1330   GFRAA >60 06/05/2019 1928       Component Value Date/Time   WBC 5.6 09/02/2022 1353   RBC 4.67 09/02/2022 1353    HGB 13.9 09/02/2022 1353   HCT 42.2 09/02/2022 1353   PLT 231.0 09/02/2022 1353   MCV 90.4 09/02/2022 1353   MCH 30.6 11/28/2021 1330   MCHC 32.9 09/02/2022 1353   RDW 13.3 09/02/2022 1353   LYMPHSABS 1.4 09/02/2022 1353   MONOABS 0.4 09/02/2022 1353   EOSABS 0.0 09/02/2022 1353   BASOSABS 0.1 09/02/2022 1353    No results found for: "POCLITH", "LITHIUM"   No results found for: "PHENYTOIN", "PHENOBARB", "VALPROATE", "CBMZ"   .res Assessment: Plan:    Plan:  PDMP reviewed  Cymbalta '60mg'$  daily Wellbutrin  XL '450mg'$  daily - denies seizure history - move to morning Provigil '250mg'$  daily for shift work disorder - taking as needed   Increase Rexulti '1mg'$  to 1.'5mg'$  daily   RTC 3 months  Patient advised to contact office with any questions, adverse effects, or acute worsening in signs and symptoms.  Discussed potential metabolic side effects associated with atypical antipsychotics, as well as potential risk for movement side effects. Advised pt to contact office if movement side effects occur.   Diagnoses and all orders for this visit:  Mild recurrent major depression (HCC) -     buPROPion (WELLBUTRIN XL) 300 MG 24 hr tablet; Take 1 tablet (300 mg total) by mouth daily after breakfast -     buPROPion (WELLBUTRIN XL) 150 MG 24 hr tablet; Take 1 tablet (150 mg total) by mouth daily. -     DULoxetine (CYMBALTA) 60 MG capsule; Take 1 capsule (60 mg total) by mouth daily after breakfast. -     brexpiprazole (REXULTI) 1 MG TABS tablet; Take one and 1/2 tablets daily.  Shift work sleep disorder -     Armodafinil 250 MG tablet; Take 1 tablet (250 mg total) by mouth daily.  Generalized anxiety disorder -     buPROPion (WELLBUTRIN XL) 300 MG 24 hr tablet; Take 1 tablet (300 mg total) by mouth daily after breakfast -     buPROPion (WELLBUTRIN XL) 150 MG 24 hr tablet; Take 1 tablet (150 mg total) by mouth daily. -     DULoxetine (CYMBALTA) 60 MG capsule; Take 1 capsule (60 mg total) by mouth  daily after breakfast. -     brexpiprazole (REXULTI) 1 MG TABS tablet; Take one and 1/2 tablets daily.  Social anxiety disorder -     DULoxetine (CYMBALTA) 60 MG capsule; Take 1 capsule (60 mg total) by mouth daily after breakfast.  Panic disorder -     DULoxetine (CYMBALTA) 60 MG capsule; Take 1 capsule (60 mg total) by mouth daily after breakfast.     Please see After Visit Summary for patient specific instructions.  Future Appointments  Date Time Provider [place]  03/05/2023 10:30 AM Ward Givens, NP [facility] None    No orders of the defined types were placed in this encounter.   -------------------------------

## 2022-11-05 ENCOUNTER — Other Ambulatory Visit (HOSPITAL_COMMUNITY): Payer: Self-pay

## 2022-11-05 ENCOUNTER — Encounter: Payer: Self-pay | Admitting: Adult Health

## 2022-11-05 ENCOUNTER — Ambulatory Visit (INDEPENDENT_AMBULATORY_CARE_PROVIDER_SITE_OTHER): Payer: 59 | Admitting: Adult Health

## 2022-11-05 VITALS — BP 114/64 | HR 99 | Temp 98.2°F | Ht 63.5 in | Wt 167.6 lb

## 2022-11-05 DIAGNOSIS — R400 Somnolence: Secondary | ICD-10-CM

## 2022-11-05 NOTE — Assessment & Plan Note (Signed)
Snoring, restless sleep, daytime sleepiness, extreme fatigue all concerning for underlying sleep apnea.-Will set patient up for a home sleep study  Patient does have multiple medications that have sedating side effects.  This could also be contributing to her daytime sleepiness. Healthy sleep regimen discussed.  Daily exercise as able - discussed how weight can impact sleep and risk for sleep disordered breathing - discussed options to assist with weight loss: combination of diet modification, cardiovascular and strength training exercises   - had an extensive discussion regarding the adverse health consequences related to untreated sleep disordered breathing - specifically discussed the risks for hypertension, coronary artery disease, cardiac dysrhythmias, cerebrovascular disease, and diabetes - lifestyle modification discussed   - discussed how sleep disruption can increase risk of accidents, particularly when driving - safe driving practices were discussed   Plan  Patient Instructions  Set up for home sleep study  Work on healthy sleep regimen  Healthy weight loss  Do not drive if sleepy  Follow up in 3 months to discuss sleep study results and treatment plan

## 2022-11-05 NOTE — Progress Notes (Signed)
Reviewed and agree with assessment/plan.   Chesley Mires, MD [facility] Pulmonary/Critical Care 11/05/2022, 12:59 PM Pager:  [phone]

## 2022-11-05 NOTE — Progress Notes (Signed)
$'@Patient'w$  ID: Julie Webb, female    DOB: January 28, 1991, 32 y.o.   MRN: [id number]  Chief Complaint  Patient presents with   Consult    Referring provider: Debbrah Alar, NP  HPI: 32 year old female seen for sleep consult November 05, 2022 for snoring, daytime sleepiness and restless sleep.  TEST/EVENTS :   11/05/2022 Sleep Consult  Patient presents for a sleep consult.  Kindly referred by primary care provider Debbrah Alar, NP.  Patient complains that she is very tired and exhausted all the time.  She is very sleepy throughout the day.  Says she spends a lot of her time during the daytime sleeping.  She does have underlying anxiety and depression that is managed by psychiatry.  She is currently taking Wellbutrin, Rexulti and Cymbalta.  Patient does have an essential tremor and takes Mysoline.  Rarely uses hydroxyzine.  Does have chronic headaches. Patient is an Therapist, sports and works shift work.  She works Saturday and Sunday from [place] to [place].  During the week she returned to daily times schedule.  Typically goes to bed about 12 PM and gets up about 8 AM.  Takes about 30 minutes to go to sleep.  Is up a couple times throughout the night.  Patient says no matter how much she sleeps she always feels tired throughout the day.  Patient has been on Provigil for the last 2 years for daytime sleepiness by her psychiatrist.  She denies any history of congestive heart failure or stroke.  No symptoms suspicious for cataplexy or sleep paralysis.  Epworth score is 8 out of 24.  Typically gets sleepy if she sits down to watch TV and in the evening hours.  Caffeine intake is about 5 cups of coffee daily.  And occasional soda.  Patient says she has been under a lot of stress recently with family issues.  She has no dental work.  No history of congestive heart failure or stroke.  She has never had a sleep study before.  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  Surgical history cholecystectomy in 2021.  Social history patient is married.  She lives at home with her son and husband.  Her son is 50 years old.  She is a Equities trader.  Works at [employer].  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  She does not smoke.  No alcohol or drug use.  Family history positive for allergies, asthma, cancer.    Allergies  Allergen Reactions   Propranolol     Dizziness   Phenergan [Promethazine Hcl] Other (See Comments)    Restlessness w/ IV Phenergen. Probable mild EPS. Tolerates if taken w/ Benadryl.     Immunization History  Administered Date(s) Administered   COVID-19, mRNA, vaccine(Comirnaty)12 years and older 09/02/2022   DTaP 11/09/1991, 05/09/1992, 08/10/1992, 06/25/1993, 03/23/1996   HIB (PRP-OMP) 11/09/1991, 05/09/1992, 08/10/1992, 06/25/1993   HPV Quadrivalent 10/22/2006, 12/25/2006, 05/13/2007   Hepatitis A 10/22/2006, 05/13/2007   Hepatitis B 03/11/1994, 03/23/1996, 07/21/1996   Hepb-cpg 10/29/2021   Influenza,inj,Quad PF,6+ Mos 07/06/2015, 07/13/2017, 08/14/2022, 08/15/2022   Influenza-Unspecified 07/16/2012, 07/13/2018, 07/13/2021   MMR 06/25/1993, 03/23/1996   Meningococcal Conjugate 10/22/2006   PFIZER(Purple Top)SARS-COV-2 Vaccination 01/20/2020, 02/10/2020, 10/31/2020   PPD Test 03/30/2012, 04/06/2012   Td 05/13/2007, 12/04/2017   Tdap 05/13/2007    Past Medical History:  Diagnosis Date   Anxiety    Depression    Family history of adverse reaction to anesthesia    mother- N/V    Glomerulonephritis, poststreptococcal    age 46   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   Migraines    Vaginal Pap smear, abnormal     Tobacco History: Social History   Tobacco Use  Smoking Status Never  Smokeless Tobacco Never   Counseling given: Not Answered   Outpatient  Medications Prior to Visit  Medication Sig Dispense Refill   Armodafinil 250 MG tablet Take 1 tablet (250 mg total) by mouth daily. 30 tablet 2   brexpiprazole (REXULTI) 1 MG TABS tablet Take 1.5 tablets (1.5 mg total) by mouth daily. 135 tablet 1   buPROPion (WELLBUTRIN XL) 150 MG 24 hr tablet Take 1 tablet (150 mg total) by mouth daily. 90 tablet 1   buPROPion (WELLBUTRIN XL) 300 MG 24 hr tablet Take 1 tablet (300 mg total) by mouth daily after breakfast 90 tablet 1   COVID-19 mRNA vaccine 2023-2024 (COMIRNATY) syringe Inject into the muscle. 0.3 mL 0   DULoxetine (CYMBALTA) 60 MG capsule Take 1 capsule (60 mg total) by mouth daily after breakfast. 90 capsule 1   hydrOXYzine (ATARAX) 25 MG tablet Take 1 tablet (25 mg total) by mouth 3 (three) times daily as needed for anxiety. 30 tablet 0   ondansetron (ZOFRAN-ODT) 4 MG disintegrating tablet Take 1 tablet (4 mg total) by mouth every 8 (eight) hours as needed for nausea or vomiting. 20 tablet 0   primidone (MYSOLINE) 50 MG tablet Take 0.5 tablets (25 mg total) by mouth at bedtime. 30 tablet 5   SUMAtriptan (IMITREX) 50 MG tablet Take 1 tablet (50 mg total) by mouth at the onset of a migraine. Repeat in 2 hours. Max of 2 tablets in 24 hours. 12 tablet 6   No facility-administered medications prior to visit.     Review of Systems:   Constitutional:   No  weight loss, night sweats,  Fevers, chills,  +fatigue, or  lassitude.  HEENT:   No headaches,  Difficulty swallowing,  Tooth/dental problems, or  Sore throat,                No sneezing, itching, ear ache, nasal congestion, post nasal drip,   CV:  No chest pain,  Orthopnea, PND, swelling in lower extremities, anasarca, dizziness, palpitations, syncope.   GI  No heartburn, indigestion, abdominal pain, nausea, vomiting, diarrhea, change in bowel habits, loss of appetite, bloody stools.   Resp: No shortness of breath with exertion or at rest.  No excess mucus, no productive cough,  No  non-productive cough,  No coughing up of blood.  No change in color of mucus.  No wheezing.  No chest wall deformity  Skin: no rash or lesions.  GU: no dysuria, change in color of urine, no urgency or frequency.  No flank pain, no hematuria   MS:  No joint pain or swelling.  No decreased range of motion.  No back pain.    Physical Exam  BP 114/64 (BP Location: Left Arm, Patient Position: Sitting, Cuff Size: Normal)   Pulse 99   Temp 98.2 F (36.8 C) (Oral)   Ht 5' 3.5" (1.613 m)   Wt 167 lb 9.6 oz (76 kg)   SpO2 95%   BMI 29.22 kg/m   GEN: A/Ox3; pleasant , NAD, well nourished    HEENT:  [place]/AT,  NOSE-clear, THROAT-clear, no lesions, no  postnasal drip or exudate noted.  Class II-III MP airway  NECK:  Supple w/ fair ROM; no JVD; normal carotid impulses w/o bruits; no thyromegaly or nodules palpated; no lymphadenopathy.    RESP  Clear  P & A; w/o, wheezes/ rales/ or rhonchi. no accessory muscle use, no dullness to percussion  CARD:  RRR, no m/r/g, no peripheral edema, pulses intact, no cyanosis or clubbing.  GI:   Soft & nt; nml bowel sounds; no organomegaly or masses detected.   Musco: Warm bil, no deformities or joint swelling noted.   Neuro: alert, no focal deficits noted.    Skin: Warm, no lesions or rashes    Lab Results:  CBC   BMET   ProBNP No results found for: "PROBNP"  Imaging: No results found.        No data to display          No results found for: "NITRICOXIDE"      Assessment & Plan:   Daytime somnolence Snoring, restless sleep, daytime sleepiness, extreme fatigue all concerning for underlying sleep apnea.-Will set patient up for a home sleep study  Patient does have multiple medications that have sedating side effects.  This could also be contributing to her daytime sleepiness. Healthy sleep regimen discussed.  Daily exercise as able - discussed how weight can impact sleep and risk for sleep disordered breathing - discussed  options to assist with weight loss: combination of diet modification, cardiovascular and strength training exercises   - had an extensive discussion regarding the adverse health consequences related to untreated sleep disordered breathing - specifically discussed the risks for hypertension, coronary artery disease, cardiac dysrhythmias, cerebrovascular disease, and diabetes - lifestyle modification discussed   - discussed how sleep disruption can increase risk of accidents, particularly when driving - safe driving practices were discussed   Plan  Patient Instructions  Set up for home sleep study  Work on healthy sleep regimen  Healthy weight loss  Do not drive if sleepy  Follow up in 3 months to discuss sleep study results and treatment plan        Rexene Edison, NP 11/05/2022

## 2022-11-05 NOTE — Patient Instructions (Signed)
Set up for home sleep study  Work on healthy sleep regimen  Healthy weight loss  Do not drive if sleepy  Follow up in 3 months to discuss sleep study results and treatment plan

## 2022-11-05 NOTE — Addendum Note (Signed)
Addended by: Vanessa Barbara on: 11/05/2022 01:49 PM   Modules accepted: Orders

## 2022-11-06 ENCOUNTER — Other Ambulatory Visit (HOSPITAL_COMMUNITY): Payer: Self-pay

## 2022-11-07 ENCOUNTER — Telehealth: Payer: Self-pay

## 2022-11-07 ENCOUNTER — Other Ambulatory Visit (HOSPITAL_COMMUNITY): Payer: Self-pay

## 2022-11-07 NOTE — Telephone Encounter (Signed)
Prior Authorization initiated for Rexulti 1 mg 1.5 tablets daily with [employer], pending response

## 2022-11-09 NOTE — Telephone Encounter (Signed)
Prior Approval received for Rexulti 1 mg #45/30 day effective 11/07/2022-11/07/2023 with [employer].

## 2022-11-10 NOTE — Telephone Encounter (Signed)
Noted. Ty!

## 2022-11-14 ENCOUNTER — Other Ambulatory Visit (HOSPITAL_COMMUNITY): Payer: Self-pay

## 2022-11-14 ENCOUNTER — Ambulatory Visit (INDEPENDENT_AMBULATORY_CARE_PROVIDER_SITE_OTHER): Payer: 59 | Admitting: Adult Health

## 2022-11-14 ENCOUNTER — Encounter: Payer: Self-pay | Admitting: Adult Health

## 2022-11-14 DIAGNOSIS — F330 Major depressive disorder, recurrent, mild: Secondary | ICD-10-CM | POA: Diagnosis not present

## 2022-11-14 DIAGNOSIS — F410 Panic disorder [episodic paroxysmal anxiety] without agoraphobia: Secondary | ICD-10-CM

## 2022-11-14 DIAGNOSIS — F411 Generalized anxiety disorder: Secondary | ICD-10-CM

## 2022-11-14 DIAGNOSIS — G4726 Circadian rhythm sleep disorder, shift work type: Secondary | ICD-10-CM | POA: Diagnosis not present

## 2022-11-14 DIAGNOSIS — F4010 Social phobia, unspecified: Secondary | ICD-10-CM | POA: Diagnosis not present

## 2022-11-14 DIAGNOSIS — G4701 Insomnia due to medical condition: Secondary | ICD-10-CM | POA: Diagnosis not present

## 2022-11-14 MED ORDER — HYDROXYZINE HCL 25 MG PO TABS
25.0000 mg | ORAL_TABLET | Freq: Every day | ORAL | 5 refills | Status: DC
  Filled 2022-11-14: qty 30, 30d supply, fill #0

## 2022-11-14 NOTE — Progress Notes (Signed)
Julie Webb [id number] [birth year] 32 y.o.  Subjective:   Patient ID:  Julie Webb is a 32 y.o. (DOB May 27, 1991) female.  Chief Complaint: No chief complaint on file.   HPI [address] presents to the office today for follow-up of  SAD, panic disorder, GAD, MDD.  Describes mood today as "ok". Pleasant. Tearful. Mood symptoms - reports decreased depression. Reports anxiety and irritability - feels "stressed". Reports worry, rumination, and over thinking. Reporting ongoing situational stressors related to her mother. Mood fluctuates. Stating "I'm feeling better". Feels like increase in Rexulti has been helpful for mood. Has started a therapy group in [place] - 6 weeks.Feels like medications are helpful. Family doing well. Stable interest and motivation. Taking medications as prescribed.  Energy levels improved. Active, does not have a regular exercise routine.  Enjoys some usual interests and activities. Married. Lives with husband and son. Spending time with family. Appetite adequate. Weight gain - 160 pounds. Sleeps well most nights. Averages 8 to 9 hours. Napping during the day. Planning to have a sleep study. Focus and concentration stable. Completing tasks. Managing aspects of household. Working full time ICU nurse - night shift.  Denies SI or HI.  Denies AH or VH. Denies self harm. Denies substance use.    [place] Office Visit from 09/02/2022 in [facility] at [place] Visit from 07/23/2021 in [facility] at [facility]  Total GAD-7 Score 4 16      PHQ2-9    [place] Office Visit from 09/02/2022 in [facility] Primary [facility] at [place] Visit from 07/23/2021 in [facility] at [place] Visit from 11/12/2017 in [facility] at [facility] Total Score 4 4 0  PHQ-9 Total Score 15 16 --       [place] ED from 06/02/2021 in [facility] at [place] No Risk        Review of Systems:  Review of Systems  Musculoskeletal:  Negative for gait problem.  Neurological:  Negative for tremors.  Psychiatric/Behavioral:         Please refer to HPI    Medications: I have reviewed the patient's current medications.  Current Outpatient Medications  Medication Sig Dispense Refill   Armodafinil 250 MG tablet Take 1 tablet (250 mg total) by mouth daily. 30 tablet 2   brexpiprazole (REXULTI) 1 MG TABS tablet Take 1 and 1/2 tablets (1.5 mg total) by mouth daily. 135 tablet 1   buPROPion (WELLBUTRIN XL) 150 MG 24 hr tablet Take 1 tablet (150 mg total) by mouth daily. 90 tablet 1   buPROPion (WELLBUTRIN XL) 300 MG 24 hr tablet Take 1 tablet (300 mg total) by mouth daily after breakfast 90 tablet 1   COVID-19 mRNA vaccine 2023-2024 (COMIRNATY) syringe Inject into the muscle. 0.3 mL 0   DULoxetine (CYMBALTA) 60 MG capsule Take 1 capsule (60 mg total) by mouth daily after breakfast. 90 capsule 1   hydrOXYzine (ATARAX) 25 MG tablet Take 1 tablet (25 mg total) by mouth daily. 30 tablet 5   ondansetron (ZOFRAN-ODT) 4 MG disintegrating tablet Take 1 tablet (4 mg total) by mouth every 8 (eight) hours as needed for nausea or vomiting. 20 tablet 0   primidone (MYSOLINE) 50 MG tablet Take 0.5 tablets (25 mg total) by mouth at bedtime.  30 tablet 5   SUMAtriptan (IMITREX) 50 MG tablet Take 1 tablet (50 mg total) by mouth at the onset of a migraine. Repeat in 2 hours. Max of 2 tablets in 24 hours. 12 tablet 6   No current facility-administered medications for this visit.    Medication Side Effects: None  Allergies:  Allergies  Allergen Reactions   Propranolol     Dizziness   Phenergan [Promethazine Hcl] Other (See Comments)    Restlessness w/ IV Phenergen. Probable mild EPS. Tolerates if taken w/ Benadryl.     Past Medical History:   Diagnosis Date   Anxiety    Depression    Family history of adverse reaction to anesthesia    mother- N/V    Glomerulonephritis, poststreptococcal    age 33   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   Migraines    Vaginal Pap smear, abnormal     Past Medical History, Surgical history, Social history, and Family history were reviewed and updated as appropriate.   Please see review of systems for further details on the patient's review from today.   Objective:   Physical Exam:  There were no vitals taken for this visit.  Physical Exam Constitutional:      General: She is not in acute distress. Musculoskeletal:        General: No deformity.  Neurological:     Mental Status: She is alert and oriented to person, place, and time.     Coordination: Coordination normal.  Psychiatric:        Attention and Perception: Attention and perception normal. She does not perceive auditory or visual hallucinations.        Mood and Affect: Mood normal. Mood is not anxious or depressed. Affect is not labile, blunt, angry or inappropriate.        Speech: Speech normal.        Behavior: Behavior normal.        Thought Content: Thought content normal. Thought content is not paranoid or delusional. Thought content does not include homicidal or suicidal ideation. Thought content does not include homicidal or suicidal plan.        Cognition and Memory: Cognition and memory normal.        Judgment: Judgment normal.     Comments: Insight intact     Lab Review:     Component Value Date/Time   NA 137 09/02/2022 1353   K 4.5 09/02/2022 1353   CL 103 09/02/2022 1353   CO2 29 09/02/2022 1353   GLUCOSE 85 09/02/2022 1353   BUN 11 09/02/2022 1353   CREATININE 0.77 09/02/2022 1353   CALCIUM 9.1 09/02/2022 1353   PROT 6.9 09/02/2022 1353   ALBUMIN 4.4 09/02/2022 1353   AST 16 09/02/2022 1353   ALT 16 09/02/2022 1353   ALKPHOS 63 09/02/2022 1353   BILITOT 0.3 09/02/2022 1353   GFRNONAA >60  11/28/2021 1330   GFRAA >60 06/05/2019 1928       Component Value Date/Time   WBC 5.6 09/02/2022 1353   RBC 4.67 09/02/2022 1353   HGB 13.9 09/02/2022 1353   HCT 42.2 09/02/2022 1353   PLT 231.0 09/02/2022 1353   MCV 90.4 09/02/2022 1353   MCH 30.6 11/28/2021 1330   MCHC 32.9 09/02/2022 1353   RDW 13.3 09/02/2022 1353   LYMPHSABS 1.4 09/02/2022 1353   MONOABS 0.4 09/02/2022 1353   EOSABS 0.0 09/02/2022 1353   BASOSABS 0.1 09/02/2022 1353    No results found for: "POCLITH", "  LITHIUM"   No results found for: "PHENYTOIN", "PHENOBARB", "VALPROATE", "CBMZ"   .res Assessment: Plan:    Plan:  PDMP reviewed  Hydroxyzine '25mg'$  daily as needed for annxiety Cymbalta '60mg'$  daily Wellbutrin XL '450mg'$  daily - denies seizure history - move to morning Provigil '250mg'$  daily for shift work disorder - taking as needed  Rexulti 1.'5mg'$  daily  RTC 3 months  Patient advised to contact office with any questions, adverse effects, or acute worsening in signs and symptoms.  Discussed potential metabolic side effects associated with atypical antipsychotics, as well as potential risk for movement side effects. Advised pt to contact office if movement side effects occur.   Diagnoses and all orders for this visit:  Insomnia due to medical condition -     hydrOXYzine (ATARAX) 25 MG tablet; Take 1 tablet (25 mg total) by mouth daily.  Mild recurrent major depression ([place])  Generalized anxiety disorder  Shift work sleep disorder  Social anxiety disorder  Panic disorder     Please see After Visit Summary for patient specific instructions.  Future Appointments  Date Time Provider [place]  03/05/2023 10:30 AM Ward Givens, NP [facility] None    No orders of the defined types were placed in this encounter.   -------------------------------

## 2022-11-26 ENCOUNTER — Other Ambulatory Visit (HOSPITAL_COMMUNITY): Payer: Self-pay

## 2022-11-27 ENCOUNTER — Other Ambulatory Visit (HOSPITAL_COMMUNITY): Payer: Self-pay

## 2022-12-06 ENCOUNTER — Other Ambulatory Visit (HOSPITAL_COMMUNITY): Payer: Self-pay

## 2022-12-18 ENCOUNTER — Other Ambulatory Visit (HOSPITAL_COMMUNITY): Payer: Self-pay

## 2022-12-19 ENCOUNTER — Other Ambulatory Visit (HOSPITAL_COMMUNITY): Payer: Self-pay

## 2022-12-24 ENCOUNTER — Other Ambulatory Visit (HOSPITAL_COMMUNITY): Payer: Self-pay

## 2022-12-24 ENCOUNTER — Other Ambulatory Visit: Payer: Self-pay

## 2023-01-03 ENCOUNTER — Telehealth: Payer: 59 | Admitting: Family Medicine

## 2023-01-03 ENCOUNTER — Encounter: Payer: 59 | Admitting: Family Medicine

## 2023-01-03 ENCOUNTER — Other Ambulatory Visit: Payer: Self-pay

## 2023-01-03 DIAGNOSIS — B9689 Other specified bacterial agents as the cause of diseases classified elsewhere: Secondary | ICD-10-CM

## 2023-01-03 DIAGNOSIS — J019 Acute sinusitis, unspecified: Secondary | ICD-10-CM | POA: Diagnosis not present

## 2023-01-03 MED ORDER — AMOXICILLIN-POT CLAVULANATE 875-125 MG PO TABS
1.0000 | ORAL_TABLET | Freq: Two times a day (BID) | ORAL | 0 refills | Status: DC
  Filled 2023-01-03: qty 20, 10d supply, fill #0

## 2023-01-03 MED ORDER — AMOXICILLIN-POT CLAVULANATE 875-125 MG PO TABS: 1.0000 | ORAL_TABLET | Freq: Two times a day (BID) | ORAL | 0 refills | Status: DC

## 2023-01-03 NOTE — Progress Notes (Signed)
RX resent- no charge on this visit. DWB

## 2023-01-03 NOTE — Addendum Note (Signed)
Addended by: Dellia Nims on: 01/03/2023 10:33 AM   Modules accepted: Orders

## 2023-01-03 NOTE — Patient Instructions (Signed)

## 2023-01-03 NOTE — Progress Notes (Signed)
Virtual Visit Consent   Julie Webb, you are scheduled for a virtual visit with a [facility] provider today. Just as with appointments in the office, your consent must be obtained to participate. Your consent will be active for this visit and any virtual visit you may have with one of our providers in the next 365 days. If you have a MyChart account, a copy of this consent can be sent to you electronically.  As this is a virtual visit, video technology does not allow for your provider to perform a traditional examination. This may limit your provider's ability to fully assess your condition. If your provider identifies any concerns that need to be evaluated in person or the need to arrange testing (such as labs, EKG, etc.), we will make arrangements to do so. Although advances in technology are sophisticated, we cannot ensure that it will always work on either your end or our end. If the connection with a video visit is poor, the visit may have to be switched to a telephone visit. With either a video or telephone visit, we are not always able to ensure that we have a secure connection.  By engaging in this virtual visit, you consent to the provision of healthcare and authorize for your insurance to be billed (if applicable) for the services provided during this visit. Depending on your insurance coverage, you may receive a charge related to this service.  I need to obtain your verbal consent now. Are you willing to proceed with your visit today? Omni E Filyaw has provided verbal consent on 01/03/2023 for a virtual visit (video or telephone). Dellia Nims, FNP  Date: 01/03/2023 10:04 AM  Virtual Visit via Video Note   I, Dellia Nims, connected with  Julie Webb  ([id number], 05-28-1991) on 01/03/23 at 10:00 AM EDT by a video-enabled telemedicine application and verified that I am speaking with the correct person using two identifiers.  Location: Patient: Virtual Visit Location Patient:  Home Provider: Virtual Visit Location Provider: Home Office   I discussed the limitations of evaluation and management by telemedicine and the availability of in person appointments. The patient expressed understanding and agreed to proceed.    History of Present Illness: Julie Webb is a 32 y.o. who identifies as a female who was assigned female at birth, and is being seen today for sinus pain and pressure, post nasal drainage, headaches, green mucus for over a week worsening. No cough. No allergy sx. No fever. Julie Webb  HPI: HPI  Problems:  Patient Active Problem List   Diagnosis Date Noted   Daytime somnolence 09/02/2022   Essential tremor 01/17/2022   Chronic migraine w/o aura w/o status migrainosus, not intractable 01/17/2022   Benign essential tremor 11/19/2021   Neck pain 09/13/2021   Nevus 08/09/2021   Preventative health care 07/23/2021   Chronic cough 07/23/2021   Nonallopathic lesion of cervical region 12/08/2019   Nonallopathic lesion of thoracic region 12/08/2019   Encounter for planned induction of labor 11/23/2019   Nonallopathic lesion of rib cage 10/08/2019   Rib pain on right side 10/07/2019   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 Probable mild EPS. Tolerates if taken w/ Benadryl.    Medications:  Current Outpatient Medications:    amoxicillin-clavulanate (AUGMENTIN) 875-125 MG tablet, Take 1  tablet by mouth 2 (two) times daily., Disp: 20 tablet, Rfl: 0   Armodafinil 250 MG tablet, Take 1 tablet (250 mg total) by mouth daily., Disp: 30 tablet, Rfl: 2   brexpiprazole (REXULTI) 1 MG TABS tablet, Take 1 and 1/2 tablets (1.5 mg total) by mouth daily., Disp: 135 tablet, Rfl: 1   buPROPion (WELLBUTRIN XL) 150 MG 24 hr tablet, Take 1 tablet (150 mg total) by mouth daily., Disp: 90 tablet, Rfl: 1   buPROPion (WELLBUTRIN XL) 300 MG 24 hr tablet, Take 1 tablet (300 mg total) by mouth daily after breakfast, Disp: 90 tablet, Rfl: 1   COVID-19 mRNA vaccine 2023-2024 (COMIRNATY) syringe, Inject into the muscle., Disp: 0.3 mL, Rfl: 0   DULoxetine (CYMBALTA) 60 MG capsule, Take 1 capsule (60 mg total) by mouth daily after breakfast., Disp: 90 capsule, Rfl: 1   hydrOXYzine (ATARAX) 25 MG tablet, Take 1 tablet (25 mg total) by mouth daily., Disp: 30 tablet, Rfl: 5   ondansetron (ZOFRAN-ODT) 4 MG disintegrating tablet, Take 1 tablet (4 mg total) by mouth every 8 (eight) hours as needed for nausea or vomiting., Disp: 20 tablet, Rfl: 0   primidone (MYSOLINE) 50 MG tablet, Take 0.5 tablets (25 mg total) by mouth at bedtime., Disp: 30 tablet, Rfl: 5   SUMAtriptan (IMITREX) 50 MG tablet, Take 1 tablet (50 mg total) by mouth at the onset of a migraine. Repeat in 2 hours. Max of 2 tablets in 24 hours., Disp: 12 tablet, Rfl: 6  Observations/Objective: Patient is well-developed, well-nourished in no acute distress.  Resting comfortably  at home.  Head is normocephalic, atraumatic.  No labored breathing.  Speech is clear and coherent with logical content.  Patient is alert and oriented at baseline.    Assessment and Plan: 1. Acute bacterial sinusitis  Increase fluids, humidifier at night, ibuprofen as directed, allergy meds otc as needed. UC if sx worsen.   Follow Up Instructions: I discussed the assessment and treatment plan with the patient. The patient was provided an opportunity to ask questions and  all were answered. The patient agreed with the plan and demonstrated an understanding of the instructions.  A copy of instructions were sent to the patient via MyChart unless otherwise noted below.     The patient was advised to call back or seek an in-person evaluation if the symptoms worsen or if the condition fails to improve as anticipated.  Time:  I spent 10 minutes with the patient via telehealth technology discussing the above problems/concerns.    Dellia Nims, FNP

## 2023-01-05 ENCOUNTER — Other Ambulatory Visit (HOSPITAL_COMMUNITY): Payer: Self-pay

## 2023-01-27 ENCOUNTER — Telehealth: Payer: Self-pay | Admitting: Pulmonary Disease

## 2023-01-27 ENCOUNTER — Ambulatory Visit (INDEPENDENT_AMBULATORY_CARE_PROVIDER_SITE_OTHER): Payer: 59

## 2023-01-27 DIAGNOSIS — R0683 Snoring: Secondary | ICD-10-CM | POA: Diagnosis not present

## 2023-01-27 DIAGNOSIS — R400 Somnolence: Secondary | ICD-10-CM

## 2023-01-27 NOTE — Telephone Encounter (Signed)
Call patient  Sleep study result  Date of study: 01/04/2023  Impression: Negative study for significant sleep disordered breathing  No significant oxygen desaturations  Snoring  Recommendation:  May consider an in lab sleep study if significant concern persists regarding presence of significant obstructive sleep apnea  Negative home sleep study will suggest absence of moderate to severe obstructive sleep apnea  Optimize sleep hygiene  Follow-up as previously scheduled

## 2023-01-28 ENCOUNTER — Ambulatory Visit: Payer: No Typology Code available for payment source | Admitting: Adult Health

## 2023-01-28 NOTE — Telephone Encounter (Signed)
ATC X1 LVM for patient to call the office back. Please advise on sleep study results and schedule pt for a f/u with Dr. Val Eagle or and APP to go over results in person

## 2023-01-28 NOTE — Telephone Encounter (Signed)
Sleep study does not show any evidence of sleep apnea.  Please set up an office visit or virtual visit to discuss sleep study results   [facility]

## 2023-01-29 NOTE — Telephone Encounter (Signed)
Pt called the office and I went over the results of HST per TP. Pt has been scheduled for a f/u to further discuss. Nothing further needed.

## 2023-02-05 ENCOUNTER — Other Ambulatory Visit (HOSPITAL_COMMUNITY): Payer: Self-pay

## 2023-02-13 ENCOUNTER — Ambulatory Visit (INDEPENDENT_AMBULATORY_CARE_PROVIDER_SITE_OTHER): Payer: 59 | Admitting: Adult Health

## 2023-02-13 ENCOUNTER — Encounter: Payer: Self-pay | Admitting: Adult Health

## 2023-02-13 ENCOUNTER — Other Ambulatory Visit (HOSPITAL_COMMUNITY): Payer: Self-pay

## 2023-02-13 DIAGNOSIS — F330 Major depressive disorder, recurrent, mild: Secondary | ICD-10-CM | POA: Diagnosis not present

## 2023-02-13 DIAGNOSIS — F410 Panic disorder [episodic paroxysmal anxiety] without agoraphobia: Secondary | ICD-10-CM | POA: Diagnosis not present

## 2023-02-13 DIAGNOSIS — F411 Generalized anxiety disorder: Secondary | ICD-10-CM

## 2023-02-13 DIAGNOSIS — G4726 Circadian rhythm sleep disorder, shift work type: Secondary | ICD-10-CM | POA: Diagnosis not present

## 2023-02-13 DIAGNOSIS — F4010 Social phobia, unspecified: Secondary | ICD-10-CM

## 2023-02-13 MED ORDER — ARMODAFINIL 250 MG PO TABS
250.0000 mg | ORAL_TABLET | Freq: Every day | ORAL | 2 refills | Status: DC
  Filled 2023-02-13: qty 30, 30d supply, fill #0
  Filled 2023-04-07: qty 30, 30d supply, fill #1

## 2023-02-13 NOTE — Progress Notes (Signed)
Julie Webb [id number] [birth year] 31 y.o.  Subjective:   Patient ID:  Julie Webb is a 32 y.o. (DOB 17-Mar-1991) female.  Chief Complaint: No chief complaint on file.   HPI Julie Webb presents to the office today for follow-up of SAD, panic disorder, GAD, MDD.  Describes mood today as "ok". Pleasant. Tearful at times. Mood symptoms - reports decreased depression. Reports anxiety and irritability - "working on ways to manage it". Reports worry, rumination, and over thinking. Mood fluctuates - "not as bad". Feels like the increase in Rexulti from 1mg  to 1.5mg  daily has been helpful . Stating "I'm feeling better". Has finished therapy program. Family doing well. Plans to move into a new home - 2 doors down from current house. Stable interest and motivation. Taking medications as prescribed.  Energy levels varies with weather. Active, does not have a regular exercise routine.  Enjoys some usual interests and activities. Married. Lives with husband and son. Spending time with family. Appetite adequate. Weight gain - 160 pounds. Sleeps well most nights. Averages 8 to 9 hours. Napping during the day. Recent sleep study - awaiting results. Focus and concentration stable. Completing tasks. Managing aspects of household. 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  Denies AH or VH. Denies self harm. Denies substance use.   GAD-7    Flowsheet Row Office Visit from 09/02/2022 in [facility] at [facility] Office Visit from 07/23/2021 in [facility] at [facility]  Total GAD-7 Score 4 16      PHQ2-9    Flowsheet Row Office Visit from 09/02/2022 in [facility] at [facility] Office Visit from 07/23/2021 in [facility] at [facility] Office Visit from 11/12/2017 in [facility] at [facility]  Total Score 4 4 0  PHQ-9 Total Score 15 16 --      Flowsheet Row ED from 06/02/2021 in [facility] Emergency Department at [facility]  C-SSRS RISK CATEGORY No Risk        Review of Systems:  Review of Systems  Medications: I have reviewed the patient's current medications.  Current Outpatient Medications  Medication Sig Dispense Refill   amoxicillin-clavulanate (AUGMENTIN) 875-125 MG tablet Take 1 tablet by mouth 2 (two) times daily. 20 tablet 0   Armodafinil 250 MG tablet Take 1 tablet (250 mg total) by mouth daily. 30 tablet 2   brexpiprazole (REXULTI) 1 MG TABS tablet Take 1 and 1/2 tablets (1.5 mg total) by mouth daily. 135 tablet 1   buPROPion (WELLBUTRIN XL) 150 MG 24 hr tablet Take 1 tablet (150 mg total) by mouth daily. 90 tablet 1   buPROPion (WELLBUTRIN XL) 300 MG 24 hr tablet Take 1 tablet (300 mg total) by mouth daily after breakfast 90 tablet 1   COVID-19 mRNA vaccine 2023-2024 (COMIRNATY) syringe Inject into the muscle. 0.3 mL 0   DULoxetine (CYMBALTA) 60 MG capsule Take 1 capsule (60 mg total) by mouth daily after breakfast. 90 capsule 1   hydrOXYzine (ATARAX) 25 MG tablet Take 1 tablet (25 mg total) by mouth daily. 30 tablet 5   ondansetron (ZOFRAN-ODT) 4 MG disintegrating tablet Take 1 tablet (4 mg total) by mouth every 8 (eight) hours as needed for nausea or vomiting. 20 tablet 0   primidone (MYSOLINE) 50 MG tablet Take 0.5 tablets (25  mg total) by mouth at bedtime. 30 tablet 5   SUMAtriptan (IMITREX) 50 MG tablet Take 1 tablet (50 mg total) by mouth at the onset of a migraine. Repeat in 2 hours. Max of 2 tablets in 24 hours. 12 tablet 6   No current facility-administered medications for this visit.    Medication Side Effects: None  Allergies:  Allergies  Allergen Reactions   Propranolol     Dizziness   Phenergan [Promethazine Hcl] Other (See Comments)    Restlessness w/ IV Phenergen. Probable mild EPS. Tolerates if taken w/ Benadryl.     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   Please see review of systems for further details on the patient's review from today.   Objective:   Physical Exam:  There were no vitals taken for this visit.  Physical Exam  Lab Review:     Component Value Date/Time   NA 137 09/02/2022 1353   K 4.5 09/02/2022 1353   CL 103 09/02/2022 1353   CO2 29 09/02/2022 1353   GLUCOSE 85 09/02/2022 1353   BUN 11 09/02/2022 1353   CREATININE 0.77 09/02/2022 1353   CALCIUM 9.1 09/02/2022 1353   PROT 6.9 09/02/2022 1353   ALBUMIN 4.4 09/02/2022 1353   AST 16 09/02/2022 1353   ALT 16 09/02/2022 1353   ALKPHOS 63 09/02/2022 1353   BILITOT 0.3 09/02/2022 1353   GFRNONAA >60 11/28/2021 1330   GFRAA >60 06/05/2019 1928       Component Value Date/Time   WBC 5.6 09/02/2022 1353   RBC 4.67 09/02/2022 1353   HGB 13.9 09/02/2022 1353   HCT 42.2 09/02/2022 1353   PLT 231.0 09/02/2022 1353   MCV 90.4 09/02/2022 1353   MCH 30.6 11/28/2021 1330   MCHC 32.9 09/02/2022 1353   RDW 13.3 09/02/2022 1353   LYMPHSABS 1.4 09/02/2022 1353   MONOABS 0.4 09/02/2022 1353   EOSABS 0.0 09/02/2022 1353   BASOSABS 0.1 09/02/2022 1353    No results found for: "POCLITH", "LITHIUM"   No results found for: "PHENYTOIN", "PHENOBARB", "VALPROATE", "CBMZ"   .res Assessment: Plan:    Plan:  PDMP reviewed  Hydroxyzine 25mg  daily as needed for annxiety Cymbalta 60mg  daily Wellbutrin XL 450mg  daily - denies seizure history - move to morning Provigil 250mg  daily for shift work disorder  Rexulti 1.5mg  daily  RTC 3 months  Patient advised to contact office with any  questions, adverse effects, or acute worsening in signs and symptoms.  Discussed potential metabolic side effects associated with atypical antipsychotics, as well as potential risk for movement side effects. Advised pt to contact office if movement side effects occur.     Diagnoses and all orders for this visit:  Generalized anxiety disorder  Mild recurrent major depression (HCC)  Social anxiety disorder  Panic disorder  Shift work sleep disorder -     Armodafinil 250 MG tablet; Take 1 tablet (250 mg total) by mouth daily.     Please see After Visit Summary for patient specific instructions.  Future Appointments  Date Time Provider Department Center  02/17/2023 11:00  AM Parrett, Virgel Bouquet, NP [facility] None  03/05/2023 10:30 AM Butch Penny, NP [facility] None    No orders of the defined types were placed in this encounter.   -------------------------------

## 2023-02-17 ENCOUNTER — Ambulatory Visit: Payer: 59 | Admitting: Adult Health

## 2023-02-17 ENCOUNTER — Encounter: Payer: Self-pay | Admitting: Adult Health

## 2023-03-05 ENCOUNTER — Encounter: Payer: Self-pay | Admitting: Adult Health

## 2023-03-05 ENCOUNTER — Ambulatory Visit: Payer: No Typology Code available for payment source | Admitting: Adult Health

## 2023-03-05 ENCOUNTER — Ambulatory Visit: Payer: 59 | Admitting: Adult Health

## 2023-03-06 ENCOUNTER — Other Ambulatory Visit (HOSPITAL_COMMUNITY): Payer: Self-pay

## 2023-03-12 ENCOUNTER — Ambulatory Visit (INDEPENDENT_AMBULATORY_CARE_PROVIDER_SITE_OTHER): Payer: 59 | Admitting: Family Medicine

## 2023-03-12 ENCOUNTER — Other Ambulatory Visit (HOSPITAL_COMMUNITY): Payer: Self-pay

## 2023-03-12 ENCOUNTER — Encounter: Payer: Self-pay | Admitting: Family Medicine

## 2023-03-12 VITALS — BP 113/77 | HR 82 | Ht 63.5 in | Wt 167.0 lb

## 2023-03-12 DIAGNOSIS — R1012 Left upper quadrant pain: Secondary | ICD-10-CM

## 2023-03-12 DIAGNOSIS — G8929 Other chronic pain: Secondary | ICD-10-CM

## 2023-03-12 DIAGNOSIS — R110 Nausea: Secondary | ICD-10-CM

## 2023-03-12 MED ORDER — ONDANSETRON 4 MG PO TBDP
4.0000 mg | ORAL_TABLET | Freq: Three times a day (TID) | ORAL | 0 refills | Status: DC | PRN
Start: 2023-03-12 — End: 2023-12-25
  Filled 2023-03-12: qty 20, 7d supply, fill #0

## 2023-03-12 NOTE — Progress Notes (Signed)
Acute Office Visit  Subjective:     Patient ID: Julie Webb, female    DOB: August 20, 1991, 32 y.o.   MRN: [id number]  Chief Complaint  Patient presents with   GI Problem    HPI Patient is in today for GI symptoms.   Patient reports that for the past several months she has been experience epigastric and LUQ abdominal discomfort after meals .Initially it would only happen occasionally, but now she will have symptoms after eating or drinking anything including water. Reports that within about 10 minutes of eating/drinking she will have 4/10 dull, nagging abdominal pain (epigastric, LUQ) associated with nausea and occasional vomiting and dizziness. Symptoms will last for about an hour before easing up. States that in between meals/episodes she usually feels fine. Reports she does have some constipation at baseline with BMs every 2-3 days (LBM was normal yesterday). She denies any diarrhea, blood in stool, urinary changes, fevers, chills, body aches, rashes, reflux. She does have a history of anxiety and depression, but states this is the best she's felt mentally in many years, so she does not think mood is contributing. Reports she had her  gallbladder removed in December 2021.       ROS All review of systems negative except what is listed in the HPI      Objective:    BP 113/77   Pulse 82   Ht 5' 3.5" (1.613 m)   Wt 167 lb (75.8 kg)   SpO2 98%   BMI 29.12 kg/m    Physical Exam Vitals reviewed.  Constitutional:      Appearance: Normal appearance.  Cardiovascular:     Rate and Rhythm: Normal rate and regular rhythm.     Pulses: Normal pulses.     Heart sounds: Normal heart sounds.  Pulmonary:     Effort: Pulmonary effort is normal.     Breath sounds: Normal breath sounds.  Abdominal:     General: There is no distension.     Palpations: There is no mass.     Tenderness: There is no abdominal tenderness. There is no guarding or rebound.     Hernia: No hernia is present.   Skin:    General: Skin is warm and dry.  Neurological:     Mental Status: She is alert and oriented to person, place, and time.  Psychiatric:        Mood and Affect: Mood normal.        Behavior: Behavior normal.        Thought Content: Thought content normal.        Judgment: Judgment normal.     No results found for any visits on 03/12/23.      Assessment & Plan:   Problem List Items Addressed This Visit   None Visit Diagnoses     Chronic LUQ pain    -  Primary   Relevant Medications   ondansetron (ZOFRAN-ODT) 4 MG disintegrating tablet   Other Relevant Orders   CBC with Differential/Platelet   Comprehensive metabolic panel   Lipase   H. pylori antibody, IgG   Ambulatory referral to Gastroenterology   Nausea       Relevant Medications   ondansetron (ZOFRAN-ODT) 4 MG disintegrating tablet   Other Relevant Orders   CBC with Differential/Platelet   Comprehensive metabolic panel   Lipase   H. pylori antibody, IgG   Ambulatory referral to Gastroenterology     Starting with general labs and h pylori testing today  Given duration, she would like to go ahead and proceed with imaging and GI referral Will update with results Patient aware of signs/symptoms requiring further/urgent evaluation.      Meds ordered this encounter  Medications   ondansetron (ZOFRAN-ODT) 4 MG disintegrating tablet    Sig: Take 1 tablet (4 mg total) by mouth every 8 (eight) hours as needed for nausea or vomiting.    Dispense:  20 tablet    Refill:  0    Order Specific Question:   Supervising Provider    Answer:   Danise Edge A [[id number]]    Return if symptoms worsen or fail to improve.  Clayborne Dana, NP

## 2023-03-12 NOTE — Patient Instructions (Signed)
Starting with lab workup and CT scan given duration of symptoms - we will let you know as results come back  Referral sent to GI - they will be calling you to schedule Refilling your Zofran for nausea

## 2023-03-13 LAB — COMPREHENSIVE METABOLIC PANEL
ALT: 13 U/L (ref 0–35)
AST: 14 U/L (ref 0–37)
Albumin: 4.2 g/dL (ref 3.5–5.2)
Alkaline Phosphatase: 66 U/L (ref 39–117)
BUN: 12 mg/dL (ref 6–23)
CO2: 28 mEq/L (ref 19–32)
Calcium: 9.4 mg/dL (ref 8.4–10.5)
Chloride: 104 mEq/L (ref 96–112)
Creatinine, Ser: 0.9 mg/dL (ref 0.40–1.20)
GFR: 85.09 mL/min (ref >60.00)
Glucose, Bld: 85 mg/dL (ref 70–99)
Potassium: 4.8 mEq/L (ref 3.5–5.1)
Sodium: 140 mEq/L (ref 135–145)
Total Bilirubin: 0.4 mg/dL (ref 0.2–1.2)
Total Protein: 6.9 g/dL (ref 6.0–8.3)

## 2023-03-13 LAB — CBC WITH DIFFERENTIAL/PLATELET
Basophils Absolute: 0.1 10*3/uL (ref 0.0–0.1)
Basophils Relative: 1.2 % (ref 0.0–3.0)
Eosinophils Absolute: 0.1 10*3/uL (ref 0.0–0.7)
Eosinophils Relative: 2.1 % (ref 0.0–5.0)
HCT: 43.1 % (ref 36.0–46.0)
Hemoglobin: 14.2 g/dL (ref 12.0–15.0)
Lymphocytes Relative: 36 % (ref 12.0–46.0)
Lymphs Abs: 1.6 10*3/uL (ref 0.7–4.0)
MCHC: 32.9 g/dL (ref 30.0–36.0)
MCV: 89.7 fl (ref 78.0–100.0)
Monocytes Absolute: 0.4 10*3/uL (ref 0.1–1.0)
Monocytes Relative: 8.5 % (ref 3.0–12.0)
Neutro Abs: 2.3 10*3/uL (ref 1.4–7.7)
Neutrophils Relative %: 52.2 % (ref 43.0–77.0)
Platelets: 266 10*3/uL (ref 150.0–400.0)
RBC: 4.81 Mil/uL (ref 3.87–5.11)
RDW: 13.1 % (ref 11.5–15.5)
WBC: 4.5 10*3/uL (ref 4.0–10.5)

## 2023-03-13 LAB — LIPASE: Lipase: 23 U/L (ref 11.0–59.0)

## 2023-03-13 LAB — H. PYLORI ANTIBODY, IGG: H Pylori IgG: NEGATIVE

## 2023-03-14 ENCOUNTER — Telehealth: Payer: 59 | Admitting: Family

## 2023-03-14 DIAGNOSIS — R1012 Left upper quadrant pain: Secondary | ICD-10-CM | POA: Diagnosis not present

## 2023-03-14 DIAGNOSIS — G8929 Other chronic pain: Secondary | ICD-10-CM

## 2023-03-14 DIAGNOSIS — R110 Nausea: Secondary | ICD-10-CM

## 2023-03-14 MED ORDER — PROMETHAZINE HCL 25 MG PO TABS
25.0000 mg | ORAL_TABLET | Freq: Three times a day (TID) | ORAL | 0 refills | Status: AC | PRN
Start: 2023-03-14 — End: ?

## 2023-03-14 NOTE — Progress Notes (Signed)
Virtual Visit Consent   Julie Webb, you are scheduled for a virtual visit with a [facility] provider today. Just as with appointments in the office, your consent must be obtained to participate. Your consent will be active for this visit and any virtual visit you may have with one of our providers in the next 365 days. If you have a MyChart account, a copy of this consent can be sent to you electronically.  As this is a virtual visit, video technology does not allow for your provider to perform a traditional examination. This may limit your provider's ability to fully assess your condition. If your provider identifies any concerns that need to be evaluated in person or the need to arrange testing (such as labs, EKG, etc.), we will make arrangements to do so. Although advances in technology are sophisticated, we cannot ensure that it will always work on either your end or our end. If the connection with a video visit is poor, the visit may have to be switched to a telephone visit. With either a video or telephone visit, we are not always able to ensure that we have a secure connection.  By engaging in this virtual visit, you consent to the provision of healthcare and authorize for your insurance to be billed (if applicable) for the services provided during this visit. Depending on your insurance coverage, you may receive a charge related to this service.  I need to obtain your verbal consent now. Are you willing to proceed with your visit today? Julie Webb has provided verbal consent on 03/14/2023 for a virtual visit (video or telephone). Jannifer Rodney, FNP  Date: 03/14/2023 5:09 PM  Virtual Visit via Video Note   I, Jannifer Rodney, connected with  Julie Webb  ([id number], 1991/05/28) on 03/14/23 at  5:00 PM EDT by a video-enabled telemedicine application and verified that I am speaking with the correct person using two identifiers.  Location: Patient: Virtual Visit Location Patient:  Home Provider: Virtual Visit Location Provider: Home Office   I discussed the limitations of evaluation and management by telemedicine and the availability of in person appointments. The patient expressed understanding and agreed to proceed.    History of Present Illness: Julie Webb is a 32 y.o. who identifies as a female who was assigned female at birth, and is being seen today for nausea and chronic LUQ. She saw her PCP on 03/12/23 and had negative labs. Has a CT pending and GI referral pending. Was given zofran, but is not working. Has tried phenergan in the past with success.   HPI: Abdominal Pain This is a chronic problem. The current episode started more than 1 month ago. The problem occurs intermittently. The pain is located in the LUQ. The pain is at a severity of 4/10. The pain is moderate. Associated symptoms include nausea. Pertinent negatives include no belching, constipation, diarrhea, dysuria, fever, frequency, headaches, hematochezia, hematuria or vomiting. The pain is aggravated by eating. The treatment provided no relief.    Problems:  Patient Active Problem List   Diagnosis Date Noted   Daytime somnolence 09/02/2022   Essential tremor 01/17/2022   Chronic migraine w/o aura w/o status migrainosus, not intractable 01/17/2022   Benign essential tremor 11/19/2021   Neck pain 09/13/2021   Nevus 08/09/2021   Preventative health care 07/23/2021   Chronic cough 07/23/2021   Nonallopathic lesion of cervical region 12/08/2019   Nonallopathic lesion of thoracic region 12/08/2019   Encounter for planned induction of  labor 11/23/2019   Nonallopathic lesion of rib cage 10/08/2019   Rib pain on right side 10/07/2019   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 Probable mild EPS. Tolerates if taken w/ Benadryl.    Medications:  Current Outpatient Medications:    promethazine (PHENERGAN) 25 MG tablet, Take 1 tablet (25 mg total) by mouth every 8 (eight) hours as needed for nausea or vomiting., Disp: 20 tablet, Rfl: 0   Armodafinil 250 MG tablet, Take 1 tablet (250 mg total) by mouth daily., Disp: 30 tablet, Rfl: 2   brexpiprazole (REXULTI) 1 MG TABS tablet, Take 1 and 1/2 tablets (1.5 mg total) by mouth daily., Disp: 135 tablet, Rfl: 1   buPROPion (WELLBUTRIN XL) 150 MG 24 hr tablet, Take 1 tablet (150 mg total) by mouth daily., Disp: 90 tablet, Rfl: 1   buPROPion (WELLBUTRIN XL) 300 MG 24 hr tablet, Take 1 tablet (300 mg total) by mouth daily after breakfast, Disp: 90 tablet, Rfl: 1   DULoxetine (CYMBALTA) 60 MG capsule, Take 1 capsule (60 mg total) by mouth daily after breakfast., Disp: 90 capsule, Rfl: 1   hydrOXYzine (ATARAX) 25 MG tablet, Take 1 tablet (25 mg total) by mouth daily., Disp: 30 tablet, Rfl: 5   ondansetron (ZOFRAN-ODT) 4 MG disintegrating tablet, Dissolve 1 tablet (4 mg total) in mouth every 8 (eight) hours as needed for nausea or vomiting., Disp: 20 tablet, Rfl: 0   primidone (MYSOLINE) 50 MG tablet, Take 0.5 tablets (25 mg total) by mouth at bedtime., Disp: 30 tablet, Rfl: 5   SUMAtriptan (IMITREX) 50 MG tablet, Take 1 tablet (50 mg total) by mouth at the onset of a migraine. Repeat in 2 hours. Max of 2 tablets in 24 hours., Disp: 12 tablet, Rfl: 6  Observations/Objective: Patient is well-developed, well-nourished in no acute distress.  Resting comfortably  at home.  Head is  normocephalic, atraumatic.  No labored breathing.  Speech is clear and coherent with logical content.  Patient is alert and oriented at baseline.    Assessment and Plan: 1. Nausea - promethazine (PHENERGAN) 25 MG tablet; Take 1 tablet (25 mg total) by mouth every 8 (eight) hours as needed for nausea or vomiting.  Dispense: 20 tablet; Refill: 0  2. Chronic LUQ pain - promethazine (PHENERGAN) 25 MG tablet; Take 1 tablet (25 mg total) by mouth every 8 (eight) hours as needed for nausea or vomiting.  Dispense: 20 tablet; Refill: 0  Keep follow up GI Keep CT appt Phenergan as needed- discussed sedation precautions (only IV phenergan makes her restless) Bland diet  Force fluids Follow up with PCP  Follow Up Instructions: I discussed the assessment and treatment plan with the patient. The patient was provided an opportunity to ask questions and all were answered. The patient agreed with the plan and demonstrated an understanding of the instructions.  A copy of instructions were sent to the patient via MyChart unless otherwise noted below.  The patient was advised to call back or seek an in-person evaluation if the symptoms worsen or if the condition fails to improve as anticipated.  Time:  I spent 12 minutes with the patient via telehealth technology discussing the above problems/concerns.    Jannifer Rodney, FNP

## 2023-03-22 ENCOUNTER — Encounter (HOSPITAL_BASED_OUTPATIENT_CLINIC_OR_DEPARTMENT_OTHER): Payer: Self-pay

## 2023-03-22 ENCOUNTER — Emergency Department (HOSPITAL_BASED_OUTPATIENT_CLINIC_OR_DEPARTMENT_OTHER)
Admission: EM | Admit: 2023-03-22 | Discharge: 2023-03-22 | Disposition: A | Discharge status: Home / Self Care | Payer: 59 | Attending: Emergency Medicine | Admitting: Emergency Medicine

## 2023-03-22 ENCOUNTER — Emergency Department (HOSPITAL_BASED_OUTPATIENT_CLINIC_OR_DEPARTMENT_OTHER): Payer: 59

## 2023-03-22 ENCOUNTER — Other Ambulatory Visit: Payer: Self-pay

## 2023-03-22 DIAGNOSIS — K2970 Gastritis, unspecified, without bleeding: Secondary | ICD-10-CM | POA: Insufficient documentation

## 2023-03-22 DIAGNOSIS — R109 Unspecified abdominal pain: Secondary | ICD-10-CM | POA: Diagnosis not present

## 2023-03-22 DIAGNOSIS — R1013 Epigastric pain: Secondary | ICD-10-CM | POA: Diagnosis present

## 2023-03-22 DIAGNOSIS — R1110 Vomiting, unspecified: Secondary | ICD-10-CM | POA: Diagnosis not present

## 2023-03-22 LAB — CBC WITH DIFFERENTIAL/PLATELET
Abs Immature Granulocytes: 0.02 10*3/uL (ref 0.00–0.07)
Basophils Absolute: 0.1 10*3/uL (ref 0.0–0.1)
Basophils Relative: 1 %
Eosinophils Absolute: 0.1 10*3/uL (ref 0.0–0.5)
Eosinophils Relative: 1 %
HCT: 44 % (ref 36.0–46.0)
Hemoglobin: 14.7 g/dL (ref 12.0–15.0)
Immature Granulocytes: 0 %
Lymphocytes Relative: 26 %
Lymphs Abs: 1.6 10*3/uL (ref 0.7–4.0)
MCH: 29.9 pg (ref 26.0–34.0)
MCHC: 33.4 g/dL (ref 30.0–36.0)
MCV: 89.4 fL (ref 80.0–100.0)
Monocytes Absolute: 0.5 10*3/uL (ref 0.1–1.0)
Monocytes Relative: 7 %
Neutro Abs: 4.1 10*3/uL (ref 1.7–7.7)
Neutrophils Relative %: 65 %
Platelets: 247 10*3/uL (ref 150–400)
RBC: 4.92 MIL/uL (ref 3.87–5.11)
RDW: 12.4 % (ref 11.5–15.5)
WBC: 6.4 10*3/uL (ref 4.0–10.5)
nRBC: 0 % (ref 0.0–0.2)

## 2023-03-22 LAB — URINALYSIS, ROUTINE W REFLEX MICROSCOPIC
Bilirubin Urine: NEGATIVE
Glucose, UA: NEGATIVE mg/dL
Hgb urine dipstick: NEGATIVE
Ketones, ur: NEGATIVE mg/dL
Leukocytes,Ua: NEGATIVE
Nitrite: NEGATIVE
Protein, ur: NEGATIVE mg/dL
Specific Gravity, Urine: 1.01 (ref 1.005–1.030)
pH: 7 (ref 5.0–8.0)

## 2023-03-22 LAB — COMPREHENSIVE METABOLIC PANEL
ALT: 16 U/L (ref 0–44)
AST: 19 U/L (ref 15–41)
Albumin: 4 g/dL (ref 3.5–5.0)
Alkaline Phosphatase: 67 U/L (ref 38–126)
Anion gap: 7 (ref 5–15)
BUN: 8 mg/dL (ref 6–20)
CO2: 26 mmol/L (ref 22–32)
Calcium: 9.1 mg/dL (ref 8.9–10.3)
Chloride: 104 mmol/L (ref 98–111)
Creatinine, Ser: 0.82 mg/dL (ref 0.44–1.00)
GFR, Estimated: >60 mL/min (ref >60)
Glucose, Bld: 81 mg/dL (ref 70–99)
Potassium: 3.5 mmol/L (ref 3.5–5.1)
Sodium: 137 mmol/L (ref 135–145)
Total Bilirubin: 0.2 mg/dL — ABNORMAL LOW (ref 0.3–1.2)
Total Protein: 7.5 g/dL (ref 6.5–8.1)

## 2023-03-22 LAB — LIPASE, BLOOD: Lipase: 34 U/L (ref 11–51)

## 2023-03-22 LAB — PREGNANCY, URINE: Preg Test, Ur: NEGATIVE

## 2023-03-22 MED ORDER — PANTOPRAZOLE SODIUM 40 MG IV SOLR
40.0000 mg | Freq: Once | INTRAVENOUS | Status: AC
  Administered 2023-03-22: 40 mg via INTRAVENOUS
  Filled 2023-03-22: qty 10

## 2023-03-22 MED ORDER — LACTATED RINGERS IV BOLUS
1000.0000 mL | Freq: Once | INTRAVENOUS | Status: AC
  Administered 2023-03-22: 1000 mL via INTRAVENOUS

## 2023-03-22 MED ORDER — PANTOPRAZOLE SODIUM 40 MG PO TBEC
40.0000 mg | DELAYED_RELEASE_TABLET | Freq: Every day | ORAL | 1 refills | Status: DC
  Filled 2023-03-22: qty 30, 30d supply, fill #0
  Filled 2023-04-18: qty 30, 30d supply, fill #1

## 2023-03-22 MED ORDER — IOHEXOL 300 MG/ML  SOLN
100.0000 mL | Freq: Once | INTRAMUSCULAR | Status: AC | PRN
  Administered 2023-03-22: 100 mL via INTRAVENOUS

## 2023-03-22 MED ORDER — ALUM & MAG HYDROXIDE-SIMETH 200-200-20 MG/5ML PO SUSP
30.0000 mL | Freq: Once | ORAL | Status: AC
  Administered 2023-03-22: 30 mL via ORAL
  Filled 2023-03-22: qty 30

## 2023-03-22 MED ORDER — LIDOCAINE VISCOUS HCL 2 % MT SOLN
15.0000 mL | Freq: Once | OROMUCOSAL | Status: AC
  Administered 2023-03-22: 15 mL via ORAL
  Filled 2023-03-22: qty 15

## 2023-03-22 MED ORDER — MAALOX MAX 400-400-40 MG/5ML PO SUSP
5.0000 mL | Freq: Four times a day (QID) | ORAL | 0 refills | Status: DC | PRN
  Filled 2023-03-22: qty 355, 18d supply, fill #0

## 2023-03-22 NOTE — ED Triage Notes (Signed)
Patient having upper ABD pain after she eats for a month. She denied fever. She has had one episode of vomiting in that time. She saw her PCP for same two weeks.

## 2023-03-22 NOTE — ED Provider Notes (Signed)
[facility] Provider Note   CSN: [id number] Arrival date & time: 03/22/23  1742     History  Chief Complaint  Patient presents with   Abdominal Pain   Dizziness    Julie Webb is a 32 y.o. female.  32 year old female presents today for evaluation of left upper abdominal pain/epigastric abdominal pain that has been ongoing for couple months but worse over the past 2 weeks.  She states pain is worse after eating regardless of what she eats.  Denies any fever.  However she states these episodes she does get nauseous, and somewhat dizzy.  Denies any chest pain, shortness of breath.  No other complaints.  Denies dysuria, flank pain.  The history is provided by the patient. No language interpreter was used.       Home Medications Prior to Admission medications   Medication Sig Start Date End Date Taking? Authorizing Provider  Armodafinil 250 MG tablet Take 1 tablet (250 mg total) by mouth daily. 02/13/23   Mozingo, Thereasa Solo, NP  brexpiprazole (REXULTI) 1 MG TABS tablet Take 1 and 1/2 tablets (1.5 mg total) by mouth daily. 10/17/22   Mozingo, Thereasa Solo, NP  buPROPion (WELLBUTRIN XL) 150 MG 24 hr tablet Take 1 tablet (150 mg total) by mouth daily. 10/17/22   Mozingo, Thereasa Solo, NP  buPROPion (WELLBUTRIN XL) 300 MG 24 hr tablet Take 1 tablet (300 mg total) by mouth daily after breakfast 10/17/22   Mozingo, Thereasa Solo, NP  DULoxetine (CYMBALTA) 60 MG capsule Take 1 capsule (60 mg total) by mouth daily after breakfast. 10/17/22   Mozingo, Thereasa Solo, NP  hydrOXYzine (ATARAX) 25 MG tablet Take 1 tablet (25 mg total) by mouth daily. 11/14/22   Mozingo, Thereasa Solo, NP  ondansetron (ZOFRAN-ODT) 4 MG disintegrating tablet Dissolve 1 tablet (4 mg total) in mouth every 8 (eight) hours as needed for nausea or vomiting. 03/12/23   Clayborne Dana, NP  primidone (MYSOLINE) 50 MG tablet Take 0.5 tablets (25 mg total) by mouth  at bedtime. 08/21/22   Butch Penny, NP  promethazine (PHENERGAN) 25 MG tablet Take 1 tablet (25 mg total) by mouth every 8 (eight) hours as needed for nausea or vomiting. 03/14/23   Junie Spencer, FNP  SUMAtriptan (IMITREX) 50 MG tablet Take 1 tablet (50 mg total) by mouth at the onset of a migraine. Repeat in 2 hours. Max of 2 tablets in 24 hours. 01/17/22   Levert Feinstein, MD      Allergies    Propranolol and Phenergan [promethazine hcl]    Review of Systems   Review of Systems  Constitutional:  Negative for chills and fever.  Respiratory:  Negative for shortness of breath.   Cardiovascular:  Negative for chest pain.  Gastrointestinal:  Positive for abdominal pain and nausea. Negative for blood in stool, constipation and vomiting.  Genitourinary:  Negative for dysuria.  All other systems reviewed and are negative.   Physical Exam Updated Vital Signs BP 127/87 (BP Location: Left Arm)   Pulse 90   Temp 98.2 F (36.8 C) (Oral)   Resp 16   Ht 5\' 3"  (1.6 m)   Wt 75 kg   SpO2 99%   BMI 29.29 kg/m  Physical Exam Vitals and nursing note reviewed.  Constitutional:      General: She is not in acute distress.    Appearance: Normal appearance. She is not ill-appearing.  HENT:     Head: Normocephalic and  atraumatic.     Nose: Nose normal.  Eyes:     General: No scleral icterus.    Extraocular Movements: Extraocular movements intact.     Conjunctiva/sclera: Conjunctivae normal.  Cardiovascular:     Rate and Rhythm: Normal rate and regular rhythm.     Pulses: Normal pulses.     Heart sounds: Normal heart sounds.  Pulmonary:     Effort: Pulmonary effort is normal. No respiratory distress.     Breath sounds: Normal breath sounds. No wheezing or rales.  Abdominal:     General: There is no distension.     Palpations: Abdomen is soft.     Tenderness: There is no abdominal tenderness (Mild TTP over the epigastric region). There is no right CVA tenderness, left CVA tenderness or  guarding.  Musculoskeletal:        General: Normal range of motion.     Cervical back: Normal range of motion.  Skin:    General: Skin is warm and dry.  Neurological:     General: No focal deficit present.     Mental Status: She is alert. Mental status is at baseline.     ED Results / Procedures / Treatments   Labs (all labs ordered are listed, but only abnormal results are displayed) Labs Reviewed  CBC WITH DIFFERENTIAL/PLATELET  COMPREHENSIVE METABOLIC PANEL  LIPASE, BLOOD  URINALYSIS, ROUTINE W REFLEX MICROSCOPIC    EKG None  Radiology No results found.  Procedures Procedures    Medications Ordered in ED Medications  lactated ringers bolus 1,000 mL (has no administration in time range)  alum & mag hydroxide-simeth (MAALOX/MYLANTA) 200-200-20 MG/5ML suspension 30 mL (has no administration in time range)    And  lidocaine (XYLOCAINE) 2 % viscous mouth solution 15 mL (has no administration in time range)  pantoprazole (PROTONIX) injection 40 mg (has no administration in time range)    ED Course/ Medical Decision Making/ A&P                             Medical Decision Making Amount and/or Complexity of Data Reviewed Labs: ordered. Radiology: ordered.  Risk OTC drugs. Prescription drug management.   Medical Decision Making / ED Course   This patient presents to the ED for concern of abdominal pain, this involves an extensive number of treatment options, and is a complaint that carries with it a high risk of complications and morbidity.  The differential diagnosis includes gastritis, GERD, gastric ulcer, appendicitis, pancreatitis, pyelo, colitis  MDM: 32 year old female presents today for evaluation of upper abdominal pain ongoing for about a couple months but worse in the past 2 weeks.  Worse postprandial.  No fevers.  Occasionally gets nauseous but no emesis.  Denies any melanotic stools.  No hematemesis.  No associated anginal symptoms.  Without urinary  complaints.  Will obtain labs, CT imaging, and provide Protonix, GI cocktail and reevaluate.  CBC, CMP unremarkable.  UA without evidence of UTI.  Pregnancy test negative.  Lipase within normal limit.  CT abdomen pelvis without acute intra-abdominal or pelvic finding to explain patient's symptoms.  Likely gastritis given previous history of acid reflux and currently not on PPI.  Will prescribe Protonix, Maalox.  GI referral given.  Strict return precautions discussed at great suspicion for GI bleed.  Patient voices understanding and is in agreement with plan.   Lab Tests: -I ordered, reviewed, and interpreted labs.   The pertinent results include:  Labs Reviewed  COMPREHENSIVE METABOLIC PANEL - Abnormal; Notable for the following components:      Result Value   Total Bilirubin 0.2 (*)    All other components within normal limits  CBC WITH DIFFERENTIAL/PLATELET  LIPASE, BLOOD  URINALYSIS, ROUTINE W REFLEX MICROSCOPIC  PREGNANCY, URINE  PREGNANCY, URINE  URINALYSIS, ROUTINE W REFLEX MICROSCOPIC      EKG  EKG Interpretation  Date/Time:    Ventricular Rate:    PR Interval:    QRS Duration:   QT Interval:    QTC Calculation:   R Axis:     Text Interpretation:           Imaging Studies ordered: I ordered imaging studies including CT abdomen pelvis with contrast I independently visualized and interpreted imaging. I agree with the radiologist interpretation   Medicines ordered and prescription drug management: Meds ordered this encounter  Medications   lactated ringers bolus 1,000 mL   AND Linked Order Group    alum & mag hydroxide-simeth (MAALOX/MYLANTA) 200-200-20 MG/5ML suspension 30 mL    lidocaine (XYLOCAINE) 2 % viscous mouth solution 15 mL   pantoprazole (PROTONIX) injection 40 mg   iohexol (OMNIPAQUE) 300 MG/ML solution 100 mL   pantoprazole (PROTONIX) 40 MG tablet    Sig: Take 1 tablet (40 mg total) by mouth daily.    Dispense:  30 tablet    Refill:  1     Order Specific Question:   Supervising Provider    Answer:   MILLER, BRIAN [[id number]]   alum & mag hydroxide-simeth (MAALOX MAX) 400-400-40 MG/5ML suspension    Sig: Take 5 mLs by mouth every 6 (six) hours as needed for indigestion.    Dispense:  355 mL    Refill:  0    Order Specific Question:   Supervising Provider    Answer:   MILLER, BRIAN [[id number]]    -I have reviewed the patients home medicines and have made adjustments as needed   Reevaluation: After the interventions noted above, I reevaluated the patient and found that they have : Mild improvement.  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  Return precaution discussed.  Patient voices understanding and is in agreement with plan.  Final Clinical Impression(s) / ED Diagnoses Final diagnoses:  Gastritis, presence of bleeding unspecified, unspecified chronicity, unspecified gastritis type  Epigastric abdominal pain    Rx / DC Orders ED Discharge Orders          Ordered    pantoprazole (PROTONIX) 40 MG tablet  Daily        03/22/23 2035    alum & mag hydroxide-simeth (MAALOX MAX) 400-400-40 MG/5ML suspension  Every 6 hours PRN        03/22/23 2035              Marita Kansas, PA-C 03/22/23 2043    Vanetta Mulders, MD 03/24/23 1630

## 2023-03-22 NOTE — Discharge Instructions (Signed)
Your workup today was overall reassuring.  No concerning cause of your abdominal pain.  CT scan did not show any concerning findings.  I feel as if your pain may be related to gastritis, or gastric ulcer.  We have started you on medication called Protonix to take daily.  Maalox to take as needed.  Please follow-up with the gastroenterologist and your primary care provider.  If you have any concerning symptoms please return to the emergency room.

## 2023-03-23 ENCOUNTER — Other Ambulatory Visit: Payer: Self-pay

## 2023-03-23 ENCOUNTER — Ambulatory Visit: Payer: 59 | Admitting: Adult Health

## 2023-03-23 ENCOUNTER — Other Ambulatory Visit (HOSPITAL_COMMUNITY): Payer: Self-pay

## 2023-03-24 ENCOUNTER — Ambulatory Visit (INDEPENDENT_AMBULATORY_CARE_PROVIDER_SITE_OTHER): Payer: 59 | Admitting: Family

## 2023-03-24 VITALS — BP 128/83 | HR 102 | Temp 97.6°F | Resp 16 | Wt 168.0 lb

## 2023-03-24 DIAGNOSIS — R109 Unspecified abdominal pain: Secondary | ICD-10-CM

## 2023-03-24 DIAGNOSIS — K5900 Constipation, unspecified: Secondary | ICD-10-CM | POA: Diagnosis not present

## 2023-03-24 DIAGNOSIS — H8113 Benign paroxysmal vertigo, bilateral: Secondary | ICD-10-CM | POA: Diagnosis not present

## 2023-03-24 MED ORDER — SUCRALFATE 1 G PO TABS
1.0000 g | ORAL_TABLET | Freq: Three times a day (TID) | ORAL | 1 refills | Status: DC
  Filled 2023-03-24: qty 120, 30d supply, fill #0

## 2023-03-24 MED ORDER — MECLIZINE HCL 25 MG PO TABS
25.0000 mg | ORAL_TABLET | Freq: Three times a day (TID) | ORAL | 0 refills | Status: AC | PRN
Start: 2023-03-24 — End: ?
  Filled 2023-03-24: qty 30, 10d supply, fill #0

## 2023-03-24 NOTE — Progress Notes (Signed)
Subjective:     Patient ID: Julie Webb, female    DOB: [birth year], 32 y.o.   MRN: [id number]  Chief Complaint  Patient presents with   Abdominal Pain    Patient here for ed follow up, still having abdominal pain and nausea.     HPI  Discussed the use of AI scribe software for clinical note transcription with the patient, who gave verbal consent to proceed.  Pt's husband accompanies her to today's visit.   History of Present Illness   The patient, with a history of gallbladder removal, presents for follow-up after an ER visit due to worsening abdominal pain, nausea, and dizziness. These symptoms had been present for several months but had worsened in the two weeks prior to the ER visit. The patient describes the abdominal pain as a dull, achy sensation located in the upper abdomen. The pain is rated as 4 out of 10 and is sometimes triggered by eating. The patient also reports nausea and dizziness, which are more severe than the pain. The dizziness is positional and worsens with sudden head movements. The patient also reports constipation, with bowel movements occurring every three days. The patient has been trying to manage the constipation with enemas and is considering adding Metamucil to her regimen. The patient also reports a history of vertigo, which has worsened recently.          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   Migraines    Vaginal Pap smear, abnormal     Past Surgical History:  Procedure Laterality Date   CHOLECYSTECTOMY Right 09/19/2020    Family History  Problem Relation Age of Onset   Hyperlipidemia Mother    Mental illness Mother     Fibroids Mother        uterine   Hypertension Father    Heart attack Father    Heart disease Maternal Grandmother    Hyperlipidemia Maternal Grandmother    Hypertension Maternal Grandmother    Heart disease Maternal Grandfather    Hyperlipidemia Maternal Grandfather    Hypertension Maternal Grandfather    Pancreatic cancer Paternal Grandmother    Mesothelioma Paternal Grandfather     Social History   Socioeconomic History   Marital status: Married    Spouse name: Not on file   Number of children: Not on file   Years of education: Not on file   Highest education level: Bachelor's degree (e.g., BA, AB, BS)  Occupational History   Not on file  Tobacco Use   Smoking status: Never   Smokeless tobacco: Never  Vaping Use   Vaping Use: Never used  Substance and Sexual Activity   Alcohol use: Yes    Comment: occasional   Drug use: No   Sexual activity: Yes    Birth control/protection: I.U.D.  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   Has a son born 11/23/2019   Grew up in [place]   Enjoys reading   Right handed   Has an [place]  Bulldog   3 cups coffee per day, soda every other day      Social Determinants of Health   Financial Resource Strain: Patient Declined (03/24/2023)   Overall Financial Resource Strain (CARDIA)    Difficulty of Paying Living Expenses: Patient declined  Food Insecurity: Patient Declined (03/24/2023)   Hunger Vital Sign    Worried About Running Out of Food in the Last Year: Patient declined    Ran Out of Food in the Last Year: Patient declined  Transportation Needs: Patient Declined (03/24/2023)   PRAPARE - Administrator, Civil Service (Medical): Patient declined    Lack of Transportation (Non-Medical): Patient declined  Physical Activity: Insufficiently Active (03/24/2023)   Exercise Vital Sign    Days of Exercise per Week: 2 days    Minutes of Exercise per  Session: 30 min  Stress: Stress Concern Present (03/24/2023)   [employer] of Occupational Health - Occupational Stress Questionnaire    Feeling of Stress : Rather much  Social Connections: Unknown (03/24/2023)   Social Connection and Isolation Panel [NHANES]    Frequency of Communication with Friends and Family: Patient declined    Frequency of Social Gatherings with Friends and Family: Patient declined    Attends Religious Services: Patient declined    Database administrator or Organizations: Patient declined    Attends Engineer, structural: Not on file    Marital Status: Married  Catering manager Violence: Not on file    Outpatient Medications Prior to Visit  Medication Sig Dispense Refill   alum & mag hydroxide-simeth (MAALOX MAX) 400-400-40 MG/5ML suspension Take 5 mLs by mouth every 6 (six) hours as needed for indigestion. 355 mL 0   Armodafinil 250 MG tablet Take 1 tablet (250 mg total) by mouth daily. 30 tablet 2   brexpiprazole (REXULTI) 1 MG TABS tablet Take 1 and 1/2 tablets (1.5 mg total) by mouth daily. 135 tablet 1   buPROPion (WELLBUTRIN XL) 150 MG 24 hr tablet Take 1 tablet (150 mg total) by mouth daily. 90 tablet 1   buPROPion (WELLBUTRIN XL) 300 MG 24 hr tablet Take 1 tablet (300 mg total) by mouth daily after breakfast 90 tablet 1   DULoxetine (CYMBALTA) 60 MG capsule Take 1 capsule (60 mg total) by mouth daily after breakfast. 90 capsule 1   hydrOXYzine (ATARAX) 25 MG tablet Take 1 tablet (25 mg total) by mouth daily. 30 tablet 5   ondansetron (ZOFRAN-ODT) 4 MG disintegrating tablet Dissolve 1 tablet (4 mg total) in mouth every 8 (eight) hours as needed for nausea or vomiting. 20 tablet 0   pantoprazole (PROTONIX) 40 MG tablet Take 1 tablet (40 mg total) by mouth daily. 30 tablet 1   primidone (MYSOLINE) 50 MG tablet Take 0.5 tablets (25 mg total) by mouth at bedtime. 30 tablet 5   promethazine (PHENERGAN) 25 MG tablet Take 1 tablet (25 mg total) by mouth  every 8 (eight) hours as needed for nausea or vomiting. 20 tablet 0   SUMAtriptan (IMITREX) 50 MG tablet Take 1 tablet (50 mg total) by mouth at the onset of a migraine. Repeat in 2 hours. Max of 2 tablets in 24 hours. 12 tablet 6   No facility-administered medications prior to visit.    Allergies  Allergen Reactions   Propranolol     Dizziness   Phenergan [Promethazine Hcl] Other (See Comments)    Restlessness w/ IV Phenergen. Probable mild EPS. Tolerates if taken w/ Benadryl.  ROS See HPI    Objective:    Physical Exam Constitutional:      General: She is not in acute distress.    Appearance: Normal appearance. She is well-developed.  HENT:     Head: Normocephalic and atraumatic.     Right Ear: External ear normal.     Left Ear: External ear normal.  Eyes:     General: No scleral icterus. Neck:     Thyroid: No thyromegaly.  Cardiovascular:     Rate and Rhythm: Normal rate and regular rhythm.     Heart sounds: Normal heart sounds. No murmur heard. Pulmonary:     Effort: Pulmonary effort is normal. No respiratory distress.     Breath sounds: Normal breath sounds. No wheezing.  Abdominal:     Tenderness: There is abdominal tenderness in the epigastric area and left upper quadrant. There is no guarding.  Musculoskeletal:     Cervical back: Neck supple.  Skin:    General: Skin is warm and dry.  Neurological:     Mental Status: She is alert and oriented to person, place, and time.     Comments: Mildly positive [place] left  Psychiatric:        Mood and Affect: Mood normal. Affect is flat.        Behavior: Behavior normal.        Thought Content: Thought content normal.        Judgment: Judgment normal.      BP 128/83 (BP Location: Right Arm, Patient Position: Sitting, Cuff Size: Small)   Pulse (!) 102   Temp 97.6 F (36.4 C) (Oral)   Resp 16   Wt 168 lb (76.2 kg)   SpO2 100%   BMI 29.76 kg/m  Wt Readings from Last 3 Encounters:  03/24/23 168 lb  (76.2 kg)  03/22/23 165 lb 5.5 oz (75 kg)  03/12/23 167 lb (75.8 kg)       Assessment & Plan:   Problem List Items Addressed This Visit       Unprioritized   Constipation    Bowel movements every three days. Possible contribution to abdominal pain. -Start Metamucil once daily. -Increase water intake to at least 64 ounces daily. -Consider Miralax if no improvement with Metamucil. -Use Dulcolax for acute constipation as needed.      Benign paroxysmal positional vertigo due to bilateral vestibular disorder - Primary    Chronic dizziness worsened recently. Positive vertigo test during the visit. -Prescribe Meclizine three times a day as needed. -Refer to Physical Therapy for vestibular rehab.      Relevant Medications   meclizine (ANTIVERT) 25 MG tablet   Other Relevant Orders   Ambulatory referral to Physical Therapy   Abdominal pain    Chronic symptoms worsened over the past two weeks. Pain is worse after eating. CT abdomen/pelvis and labs were unremarkable. Possible gastritis or ulcer. -Continue Pantoprazole as prescribed by the ED. -Add Carafate three times a day with meals and at bedtime. -Consider diet modification to include easily digestible foods. -Refer to Gastroenterology (appointment already scheduled for 05/20/2023).       I am having Jaymee E. Margraf start on sucralfate and meclizine. I am also having her maintain her SUMAtriptan, primidone, buPROPion, buPROPion, DULoxetine, brexpiprazole, hydrOXYzine, Armodafinil, ondansetron, promethazine, pantoprazole, and Maalox Max.  Meds ordered this encounter  Medications   sucralfate (CARAFATE) 1 g tablet    Sig: Take 1 tablet (1 g total) by mouth 4 (four) times daily -  with meals and at bedtime.    Dispense:  120 tablet    Refill:  1    Order Specific Question:   Supervising Provider    Answer:   Danise Edge A [[id number]]   meclizine (ANTIVERT) 25 MG tablet    Sig: Take 1 tablet (25 mg total) by mouth 3 (three) times  daily as needed for dizziness.    Dispense:  30 tablet    Refill:  0    Order Specific Question:   Supervising Provider    Answer:   Danise Edge A [[id number]]

## 2023-03-25 ENCOUNTER — Other Ambulatory Visit (HOSPITAL_COMMUNITY): Payer: Self-pay

## 2023-03-27 DIAGNOSIS — R109 Unspecified abdominal pain: Secondary | ICD-10-CM | POA: Insufficient documentation

## 2023-03-27 DIAGNOSIS — K5900 Constipation, unspecified: Secondary | ICD-10-CM | POA: Insufficient documentation

## 2023-03-27 DIAGNOSIS — H8113 Benign paroxysmal vertigo, bilateral: Secondary | ICD-10-CM | POA: Insufficient documentation

## 2023-03-27 NOTE — Assessment & Plan Note (Addendum)
Bowel movements every three days. Possible contribution to abdominal pain. -Start Metamucil once daily. -Increase water intake to at least 64 ounces daily. -Consider Miralax if no improvement with Metamucil. -Use Dulcolax for acute constipation as needed.

## 2023-03-27 NOTE — Assessment & Plan Note (Signed)
Chronic dizziness worsened recently. Positive vertigo test during the visit. -Prescribe Meclizine three times a day as needed. -Refer to Physical Therapy for vestibular rehab.

## 2023-03-27 NOTE — Assessment & Plan Note (Signed)
Chronic symptoms worsened over the past two weeks. Pain is worse after eating. CT abdomen/pelvis and labs were unremarkable. Possible gastritis or ulcer. -Continue Pantoprazole as prescribed by the ED. -Add Carafate three times a day with meals and at bedtime. -Consider diet modification to include easily digestible foods. -Refer to Gastroenterology (appointment already scheduled for 05/20/2023).

## 2023-04-07 ENCOUNTER — Other Ambulatory Visit: Payer: Self-pay

## 2023-04-07 ENCOUNTER — Other Ambulatory Visit: Payer: Self-pay | Admitting: Adult Health

## 2023-04-07 DIAGNOSIS — F330 Major depressive disorder, recurrent, mild: Secondary | ICD-10-CM

## 2023-04-07 DIAGNOSIS — F411 Generalized anxiety disorder: Secondary | ICD-10-CM

## 2023-04-08 ENCOUNTER — Other Ambulatory Visit (HOSPITAL_COMMUNITY): Payer: Self-pay

## 2023-04-08 MED ORDER — BUPROPION HCL ER (XL) 300 MG PO TB24
300.0000 mg | ORAL_TABLET | Freq: Every day | ORAL | 1 refills | Status: DC
Start: 2023-04-08 — End: 2023-08-14
  Filled 2023-04-08: qty 90, 90d supply, fill #0
  Filled 2023-07-20: qty 90, 90d supply, fill #1

## 2023-04-15 ENCOUNTER — Ambulatory Visit: Payer: 59 | Admitting: Physical Therapy

## 2023-04-20 ENCOUNTER — Encounter: Payer: Self-pay | Admitting: Family

## 2023-04-20 DIAGNOSIS — R110 Nausea: Secondary | ICD-10-CM

## 2023-04-20 NOTE — Therapy (Signed)
OUTPATIENT PHYSICAL THERAPY VESTIBULAR EVALUATION     Patient Name: Julie Webb MRN: [id number] DOB:[birth year], 32 y.o., female Today's Date: 04/22/2023  END OF SESSION:  PT End of Session - 04/22/23 1025     Visit Number 1    Date for PT Re-Evaluation 06/03/23    Authorization Type [facility] [employer]    PT Start Time 1026    PT Stop Time 1108    PT Time Calculation (min) 42 min    Activity Tolerance Patient tolerated treatment well    Behavior During Therapy WFL for tasks assessed/performed             Past Medical History:  Diagnosis Date   Anxiety    Depression    Family history of adverse reaction to anesthesia    mother- N/V    Glomerulonephritis, poststreptococcal    age 66   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   Migraines    Vaginal Pap smear, abnormal    Past Surgical History:  Procedure Laterality Date   CHOLECYSTECTOMY Right 09/19/2020   Patient Active Problem List   Diagnosis Date Noted   Benign paroxysmal positional vertigo due to bilateral vestibular disorder 03/27/2023   Abdominal pain 03/27/2023   Constipation 03/27/2023   Daytime somnolence 09/02/2022   Essential tremor 01/17/2022   Chronic migraine w/o aura w/o status migrainosus, not intractable 01/17/2022   Benign essential tremor 11/19/2021   Neck pain 09/13/2021   Nevus 08/09/2021   Preventative health care 07/23/2021   Chronic cough 07/23/2021   Nonallopathic lesion of cervical region 12/08/2019   Nonallopathic lesion of thoracic region 12/08/2019   Encounter for planned induction of labor 11/23/2019   Nonallopathic lesion of rib cage 10/08/2019   Rib pain on right side 10/07/2019   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   Panic disorder 11/01/2018   Generalized anxiety disorder 11/01/2018   Chronic migraine 12/25/2017   Suicidal behavior with attempted self-injury (HCC)    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   Tremor 10/05/2015   Loss of weight 07/06/2015    Social anxiety disorder 05/30/2015   H/O acute post-streptococcal glomerulonephritis 05/30/2015   Contraceptive management 05/30/2015   Migraine 05/30/2015    PCP: Sandford Craze, NP   REFERRING PROVIDER: Sandford Craze, NP  REFERRING DIAG: H81.13 (ICD-10-CM) - Benign paroxysmal positional vertigo due to bilateral vestibular disorder   THERAPY DIAG:  BPPV (benign paroxysmal positional vertigo), left  Dizziness and giddiness  ONSET DATE: ED on 03/22/23  Rationale for Evaluation and Treatment: Rehabilitation  SUBJECTIVE:   SUBJECTIVE STATEMENT: Still feeling dizzy and nauseous, was supposed to see GI specialist but the provider quit, trying to get another appointment, its been going on since May.  The dizziness happens a lot when doing things, activities or bending over or looking up.  Quick movements.   Dr. Prescribed meclizine and has taken a couple of times when bad and seemed to help.  Pt accompanied by: self and husband in waiting area.   PERTINENT HISTORY: history of chronic dizziness, worsened recently, with mildly positive Left Gilberto Better on 03/24/23, h/o migraines, anxiety and depression.   PAIN:  Are you having pain? No  PRECAUTIONS: None  WEIGHT BEARING RESTRICTIONS: No  FALLS: Has patient fallen in last 6 months? 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   OBJECTIVE:   DIAGNOSTIC FINDINGS: DG cervical spine 09/10/2021 IMPRESSION: Straightening of the cervical spine.  Otherwise negative  COGNITION: Overall cognitive status: Within functional limits for tasks assessed   SENSATION: Denies numbness or parasthesias  POSTURE:  rounded shoulders  Cervical ROM:    Active AROM (deg) eval   Flexion 65  Extension 60  Right lateral flexion 35  Left lateral flexion 35  Right rotation 80  Left rotation 70  (Blank rows = not tested)  STRENGTH: deferred today   GAIT: Gait pattern: WFL  PATIENT SURVEYS:  DHI 44%  VESTIBULAR ASSESSMENT:  GENERAL OBSERVATION: no apparent distress.    SYMPTOM BEHAVIOR:  Subjective history: has had dizziness for years, even as a child, can't stay standing for a long time because she feels dizzy, would have to crouch down.  Has never participated in any activities or sports because of the dizziness.  Hobbies are sedentary - reading and video games, even avoids fast paced games due to dizziness.  Symptoms only relieved by lying down.  Has never discussed chronic dizziness symptoms with PCP or seen ENT.   Non-Vestibular symptoms: nausea/vomiting  Type of dizziness: Imbalance (Disequilibrium), Spinning/Vertigo, and like I'm on a boat  Frequency: daily for years  Duration: doesn't stop just varies in intensity  Aggravating factors: Induced by position change: sit to stand and quick head position changes  Relieving factors: lying supine when a child would squat down  Progression of symptoms: unchanged  OCULOMOTOR EXAM:  deferred    VESTIBULAR - OCULAR REFLEX:  deferred    POSITIONAL TESTING: Right Dix-Hallpike: no nystagmus Left Dix-Hallpike: no nystagmus Right Roll Test: no nystagmus Left Roll Test: geotropic nystagmus and symptoms worse on left side  MOTION SENSITIVITY:  Motion Sensitivity Quotient Intensity: 0 = none, 1 = Lightheaded, 2 = Mild, 3 = Moderate, 4 = Severe, 5 = Vomiting  Intensity  1. Sitting to supine   2. Supine to L side   3. Supine to R side   4. Supine to sitting 3  5. L Hallpike-Dix 0  6. Up from L  3  7. R Hallpike-Dix 0  8. Up from R  3  9. Sitting, head tipped to L knee   10. Head up from L knee   11. Sitting, head tipped to R knee   12. Head up from R knee   13. Sitting head turns x5   14.Sitting head  nods x5 Tilting head up mod dizziness  15. In stance, 180 turn to L    16. In stance, 180 turn to R     ORTHOSTATICS:  sitting 110/78, HR 98; standing 110/70 HR 109, dizzy.  Standing taken after 1 minute due to immediate increase in symptoms of dizziness.    VESTIBULAR TREATMENT:                                                                                                   DATE:   04/22/2023 Canalith Repositioning:  BBQ Roll Left: Number of Reps: 1  PATIENT EDUCATION: Education details: findings,  POC, education on BBQ roll Person educated: Patient Education method: Explanation, Demonstration, and Handouts Education comprehension: verbalized understanding  HOME EXERCISE PROGRAM: [email or web address]  GOALS: Goals reviewed with patient? Yes  SHORT TERM GOALS: Target date: 05/13/2023   Patient will report compliance with initial HEP.  Baseline: Goal status: INITIAL  2.  Patient will report resolution of dizziness rolling over in bed.  Baseline:  Goal status: INITIAL  3.  Patient will complete further assessment of VOR.  Baseline:  Goal status: INITIAL   LONG TERM GOALS: Target date: 06/03/2023    Patient will be independent with progressed HEP to improve outcomes and carryover.  Baseline:  Goal status: INITIAL  2.  Patient will report 50% improvement in dizziness with head movements. Baseline:  Goal status: INITIAL  3.  Patient will report 18 points improvement on DHI to demonstrate improved QOL. Baseline: 44% impairment Goal status: INITIAL    ASSESSMENT:  CLINICAL IMPRESSION: Patient is a 32 y.o. female who was seen today for physical therapy evaluation and treatment for L BPPV.  She demonstrated positive L roll test for L horizontal canalithiasis and was treated today with L BBQ roll.  Of note was that she has history of chronic dizziness and activity intolerance since she was a child, to point that any  prolonged standing would cause dizziness.  Today noted drop in both systolic and diastolic blood pressure with standing and elevated heart rate and increased dizziness after only 1 minute of standing, contacted her PCP as these symptoms are suggestive of postural tachycardia syndrome and warrant further testing.  Also noted significant ligament laxity in her wrists, although she dies not fit true hypermobility criteria on Beighton-Byrd scale.  Tashawna E Mirza would benefit from skilled physical therapy to decrease dizzyness to improve QOL and improve activity tolerance.     OBJECTIVE IMPAIRMENTS: decreased activity tolerance, dizziness, and postural dysfunction.   ACTIVITY LIMITATIONS: bending, standing, and locomotion level  PARTICIPATION LIMITATIONS: cleaning, laundry, community activity, and occupation  PERSONAL FACTORS: Fitness, Past/current experiences, and 3+ comorbidities: history migraines, chronic vertigo, GAD, depression, essential tremor  are also affecting patient's functional outcome.   REHAB POTENTIAL: Good  CLINICAL DECISION MAKING: Evolving/moderate complexity  EVALUATION COMPLEXITY: Moderate   PLAN:  PT FREQUENCY: 1-2x/week  PT DURATION: 6 weeks  PLANNED INTERVENTIONS: Therapeutic exercises, Therapeutic activity, Neuromuscular re-education, Balance training, Gait training, Patient/Family education, Self Care, Joint mobilization, Vestibular training, Canalith repositioning, Dry Needling, Electrical stimulation, Spinal mobilization, Cryotherapy, Moist heat, Taping, Traction, Ultrasound, Manual therapy, and Re-evaluation  PLAN FOR NEXT SESSION: check VOR and oculomotor, strength, balance, recheck canals.     Jena Gauss, PT 04/22/2023, 1:11 PM

## 2023-04-21 ENCOUNTER — Other Ambulatory Visit (HOSPITAL_COMMUNITY): Payer: Self-pay

## 2023-04-21 MED ORDER — SCOPOLAMINE 1 MG/3DAYS TD PT72
1.0000 | MEDICATED_PATCH | TRANSDERMAL | 0 refills | Status: AC
  Filled 2023-04-21: qty 4, 12d supply, fill #0

## 2023-04-21 MED ORDER — PANTOPRAZOLE SODIUM 40 MG PO TBEC
40.0000 mg | DELAYED_RELEASE_TABLET | Freq: Every day | ORAL | 5 refills | Status: DC
  Filled 2023-04-21: qty 30, 30d supply, fill #0
  Filled 2023-05-19: qty 30, 30d supply, fill #1
  Filled 2023-12-15: qty 30, 30d supply, fill #2

## 2023-04-21 MED ORDER — SUCRALFATE 1 G PO TABS
1.0000 g | ORAL_TABLET | Freq: Three times a day (TID) | ORAL | 5 refills | Status: DC
  Filled 2023-04-21: qty 100, 25d supply, fill #0
  Filled 2023-04-21: qty 20, 5d supply, fill #0
  Filled 2023-05-19: qty 120, 30d supply, fill #1
  Filled 2023-07-20 (×2): qty 120, 30d supply, fill #2
  Filled 2023-12-15: qty 120, 30d supply, fill #3

## 2023-04-22 ENCOUNTER — Telehealth: Payer: Self-pay | Admitting: Family

## 2023-04-22 ENCOUNTER — Encounter: Payer: Self-pay | Admitting: Physical Therapy

## 2023-04-22 ENCOUNTER — Ambulatory Visit: Payer: 59 | Attending: Family | Admitting: Physical Therapy

## 2023-04-22 DIAGNOSIS — H8112 Benign paroxysmal vertigo, left ear: Secondary | ICD-10-CM | POA: Diagnosis not present

## 2023-04-22 DIAGNOSIS — R42 Dizziness and giddiness: Secondary | ICD-10-CM | POA: Diagnosis not present

## 2023-04-22 DIAGNOSIS — H8113 Benign paroxysmal vertigo, bilateral: Secondary | ICD-10-CM | POA: Diagnosis not present

## 2023-04-22 NOTE — Telephone Encounter (Signed)
-----   Message from Jena Gauss, PT sent at 04/22/2023 12:33 PM EDT ----- Regarding: POTS? I just evaluated Julie Webb for BPPV.  Looks like she has L horizontal canalithiasis, but when I asked her about her history of chronic dizziness, she reported that this has been going on since she was a child whenever she stood for any length of time, she would have to drop down into a crouch.  She has never done any sore of physical activity because it would make her dizzy and sick feeling, and her symptoms only improve with laying down.  We did a quick blood pressure sitting and standing and her blood pressure immediately dropped (110/78 to 100/70), and her heart rate increased (98 to 109), and she got dizzy.  That was after only 1 minute!   I think she needs to be evaluated for POTS.  Unfortunately, she's been managing these symptoms for so long she hasn't even thought about talking to you about them.    Daryl Eastern, PT

## 2023-04-28 ENCOUNTER — Encounter: Payer: 59 | Admitting: Physical Therapy

## 2023-05-05 ENCOUNTER — Ambulatory Visit: Payer: 59 | Admitting: Physical Therapy

## 2023-05-05 ENCOUNTER — Encounter: Payer: Self-pay | Admitting: Physical Therapy

## 2023-05-05 DIAGNOSIS — H8113 Benign paroxysmal vertigo, bilateral: Secondary | ICD-10-CM | POA: Diagnosis not present

## 2023-05-05 DIAGNOSIS — H8112 Benign paroxysmal vertigo, left ear: Secondary | ICD-10-CM | POA: Diagnosis not present

## 2023-05-05 DIAGNOSIS — R42 Dizziness and giddiness: Secondary | ICD-10-CM | POA: Diagnosis not present

## 2023-05-05 NOTE — Therapy (Signed)
OUTPATIENT PHYSICAL THERAPY VESTIBULAR TREATMENT     Patient Name: Julie Webb MRN: [id number] DOB:August 03, 1991, 32 y.o., female Today's Date: 05/05/2023  END OF SESSION:  PT End of Session - 05/05/23 1024     Visit Number 2    Date for PT Re-Evaluation 06/03/23    Authorization Type [facility] [employer]    PT Start Time 1023    PT Stop Time 1057    PT Time Calculation (min) 34 min    Activity Tolerance Patient tolerated treatment well    Behavior During Therapy [facility] for tasks assessed/performed             Past Medical History:  Diagnosis Date   Anxiety    Depression    Family history of adverse reaction to anesthesia    mother- N/V    Glomerulonephritis, poststreptococcal    age 66   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   Migraines    Vaginal Pap smear, abnormal    Past Surgical History:  Procedure Laterality Date   CHOLECYSTECTOMY Right 09/19/2020   Patient Active Problem List   Diagnosis Date Noted   Benign paroxysmal positional vertigo due to bilateral vestibular disorder 03/27/2023   Abdominal pain 03/27/2023   Constipation 03/27/2023   Daytime somnolence 09/02/2022   Essential tremor 01/17/2022   Chronic migraine w/o aura w/o status migrainosus, not intractable 01/17/2022   Benign essential tremor 11/19/2021   Neck pain 09/13/2021   Nevus 08/09/2021   Preventative health care 07/23/2021   Chronic cough 07/23/2021   Nonallopathic lesion of cervical region 12/08/2019   Nonallopathic lesion of thoracic region 12/08/2019   Encounter for planned induction of labor 11/23/2019   Nonallopathic lesion of rib cage 10/08/2019   Rib pain on right side 10/07/2019   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   Panic disorder 11/01/2018   Generalized anxiety disorder 11/01/2018   Chronic migraine 12/25/2017   Suicidal behavior with attempted self-injury (HCC)    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   Tremor 10/05/2015   Loss of weight 07/06/2015    Social anxiety disorder 05/30/2015   H/O acute post-streptococcal glomerulonephritis 05/30/2015   Contraceptive management 05/30/2015   Migraine 05/30/2015    PCP: Sandford Craze, NP   REFERRING PROVIDER: Sandford Craze, NP  REFERRING DIAG: H81.13 (ICD-10-CM) - Benign paroxysmal positional vertigo due to bilateral vestibular disorder   THERAPY DIAG:  BPPV (benign paroxysmal positional vertigo), left  Dizziness and giddiness  ONSET DATE: ED on 03/22/23  Rationale for Evaluation and Treatment: Rehabilitation  SUBJECTIVE:   SUBJECTIVE STATEMENT: Doing a little bit better, still having dizziness off and on.  Pt accompanied by: self and husband in waiting area.   PERTINENT HISTORY: history of chronic dizziness, worsened recently, with mildly positive Left Gilberto Better on 03/24/23, h/o migraines, anxiety and depression.   PAIN:  Are you having pain? No  PRECAUTIONS: None  WEIGHT BEARING RESTRICTIONS: No  FALLS: Has patient fallen in last 6 months? 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   OBJECTIVE:   DIAGNOSTIC FINDINGS: DG cervical spine 09/10/2021 IMPRESSION: Straightening of the cervical spine.  Otherwise negative  COGNITION: Overall cognitive status: Within functional limits for tasks assessed   SENSATION: Denies numbness or parasthesias  POSTURE:  rounded shoulders  Cervical ROM:  Active AROM (deg) eval  Flexion 65  Extension 60  Right lateral flexion 35  Left lateral flexion 35  Right rotation 80  Left rotation 70  (Blank rows = not tested)  STRENGTH: deferred today   GAIT: Gait pattern: WFL  PATIENT SURVEYS:  DHI 44%  VESTIBULAR ASSESSMENT:  GENERAL OBSERVATION: no  apparent distress.    SYMPTOM BEHAVIOR:  Subjective history: has had dizziness for years, even as a child, can't stay standing for a long time because she feels dizzy, would have to crouch down.  Has never participated in any activities or sports because of the dizziness.  Hobbies are sedentary - reading and video games, even avoids fast paced games due to dizziness.  Symptoms only relieved by lying down.  Has never discussed chronic dizziness symptoms with PCP or seen ENT.   Non-Vestibular symptoms: nausea/vomiting  Type of dizziness: Imbalance (Disequilibrium), Spinning/Vertigo, and like I'm on a boat  Frequency: daily for years  Duration: doesn't stop just varies in intensity  Aggravating factors: Induced by position change: sit to stand and quick head position changes  Relieving factors: lying supine when a child would squat down  Progression of symptoms: unchanged  OCULOMOTOR EXAM: 05/05/2023  Ocular Alignment: normal  Ocular ROM: No Limitations  Spontaneous Nystagmus: absent  Gaze-Induced Nystagmus: end range right beating with right gaze and end range left beating with left gaze   Smooth Pursuits: intact  Saccades: intact  Convergence/Divergence: 28 cm   VESTIBULAR - OCULAR REFLEX: 05/05/2023  Slow VOR: Normal  VOR Cancellation: Corrective Saccades  Head-Impulse Test: HIT Right: negative HIT Left:negative  Dynamic Visual Acuity: NT today     POSITIONAL TESTING: Right Dix-Hallpike: no nystagmus Left Dix-Hallpike: no nystagmus Right Roll Test: no nystagmus Left Roll Test: geotropic nystagmus and symptoms worse on left side  MOTION SENSITIVITY:  Motion Sensitivity Quotient Intensity: 0 = none, 1 = Lightheaded, 2 = Mild, 3 = Moderate, 4 = Severe, 5 = Vomiting  Intensity  1. Sitting to supine 0 7/16  2. Supine to L side 0 7/16  3. Supine to R side 0 7/16  4. Supine to sitting 0 7/16  5. L Hallpike-Dix 0  6. Up from L  0 7/16  7. R Hallpike-Dix 0  8. Up from R  0 7/16  9.  Sitting, head tipped to L knee   10. Head up from L knee   11. Sitting, head tipped to R knee   12. Head up from R knee   13. Sitting head turns x5 2 7/16  14.Sitting head nods x5 2; Tilting head up mod dizziness  15. In stance, 180 turn to L    16. In stance, 180 turn to R     ORTHOSTATICS:  sitting 110/78, HR 98; standing 110/70 HR 109, dizzy.  Standing taken after 1 minute due to immediate increase in symptoms of dizziness.    VESTIBULAR TREATMENT:                                                                                                   DATE:  05/05/2023 Neuromuscular Reeducation:  VOR and oculomotor testing VOR x 1 vertical and horizontal Positional testing  04/22/2023 Canalith Repositioning:  BBQ Roll Left: Number of Reps: 1  PATIENT EDUCATION: Education details: findings, POC, education on BBQ roll Person educated: Patient Education method: Explanation, Demonstration, and Handouts Education comprehension: verbalized understanding  HOME EXERCISE PROGRAM:  Access Code: [email or web address] URL: [email or web address] Date: 05/05/2023 Prepared by: Harrie Foreman  Exercises - Seated Gaze Stabilization with Head Rotation  - 1 x daily - 7 x weekly - 3 sets - 10 reps - Seated Gaze Stabilization with Head Nod  - 1 x daily - 7 x weekly - 3 sets - 10 reps [email or web address]  GOALS: Goals reviewed with patient? Yes  SHORT TERM GOALS: Target date: 05/13/2023   Patient will report compliance with initial HEP.  Baseline: Goal status: IN PROGRESS 05/05/23- given  2.  Patient will report resolution of dizziness rolling over in bed.  Baseline:  Goal status: IN PROGRESS 05/05/23- negative BPPV testing today  3.  Patient will complete further assessment of VOR.  Baseline:  Goal status: MET 05/05/23- completed   LONG TERM GOALS: Target date: 06/03/2023    Patient will be independent with progressed HEP to  improve outcomes and carryover.  Baseline:  Goal status: IN PROGRESS  2.  Patient will report 50% improvement in dizziness with head movements. Baseline:  Goal status: IN PROGRESS  3.  Patient will report 18 points improvement on DHI to demonstrate improved QOL. Baseline: 44% impairment Goal status: IN PROGRESS    ASSESSMENT:  CLINICAL IMPRESSION: Clinton E Braziel completed further VOR testing today, noted normal physiologic endrange nystagmus, but also corrective saccades with VOR testing.  She also reported dizziness with head turns, especially without gaze fixation, and her convergence was very abnormal at 28 cm (normal is about 9 cm), indicating possible oculomotor involvement with her dizziness.  We rechecked her canals, and she was negative in all positions, so BPPV has cleared.  Her PCP has called to try to set up testing for POTS.  Today gave her VOR x 1 to start desensitization to head movements.  Will follow-up in 1 week after she's had a chance to work on this and progress.  Kahlee E Eberle continues to demonstrate potential for improvement and would benefit from continued skilled therapy to address impairments.       OBJECTIVE IMPAIRMENTS: decreased activity tolerance, dizziness, and postural dysfunction.   ACTIVITY LIMITATIONS: bending, standing, and locomotion level  PARTICIPATION LIMITATIONS: cleaning, laundry, community activity, and occupation  PERSONAL FACTORS: Fitness, Past/current experiences, and 3+ comorbidities: history migraines, chronic vertigo, GAD, depression, essential tremor  are also affecting patient's functional outcome.   REHAB POTENTIAL: Good  CLINICAL DECISION MAKING: Evolving/moderate complexity  EVALUATION COMPLEXITY: Moderate   PLAN:  PT FREQUENCY: 1-2x/week  PT DURATION: 6 weeks  PLANNED INTERVENTIONS: Therapeutic exercises, Therapeutic activity, Neuromuscular re-education, Balance training, Gait training, Patient/Family education, Self Care,  Joint mobilization, Vestibular training, Canalith repositioning, Dry Needling, Electrical stimulation, Spinal mobilization, Cryotherapy, Moist heat, Taping, Traction, Ultrasound, Manual therapy, and Re-evaluation  PLAN FOR NEXT SESSION: check strength and balance, review and progress VOR and habituation exercises, recheck canals if needed.     Jena Gauss, PT 05/05/2023, 12:33 PM

## 2023-05-06 ENCOUNTER — Other Ambulatory Visit: Payer: Self-pay | Admitting: Adult Health

## 2023-05-06 ENCOUNTER — Other Ambulatory Visit (HOSPITAL_COMMUNITY): Payer: Self-pay

## 2023-05-06 DIAGNOSIS — F330 Major depressive disorder, recurrent, mild: Secondary | ICD-10-CM

## 2023-05-06 DIAGNOSIS — F411 Generalized anxiety disorder: Secondary | ICD-10-CM

## 2023-05-06 MED ORDER — BUPROPION HCL ER (XL) 150 MG PO TB24
150.0000 mg | ORAL_TABLET | Freq: Every day | ORAL | 1 refills | Status: DC
Start: 2023-05-06 — End: 2023-08-14
  Filled 2023-05-06: qty 90, 90d supply, fill #0
  Filled 2023-07-20: qty 90, 90d supply, fill #1

## 2023-05-07 ENCOUNTER — Other Ambulatory Visit (HOSPITAL_COMMUNITY): Payer: Self-pay

## 2023-05-07 ENCOUNTER — Other Ambulatory Visit: Payer: Self-pay

## 2023-05-12 ENCOUNTER — Ambulatory Visit (INDEPENDENT_AMBULATORY_CARE_PROVIDER_SITE_OTHER): Payer: 59 | Admitting: Adult Health

## 2023-05-12 ENCOUNTER — Ambulatory Visit: Payer: 59 | Admitting: Physical Therapy

## 2023-05-12 ENCOUNTER — Encounter: Payer: Self-pay | Admitting: Adult Health

## 2023-05-12 ENCOUNTER — Encounter: Payer: Self-pay | Admitting: Physical Therapy

## 2023-05-12 VITALS — BP 100/78 | HR 82 | Ht 64.0 in | Wt 170.8 lb

## 2023-05-12 DIAGNOSIS — R42 Dizziness and giddiness: Secondary | ICD-10-CM

## 2023-05-12 DIAGNOSIS — R0683 Snoring: Secondary | ICD-10-CM | POA: Diagnosis not present

## 2023-05-12 DIAGNOSIS — H8112 Benign paroxysmal vertigo, left ear: Secondary | ICD-10-CM | POA: Diagnosis not present

## 2023-05-12 DIAGNOSIS — H8113 Benign paroxysmal vertigo, bilateral: Secondary | ICD-10-CM | POA: Diagnosis not present

## 2023-05-12 NOTE — Assessment & Plan Note (Signed)
Snoring and daytime sleepiness.  Was suspicious for underlying sleep apnea however home sleep study did not show any significant sleep apnea.  If symptoms continue could consider an in lab study.  She is already on Provigil for daytime hypersomnolence.  Advised helpful hands for healthy sleep regimen.  Advised to sleep with her head of the bed at a 30 degree angle.  Avoid sleeping on her back.  Will refer to orthodontics for evaluation of oral appliance for snoring.  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  Continue with healthy sleep regimen.  Plan  Patient Instructions  Refer to Orthodontist for oral appliance for snoring.  Work on healthy sleep regimen  Healthy weight loss  Do not drive if sleepy  Avoid sleeping on your back , sleep at incline .  Follow up As needed .

## 2023-05-12 NOTE — Therapy (Signed)
OUTPATIENT PHYSICAL THERAPY VESTIBULAR TREATMENT     Patient Name: Julie Webb MRN: [id number] DOB:01/08/1991, 32 y.o., female Today's Date: 05/12/2023  END OF SESSION:  PT End of Session - 05/12/23 1319     Visit Number 3    Date for PT Re-Evaluation 06/03/23    Authorization Type [facility] [employer]    PT Start Time 1319    PT Stop Time 1404    PT Time Calculation (min) 45 min    Activity Tolerance Patient tolerated treatment well    Behavior During Therapy [facility] for tasks assessed/performed             Past Medical History:  Diagnosis Date   Anxiety    Depression    Family history of adverse reaction to anesthesia    mother- N/V    Glomerulonephritis, poststreptococcal    age 45   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   Migraines    Vaginal Pap smear, abnormal    Past Surgical History:  Procedure Laterality Date   CHOLECYSTECTOMY Right 09/19/2020   Patient Active Problem List   Diagnosis Date Noted   Snoring 05/12/2023   Benign paroxysmal positional vertigo due to bilateral vestibular disorder 03/27/2023   Abdominal pain 03/27/2023   Constipation 03/27/2023   Daytime somnolence 09/02/2022   Essential tremor 01/17/2022   Chronic migraine w/o aura w/o status migrainosus, not intractable 01/17/2022   Benign essential tremor 11/19/2021   Neck pain 09/13/2021   Nevus 08/09/2021   Preventative health care 07/23/2021   Chronic cough 07/23/2021   Nonallopathic lesion of cervical region 12/08/2019   Nonallopathic lesion of thoracic region 12/08/2019   Encounter for planned induction of labor 11/23/2019   Nonallopathic lesion of rib cage 10/08/2019   Rib pain on right side 10/07/2019   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   Panic disorder 11/01/2018   Generalized anxiety disorder 11/01/2018   Chronic migraine 12/25/2017   Suicidal behavior with attempted self-injury (HCC)    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   Tremor 10/05/2015   Loss of  weight 07/06/2015   Social anxiety disorder 05/30/2015   H/O acute post-streptococcal glomerulonephritis 05/30/2015   Contraceptive management 05/30/2015   Migraine 05/30/2015    PCP: Sandford Craze, NP   REFERRING PROVIDER: Sandford Craze, NP  REFERRING DIAG: H81.13 (ICD-10-CM) - Benign paroxysmal positional vertigo due to bilateral vestibular disorder   THERAPY DIAG:  BPPV (benign paroxysmal positional vertigo), left  Dizziness and giddiness  ONSET DATE: ED on 03/22/23  Rationale for Evaluation and Treatment: Rehabilitation  SUBJECTIVE:   SUBJECTIVE STATEMENT: Been doing ok, just a little shook up, almost in a car accident on the way here today.  Has been doing the eye exercises, hasn't seen any difference.  Husband has looked up POTS symptoms and describe her quite well.  Pt accompanied by: self and husband in waiting area.   PERTINENT HISTORY: history of chronic dizziness, worsened recently, with mildly positive Left Gilberto Better on 03/24/23, h/o migraines, anxiety and depression.   PAIN:  Are you having pain? No  PRECAUTIONS: None  WEIGHT BEARING RESTRICTIONS: No  FALLS: Has patient fallen in last 6 months? 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   OBJECTIVE:   DIAGNOSTIC FINDINGS: DG cervical spine 09/10/2021 IMPRESSION: Straightening of  the cervical spine.  Otherwise negative  COGNITION: Overall cognitive status: Within functional limits for tasks assessed   SENSATION: Denies numbness or parasthesias  POSTURE:  rounded shoulders  Cervical ROM:    Active AROM (deg) eval  Flexion 65  Extension 60  Right lateral flexion 35  Left lateral flexion 35  Right rotation 80  Left rotation 70   (Blank rows = not tested)  STRENGTH: 5/5 bil LE & UE strength.    GAIT: Gait pattern: WFL  PATIENT SURVEYS:  DHI 44%  VESTIBULAR ASSESSMENT:  GENERAL OBSERVATION: no apparent distress.    SYMPTOM BEHAVIOR:  Subjective history: has had dizziness for years, even as a child, can't stay standing for a long time because she feels dizzy, would have to crouch down.  Has never participated in any activities or sports because of the dizziness.  Hobbies are sedentary - reading and video games, even avoids fast paced games due to dizziness.  Symptoms only relieved by lying down.  Has never discussed chronic dizziness symptoms with PCP or seen ENT.   Non-Vestibular symptoms: nausea/vomiting  Type of dizziness: Imbalance (Disequilibrium), Spinning/Vertigo, and like I'm on a boat  Frequency: daily for years  Duration: doesn't stop just varies in intensity  Aggravating factors: Induced by position change: sit to stand and quick head position changes  Relieving factors: lying supine when a child would squat down  Progression of symptoms: unchanged  OCULOMOTOR EXAM: 05/05/2023  Ocular Alignment: normal  Ocular ROM: No Limitations  Spontaneous Nystagmus: absent  Gaze-Induced Nystagmus: end range right beating with right gaze and end range left beating with left gaze   Smooth Pursuits: intact  Saccades: intact  Convergence/Divergence: 28 cm   VESTIBULAR - OCULAR REFLEX: 05/05/2023  Slow VOR: Normal  VOR Cancellation: Corrective Saccades  Head-Impulse Test: HIT Right: negative HIT Left:negative  Dynamic Visual Acuity: NT today     POSITIONAL TESTING: Right Dix-Hallpike: no nystagmus Left Dix-Hallpike: no nystagmus Right Roll Test: no nystagmus Left Roll Test: geotropic nystagmus and symptoms worse on left side  MOTION SENSITIVITY:  Motion Sensitivity Quotient Intensity: 0 = none, 1 = Lightheaded, 2 = Mild, 3 = Moderate, 4 = Severe, 5 = Vomiting  Intensity  1. Sitting to supine 0 7/16   2. Supine to L side 0 7/16  3. Supine to R side 0 7/16  4. Supine to sitting 0 7/16  5. L Hallpike-Dix 0  6. Up from L  0 7/16  7. R Hallpike-Dix 0  8. Up from R  0 7/16  9. Sitting, head tipped to L knee   10. Head up from L knee   11. Sitting, head tipped to R knee   12. Head up from R knee   13. Sitting head turns x5 2 7/16  14.Sitting head nods x5 2; Tilting head up mod dizziness  15. In stance, 180 turn to L    16. In stance, 180 turn to R     ORTHOSTATICS:  sitting 110/78, HR 98; standing 110/70 HR 109, dizzy.  Standing taken after 1 minute due to immediate increase in symptoms of dizziness.    VESTIBULAR TREATMENT:  DATE:   05/12/2023 Therapeutic Activity:  continued assessing balance, activity tolerance. MMT mCTSIB - 30 sec conditions 1-4 but nauseous afterwards Tandem balance x 30 sec each side but had to be stabilized initially  SLS x 10 sec bil challenging Recheck of all canals HEP update  05/05/2023 Neuromuscular Reeducation:  VOR and oculomotor testing VOR x 1 vertical and horizontal Positional testing  04/22/2023 Canalith Repositioning:  BBQ Roll Left: Number of Reps: 1  PATIENT EDUCATION: Education details: HEP update Person educated: Patient Education method: Explanation, Demonstration, and Handouts Education comprehension: verbalized understanding  HOME EXERCISE PROGRAM: Access Code: [email or web address] URL: [email or web address] Date: 05/12/2023 Prepared by: Harrie Foreman  Exercises - Seated Gaze Stabilization with Head Rotation  - 1 x daily - 7 x weekly - 3 sets - 10 reps - Seated Gaze Stabilization with Head Nod  - 1 x daily - 7 x weekly - 3 sets - 10 reps - Tandem Stance  - 1 x daily - 7 x weekly - 1 sets - 2 reps - 30 sec hold - Single Leg Stance  - 1 x daily - 7 x weekly - 1 sets - 2 reps - 30 sec hold - Seated Horizontal Smooth Pursuit   - 3 x daily - 7 x weekly - 3 sets - 10 reps  [email or web address]  GOALS: Goals reviewed with patient? Yes  SHORT TERM GOALS: Target date: 05/13/2023   Patient will report compliance with initial HEP.  Baseline: Goal status: MET 05/05/23- given 05/12/23- met  2.  Patient will report resolution of dizziness rolling over in bed.  Baseline:  Goal status: MET 05/05/23- negative BPPV testing today 05/12/23- met  3.  Patient will complete further assessment of VOR.  Baseline:  Goal status: MET 05/05/23- completed   LONG TERM GOALS: Target date: 06/03/2023    Patient will be independent with progressed HEP to improve outcomes and carryover.  Baseline:  Goal status: IN PROGRESS  2.  Patient will report 50% improvement in dizziness with head movements. Baseline:  Goal status: IN PROGRESS  3.  Patient will report 18 points improvement on DHI to demonstrate improved QOL. Baseline: 44% impairment Goal status: IN PROGRESS    ASSESSMENT:  CLINICAL IMPRESSION: Merita E Trafton reports good compliance with HEP and less dizziness with head movements, meeting STG #1.   She also is not having dizziness rolling over in bed, and all canals were again negative for BPPV, so has met STG #2.  She still has general dizziness, but has now been referred for testing to rule out/in POTS in late August, since even today standing fore more than 1 minute would make her feel nauseous.  Today completed further balance testing, she was challenged with tandem stance but able to maintain for 30 seconds once stabilized, but unable to maintain SLS for more than 10 seconds, which is well below normal for age and places her at higher fall risk.  She was able to maintain 30 sec on conditions 1-4 of mCTSIB demonstrating equal waiting of all balance systems including vestibular system.  However her balance challeges may be related to poor activity tolerance in general and lack  of opportunity to practice these skills.  Added larger amplitude movements for habituation today but still with gaze fixation and in seated position.  Also decreased visits to 1x/week.  Discussed that she might also benefit from referral to optometrist for prisms due to poor eye convergence.  Nereida E Kandler continues to demonstrate potential for improvement and would  benefit from continued skilled therapy to address impairments.       OBJECTIVE IMPAIRMENTS: decreased activity tolerance, dizziness, and postural dysfunction.   ACTIVITY LIMITATIONS: bending, standing, and locomotion level  PARTICIPATION LIMITATIONS: cleaning, laundry, community activity, and occupation  PERSONAL FACTORS: Fitness, Past/current experiences, and 3+ comorbidities: history migraines, chronic vertigo, GAD, depression, essential tremor  are also affecting patient's functional outcome.   REHAB POTENTIAL: Good  CLINICAL DECISION MAKING: Evolving/moderate complexity  EVALUATION COMPLEXITY: Moderate   PLAN:  PT FREQUENCY: 1-2x/week  PT DURATION: 6 weeks  PLANNED INTERVENTIONS: Therapeutic exercises, Therapeutic activity, Neuromuscular re-education, Balance training, Gait training, Patient/Family education, Self Care, Joint mobilization, Vestibular training, Canalith repositioning, Dry Needling, Electrical stimulation, Spinal mobilization, Cryotherapy, Moist heat, Taping, Traction, Ultrasound, Manual therapy, and Re-evaluation  PLAN FOR NEXT SESSION: progress habituation exercises as tolerated, add convergence exercises, FGA.    Jena Gauss, PT 05/12/2023, 3:13 PM

## 2023-05-12 NOTE — Progress Notes (Signed)
Reviewed and agree with assessment/plan.   Coralyn Helling, MD [facility] Pulmonary/Critical Care 05/12/2023, 1:32 PM Pager:  [phone]

## 2023-05-12 NOTE — Patient Instructions (Signed)
Refer to Orthodontist for oral appliance for snoring.  Work on healthy sleep regimen  Healthy weight loss  Do not drive if sleepy  Avoid sleeping on your back , sleep at incline .  Follow up As needed .

## 2023-05-12 NOTE — Progress Notes (Signed)
@Patient  ID: Julie Webb, female    DOB: Jan 12, 1991, 32 y.o.   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  TEST/EVENTS :   05/12/2023 Follow up : Snoring and Daytime sleepiness Patient returns for a follow-up visit.  She was seen earlier this year for a sleep consult.  She was having snoring and excessive daytime sleepiness.  She does work shift work.  She has been followed by psychiatry for anxiety and depression.  Also daytime hypersomnolence and has been on Provigil for the last couple years.  She was set up for a home sleep study that was done on January 04, 2023.  This showed no significant sleep apnea with AHI at 1.9/hour and SpO2 low at 98%.  Positive snoring throughout the sleep study.  We discussed her sleep study results.  She says since her last visit she does feel that she might be sleeping somewhat better but continues to have snoring.  We discussed a referral for oral appliance for ongoing snoring.  We also discussed helpful hints for healthy sleep regimen. Discussed positional sleep ,avoid sleeping supine . Sleep at incline with head of bed.      Allergies  Allergen Reactions   Propranolol     Dizziness   Phenergan [Promethazine Hcl] Other (See Comments)    Restlessness w/ IV Phenergen. Probable mild EPS. Tolerates if taken w/ Benadryl.     Immunization History  Administered Date(s) Administered   COVID-19, mRNA, vaccine(Comirnaty)12 years and older 09/02/2022   DTaP 11/09/1991, 05/09/1992, 08/10/1992, 06/25/1993, 03/23/1996   HIB (PRP-OMP) 11/09/1991, 05/09/1992, 08/10/1992, 06/25/1993   HPV Quadrivalent 10/22/2006, 12/25/2006, 05/13/2007   Hepatitis A 10/22/2006, 05/13/2007   Hepatitis B  03/11/1994, 03/23/1996, 07/21/1996   Hepb-cpg 10/29/2021   Influenza,inj,Quad PF,6+ Mos 07/06/2015, 07/13/2017, 08/14/2022, 08/15/2022   Influenza-Unspecified 07/16/2012, 07/13/2018, 07/13/2021   MMR 06/25/1993, 03/23/1996   Meningococcal Conjugate 10/22/2006   PFIZER(Purple Top)SARS-COV-2 Vaccination 01/20/2020, 02/10/2020, 10/31/2020   PPD Test 03/30/2012, 04/06/2012   Td 05/13/2007, 12/04/2017   Tdap 05/13/2007    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   Migraines    Vaginal Pap smear, abnormal     Tobacco History: Social History   Tobacco Use  Smoking Status Never  Smokeless Tobacco Never   Counseling given: Not Answered   Outpatient Medications Prior to Visit  Medication Sig Dispense Refill   alum & mag hydroxide-simeth (MAALOX MAX) 400-400-40 MG/5ML suspension Take 5 mLs by mouth every 6 (six) hours as needed for indigestion. 355 mL 0   Armodafinil 250 MG tablet Take 1 tablet (250 mg total) by mouth daily. 30 tablet 2   brexpiprazole (REXULTI) 1 MG TABS tablet Take 1 and 1/2 tablets (1.5 mg total) by mouth daily. 135 tablet 1   buPROPion (WELLBUTRIN XL) 150 MG 24 hr tablet Take 1 tablet (150 mg total) by mouth daily. 90 tablet 1   buPROPion (WELLBUTRIN XL) 300 MG 24 hr tablet Take 1 tablet (300 mg total) by mouth daily after breakfast 90 tablet  1   DULoxetine (CYMBALTA) 60 MG capsule Take 1 capsule (60 mg total) by mouth daily after breakfast. 90 capsule 1   hydrOXYzine (ATARAX) 25 MG tablet Take 1 tablet (25 mg total) by mouth daily. 30 tablet 5   meclizine (ANTIVERT) 25 MG tablet Take 1 tablet (25 mg total) by mouth 3 (three) times daily as needed for dizziness. 30 tablet 0   ondansetron (ZOFRAN-ODT) 4 MG disintegrating tablet Dissolve 1 tablet (4 mg total) in mouth every 8 (eight)  hours as needed for nausea or vomiting. 20 tablet 0   pantoprazole (PROTONIX) 40 MG tablet Take 1 tablet (40 mg total) by mouth daily. 30 tablet 5   primidone (MYSOLINE) 50 MG tablet Take 0.5 tablets (25 mg total) by mouth at bedtime. 30 tablet 5   promethazine (PHENERGAN) 25 MG tablet Take 1 tablet (25 mg total) by mouth every 8 (eight) hours as needed for nausea or vomiting. 20 tablet 0   scopolamine (TRANSDERM-SCOP) 1 MG/3DAYS Place 1 patch (1.5 mg total) onto the skin every 3 (three) days. 4 patch 0   sucralfate (CARAFATE) 1 g tablet Take 1 tablet (1 g total) by mouth 4 (four) times daily -  with meals and at bedtime. 120 tablet 5   SUMAtriptan (IMITREX) 50 MG tablet Take 1 tablet (50 mg total) by mouth at the onset of a migraine. Repeat in 2 hours. Max of 2 tablets in 24 hours. 12 tablet 6   No facility-administered medications prior to visit.     Review of Systems:   Constitutional:   No  weight loss, night sweats,  Fevers, chills, fatigue, or  lassitude.  HEENT:   No headaches,  Difficulty swallowing,  Tooth/dental problems, or  Sore throat,                No sneezing, itching, ear ache, nasal congestion, post nasal drip,   CV:  No chest pain,  Orthopnea, PND, swelling in lower extremities, anasarca, dizziness, palpitations, syncope.   GI  No heartburn, indigestion, abdominal pain, nausea, vomiting, diarrhea, change in bowel habits, loss of appetite, bloody stools.   Resp: No shortness of breath with exertion or at rest.  No excess mucus, no productive cough,  No non-productive cough,  No coughing up of blood.  No change in color of mucus.  No wheezing.  No chest wall deformity  Skin: no rash or lesions.  GU: no dysuria, change in color of urine, no urgency or frequency.  No flank pain, no hematuria   MS:  No joint pain or swelling.  No decreased range of motion.  No back pain.    Physical Exam  BP 100/78 (BP Location: Left Arm, Patient Position: Sitting, Cuff Size: Normal)    Pulse 82   Ht 5\' 4"  (1.626 m)   Wt 170 lb 12.8 oz (77.5 kg)   SpO2 98%   BMI 29.32 kg/m   GEN: A/Ox3; pleasant , NAD, well nourished    HEENT:  [place]/AT,   NOSE-clear, THROAT-clear, no lesions, no postnasal drip or exudate noted. Class 3 MP airway   NECK:  Supple w/ fair ROM; no JVD; normal carotid impulses w/o bruits; no thyromegaly or nodules palpated; no lymphadenopathy.    RESP  Clear  P & A; w/o, wheezes/ rales/ or rhonchi. no accessory muscle use, no dullness to percussion  CARD:  RRR, no m/r/g, no peripheral edema, pulses intact, no cyanosis or clubbing.  GI:   Soft & nt; nml bowel sounds;  no organomegaly or masses detected.   Musco: Warm bil, no deformities or joint swelling noted.   Neuro: alert, no focal deficits noted.    Skin: Warm, no lesions or rashes    Lab Results:  CBC    BNP No results found for: "BNP"  ProBNP No results found for: "PROBNP"  Imaging: No results found.  Administration History     None           No data to display          No results found for: "NITRICOXIDE"      Assessment & Plan:   Snoring Snoring and daytime sleepiness.  Was suspicious for underlying sleep apnea however home sleep study did not show any significant sleep apnea.  If symptoms continue could consider an in lab study.  She is already on Provigil for daytime hypersomnolence.  Advised helpful hands for healthy sleep regimen.  Advised to sleep with her head of the bed at a 30 degree angle.  Avoid sleeping on her back.  Will refer to orthodontics for evaluation of oral appliance for snoring.  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  Continue with healthy sleep regimen.  Plan  Patient Instructions  Refer to Orthodontist for oral appliance for snoring.  Work on healthy sleep regimen  Healthy weight loss  Do not drive if sleepy  Avoid sleeping on your back , sleep at incline .  Follow up As needed .       Rubye Oaks,  NP 05/12/2023

## 2023-05-14 ENCOUNTER — Encounter: Payer: 59 | Admitting: Physical Therapy

## 2023-05-15 ENCOUNTER — Ambulatory Visit (INDEPENDENT_AMBULATORY_CARE_PROVIDER_SITE_OTHER): Payer: 59 | Admitting: Adult Health

## 2023-05-15 ENCOUNTER — Other Ambulatory Visit (HOSPITAL_COMMUNITY): Payer: Self-pay

## 2023-05-15 ENCOUNTER — Encounter: Payer: Self-pay | Admitting: Adult Health

## 2023-05-15 DIAGNOSIS — F410 Panic disorder [episodic paroxysmal anxiety] without agoraphobia: Secondary | ICD-10-CM | POA: Diagnosis not present

## 2023-05-15 DIAGNOSIS — F4010 Social phobia, unspecified: Secondary | ICD-10-CM | POA: Diagnosis not present

## 2023-05-15 DIAGNOSIS — F330 Major depressive disorder, recurrent, mild: Secondary | ICD-10-CM | POA: Diagnosis not present

## 2023-05-15 DIAGNOSIS — F411 Generalized anxiety disorder: Secondary | ICD-10-CM

## 2023-05-15 DIAGNOSIS — G4726 Circadian rhythm sleep disorder, shift work type: Secondary | ICD-10-CM

## 2023-05-15 MED ORDER — ARMODAFINIL 250 MG PO TABS
250.0000 mg | ORAL_TABLET | Freq: Every day | ORAL | 2 refills | Status: DC
Start: 2023-05-15 — End: 2023-08-14
  Filled 2023-05-15: qty 30, 30d supply, fill #0
  Filled 2023-07-06: qty 30, 30d supply, fill #1
  Filled 2023-08-14: qty 30, 30d supply, fill #2

## 2023-05-15 MED ORDER — DULOXETINE HCL 60 MG PO CPEP
60.0000 mg | ORAL_CAPSULE | Freq: Every day | ORAL | 1 refills | Status: DC
Start: 2023-05-15 — End: 2023-12-15
  Filled 2023-05-15 – 2023-06-17 (×2): qty 90, 90d supply, fill #0
  Filled 2023-09-15: qty 90, 90d supply, fill #1

## 2023-05-15 MED ORDER — BREXPIPRAZOLE 1 MG PO TABS
1.5000 mg | ORAL_TABLET | Freq: Every day | ORAL | 1 refills | Status: DC
Start: 2023-05-15 — End: 2023-12-15
  Filled 2023-05-15 – 2023-06-17 (×2): qty 135, 90d supply, fill #0
  Filled 2023-09-15: qty 90, 60d supply, fill #1
  Filled 2023-09-15: qty 45, 30d supply, fill #1

## 2023-05-15 NOTE — Progress Notes (Signed)
Julie Webb [id number] Oct 20, 1991 32 y.o.  Subjective:   Patient ID:  Julie Webb is a 32 y.o. (DOB 27-Jul-1991) female.  Chief Complaint: No chief complaint on file.   HPI Julie Webb presents to the office today for follow-up of SAD, panic disorder, GAD, MDD.  Describes mood today as "ok". Pleasant. Tearful at times. Mood symptoms - reports depression - "off and on". Denies anxiety and irritability. Deies panic attacks. Reports worry, rumination, and over thinking. Mood is consistent. Stating "I'm feel like I'm managing ok". Family doing well. Stable interest and motivation. Taking medications as prescribed.  Energy levels lower. Active, does not have a regular exercise routine.  Enjoys some usual interests and activities. Married. Lives with husband and son (3). Spending time with family. Appetite adequate - GI issues. Weight gain - 160 pounds. Sleeps well most nights. Averages 8 to 9 hours. Napping during the day. Recent sleep study - awaiting results. Focus and concentration stable. Completing tasks. Managing aspects of household. 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  Denies AH or VH. Denies self harm. Denies substance use.   GAD-7    Flowsheet Row Office Visit from 09/02/2022 in [facility] at [facility] Office Visit from 07/23/2021 in [facility] Care at [facility]  Total GAD-7 Score 4 16      PHQ2-9    Flowsheet Row Office Visit from 09/02/2022 in [facility] at [facility] Office Visit from 07/23/2021 in [facility] Primary Care at [facility] Office Visit from 11/12/2017 in [facility] Primary Care at [facility] Total Score 4 4 0  PHQ-9 Total Score 15 16 --      Flowsheet Row ED from 03/22/2023 in [facility] Emergency Department at [facility] ED from 06/02/2021 in [facility]  Emergency Department at [facility]  C-SSRS RISK CATEGORY No Risk No Risk        Review of Systems:  Review of Systems  Musculoskeletal:  Negative for gait problem.  Neurological:  Negative for tremors.  Psychiatric/Behavioral:         Please refer to HPI    Medications: I have reviewed the patient's current medications.  Current Outpatient Medications  Medication Sig Dispense Refill   alum & mag hydroxide-simeth (MAALOX MAX) 400-400-40 MG/5ML suspension Take 5 mLs by mouth every 6 (six) hours as needed for indigestion. 355 mL 0   Armodafinil 250 MG tablet Take 1 tablet (250 mg total) by mouth daily. 30 tablet 2   brexpiprazole (REXULTI) 1 MG TABS tablet Take 1 and 1/2 tablets (1.5 mg total) by mouth daily. 135 tablet 1   buPROPion (WELLBUTRIN XL) 150 MG 24 hr tablet Take 1 tablet (150 mg total) by mouth daily. 90 tablet 1   buPROPion (WELLBUTRIN XL) 300 MG 24 hr tablet Take 1 tablet (300 mg total) by mouth daily after breakfast 90 tablet 1   DULoxetine (CYMBALTA) 60 MG capsule Take 1 capsule (60 mg total) by mouth daily after breakfast. 90 capsule 1   hydrOXYzine (ATARAX) 25 MG tablet Take 1 tablet (25 mg total) by mouth daily. 30 tablet 5   meclizine (ANTIVERT) 25 MG tablet Take 1 tablet (25 mg total) by mouth 3 (three) times daily as needed for dizziness. 30 tablet 0   ondansetron (ZOFRAN-ODT) 4 MG disintegrating tablet Dissolve 1  tablet (4 mg total) in mouth every 8 (eight) hours as needed for nausea or vomiting. 20 tablet 0   pantoprazole (PROTONIX) 40 MG tablet Take 1 tablet (40 mg total) by mouth daily. 30 tablet 5   primidone (MYSOLINE) 50 MG tablet Take 0.5 tablets (25 mg total) by mouth at bedtime. 30 tablet 5   promethazine (PHENERGAN) 25 MG tablet Take 1 tablet (25 mg total) by mouth every 8 (eight) hours as needed for nausea or vomiting. 20 tablet 0   scopolamine (TRANSDERM-SCOP) 1 MG/3DAYS Place 1 patch (1.5 mg total) onto the skin every 3 (three) days. 4 patch 0    sucralfate (CARAFATE) 1 g tablet Take 1 tablet (1 g total) by mouth 4 (four) times daily -  with meals and at bedtime. 120 tablet 5   SUMAtriptan (IMITREX) 50 MG tablet Take 1 tablet (50 mg total) by mouth at the onset of a migraine. Repeat in 2 hours. Max of 2 tablets in 24 hours. 12 tablet 6   No current facility-administered medications for this visit.    Medication Side Effects: None  Allergies:  Allergies  Allergen Reactions   Propranolol     Dizziness   Phenergan [Promethazine Hcl] Other (See Comments)    Restlessness w/ IV Phenergen. Probable mild EPS. Tolerates if taken w/ Benadryl.     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   Please see review of systems for further details on the patient's review from today.   Objective:   Physical Exam:  There were no vitals taken for this visit.  Physical Exam Constitutional:      General: She is not in acute distress. Musculoskeletal:        General: No deformity.  Neurological:     Mental Status: She is alert and oriented to person, place, and time.     Coordination: Coordination normal.  Psychiatric:        Attention and Perception: Attention and perception normal. She does not perceive auditory or visual hallucinations.        Mood and Affect: Affect is not labile, blunt, angry or inappropriate.        Speech: Speech normal.        Behavior: Behavior normal.        Thought Content: Thought content normal. Thought content is not paranoid or delusional. Thought content does not include homicidal or suicidal ideation. Thought content does not include homicidal or suicidal plan.         Cognition and Memory: Cognition and memory normal.        Judgment: Judgment normal.     Comments: Insight intact     Lab Review:     Component Value Date/Time   NA 137 03/22/2023 1825   K 3.5 03/22/2023 1825   CL 104 03/22/2023 1825   CO2 26 03/22/2023 1825   GLUCOSE 81 03/22/2023 1825   BUN 8 03/22/2023 1825   CREATININE 0.82 03/22/2023 1825   CALCIUM 9.1 03/22/2023 1825   PROT 7.5 03/22/2023 1825   ALBUMIN 4.0 03/22/2023 1825   AST 19 03/22/2023  1825   ALT 16 03/22/2023 1825   ALKPHOS 67 03/22/2023 1825   BILITOT 0.2 (L) 03/22/2023 1825   GFRNONAA >60 03/22/2023 1825   GFRAA >60 06/05/2019 1928       Component Value Date/Time   WBC 6.4 03/22/2023 1825   RBC 4.92 03/22/2023 1825   HGB 14.7 03/22/2023 1825   HCT 44.0 03/22/2023 1825   PLT 247 03/22/2023 1825   MCV 89.4 03/22/2023 1825   MCH 29.9 03/22/2023 1825   MCHC 33.4 03/22/2023 1825   RDW 12.4 03/22/2023 1825   LYMPHSABS 1.6 03/22/2023 1825   MONOABS 0.5 03/22/2023 1825   EOSABS 0.1 03/22/2023 1825   BASOSABS 0.1 03/22/2023 1825    No results found for: "POCLITH", "LITHIUM"   No results found for: "PHENYTOIN", "PHENOBARB", "VALPROATE", "CBMZ"   .res Assessment: Plan:    Plan:  PDMP reviewed  Hydroxyzine 25mg  daily as needed for annxiety Cymbalta 60mg  daily Wellbutrin XL 450mg  daily - denies seizure history - move to morning 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  Discussed potential metabolic side effects associated with atypical antipsychotics, as well as potential risk for movement side effects. Advised pt to contact office if movement side effects occur.   There are no diagnoses linked to this encounter.   Please see After Visit Summary for patient specific instructions.  Future Appointments  Date Time Provider Department  Center  05/15/2023 11:20 AM Dishawn Bhargava, Thereasa Solo, NP [facility] None  05/19/2023  2:00 PM Jena Gauss, [place] [facility]  05/26/2023 11:00 AM Jena Gauss, PT [facility]  06/16/2023  2:30 PM Mealor, Roberts Gaudy, MD [facility]    No orders of the defined types were placed in this encounter.   -------------------------------

## 2023-05-19 ENCOUNTER — Ambulatory Visit: Payer: 59 | Admitting: Physical Therapy

## 2023-05-19 ENCOUNTER — Encounter: Payer: Self-pay | Admitting: Physical Therapy

## 2023-05-19 DIAGNOSIS — H8113 Benign paroxysmal vertigo, bilateral: Secondary | ICD-10-CM | POA: Diagnosis not present

## 2023-05-19 DIAGNOSIS — R42 Dizziness and giddiness: Secondary | ICD-10-CM | POA: Diagnosis not present

## 2023-05-19 DIAGNOSIS — H8112 Benign paroxysmal vertigo, left ear: Secondary | ICD-10-CM

## 2023-05-19 NOTE — Therapy (Signed)
OUTPATIENT PHYSICAL THERAPY VESTIBULAR TREATMENT     Patient Name: Julie Webb MRN: [id number] DOB:01-15-1991, 32 y.o., female Today's Date: 05/19/2023  END OF SESSION:  PT End of Session - 05/19/23 1408     Visit Number 4    Date for PT Re-Evaluation 06/03/23    Authorization Type [facility] [employer]    PT Start Time 1406    PT Stop Time 1440    PT Time Calculation (min) 34 min    Activity Tolerance Patient tolerated treatment well    Behavior During Therapy [facility] for tasks assessed/performed             Past Medical History:  Diagnosis Date   Anxiety    Depression    Family history of adverse reaction to anesthesia    mother- N/V    Glomerulonephritis, poststreptococcal    age 89   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   Migraines    Vaginal Pap smear, abnormal    Past Surgical History:  Procedure Laterality Date   CHOLECYSTECTOMY Right 09/19/2020   Patient Active Problem List   Diagnosis Date Noted   Snoring 05/12/2023   Benign paroxysmal positional vertigo due to bilateral vestibular disorder 03/27/2023   Abdominal pain 03/27/2023   Constipation 03/27/2023   Daytime somnolence 09/02/2022   Essential tremor 01/17/2022   Chronic migraine w/o aura w/o status migrainosus, not intractable 01/17/2022   Benign essential tremor 11/19/2021   Neck pain 09/13/2021   Nevus 08/09/2021   Preventative health care 07/23/2021   Chronic cough 07/23/2021   Nonallopathic lesion of cervical region 12/08/2019   Nonallopathic lesion of thoracic region 12/08/2019   Encounter for planned induction of labor 11/23/2019   Nonallopathic lesion of rib cage 10/08/2019   Rib pain on right side 10/07/2019   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   Panic disorder 11/01/2018   Generalized anxiety disorder 11/01/2018   Chronic migraine 12/25/2017   Suicidal behavior with attempted self-injury (HCC)    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   Tremor 10/05/2015   Loss of  weight 07/06/2015   Social anxiety disorder 05/30/2015   H/O acute post-streptococcal glomerulonephritis 05/30/2015   Contraceptive management 05/30/2015   Migraine 05/30/2015    PCP: Sandford Craze, NP   REFERRING PROVIDER: Sandford Craze, NP  REFERRING DIAG: H81.13 (ICD-10-CM) - Benign paroxysmal positional vertigo due to bilateral vestibular disorder   THERAPY DIAG:  BPPV (benign paroxysmal positional vertigo), left  Dizziness and giddiness  ONSET DATE: ED on 03/22/23  Rationale for Evaluation and Treatment: Rehabilitation  SUBJECTIVE:   SUBJECTIVE STATEMENT: Still getting dizzy especially bending over, nauseous, had to take phenergan this morning, sees GI on the 6th, exercises are going well, but no real improvement or change in dizziness.  Pt accompanied by: self  PERTINENT HISTORY: history of chronic dizziness, worsened recently, with mildly positive Left Gilberto Better on 03/24/23, h/o migraines, anxiety and depression.   PAIN:  Are you having pain? No  PRECAUTIONS: None  WEIGHT BEARING RESTRICTIONS: No  FALLS: Has patient fallen in last 6 months? 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   OBJECTIVE:   DIAGNOSTIC FINDINGS: DG cervical spine 09/10/2021 IMPRESSION: Straightening of the cervical spine.  Otherwise negative  COGNITION: Overall cognitive status: Within functional limits for tasks assessed  SENSATION: Denies numbness or parasthesias  POSTURE:  rounded shoulders  Cervical ROM:    Active AROM (deg) eval  Flexion 65  Extension 60  Right lateral flexion 35  Left lateral flexion 35  Right rotation 80  Left rotation 70  (Blank rows = not tested)  STRENGTH: 5/5 bil LE & UE  strength.    GAIT: Gait pattern: WFL  PATIENT SURVEYS:  DHI 44%  VESTIBULAR ASSESSMENT:  GENERAL OBSERVATION: no apparent distress.    SYMPTOM BEHAVIOR:  Subjective history: has had dizziness for years, even as a child, can't stay standing for a long time because she feels dizzy, would have to crouch down.  Has never participated in any activities or sports because of the dizziness.  Hobbies are sedentary - reading and video games, even avoids fast paced games due to dizziness.  Symptoms only relieved by lying down.  Has never discussed chronic dizziness symptoms with PCP or seen ENT.   Non-Vestibular symptoms: nausea/vomiting  Type of dizziness: Imbalance (Disequilibrium), Spinning/Vertigo, and like I'm on a boat  Frequency: daily for years  Duration: doesn't stop just varies in intensity  Aggravating factors: Induced by position change: sit to stand and quick head position changes  Relieving factors: lying supine when a child would squat down  Progression of symptoms: unchanged  OCULOMOTOR EXAM: 05/05/2023  Ocular Alignment: normal  Ocular ROM: No Limitations  Spontaneous Nystagmus: absent  Gaze-Induced Nystagmus: end range right beating with right gaze and end range left beating with left gaze   Smooth Pursuits: intact  Saccades: intact  Convergence/Divergence: 28 cm   VESTIBULAR - OCULAR REFLEX: 05/05/2023  Slow VOR: Normal  VOR Cancellation: Corrective Saccades  Head-Impulse Test: HIT Right: negative HIT Left:negative  Dynamic Visual Acuity: NT today     POSITIONAL TESTING: Right Dix-Hallpike: no nystagmus Left Dix-Hallpike: no nystagmus Right Roll Test: no nystagmus Left Roll Test: geotropic nystagmus and symptoms worse on left side  MOTION SENSITIVITY:  Motion Sensitivity Quotient Intensity: 0 = none, 1 = Lightheaded, 2 = Mild, 3 = Moderate, 4 = Severe, 5 = Vomiting  Intensity  1. Sitting to supine 0 7/16  2. Supine to L side 0 7/16  3. Supine to R side 0 7/16   4. Supine to sitting 0 7/16  5. L Hallpike-Dix 0  6. Up from L  0 7/16  7. R Hallpike-Dix 0  8. Up from R  0 7/16  9. Sitting, head tipped to L knee   10. Head up from L knee   11. Sitting, head tipped to R knee   12. Head up from R knee   13. Sitting head turns x5 2 7/16  14.Sitting head nods x5 2; Tilting head up mod dizziness  15. In stance, 180 turn to L    16. In stance, 180 turn to R     ORTHOSTATICS:  sitting 110/78, HR 98; standing 110/70 HR 109, dizzy.  Standing taken after 1 minute due to immediate increase in symptoms of dizziness.    VESTIBULAR TREATMENT:  DATE:   05/19/23 Therapeutic Activity:   Review of HEP Given convergence - Brock String Exercises FGA 23/30 Retest all canals- negative.  Recommendations for therapy.   05/12/2023 Therapeutic Activity:  continued assessing balance, activity tolerance. MMT mCTSIB - 30 sec conditions 1-4 but nauseous afterwards Tandem balance x 30 sec each side but had to be stabilized initially  SLS x 10 sec bil challenging Recheck of all canals HEP update  05/05/2023 Neuromuscular Reeducation:  VOR and oculomotor testing VOR x 1 vertical and horizontal Positional testing  04/22/2023 Canalith Repositioning:  BBQ Roll Left: Number of Reps: 1  PATIENT EDUCATION: Education details: HEP update Person educated: Patient Education method: Explanation, Demonstration, and Handouts Education comprehension: verbalized understanding  HOME EXERCISE PROGRAM: [email or web address]  Access Code: [email or web address] URL: [email or web address] Date: 05/12/2023 Prepared by: Harrie Foreman  Exercises - Seated Gaze Stabilization with Head Rotation  - 1 x daily - 7 x weekly - 3 sets - 10 reps - Seated Gaze Stabilization with Head Nod  - 1 x daily - 7 x weekly - 3 sets - 10 reps - Tandem  Stance  - 1 x daily - 7 x weekly - 1 sets - 2 reps - 30 sec hold - Single Leg Stance  - 1 x daily - 7 x weekly - 1 sets - 2 reps - 30 sec hold - Seated Horizontal Smooth Pursuit  - 3 x daily - 7 x weekly - 3 sets - 10 reps  [email or web address]  GOALS: Goals reviewed with patient? Yes  SHORT TERM GOALS: Target date: 05/13/2023   Patient will report compliance with initial HEP.  Baseline: Goal status: MET 05/05/23- given 05/12/23- met  2.  Patient will report resolution of dizziness rolling over in bed.  Baseline:  Goal status: MET 05/05/23- negative BPPV testing today 05/12/23- met  3.  Patient will complete further assessment of VOR.  Baseline:  Goal status: MET 05/05/23- completed   LONG TERM GOALS: Target date: 06/03/2023    Patient will be independent with progressed HEP to improve outcomes and carryover.  Baseline:  Goal status: MET 05/19/23- met  2.  Patient will report 50% improvement in dizziness with head movements. Baseline:  Goal status: NOT MET 05/19/23 - no change  3.  Patient will report 18 points improvement on DHI to demonstrate improved QOL. Baseline: 44% impairment Goal status: NOT MET    ASSESSMENT:  CLINICAL IMPRESSION: Julie Webb reports no improvement or change in her dizziness, despite good compliance with HEP.  She does have follow-up with GI on Aug 6 about nausea, and then end of August to be tested for POTS.   Today assesses balance, she scored 23/30 on FGA, reported dizziness with head turns, challenged with tandem gait, and reports dizziness going down stairs.  Given Mal Amabile string exercises to work on for [employer] and again recommended further testing by optometrist.   Rechecked all canals which were still clear, so BPPV has resolved.  Discussed her poor activity tolerance in general and dizziness and nausea with activity that is not improving with PT or exercise.   At this time we will discharge  from PT and wait for further testing to rule out/in POTS.   Once testing has been completed and other issues are addressed, we can always revisit PT again in the future to work on activity tolerance.  Julie Webb is in agreement with this plan.      OBJECTIVE IMPAIRMENTS: decreased activity tolerance, dizziness, and postural dysfunction.   ACTIVITY LIMITATIONS:  bending, standing, and locomotion level  PARTICIPATION LIMITATIONS: cleaning, laundry, community activity, and occupation  PERSONAL FACTORS: Fitness, Past/current experiences, and 3+ comorbidities: history migraines, chronic vertigo, GAD, depression, essential tremor  are also affecting patient's functional outcome.   REHAB POTENTIAL: Good  CLINICAL DECISION MAKING: Evolving/moderate complexity  EVALUATION COMPLEXITY: Moderate   PLAN:  PT FREQUENCY: 1-2x/week  PT DURATION: 6 weeks  PLANNED INTERVENTIONS: Therapeutic exercises, Therapeutic activity, Neuromuscular re-education, Balance training, Gait training, Patient/Family education, Self Care, Joint mobilization, Vestibular training, Canalith repositioning, Dry Needling, Electrical stimulation, Spinal mobilization, Cryotherapy, Moist heat, Taping, Traction, Ultrasound, Manual therapy, and Re-evaluation  PLAN FOR NEXT SESSION: discharge to HEP  PHYSICAL THERAPY DISCHARGE SUMMARY  Visits from Start of Care: 4  Current functional level related to goals / functional outcomes: BPPV resolved   Remaining deficits: Continued dizziness, poor activity tolerance.    Education / Equipment: HEP  Plan: Patient agrees to discharge.  Patient goals were not met.     Jena Gauss, PT, DPT  05/19/2023, 4:02 PM

## 2023-05-20 ENCOUNTER — Other Ambulatory Visit: Payer: Self-pay

## 2023-05-21 ENCOUNTER — Encounter: Payer: 59 | Admitting: Physical Therapy

## 2023-05-26 ENCOUNTER — Encounter: Payer: 59 | Admitting: Physical Therapy

## 2023-05-26 ENCOUNTER — Other Ambulatory Visit (HOSPITAL_COMMUNITY): Payer: Self-pay

## 2023-05-26 DIAGNOSIS — K5900 Constipation, unspecified: Secondary | ICD-10-CM | POA: Diagnosis not present

## 2023-05-26 DIAGNOSIS — R109 Unspecified abdominal pain: Secondary | ICD-10-CM | POA: Diagnosis not present

## 2023-05-26 MED ORDER — PANTOPRAZOLE SODIUM 40 MG PO TBEC
40.0000 mg | DELAYED_RELEASE_TABLET | Freq: Two times a day (BID) | ORAL | 0 refills | Status: DC
  Filled 2023-05-26 – 2023-06-15 (×3): qty 60, 30d supply, fill #0

## 2023-05-28 ENCOUNTER — Encounter: Payer: 59 | Admitting: Physical Therapy

## 2023-06-09 DIAGNOSIS — R1013 Epigastric pain: Secondary | ICD-10-CM | POA: Diagnosis not present

## 2023-06-09 DIAGNOSIS — Z8719 Personal history of other diseases of the digestive system: Secondary | ICD-10-CM | POA: Diagnosis not present

## 2023-06-09 DIAGNOSIS — K2950 Unspecified chronic gastritis without bleeding: Secondary | ICD-10-CM | POA: Diagnosis not present

## 2023-06-09 DIAGNOSIS — K2930 Chronic superficial gastritis without bleeding: Secondary | ICD-10-CM | POA: Diagnosis not present

## 2023-06-09 DIAGNOSIS — R1010 Upper abdominal pain, unspecified: Secondary | ICD-10-CM | POA: Diagnosis not present

## 2023-06-09 DIAGNOSIS — K2289 Other specified disease of esophagus: Secondary | ICD-10-CM | POA: Diagnosis not present

## 2023-06-11 ENCOUNTER — Other Ambulatory Visit: Payer: Self-pay

## 2023-06-11 ENCOUNTER — Other Ambulatory Visit (HOSPITAL_COMMUNITY): Payer: Self-pay

## 2023-06-15 ENCOUNTER — Other Ambulatory Visit (HOSPITAL_COMMUNITY): Payer: Self-pay

## 2023-06-16 ENCOUNTER — Encounter: Payer: Self-pay | Admitting: Cardiovascular Disease

## 2023-06-16 ENCOUNTER — Ambulatory Visit: Payer: 59 | Attending: Cardiovascular Disease | Admitting: Cardiovascular Disease

## 2023-06-16 VITALS — BP 114/72 | HR 93 | Ht 64.0 in | Wt 171.8 lb

## 2023-06-16 DIAGNOSIS — H8113 Benign paroxysmal vertigo, bilateral: Secondary | ICD-10-CM

## 2023-06-16 DIAGNOSIS — G90A Postural orthostatic tachycardia syndrome (POTS): Secondary | ICD-10-CM

## 2023-06-16 NOTE — Patient Instructions (Signed)
Medication Instructions:  Your physician recommends that you continue on your current medications as directed. Please refer to the Current Medication list given to you today. *If you need a refill on your cardiac medications before your next appointment, please call your pharmacy*   Follow-Up: At [facility], you and your health needs are our priority.  As part of our continuing mission to provide you with exceptional heart care, we have created designated Provider Care Teams.  These Care Teams include your primary Cardiologist (physician) and Advanced Practice Providers (APPs -  Physician Assistants and Nurse Practitioners) who all work together to provide you with the care you need, when you need it.  We recommend signing up for the patient portal called "MyChart".  Sign up information is provided on this After Visit Summary.  MyChart is used to connect with patients for Virtual Visits (Telemedicine).  Patients are able to view lab/test results, encounter notes, upcoming appointments, etc.  Non-urgent messages can be sent to your provider as well.   To learn more about what you can do with MyChart, go to [email or web address].    Your next appointment:   3 month(s)  Provider:   York Pellant, MD   Other Instructions Dr Nelly Laurence recommends that you: Drink at least 3 liters of water per day Consume at least 8 grams of sodium (salt) per day Get at least 30-40 minutes of exercise 4-5 times per week with heart rate 130 or greater

## 2023-06-16 NOTE — Progress Notes (Signed)
Electrophysiology Office Note:    Date:  06/16/2023   ID:  AZKA WICKENHAUSER, DOB 12-Jun-1991, MRN [id number]  PCP:  Sandford Craze, NP   [facility] Providers Cardiologist:  None     Referring MD: Sandford Craze, NP   History of Present Illness:    Julie Webb is a 32 y.o. female with a medical history significant for BPPV, chronic abdominal pain, referred for evaluation of POTS.     She reports having postural dizziness for years.  She describes this as a presyncopal "about to faint" sensation rather than experiencing room spinning.  She has not had actual syncope in several years.  Presyncopal episodes occur to be most common when she is taking a hot shower.  Her dizziness is improved with lying down.  She has increased her fluid intake some recently.  She has not tried increasing salt intake or engaged in consistent exercise.      She has mild dizziness today present upon arrival and persisted through her orthostatic vital signs.  EKGs/Labs/Other Studies Reviewed Today:    Echocardiogram:    Monitors:   Stress testing:   Advanced imaging:   Cardiac catherization   EKG:   EKG Interpretation Date/Time:  Tuesday June 16 2023 14:49:38 EDT Ventricular Rate:  93 PR Interval:  138 QRS Duration:  82 QT Interval:  358 QTC Calculation: 445 R Axis:   81  Text Interpretation: Normal sinus rhythm Normal ECG When compared with ECG of 28-Nov-2021 11:48, No significant change was found Confirmed by York Pellant [phone]) on 06/16/2023 3:23:32 PM     Physical Exam:    VS:  BP 114/72 (BP Location: Left Arm, Patient Position: Sitting, Cuff Size: Large)   Pulse 93   Ht 5\' 4"  (1.626 m)   Wt 171 lb 12.8 oz (77.9 kg)   SpO2 98%   BMI 29.49 kg/m     Wt Readings from Last 3 Encounters:  06/16/23 171 lb 12.8 oz (77.9 kg)  05/12/23 170 lb 12.8 oz (77.5 kg)  03/24/23 168 lb (76.2 kg)     GEN:  Well nourished, well developed in no acute  distress CARDIAC: RRR, no murmurs, rubs, gallops RESPIRATORY:  Normal work of breathing MUSCULOSKELETAL: no edema  Orthostatic Vitals for the past 48 hrs (Last 6 readings):  Patient Position BP Pulse BP Location Cuff Size Patient Position (if appropriate) BP- Standing at 0 minutes Pulse- Standing at 0 minutes BP- Sitting Pulse- Sitting BP- Lying Pulse- Lying  06/16/23 1442 Sitting 114/72 93 Left Arm Large -- -- -- -- -- -- --  06/16/23 1457 -- -- -- -- -- Orthostatic Vitals 100/58 104 120/84 90 110/60 94    Pulse standing after 3 minutes 126 bpm, blood pressure 90/50   ASSESSMENT & PLAN:    POTS She did demonstrate an increase in heart rate of greater than 30 BPM after standing for 3 minutes. Today, she was consistently dizzy despite her posture I recommended the following: At least 8 g of salt intake per day, consuming 3 L or more of fluid a day.  Consistently exercise 30 to 40 minutes at a heart rate of 70% MPHR (~130 bpm) at least 5 times a week.  I advised her to use a recumbent machines such as a recumbent bicycle and work on lower body resistance training such as recumbent leg press. Wellbutrin can contribute to tachycardia, so she may discuss alternatives with her prescribing physician; however, because her current regimen has been working very  well for her she may want to try the above interventions first.  I spent greater than 45 minutes on this visit.  Signed, Maurice Small, MD  06/16/2023 3:26 PM    [facility]

## 2023-06-17 ENCOUNTER — Other Ambulatory Visit (HOSPITAL_COMMUNITY): Payer: Self-pay

## 2023-06-18 ENCOUNTER — Encounter: Payer: Self-pay | Admitting: Physical Therapy

## 2023-07-06 ENCOUNTER — Other Ambulatory Visit: Payer: Self-pay

## 2023-07-06 ENCOUNTER — Other Ambulatory Visit (HOSPITAL_COMMUNITY): Payer: Self-pay

## 2023-07-16 DIAGNOSIS — F332 Major depressive disorder, recurrent severe without psychotic features: Secondary | ICD-10-CM | POA: Diagnosis not present

## 2023-07-20 ENCOUNTER — Other Ambulatory Visit (HOSPITAL_COMMUNITY): Payer: Self-pay

## 2023-07-21 ENCOUNTER — Other Ambulatory Visit (HOSPITAL_COMMUNITY): Payer: Self-pay

## 2023-07-21 ENCOUNTER — Other Ambulatory Visit: Payer: Self-pay

## 2023-07-21 MED ORDER — INFLUENZA VIRUS VACC SPLIT PF (FLUZONE) 0.5 ML IM SUSY
0.5000 mL | PREFILLED_SYRINGE | Freq: Once | INTRAMUSCULAR | 0 refills | Status: AC
  Filled 2023-07-21: qty 0.5, 1d supply, fill #0

## 2023-07-21 MED ORDER — COVID-19 MRNA VAC-TRIS(PFIZER) 30 MCG/0.3ML IM SUSY
0.3000 mL | PREFILLED_SYRINGE | Freq: Once | INTRAMUSCULAR | 0 refills | Status: AC
  Filled 2023-07-21: qty 0.3, 1d supply, fill #0

## 2023-07-28 ENCOUNTER — Other Ambulatory Visit (HOSPITAL_COMMUNITY): Payer: Self-pay

## 2023-07-28 DIAGNOSIS — K5900 Constipation, unspecified: Secondary | ICD-10-CM | POA: Diagnosis not present

## 2023-07-28 DIAGNOSIS — K219 Gastro-esophageal reflux disease without esophagitis: Secondary | ICD-10-CM | POA: Diagnosis not present

## 2023-07-28 MED ORDER — PANTOPRAZOLE SODIUM 40 MG PO TBEC
40.0000 mg | DELAYED_RELEASE_TABLET | Freq: Two times a day (BID) | ORAL | 3 refills | Status: DC
  Filled 2023-07-28 – 2023-08-11 (×2): qty 180, 90d supply, fill #0
  Filled 2023-12-15: qty 180, 90d supply, fill #1

## 2023-08-05 ENCOUNTER — Other Ambulatory Visit (HOSPITAL_COMMUNITY): Payer: Self-pay

## 2023-08-07 ENCOUNTER — Other Ambulatory Visit (HOSPITAL_COMMUNITY): Payer: Self-pay

## 2023-08-11 ENCOUNTER — Other Ambulatory Visit (HOSPITAL_COMMUNITY): Payer: Self-pay

## 2023-08-14 ENCOUNTER — Encounter: Payer: Self-pay | Admitting: Adult Health

## 2023-08-14 ENCOUNTER — Ambulatory Visit (INDEPENDENT_AMBULATORY_CARE_PROVIDER_SITE_OTHER): Payer: 59 | Admitting: Adult Health

## 2023-08-14 ENCOUNTER — Other Ambulatory Visit (HOSPITAL_COMMUNITY): Payer: Self-pay

## 2023-08-14 ENCOUNTER — Other Ambulatory Visit: Payer: Self-pay

## 2023-08-14 DIAGNOSIS — G4726 Circadian rhythm sleep disorder, shift work type: Secondary | ICD-10-CM | POA: Diagnosis not present

## 2023-08-14 DIAGNOSIS — F330 Major depressive disorder, recurrent, mild: Secondary | ICD-10-CM | POA: Diagnosis not present

## 2023-08-14 DIAGNOSIS — F411 Generalized anxiety disorder: Secondary | ICD-10-CM | POA: Diagnosis not present

## 2023-08-14 DIAGNOSIS — F410 Panic disorder [episodic paroxysmal anxiety] without agoraphobia: Secondary | ICD-10-CM

## 2023-08-14 DIAGNOSIS — F4010 Social phobia, unspecified: Secondary | ICD-10-CM

## 2023-08-14 MED ORDER — BUPROPION HCL ER (XL) 150 MG PO TB24
150.0000 mg | ORAL_TABLET | Freq: Every day | ORAL | 1 refills | Status: DC
Start: 2023-08-14 — End: 2023-12-31
  Filled 2023-08-14 – 2023-10-19 (×2): qty 90, 90d supply, fill #0

## 2023-08-14 MED ORDER — BUPROPION HCL ER (XL) 300 MG PO TB24
300.0000 mg | ORAL_TABLET | Freq: Every day | ORAL | 1 refills | Status: DC
Start: 2023-08-14 — End: 2023-12-31
  Filled 2023-08-14 – 2023-10-19 (×2): qty 90, 90d supply, fill #0

## 2023-08-14 MED ORDER — ARMODAFINIL 250 MG PO TABS
250.0000 mg | ORAL_TABLET | Freq: Every day | ORAL | 2 refills | Status: DC
Start: 2023-08-14 — End: 2023-12-25
  Filled 2023-08-14: qty 30, 30d supply, fill #0
  Filled 2023-09-29: qty 30, 30d supply, fill #1
  Filled 2023-11-13: qty 30, 30d supply, fill #2

## 2023-08-14 NOTE — Progress Notes (Signed)
Julie Webb [id number] 1991/05/21 32 y.o.  Subjective:   Patient ID:  Julie Webb is a 32 y.o. (DOB August 09, 1991) female.  Chief Complaint: No chief complaint on file.   HPI Avary E Beining presents to the office today for follow-up of SAD, panic disorder, GAD, MDD.  Describes mood today as "ok". Pleasant. Tearful at times. Mood symptoms - reports depression - "some". Reports anxiety - more situational. Denies irritability. Reports a few panic attacks. Reports worry, rumination, and over thinking. Mood is consistent. Feels like medications are helpful. Stating "I'm doing very good". Recent diagnosis of POTS. Family doing well. Stable interest and motivation. Taking medications as prescribed.  Energy levels lower. Active, does not have a regular exercise routine.  Enjoys some usual interests and activities. Married. Lives with husband and son. Spending time with family. Appetite adequate - GI issues. Weight gain - 160 pounds. Sleeps well most nights. Averages 8 to 9 hours. Napping during the day.  Focus and concentration stable. Completing tasks. Managing aspects of household. 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  Denies AH or VH. Denies self harm. Denies substance use.    GAD-7    Flowsheet Row Office Visit from 09/02/2022 in [facility] at [facility] Office Visit from 07/23/2021 in [facility] at [facility]  Total GAD-7 Score 4 16      PHQ2-9    Flowsheet Row Office Visit from 09/02/2022 in [facility] at [facility] Office Visit from 07/23/2021 in [facility] at [facility] Office Visit from 11/12/2017 in [facility] Primary Care at [facility] Total Score 4 4 0  PHQ-9 Total Score 15 16 --      Flowsheet Row ED from 03/22/2023 in [facility] Emergency Department at [facility] ED from 06/02/2021 in [facility] Emergency Department at [facility]  C-SSRS RISK CATEGORY No Risk No Risk        Review of Systems:  Review of Systems  Musculoskeletal:  Negative for gait problem.  Neurological:  Negative for tremors.  Psychiatric/Behavioral:         Please refer to HPI    Medications: I have reviewed the patient's current medications.  Current Outpatient Medications  Medication Sig Dispense Refill   alum & mag hydroxide-simeth (MAALOX MAX) 400-400-40 MG/5ML suspension Take 5 mLs by mouth every 6 (six) hours as needed for indigestion. 355 mL 0   Armodafinil 250 MG tablet Take 1 tablet (250 mg total) by mouth daily. 30 tablet 2   brexpiprazole (REXULTI) 1 MG TABS tablet Take 1 and 1/2 tablets (1.5 mg total) by mouth daily. 135 tablet 1   buPROPion (WELLBUTRIN XL) 150 MG 24 hr tablet Take 1 tablet (150 mg total) by mouth daily. 90 tablet 1   buPROPion (WELLBUTRIN XL) 300 MG 24 hr tablet Take 1 tablet (300 mg total) by mouth daily after breakfast 90 tablet 1   DULoxetine (CYMBALTA) 60 MG capsule Take 1 capsule (60 mg total) by mouth daily after breakfast. 90 capsule 1   hydrOXYzine (ATARAX) 25 MG tablet Take 1 tablet (25 mg total) by mouth daily. 30 tablet 5   meclizine (ANTIVERT) 25 MG tablet Take 1 tablet (25 mg total) by mouth 3 (three) times daily as needed for dizziness. 30 tablet 0   ondansetron (ZOFRAN-ODT) 4  MG disintegrating tablet Dissolve 1 tablet (4 mg total) in mouth every 8 (eight) hours as needed for nausea or vomiting. 20 tablet 0   pantoprazole (PROTONIX) 40 MG tablet Take 1 tablet (40 mg total) by mouth daily. (Patient not taking: Reported on 06/16/2023) 30 tablet 5   pantoprazole (PROTONIX) 40 MG tablet Take 1 tablet (40 mg total) by mouth 2 (two) times daily. 60 tablet 0   pantoprazole (PROTONIX) 40 MG tablet Take 1 tablet (40 mg total) by mouth 2 (two) times daily. 180 tablet 3   primidone (MYSOLINE) 50 MG tablet Take 0.5 tablets (25 mg  total) by mouth at bedtime. 30 tablet 5   promethazine (PHENERGAN) 25 MG tablet Take 1 tablet (25 mg total) by mouth every 8 (eight) hours as needed for nausea or vomiting. 20 tablet 0   scopolamine (TRANSDERM-SCOP) 1 MG/3DAYS Place 1 patch (1.5 mg total) onto the skin every 3 (three) days. 4 patch 0   sucralfate (CARAFATE) 1 g tablet Take 1 tablet (1 g total) by mouth 4 (four) times daily -  with meals and at bedtime. 120 tablet 5   SUMAtriptan (IMITREX) 50 MG tablet Take 1 tablet (50 mg total) by mouth at the onset of a migraine. Repeat in 2 hours. Max of 2 tablets in 24 hours. 12 tablet 6   No current facility-administered medications for this visit.    Medication Side Effects: None  Allergies:  Allergies  Allergen Reactions   Propranolol     Dizziness   Phenergan [Promethazine Hcl] Other (See Comments)    Restlessness w/ IV Phenergen. Probable mild EPS. Tolerates if taken w/ Benadryl.     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   Please see review of systems for further details on the patient's review from today.   Objective:   Physical Exam:  There were no vitals taken for this visit.  Physical Exam Constitutional:      General: She is not in acute distress. Musculoskeletal:        General: No deformity.  Neurological:     Mental Status: She is alert and oriented to person, place, and time.     Coordination: Coordination normal.  Psychiatric:        Attention and Perception: Attention and perception normal. She does not perceive auditory or visual hallucinations.        Mood and Affect: Affect is not labile, blunt, angry  or inappropriate.        Speech: Speech normal.        Behavior: Behavior normal.        Thought Content: Thought content normal. Thought content is not paranoid or delusional. Thought content does not include homicidal or suicidal ideation. Thought content does not include homicidal or suicidal plan.        Cognition and Memory: Cognition and memory normal.        Judgment: Judgment normal.     Comments: Insight intact     Lab Review:     Component Value Date/Time   NA 137 03/22/2023 1825   K 3.5  03/22/2023 1825   CL 104 03/22/2023 1825   CO2 26 03/22/2023 1825   GLUCOSE 81 03/22/2023 1825   BUN 8 03/22/2023 1825   CREATININE 0.82 03/22/2023 1825   CALCIUM 9.1 03/22/2023 1825   PROT 7.5 03/22/2023 1825   ALBUMIN 4.0 03/22/2023 1825   AST 19 03/22/2023 1825   ALT 16 03/22/2023 1825   ALKPHOS 67 03/22/2023 1825   BILITOT 0.2 (L) 03/22/2023 1825   GFRNONAA >60 03/22/2023 1825   GFRAA >60 06/05/2019 1928       Component Value Date/Time   WBC 6.4 03/22/2023 1825   RBC 4.92 03/22/2023 1825   HGB 14.7 03/22/2023 1825   HCT 44.0 03/22/2023 1825   PLT 247 03/22/2023 1825   MCV 89.4 03/22/2023 1825   MCH 29.9 03/22/2023 1825   MCHC 33.4 03/22/2023 1825   RDW 12.4 03/22/2023 1825   LYMPHSABS 1.6 03/22/2023 1825   MONOABS 0.5 03/22/2023 1825   EOSABS 0.1 03/22/2023 1825   BASOSABS 0.1 03/22/2023 1825    No results found for: "POCLITH", "LITHIUM"   No results found for: "PHENYTOIN", "PHENOBARB", "VALPROATE", "CBMZ"   .res Assessment: Plan:    Plan:  PDMP reviewed  Hydroxyzine 25mg  daily as needed for annxiety Cymbalta 60mg  daily Wellbutrin XL 450mg  daily - denies seizure history - move to morning 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  Discussed potential metabolic side effects associated with atypical  antipsychotics, as well as potential risk for movement side effects. Advised pt to contact office if movement side effects occur.  There are no diagnoses linked to this encounter.   Please see After Visit Summary for patient specific instructions.  Future Appointments  Date Time Provider Department Center  08/14/2023 11:20 AM Michelina Mexicano, Thereasa Solo, NP [facility] None  10/01/2023  1:45 PM Mealor, Roberts Gaudy, MD [facility]    No orders of the defined types were placed in this encounter.   -------------------------------

## 2023-09-15 ENCOUNTER — Other Ambulatory Visit: Payer: Self-pay

## 2023-09-15 ENCOUNTER — Encounter: Payer: Self-pay | Admitting: Family

## 2023-09-15 ENCOUNTER — Other Ambulatory Visit (HOSPITAL_COMMUNITY): Payer: Self-pay

## 2023-09-15 MED ORDER — SUMATRIPTAN SUCCINATE 50 MG PO TABS
50.0000 mg | ORAL_TABLET | ORAL | 6 refills | Status: DC
  Filled 2023-09-15: qty 12, 30d supply, fill #0

## 2023-09-22 DIAGNOSIS — Z01419 Encounter for gynecological examination (general) (routine) without abnormal findings: Secondary | ICD-10-CM | POA: Diagnosis not present

## 2023-09-22 DIAGNOSIS — Z1331 Encounter for screening for depression: Secondary | ICD-10-CM | POA: Diagnosis not present

## 2023-09-22 DIAGNOSIS — Z124 Encounter for screening for malignant neoplasm of cervix: Secondary | ICD-10-CM | POA: Diagnosis not present

## 2023-09-22 DIAGNOSIS — Z113 Encounter for screening for infections with a predominantly sexual mode of transmission: Secondary | ICD-10-CM | POA: Diagnosis not present

## 2023-09-22 DIAGNOSIS — Z01411 Encounter for gynecological examination (general) (routine) with abnormal findings: Secondary | ICD-10-CM | POA: Diagnosis not present

## 2023-09-22 LAB — HM PAP SMEAR
HM Pap smear: NEGATIVE
HPV, high-risk: NEGATIVE

## 2023-09-29 ENCOUNTER — Other Ambulatory Visit (HOSPITAL_COMMUNITY): Payer: Self-pay

## 2023-10-01 ENCOUNTER — Ambulatory Visit: Payer: 59 | Admitting: Cardiovascular Disease

## 2023-10-14 NOTE — Progress Notes (Deleted)
  Electrophysiology Office Note:   Date:  10/14/2023  ID:  Julie Webb, DOB 02-12-1991, MRN [id number]  Primary Cardiologist: None Primary Heart Failure: None Electrophysiologist: None  {Click to update primary MD,subspecialty MD or APP then REFRESH:1}    History of Present Illness:   Julie Webb is a 32 y.o. female with h/o BPPV, chronic abdominal pain seen today for routine electrophysiology followup.   Since last being seen in our clinic the patient reports doing ***.  she denies chest pain, palpitations, dyspnea, PND, orthopnea, nausea, vomiting, dizziness, syncope, edema, weight gain, or early satiety.   Review of systems complete and found to be negative unless listed in HPI.   EP Information / Studies Reviewed:    {EKGtoday:28818}      ***  Risk Assessment/Calculations:     No BP recorded.  {Refresh Note OR Click here to enter BP  :1}***        Physical Exam:   VS:  There were no vitals taken for this visit.   Wt Readings from Last 3 Encounters:  06/16/23 171 lb 12.8 oz (77.9 kg)  05/12/23 170 lb 12.8 oz (77.5 kg)  03/24/23 168 lb (76.2 kg)     GEN: Well nourished, well developed in no acute distress NECK: No JVD; No carotid bruits CARDIAC: {EPRHYTHM:28826}, no murmurs, rubs, gallops RESPIRATORY:  Clear to auscultation without rales, wheezing or rhonchi  ABDOMEN: Soft, non-tender, non-distended EXTREMITIES:  No edema; No deformity   ASSESSMENT AND PLAN:    POTS  -Pt recommended to get at least 8 g of salt intake per day, consuming 3 L or more of fluid a day.  Consistently exercise 30 to 40 minutes at a heart rate of 70% MPHR at least 5 times a week > use a recumbent machines such as a recumbent bicycle and work on lower body resistance training such as recumbent leg press.  -stay upright as much as possible -shift positions, cross/uncross legs as "makeshift" pumping action  -ensure 8 hours sleep each night   Follow up with Dr. Nelly Laurence {EPFOLLOW  WG:[id number]}  Signed, Canary Brim, MSN, APRN, NP-C, AGACNP-BC [facility] - Electrophysiology  10/14/2023, 8:47 PM

## 2023-10-15 ENCOUNTER — Ambulatory Visit: Payer: 59 | Admitting: Pulmonary Disease

## 2023-10-15 DIAGNOSIS — G90A Postural orthostatic tachycardia syndrome (POTS): Secondary | ICD-10-CM

## 2023-10-19 ENCOUNTER — Other Ambulatory Visit (HOSPITAL_COMMUNITY): Payer: Self-pay

## 2023-10-28 ENCOUNTER — Telehealth: Payer: Self-pay

## 2023-10-28 NOTE — Telephone Encounter (Signed)
 Prior Authorization submitted for Rexulti 1mg  take 1.5 tablets daily, pending with [employer]

## 2023-11-02 ENCOUNTER — Ambulatory Visit: Payer: 59 | Admitting: Pulmonary Disease

## 2023-11-02 NOTE — Telephone Encounter (Signed)
 Prior Authorization for Rexulti  1 mg take 1.5 tablets daily is effective with [employer] from 11/08/2023 to 11/06/2024. The request was reviewed and approved by a licensed clinical pharmacist. This request is approved for a quantity of 1 and 1/2 tablets per day.

## 2023-11-04 NOTE — Progress Notes (Deleted)
  Electrophysiology Office Note:   Date:  11/04/2023  ID:  Julie Webb, DOB 1991/07/13, MRN [id number]  Primary Cardiologist: None Primary Heart Failure: None Electrophysiologist: None  {Click to update primary MD,subspecialty MD or APP then REFRESH:1}    History of Present Illness:   Julie Webb is a 33 y.o. female with h/o BPPV, chronic abdominal pain seen today for routine electrophysiology followup.   Since last being seen in our clinic the patient reports doing ***.  she denies chest pain, palpitations, dyspnea, PND, orthopnea, nausea, vomiting, dizziness, syncope, edema, weight gain, or early satiety.   Review of systems complete and found to be negative unless listed in HPI.   EP Information / Studies Reviewed:    {EKGtoday:28818}      Risk Assessment/Calculations:     No BP recorded.  {Refresh Note OR Click here to enter BP  :1}***        Physical Exam:   VS:  There were no vitals taken for this visit.   Wt Readings from Last 3 Encounters:  06/16/23 171 lb 12.8 oz (77.9 kg)  05/12/23 170 lb 12.8 oz (77.5 kg)  03/24/23 168 lb (76.2 kg)     GEN: Well nourished, well developed in no acute distress NECK: No JVD; No carotid bruits CARDIAC: {EPRHYTHM:28826}, no murmurs, rubs, gallops RESPIRATORY:  Clear to auscultation without rales, wheezing or rhonchi  ABDOMEN: Soft, non-tender, non-distended EXTREMITIES:  No edema; No deformity   ASSESSMENT AND PLAN:    POTS  -Pt recommended to get at least 8 g of salt intake per day, consuming 3 L or more of fluid a day.  Consistently exercise 30 to 40 minutes at a heart rate of 70% MPHR at least 5 times a week > use a recumbent machines such as a recumbent bicycle and work on lower body resistance training such as recumbent leg press.  -stay upright as much as possible -shift positions, cross/uncross legs as "makesh   Follow up with Dr. Nelly Laurence {EPFOLLOW WU:[id number]}  Signed, Canary Brim, MSN, APRN, NP-C, AGACNP-BC [facility] - Electrophysiology  11/04/2023, 7:40 AM

## 2023-11-05 ENCOUNTER — Ambulatory Visit: Payer: 59 | Admitting: Pulmonary Disease

## 2023-11-12 DIAGNOSIS — F332 Major depressive disorder, recurrent severe without psychotic features: Secondary | ICD-10-CM | POA: Diagnosis not present

## 2023-11-13 ENCOUNTER — Other Ambulatory Visit (HOSPITAL_COMMUNITY): Payer: Self-pay

## 2023-11-13 ENCOUNTER — Other Ambulatory Visit: Payer: Self-pay

## 2023-11-20 DIAGNOSIS — F332 Major depressive disorder, recurrent severe without psychotic features: Secondary | ICD-10-CM | POA: Diagnosis not present

## 2023-12-04 ENCOUNTER — Telehealth: Payer: 59 | Admitting: Physician Assistant

## 2023-12-04 DIAGNOSIS — G43809 Other migraine, not intractable, without status migrainosus: Secondary | ICD-10-CM | POA: Diagnosis not present

## 2023-12-04 MED ORDER — SUMATRIPTAN SUCCINATE 50 MG PO TABS: 50.0000 mg | ORAL_TABLET | ORAL | 6 refills | Status: DC

## 2023-12-04 NOTE — Progress Notes (Signed)
Virtual Visit Consent   LOWELL MCGURK, you are scheduled for a virtual visit with a [facility] provider today. Just as with appointments in the office, your consent must be obtained to participate. Your consent will be active for this visit and any virtual visit you may have with one of our providers in the next 365 days. If you have a MyChart account, a copy of this consent can be sent to you electronically.  As this is a virtual visit, video technology does not allow for your provider to perform a traditional examination. This may limit your provider's ability to fully assess your condition. If your provider identifies any concerns that need to be evaluated in person or the need to arrange testing (such as labs, EKG, etc.), we will make arrangements to do so. Although advances in technology are sophisticated, we cannot ensure that it will always work on either your end or our end. If the connection with a video visit is poor, the visit may have to be switched to a telephone visit. With either a video or telephone visit, we are not always able to ensure that we have a secure connection.  By engaging in this virtual visit, you consent to the provision of healthcare and authorize for your insurance to be billed (if applicable) for the services provided during this visit. Depending on your insurance coverage, you may receive a charge related to this service.  I need to obtain your verbal consent now. Are you willing to proceed with your visit today? Derrica E Spraggins has provided verbal consent on 12/04/2023 for a virtual visit (video or telephone). Laure Kidney, [place]  Date: 12/04/2023 9:43 PM   Virtual Visit via Video Note   I, Laure Kidney, connected with  TAISIA FANTINI  ([id number], November 03, 1990) on 12/04/23 at  9:30 PM EST by a video-enabled telemedicine application and verified that I am speaking with the correct person using two identifiers.  Location: Patient: Virtual Visit Location Patient:  Home Provider: Virtual Visit Location Provider: Home Office   I discussed the limitations of evaluation and management by telemedicine and the availability of in person appointments. The patient expressed understanding and agreed to proceed.    History of Present Illness: FATIM VANDERSCHAAF is a 33 y.o. who identifies as a female who was assigned female at birth, and is being seen today for migraine.  HPI: Migraine  This is a new problem. The current episode started today. The problem has been unchanged. The pain is located in the Bilateral region. The pain does not radiate. The pain quality is similar to prior headaches. The quality of the pain is described as band-like. The pain is mild. Pertinent negatives include no abdominal pain, abnormal behavior, anorexia, back pain, blurred vision, coughing, dizziness, facial sweating, insomnia, loss of balance, muscle aches, nausea, neck pain, numbness, phonophobia, photophobia, scalp tenderness, seizures, sinus pressure, sore throat, swollen glands, tingling, tinnitus, visual change, vomiting, weakness or weight loss. The symptoms are aggravated by bright light. She has tried nothing for the symptoms. Her past medical history is significant for migraine headaches.    Problems:  Patient Active Problem List   Diagnosis Date Noted   Snoring 05/12/2023   Benign paroxysmal positional vertigo due to bilateral vestibular disorder 03/27/2023   Abdominal pain 03/27/2023   Constipation 03/27/2023   Daytime somnolence 09/02/2022   Essential tremor 01/17/2022   Chronic migraine w/o aura w/o status migrainosus, not intractable 01/17/2022   Benign essential tremor 11/19/2021  Neck pain 09/13/2021   Nevus 08/09/2021   Preventative health care 07/23/2021   Chronic cough 07/23/2021   Nonallopathic lesion of cervical region 12/08/2019   Nonallopathic lesion of thoracic region 12/08/2019   Encounter for planned induction of labor 11/23/2019   Nonallopathic lesion of  rib cage 10/08/2019   Rib pain on right side 10/07/2019   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 Probable mild EPS. Tolerates if taken w/ Benadryl.    Medications:  Current Outpatient Medications:    alum & mag hydroxide-simeth (MAALOX MAX) 400-400-40 MG/5ML suspension, Take 5 mLs by mouth every 6 (six) hours as needed for indigestion., Disp: 355 mL, Rfl: 0   Armodafinil 250 MG tablet, Take 1 tablet (250 mg total) by mouth daily., Disp: 30 tablet, Rfl: 2   brexpiprazole (REXULTI) 1 MG TABS tablet, Take 1 and 1/2 tablets (1.5 mg total) by mouth daily., Disp: 135 tablet, Rfl: 1   buPROPion (WELLBUTRIN XL) 150 MG 24 hr tablet, Take 1 tablet (150 mg total) by mouth daily., Disp: 90 tablet, Rfl: 1   buPROPion (WELLBUTRIN XL) 300 MG 24 hr tablet, Take 1 tablet (300 mg total) by mouth daily after breakfast, Disp: 90 tablet, Rfl: 1   DULoxetine (CYMBALTA) 60 MG capsule, Take 1 capsule (60 mg total) by mouth daily after breakfast., Disp: 90 capsule, Rfl: 1   hydrOXYzine (ATARAX) 25 MG tablet, Take 1 tablet (25 mg total) by mouth daily., Disp: 30 tablet, Rfl: 5   meclizine (ANTIVERT) 25 MG tablet, Take 1 tablet (25 mg total) by mouth 3 (three) times daily as needed for dizziness., Disp: 30 tablet, Rfl: 0   ondansetron (ZOFRAN-ODT) 4 MG disintegrating tablet, Dissolve 1 tablet (4 mg total) in mouth every 8 (eight) hours as needed  for nausea or vomiting., Disp: 20 tablet, Rfl: 0   pantoprazole (PROTONIX) 40 MG tablet, Take 1 tablet (40 mg total) by mouth daily. (Patient not taking: Reported on 06/16/2023), Disp: 30 tablet, Rfl: 5   pantoprazole (PROTONIX) 40 MG tablet, Take 1 tablet (40 mg total) by mouth 2 (two) times daily., Disp: 60 tablet, Rfl: 0   pantoprazole (PROTONIX) 40 MG tablet, Take 1 tablet (40 mg total) by mouth 2 (two) times daily., Disp: 180 tablet, Rfl: 3   primidone (MYSOLINE) 50 MG tablet, Take 0.5 tablets (25 mg total) by mouth at bedtime., Disp: 30 tablet, Rfl: 5   promethazine (PHENERGAN) 25 MG tablet, Take 1 tablet (25 mg total) by mouth every 8 (eight) hours as needed for nausea or vomiting., Disp: 20 tablet, Rfl: 0   scopolamine (TRANSDERM-SCOP) 1 MG/3DAYS, Place 1 patch (1.5 mg total) onto the skin every 3 (three) days., Disp: 4 patch, Rfl: 0   sucralfate (CARAFATE) 1 g tablet, Take 1 tablet (1 g total) by mouth 4 (four) times daily -  with meals and at bedtime., Disp: 120 tablet, Rfl: 5   SUMAtriptan (IMITREX) 50 MG tablet, Take 1  tablet (50 mg total) by mouth at the onset of a migraine. Repeat in 2 hours. Max of 2 tablets in 24 hours., Disp: 12 tablet, Rfl: 6  Observations/Objective: Patient is well-developed, well-nourished in no acute distress.  Resting comfortably  at home.  Head is normocephalic, atraumatic.  No labored breathing.  Speech is clear and coherent with logical content.  Patient is alert and oriented at baseline.    Assessment and Plan: 1. Other migraine without status migrainosus, not intractable (Primary)  Patient presenting with headache.  Patient with a normal neurologic exam.  Patient has a history of migraine with no abnormal symptoms for a patient on the presentation today.No evidence of acute intracranial hemorrhage, venous sinus thrombosis, central nervous system infection, ocular disease to warrant imaging at this time. DDx also included sinus disease, intracranial  bleed, stroke, cluster headache, trigeminal neuralgia, tension headache, but there were felt to be less likely based on history and exam.  Presentation was most consistent with migraine headache.Recommend applying heat to affected area and massages. At this time is felt that the patient is stable for discharge.  In the meantime, supportive measures and return precautions were discussed at length. Indications for urgent reevaluation were discussed. Advised Pt to monitor for worrisome signs. Follow up with the primary provider.  Reprt to ER with the new or worsening symptom.  Follow Up Instructions: I discussed the assessment and treatment plan with the patient. The patient was provided an opportunity to ask questions and all were answered. The patient agreed with the plan and demonstrated an understanding of the instructions.  A copy of instructions were sent to the patient via MyChart unless otherwise noted below.     The patient was advised to call back or seek an in-person evaluation if the symptoms worsen or if the condition fails to improve as anticipated.    Laure Kidney, PA-C

## 2023-12-04 NOTE — Patient Instructions (Signed)
Julie Webb, thank you for joining Laure Kidney, PA-C for today's virtual visit.  While this provider is not your primary care provider (PCP), if your PCP is located in our provider database this encounter information will be shared with them immediately following your visit.   A [facility] MyChart account gives you access to today's visit and all your visits, tests, and labs performed at [facility] " click here if you don't have a [facility] MyChart account or go to [email or web address]  Consent: (Patient) Julie Webb provided verbal consent for this virtual visit at the beginning of the encounter.  Current Medications:  Current Outpatient Medications:    alum & mag hydroxide-simeth (MAALOX MAX) 400-400-40 MG/5ML suspension, Take 5 mLs by mouth every 6 (six) hours as needed for indigestion., Disp: 355 mL, Rfl: 0   Armodafinil 250 MG tablet, Take 1 tablet (250 mg total) by mouth daily., Disp: 30 tablet, Rfl: 2   brexpiprazole (REXULTI) 1 MG TABS tablet, Take 1 and 1/2 tablets (1.5 mg total) by mouth daily., Disp: 135 tablet, Rfl: 1   buPROPion (WELLBUTRIN XL) 150 MG 24 hr tablet, Take 1 tablet (150 mg total) by mouth daily., Disp: 90 tablet, Rfl: 1   buPROPion (WELLBUTRIN XL) 300 MG 24 hr tablet, Take 1 tablet (300 mg total) by mouth daily after breakfast, Disp: 90 tablet, Rfl: 1   DULoxetine (CYMBALTA) 60 MG capsule, Take 1 capsule (60 mg total) by mouth daily after breakfast., Disp: 90 capsule, Rfl: 1   hydrOXYzine (ATARAX) 25 MG tablet, Take 1 tablet (25 mg total) by mouth daily., Disp: 30 tablet, Rfl: 5   meclizine (ANTIVERT) 25 MG tablet, Take 1 tablet (25 mg total) by mouth 3 (three) times daily as needed for dizziness., Disp: 30 tablet, Rfl: 0   ondansetron (ZOFRAN-ODT) 4 MG disintegrating tablet, Dissolve 1 tablet (4 mg total) in mouth every 8 (eight) hours as needed for nausea or vomiting., Disp: 20 tablet, Rfl: 0   pantoprazole (PROTONIX) 40 MG tablet, Take 1  tablet (40 mg total) by mouth daily. (Patient not taking: Reported on 06/16/2023), Disp: 30 tablet, Rfl: 5   pantoprazole (PROTONIX) 40 MG tablet, Take 1 tablet (40 mg total) by mouth 2 (two) times daily., Disp: 60 tablet, Rfl: 0   pantoprazole (PROTONIX) 40 MG tablet, Take 1 tablet (40 mg total) by mouth 2 (two) times daily., Disp: 180 tablet, Rfl: 3   primidone (MYSOLINE) 50 MG tablet, Take 0.5 tablets (25 mg total) by mouth at bedtime., Disp: 30 tablet, Rfl: 5   promethazine (PHENERGAN) 25 MG tablet, Take 1 tablet (25 mg total) by mouth every 8 (eight) hours as needed for nausea or vomiting., Disp: 20 tablet, Rfl: 0   scopolamine (TRANSDERM-SCOP) 1 MG/3DAYS, Place 1 patch (1.5 mg total) onto the skin every 3 (three) days., Disp: 4 patch, Rfl: 0   sucralfate (CARAFATE) 1 g tablet, Take 1 tablet (1 g total) by mouth 4 (four) times daily -  with meals and at bedtime., Disp: 120 tablet, Rfl: 5   SUMAtriptan (IMITREX) 50 MG tablet, Take 1 tablet (50 mg total) by mouth at the onset of a migraine. Repeat in 2 hours. Max of 2 tablets in 24 hours., Disp: 12 tablet, Rfl: 6   Medications ordered in this encounter:  Meds ordered this encounter  Medications   SUMAtriptan (IMITREX) 50 MG tablet    Sig: Take 1 tablet (50 mg total) by mouth at the onset of a migraine. Repeat in  2 hours. Max of 2 tablets in 24 hours.    Dispense:  12 tablet    Refill:  6    Supervising Provider:   Merrilee Jansky [phone]     *If you need refills on other medications prior to your next appointment, please contact your pharmacy*  Follow-Up: Call back or seek an in-person evaluation if the symptoms worsen or if the condition fails to improve as anticipated.  [facility] [phone]  Other Instructions Report to ER with worsening symptoms.    If you have been instructed to have an in-person evaluation today at a local Urgent Care facility, please use the link below. It will take you to a list of all of  our available [facility], including address, phone number and hours of operation. Please do not delay care.  [facility]  If you or a family member do not have a primary care provider, use the link below to schedule a visit and establish care. When you choose a [facility] primary care physician or advanced practice provider, you gain a long-term partner in health. Find a Primary Care Provider  Learn more about [facility]'s in-office and virtual care options: [facility] - Get Care Now

## 2023-12-15 ENCOUNTER — Other Ambulatory Visit: Payer: Self-pay | Admitting: Adult Health

## 2023-12-15 ENCOUNTER — Other Ambulatory Visit: Payer: Self-pay

## 2023-12-15 ENCOUNTER — Other Ambulatory Visit (HOSPITAL_COMMUNITY): Payer: Self-pay

## 2023-12-15 DIAGNOSIS — F410 Panic disorder [episodic paroxysmal anxiety] without agoraphobia: Secondary | ICD-10-CM

## 2023-12-15 DIAGNOSIS — F330 Major depressive disorder, recurrent, mild: Secondary | ICD-10-CM

## 2023-12-15 DIAGNOSIS — F4010 Social phobia, unspecified: Secondary | ICD-10-CM

## 2023-12-15 DIAGNOSIS — F411 Generalized anxiety disorder: Secondary | ICD-10-CM

## 2023-12-15 MED ORDER — BREXPIPRAZOLE 1 MG PO TABS
1.5000 mg | ORAL_TABLET | Freq: Every day | ORAL | 0 refills | Status: DC
Start: 2023-12-15 — End: 2023-12-31
  Filled 2023-12-15: qty 45, 30d supply, fill #0

## 2023-12-15 MED ORDER — DULOXETINE HCL 60 MG PO CPEP
60.0000 mg | ORAL_CAPSULE | Freq: Every day | ORAL | 0 refills | Status: DC
Start: 2023-12-15 — End: 2023-12-31
  Filled 2023-12-15: qty 30, 30d supply, fill #0

## 2023-12-15 NOTE — Telephone Encounter (Signed)
 Please schedule pt an appt LV 10/25 due back in 3 months.

## 2023-12-15 NOTE — Telephone Encounter (Signed)
 Pt is scheduled 12/24/23

## 2023-12-22 DIAGNOSIS — F332 Major depressive disorder, recurrent severe without psychotic features: Secondary | ICD-10-CM | POA: Diagnosis not present

## 2023-12-24 ENCOUNTER — Telehealth: Payer: 59 | Admitting: Adult Health

## 2023-12-24 NOTE — Progress Notes (Deleted)
 Julie Webb [id number] 10-Jan-1991 33 y.o.  Virtual Visit via Video Note  I connected with pt @ on 12/24/23 at 11:30 AM EST by a video enabled telemedicine application and verified that I am speaking with the correct person using two identifiers.   I discussed the limitations of evaluation and management by telemedicine and the availability of in person appointments. The patient expressed understanding and agreed to proceed.  I discussed the assessment and treatment plan with the patient. The patient was provided an opportunity to ask questions and all were answered. The patient agreed with the plan and demonstrated an understanding of the instructions.   The patient was advised to call back or seek an in-person evaluation if the symptoms worsen or if the condition fails to improve as anticipated.  I provided 25 minutes of non-face-to-face time during this encounter.  The patient was located at home.  The provider was located at [facility].   Dorothyann Gibbs, NP   Subjective:   Patient ID:  Julie Webb is a 33 y.o. (DOB 14-May-1991) female.  Chief Complaint: No chief complaint on file.   HPI Laurali E Bergum presents for follow-up of SAD, panic disorder, GAD, MDD.  Describes mood today as "ok". Pleasant. Tearful at times. Mood symptoms - reports depression - "some". Reports anxiety - more situational. Denies irritability. Reports a few panic attacks. Reports worry, rumination, and over thinking. Mood is consistent. Feels like medications are helpful. Stating "I'm doing very good". Recent diagnosis of POTS. Family doing well. Stable interest and motivation. Taking medications as prescribed.  Energy levels lower. Active, does not have a regular exercise routine.  Enjoys some usual interests and activities. Married. Lives with husband and son. Spending time with family. Appetite adequate - GI issues. Weight gain - 160 pounds. Sleeps well most nights. Averages 8 to 9 hours.  Napping during the day.  Focus and concentration stable. Completing tasks. Managing aspects of household. 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  Denies AH or VH. Denies self harm. Denies substance use.   Review of Systems:  Review of Systems  Musculoskeletal:  Negative for gait problem.  Neurological:  Negative for tremors.  Psychiatric/Behavioral:         Please refer to HPI    Medications: I have reviewed the patient's current medications.  Current Outpatient Medications  Medication Sig Dispense Refill   alum & mag hydroxide-simeth (MAALOX MAX) 400-400-40 MG/5ML suspension Take 5 mLs by mouth every 6 (six) hours as needed for indigestion. 355 mL 0   Armodafinil 250 MG tablet Take 1 tablet (250 mg total) by mouth daily. 30 tablet 2   brexpiprazole (REXULTI) 1 MG TABS tablet Take 1 and 1/2 tablets (1.5 mg total) by mouth daily. 45 tablet 0   buPROPion (WELLBUTRIN XL) 150 MG 24 hr tablet Take 1 tablet (150 mg total) by mouth daily. 90 tablet 1   buPROPion (WELLBUTRIN XL) 300 MG 24 hr tablet Take 1 tablet (300 mg total) by mouth daily after breakfast 90 tablet 1   DULoxetine (CYMBALTA) 60 MG capsule Take 1 capsule (60 mg total) by mouth daily after breakfast. 30 capsule 0   hydrOXYzine (ATARAX) 25 MG tablet Take 1 tablet (25 mg total) by mouth daily. 30 tablet 5   meclizine (ANTIVERT) 25 MG tablet Take 1 tablet (25 mg total) by mouth 3 (three) times daily as needed for dizziness. 30 tablet 0  ondansetron (ZOFRAN-ODT) 4 MG disintegrating tablet Dissolve 1 tablet (4 mg total) in mouth every 8 (eight) hours as needed for nausea or vomiting. 20 tablet 0   pantoprazole (PROTONIX) 40 MG tablet Take 1 tablet (40 mg total) by mouth daily. (Patient not taking: Reported on 06/16/2023) 30 tablet 5   pantoprazole (PROTONIX) 40 MG tablet Take 1 tablet (40 mg total) by mouth 2 (two) times daily. 60 tablet 0   pantoprazole (PROTONIX) 40 MG tablet  Take 1 tablet (40 mg total) by mouth 2 (two) times daily. 180 tablet 3   primidone (MYSOLINE) 50 MG tablet Take 0.5 tablets (25 mg total) by mouth at bedtime. 30 tablet 5   promethazine (PHENERGAN) 25 MG tablet Take 1 tablet (25 mg total) by mouth every 8 (eight) hours as needed for nausea or vomiting. 20 tablet 0   scopolamine (TRANSDERM-SCOP) 1 MG/3DAYS Place 1 patch (1.5 mg total) onto the skin every 3 (three) days. 4 patch 0   sucralfate (CARAFATE) 1 g tablet Take 1 tablet (1 g total) by mouth 4 (four) times daily -  with meals and at bedtime. 120 tablet 5   SUMAtriptan (IMITREX) 50 MG tablet Take 1 tablet (50 mg total) by mouth at the onset of a migraine. Repeat in 2 hours. Max of 2 tablets in 24 hours. 12 tablet 6   No current facility-administered medications for this visit.    Medication Side Effects: None  Allergies:  Allergies  Allergen Reactions   Propranolol     Dizziness   Phenergan [Promethazine Hcl] Other (See Comments)    Restlessness w/ IV Phenergen. Probable mild EPS. Tolerates if taken w/ Benadryl.     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   Migraines    Vaginal Pap smear, abnormal     Family History  Problem Relation Age of Onset   Hyperlipidemia Mother    Mental illness Mother    Fibroids Mother        uterine   Hypertension Father    Heart attack Father    Heart disease Maternal Grandmother    Hyperlipidemia Maternal Grandmother    Hypertension Maternal Grandmother    Heart disease Maternal Grandfather    Hyperlipidemia Maternal Grandfather    Hypertension Maternal Grandfather    Pancreatic cancer Paternal Grandmother    Mesothelioma Paternal Grandfather     Social History   Socioeconomic History   Marital status: Married    Spouse name: Not on file   Number  of children: Not on file   Years of education: Not on file   Highest education level: Bachelor's degree (e.g., BA, AB, BS)  Occupational History   Not on file  Tobacco Use   Smoking status: Never   Smokeless tobacco: Never  Vaping Use   Vaping status: Never Used  Substance and Sexual Activity   Alcohol use: Yes    Comment: occasional   Drug use: No   Sexual activity: Yes    Birth control/protection: I.U.D.  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   Has a son born 11/23/2019   Grew up in [place]   Enjoys reading  Right handed   Has an [place] Bulldog   3 cups coffee per day, soda every other day      Social Drivers of Health   Financial Resource Strain: Patient Declined (03/24/2023)   Overall Financial Resource Strain (CARDIA)    Difficulty of Paying Living Expenses: Patient declined  Food Insecurity: Patient Declined (03/24/2023)   Hunger Vital Sign    Worried About Running Out of Food in the Last Year: Patient declined    Ran Out of Food in the Last Year: Patient declined  Transportation Needs: Patient Declined (03/24/2023)   PRAPARE - Administrator, Civil Service (Medical): Patient declined    Lack of Transportation (Non-Medical): Patient declined  Physical Activity: Insufficiently Active (03/24/2023)   Exercise Vital Sign    Days of Exercise per Week: 2 days    Minutes of Exercise per Session: 30 min  Stress: Stress Concern Present (03/24/2023)   [employer] of Occupational Health - Occupational Stress Questionnaire    Feeling of Stress : Rather much  Social Connections: Unknown (03/24/2023)   Social Connection and Isolation Panel [NHANES]    Frequency of Communication with Friends and Family: Patient declined    Frequency of Social Gatherings with Friends and Family: Patient declined    Attends Religious Services: Patient declined    Database administrator or Organizations:  Patient declined    Attends Engineer, structural: Not on file    Marital Status: Married  Catering manager Violence: Not on file    Past Medical History, Surgical history, Social history, and Family history were reviewed and updated as appropriate.   Please see review of systems for further details on the patient's review from today.   Objective:   Physical Exam:  There were no vitals taken for this visit.  Physical Exam Constitutional:      General: She is not in acute distress. Musculoskeletal:        General: No deformity.  Neurological:     Mental Status: She is alert and oriented to person, place, and time.     Coordination: Coordination normal.  Psychiatric:        Attention and Perception: Attention and perception normal. She does not perceive auditory or visual hallucinations.        Mood and Affect: Affect is not labile, blunt, angry or inappropriate.        Speech: Speech normal.        Behavior: Behavior normal.        Thought Content: Thought content normal. Thought content is not paranoid or delusional. Thought content does not include homicidal or suicidal ideation. Thought content does not include homicidal or suicidal plan.        Cognition and Memory: Cognition and memory normal.        Judgment: Judgment normal.     Comments: Insight intact     Lab Review:     Component Value Date/Time   NA 137 03/22/2023 1825   K 3.5 03/22/2023 1825   CL 104 03/22/2023 1825   CO2 26 03/22/2023 1825   GLUCOSE 81 03/22/2023 1825   BUN 8 03/22/2023 1825   CREATININE 0.82 03/22/2023 1825   CALCIUM 9.1 03/22/2023 1825   PROT 7.5 03/22/2023 1825   ALBUMIN 4.0 03/22/2023 1825   AST 19 03/22/2023 1825   ALT 16 03/22/2023 1825   ALKPHOS 67 03/22/2023 1825   BILITOT 0.2 (L) 03/22/2023 1825   GFRNONAA >60 03/22/2023 1825   GFRAA >  60 06/05/2019 1928       Component Value Date/Time   WBC 6.4 03/22/2023 1825   RBC 4.92 03/22/2023 1825   HGB 14.7 03/22/2023  1825   HCT 44.0 03/22/2023 1825   PLT 247 03/22/2023 1825   MCV 89.4 03/22/2023 1825   MCH 29.9 03/22/2023 1825   MCHC 33.4 03/22/2023 1825   RDW 12.4 03/22/2023 1825   LYMPHSABS 1.6 03/22/2023 1825   MONOABS 0.5 03/22/2023 1825   EOSABS 0.1 03/22/2023 1825   BASOSABS 0.1 03/22/2023 1825    No results found for: "POCLITH", "LITHIUM"   No results found for: "PHENYTOIN", "PHENOBARB", "VALPROATE", "CBMZ"   .res Assessment: Plan:   Plan:  PDMP reviewed  Hydroxyzine 25mg  daily as needed for anxiety Cymbalta 60mg  daily Wellbutrin XL 450mg  daily - denies seizure history - move to morning 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 Discussed continuing current medication regimen.  Patient advised to contact office with any questions, adverse effects, or acute worsening in signs and symptoms.  Discussed potential metabolic side effects associated with atypical antipsychotics, as well as potential risk for movement side effects. Advised pt to contact office if movement side effects occur.    There are no diagnoses linked to this encounter.   Please see After Visit Summary for patient specific instructions.  Future Appointments  Date Time Provider Department Center  12/24/2023 11:30 AM Khris Jansson, Thereasa Solo, NP [facility] None  01/26/2024 11:20 AM Jeanella Craze, NP [facility]    No orders of the defined types were placed in this encounter.     -------------------------------

## 2023-12-24 NOTE — Progress Notes (Signed)
 Patient unable to attend appt. - work conflict.

## 2023-12-25 ENCOUNTER — Other Ambulatory Visit (HOSPITAL_COMMUNITY): Payer: Self-pay

## 2023-12-25 ENCOUNTER — Ambulatory Visit: Admitting: Family Medicine

## 2023-12-25 ENCOUNTER — Other Ambulatory Visit: Payer: Self-pay | Admitting: Adult Health

## 2023-12-25 ENCOUNTER — Encounter: Payer: Self-pay | Admitting: Family Medicine

## 2023-12-25 VITALS — BP 122/76 | HR 88 | Temp 98.1°F | Ht 64.0 in | Wt 172.0 lb

## 2023-12-25 DIAGNOSIS — R051 Acute cough: Secondary | ICD-10-CM | POA: Diagnosis not present

## 2023-12-25 DIAGNOSIS — H6122 Impacted cerumen, left ear: Secondary | ICD-10-CM | POA: Diagnosis not present

## 2023-12-25 DIAGNOSIS — J0100 Acute maxillary sinusitis, unspecified: Secondary | ICD-10-CM | POA: Diagnosis not present

## 2023-12-25 DIAGNOSIS — G4726 Circadian rhythm sleep disorder, shift work type: Secondary | ICD-10-CM

## 2023-12-25 MED ORDER — BENZONATATE 200 MG PO CAPS
200.0000 mg | ORAL_CAPSULE | Freq: Two times a day (BID) | ORAL | 0 refills | Status: DC | PRN
Start: 2023-12-25 — End: 2024-01-26
  Filled 2023-12-25: qty 20, 10d supply, fill #0

## 2023-12-25 MED ORDER — AMOXICILLIN-POT CLAVULANATE 875-125 MG PO TABS
1.0000 | ORAL_TABLET | Freq: Two times a day (BID) | ORAL | 0 refills | Status: DC
Start: 2023-12-25 — End: 2024-01-26
  Filled 2023-12-25: qty 20, 10d supply, fill #0

## 2023-12-25 NOTE — Patient Instructions (Signed)
 Start Augmentin - take with food and water; consider adding a mid-day probiotic. Adding as needed Tessalon for cough.  Continue supportive measures including rest, hydration, humidifier use, steam showers, warm compresses to sinuses, warm liquids with lemon and honey, and over-the-counter cough, cold, and analgesics as needed.   Over the counter medications that may be helpful for symptoms:  Guaifenesin 1200 mg extended release tabs twice daily, with plenty of water For cough and congestion Brand name: Mucinex   Pseudoephedrine 30 mg, one or two tabs every 4 to 6 hours For sinus congestion Brand name: Sudafed You must get this from the pharmacy counter.  Oxymetazoline nasal spray each morning, one spray in each nostril, for NO MORE THAN 3 days  For nasal and sinus congestion Brand name: Afrin Saline nasal spray or Saline Nasal Irrigation (Netti Pot, etc) 3-5 times a day For nasal and sinus congestion Brand names: Ocean or AYR Fluticasone nasal spray OR Mometasone nasal spray OR Triamcinolone Acetonide nasal spray - follow directions on the packaging For nasal and sinus congestion Brand name: Flonase, Nasonex, Nasacort Warm salt water gargles  For sore throat Every few hours as needed Alternate ibuprofen 400-600 mg and acetaminophen 1000 mg every 6 hours For fever, body aches, headache Brand names: Motrin or Advil and Tylenol Dextromethorphan 12-hour cough version 30 mg every 12 hours  For cough Brand name: Delsym Stop all other cold medications for now (Nyquil, Dayquil, Tylenol Cold, Theraflu, etc) and other non-prescription cough/cold preparations. Many of these have the same ingredients listed above and could cause an overdose of medication.   Herbal treatments that have been shown to be helpful in some patients include: Vitamin C 1000 mg per day Zinc 100 mg per day Quercetin 25-500 mg twice a day Melatonin 5-10mg  at bedtime Honey Green Tea  General Instructions Allow  your body to rest Drink PLENTY of fluids Typically, we are the most contagious 1-2 days before symptoms start through the first 2-3 days of most severe symptoms. Per CDC guidelines, you can return to school/work when symptoms have started to improve and you have been fever-free for 24 hours. However, recommend you continue extra precautions for the following 5 days (frequent hand hygiene, masking, covering coughs/sneezes, minimize exposure to immunocompromised individuals, etc).  If you develop severe shortness of breath, uncontrolled fevers, coughing up blood, confusion, chest pain, or signs of dehydration (such as significantly decreased urine amounts or dizziness with standing) please go to the nearest ER.

## 2023-12-25 NOTE — Progress Notes (Signed)
 Acute Office Visit  Subjective:     Patient ID: Julie Webb, female    DOB: [birth year], 33 y.o.   MRN: [id number]  Chief Complaint  Patient presents with   Cough     Patient is in today for cough, sinus/ear pressure.   Discussed the use of AI scribe software for clinical note transcription with the patient, who gave verbal consent to proceed.  History of Present Illness The patient presents with persistent cough, sinus pressure, and fatigue for two weeks. Her son, Neita Goodnight, initially had a high fever of 104.78F, and her husband also experienced similar symptoms.  She has been experiencing a persistent cough and sinus pressure for the past two weeks. The cough is accompanied by occasional mild fevers and a sensation of chest congestion. She is able to blow her nose and expel mucus, although sometimes it feels stuck. No shortness of breath or wheezing is present.  Initially, she experienced severe body aches, fever, and significant fatigue, describing the body aches as 'wicked' and noting that the fatigue was so severe she was bedridden for most of the week. Despite this, she had to work on weekends. The headaches have progressed to migraines, and she reports sinus pain, particularly under the eyes.  Her ears feel clogged, with everything sounding muffled. Her ears were hurting a few days ago but have since improved, though she still feels pressure and popping.  She has been using over-the-counter medications such as Mucinex and Sudafed to manage her symptoms. She also reports a sore throat, though not severe. No recent antibiotic use or allergies to antibiotics.          All review of systems negative except what is listed in the HPI      Objective:    BP 122/76   Pulse 88   Temp 98.1 F (36.7 C) (Oral)   Ht 5\' 4"  (1.626 m)   Wt 172 lb (78 kg)   SpO2 98%   Breastfeeding No   BMI 29.52 kg/m    Physical Exam Vitals reviewed.  Constitutional:      Appearance:  Normal appearance.  HENT:     Head: Normocephalic and atraumatic.     Comments: Bilateral maxillary sinus tenderness to palpation     Ears:     Comments: Mild bilateral effusions; initially L ear impacted, but resolved with irrigation     Nose: Congestion and rhinorrhea present.     Mouth/Throat:     Mouth: Mucous membranes are moist.     Pharynx: Oropharynx is clear. No oropharyngeal exudate.  Cardiovascular:     Rate and Rhythm: Normal rate and regular rhythm.  Pulmonary:     Effort: Pulmonary effort is normal.     Breath sounds: Normal breath sounds. No wheezing, rhonchi or rales.  Skin:    General: Skin is warm and dry.  Neurological:     Mental Status: She is alert and oriented to person, place, and time.  Psychiatric:        Mood and Affect: Mood normal.        Behavior: Behavior normal.        Thought Content: Thought content normal.        Judgment: Judgment normal.     No results found for any visits on 12/25/23.      Assessment & Plan:   Problem List Items Addressed This Visit   None Visit Diagnoses       Acute non-recurrent maxillary sinusitis    -  Primary   Relevant Medications   amoxicillin-clavulanate (AUGMENTIN) 875-125 MG tablet   benzonatate (TESSALON) 200 MG capsule     Acute cough       Relevant Medications   benzonatate (TESSALON) 200 MG capsule     Start Augmentin - take with food and water; consider adding a mid-day probiotic. Adding as needed Tessalon for cough.  Continue supportive measures including rest, hydration, humidifier use, steam showers, warm compresses to sinuses, warm liquids with lemon and honey, and over-the-counter cough, cold, and analgesics as needed.   Indication: Cerumen impaction of the ear(s)  Medical necessity statement: On physical examination, cerumen impairs clinically significant portions of the external auditory canal, and tympanic membrane. Noted obstructive, copious cerumen that cannot be removed without  magnification and instrumentations requiring professional removal.   Consent: Discussed benefits and risks of procedure and verbal consent obtained  Procedure: Patient was prepped for the procedure. Otoscope utilized to assess and take note of the ear canal, the tympanic membrane, and the presence, amount, and placement of the cerumen.   Gentle irrigation with water at body temperature and soft plastic curette utilized to remove impacted cerumen.  Excess water drained by gravity and ear canal(s) dried with clean guaze.  Post procedure examination: Otoscopic examination reveals complete cerumen removal with no damage to the auditory canal, tympanic membrane, or surrounding tissue.  Patient tolerated procedure well.   Post procedure instructions: Patient made aware that they may experience temporary vertigo, temporary changes in hearing, and temporary discomfort. If these symptom last for more than 24 hours to call the clinic or proceed to the ED for further evaluation. Discussed avoiding placing objects into the ear canal for cleaning.    Meds ordered this encounter  Medications   amoxicillin-clavulanate (AUGMENTIN) 875-125 MG tablet    Sig: Take 1 tablet by mouth 2 (two) times daily.    Dispense:  20 tablet    Refill:  0    Supervising Provider:   Danise Edge A [[id number]]   benzonatate (TESSALON) 200 MG capsule    Sig: Take 1 capsule (200 mg total) by mouth 2 (two) times daily as needed for cough.    Dispense:  20 capsule    Refill:  0    Supervising Provider:   Danise Edge A [[id number]]    Return if symptoms worsen or fail to improve.  Clayborne Dana, NP

## 2023-12-28 ENCOUNTER — Other Ambulatory Visit (HOSPITAL_COMMUNITY): Payer: Self-pay

## 2023-12-28 MED ORDER — ARMODAFINIL 250 MG PO TABS
250.0000 mg | ORAL_TABLET | Freq: Every day | ORAL | 0 refills | Status: DC
Start: 2023-12-28 — End: 2023-12-31
  Filled 2023-12-28: qty 30, 30d supply, fill #0

## 2023-12-31 ENCOUNTER — Telehealth: Admitting: Adult Health

## 2023-12-31 ENCOUNTER — Encounter: Payer: Self-pay | Admitting: Adult Health

## 2023-12-31 ENCOUNTER — Other Ambulatory Visit (HOSPITAL_COMMUNITY): Payer: Self-pay

## 2023-12-31 DIAGNOSIS — G4701 Insomnia due to medical condition: Secondary | ICD-10-CM

## 2023-12-31 DIAGNOSIS — F4010 Social phobia, unspecified: Secondary | ICD-10-CM | POA: Diagnosis not present

## 2023-12-31 DIAGNOSIS — F410 Panic disorder [episodic paroxysmal anxiety] without agoraphobia: Secondary | ICD-10-CM

## 2023-12-31 DIAGNOSIS — F330 Major depressive disorder, recurrent, mild: Secondary | ICD-10-CM

## 2023-12-31 DIAGNOSIS — F411 Generalized anxiety disorder: Secondary | ICD-10-CM

## 2023-12-31 DIAGNOSIS — G4726 Circadian rhythm sleep disorder, shift work type: Secondary | ICD-10-CM

## 2023-12-31 MED ORDER — HYDROXYZINE HCL 25 MG PO TABS
25.0000 mg | ORAL_TABLET | Freq: Every day | ORAL | 2 refills | Status: AC
  Filled 2023-12-31 – 2024-05-16 (×5): qty 30, 30d supply, fill #0
  Filled 2024-06-10: qty 30, 30d supply, fill #1
  Filled 2024-10-27: qty 30, 30d supply, fill #2

## 2023-12-31 MED ORDER — BUPROPION HCL ER (XL) 300 MG PO TB24
300.0000 mg | ORAL_TABLET | Freq: Every day | ORAL | 1 refills | Status: DC
  Filled 2023-12-31 – 2024-01-18 (×3): qty 90, 90d supply, fill #0
  Filled 2024-04-15: qty 90, 90d supply, fill #1

## 2023-12-31 MED ORDER — ARMODAFINIL 250 MG PO TABS
250.0000 mg | ORAL_TABLET | Freq: Every day | ORAL | 2 refills | Status: DC
  Filled 2023-12-31 – 2024-02-11 (×2): qty 30, 30d supply, fill #0
  Filled 2024-04-08: qty 30, 30d supply, fill #1
  Filled 2024-06-10: qty 30, 30d supply, fill #2

## 2023-12-31 MED ORDER — BREXPIPRAZOLE 1 MG PO TABS
1.5000 mg | ORAL_TABLET | Freq: Every day | ORAL | 5 refills | Status: DC
Start: 2023-12-31 — End: 2024-07-11
  Filled 2023-12-31 – 2024-01-18 (×2): qty 45, 30d supply, fill #0
  Filled 2024-02-16: qty 45, 30d supply, fill #1
  Filled 2024-03-17: qty 45, 30d supply, fill #2
  Filled 2024-04-15: qty 45, 30d supply, fill #3
  Filled 2024-05-12 – 2024-05-16 (×4): qty 45, 30d supply, fill #4
  Filled 2024-06-10: qty 45, 30d supply, fill #5

## 2023-12-31 MED ORDER — BUPROPION HCL ER (XL) 150 MG PO TB24
150.0000 mg | ORAL_TABLET | Freq: Every day | ORAL | 1 refills | Status: DC
  Filled 2023-12-31 – 2024-01-18 (×3): qty 90, 90d supply, fill #0
  Filled 2024-04-15: qty 90, 90d supply, fill #1

## 2023-12-31 MED ORDER — DULOXETINE HCL 60 MG PO CPEP
60.0000 mg | ORAL_CAPSULE | Freq: Every day | ORAL | 1 refills | Status: DC
  Filled 2023-12-31 – 2024-01-18 (×2): qty 90, 90d supply, fill #0
  Filled 2024-04-15: qty 90, 90d supply, fill #1

## 2023-12-31 NOTE — Progress Notes (Signed)
 Julie Webb [id number] 1991/05/22 32 y.o.  Virtual Visit via Video Note  I connected with pt @ on 12/31/23 at 11:30 AM EDT by a video enabled telemedicine application and verified that I am speaking with the correct person using two identifiers.   I discussed the limitations of evaluation and management by telemedicine and the availability of in person appointments. The patient expressed understanding and agreed to proceed.  I discussed the assessment and treatment plan with the patient. The patient was provided an opportunity to ask questions and all were answered. The patient agreed with the plan and demonstrated an understanding of the instructions.   The patient was advised to call back or seek an in-person evaluation if the symptoms worsen or if the condition fails to improve as anticipated.  I provided 25 minutes of non-face-to-face time during this encounter.  The patient was located at home.  The provider was located at [facility].   Dorothyann Gibbs, NP   Subjective:   Patient ID:  Julie Webb is a 33 y.o. (DOB 16-Mar-1991) female.  Chief Complaint: No chief complaint on file.   HPI Filippa E Wyffels presents for follow-up of SAD, panic disorder, GAD, MDD.  Describes mood today as "ok". Pleasant. Tearful at times. Mood symptoms - reports depression - "some - mainly when sick - migraines". Reports stable interest and motivation. Reports anxiety - "not more than usual" - use vistaril occasionally. Denies irritability. Reports a few panic attacks. Reports worry, rumination, and over thinking. Mood is consistent. Stating "I feel like I'm doing ok". Feels like medications are helpful. Taking medications as prescribed.  Energy levels average. Active, does not have a regular exercise routine.  Enjoys some usual interests and activities. Married. Lives with husband and son. Spending time with family. Appetite adequate - GI issues. Weight - 172 pounds. Sleeps well most  nights. Averages 8 hours. Denies daytime napping.  Focus and concentration stable. Completing tasks. Managing aspects of household. 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 Denies SI or HI.  Denies AH or VH. Denies self harm. Denies substance use.  Review of Systems:  Review of Systems  Musculoskeletal:  Negative for gait problem.  Neurological:  Negative for tremors.  Psychiatric/Behavioral:         Please refer to HPI   Medications: I have reviewed the patient's current medications.  Current Outpatient Medications  Medication Sig Dispense Refill   alum & mag hydroxide-simeth (MAALOX MAX) 400-400-40 MG/5ML suspension Take 5 mLs by mouth every 6 (six) hours as needed for indigestion. 355 mL 0   amoxicillin-clavulanate (AUGMENTIN) 875-125 MG tablet Take 1 tablet by mouth 2 (two) times daily. 20 tablet 0   Armodafinil 250 MG tablet Take 1 tablet (250 mg total) by mouth daily. 30 tablet 0   benzonatate (TESSALON) 200 MG capsule Take 1 capsule (200 mg total) by mouth 2 (two) times daily as needed for cough. 20 capsule 0   brexpiprazole (REXULTI) 1 MG TABS tablet Take 1 and 1/2 tablets (1.5 mg total) by mouth daily. 45 tablet 0   buPROPion (WELLBUTRIN XL) 150 MG 24 hr tablet Take 1 tablet (150 mg total) by mouth daily. 90 tablet 1   buPROPion (WELLBUTRIN XL) 300 MG 24 hr tablet Take 1 tablet (300 mg total) by mouth daily after breakfast 90 tablet 1   DULoxetine (CYMBALTA) 60 MG capsule Take 1 capsule (60 mg total) by mouth daily after breakfast. 30 capsule 0  hydrOXYzine (ATARAX) 25 MG tablet Take 1 tablet (25 mg total) by mouth daily. 30 tablet 5   meclizine (ANTIVERT) 25 MG tablet Take 1 tablet (25 mg total) by mouth 3 (three) times daily as needed for dizziness. 30 tablet 0   pantoprazole (PROTONIX) 40 MG tablet Take 1 tablet (40 mg total) by mouth daily. 30 tablet 5   primidone (MYSOLINE) 50 MG tablet Take 0.5 tablets (25 mg total) by mouth at bedtime. 30 tablet 5    promethazine (PHENERGAN) 25 MG tablet Take 1 tablet (25 mg total) by mouth every 8 (eight) hours as needed for nausea or vomiting. 20 tablet 0   scopolamine (TRANSDERM-SCOP) 1 MG/3DAYS Place 1 patch (1.5 mg total) onto the skin every 3 (three) days. (Patient not taking: Reported on 12/25/2023) 4 patch 0   sucralfate (CARAFATE) 1 g tablet Take 1 tablet (1 g total) by mouth 4 (four) times daily -  with meals and at bedtime. 120 tablet 5   SUMAtriptan (IMITREX) 50 MG tablet Take 1 tablet (50 mg total) by mouth at the onset of a migraine. Repeat in 2 hours. Max of 2 tablets in 24 hours. 12 tablet 6   No current facility-administered medications for this visit.    Medication Side Effects: None  Allergies:  Allergies  Allergen Reactions   Propranolol     Dizziness   Phenergan [Promethazine Hcl] Other (See Comments)    Restlessness w/ IV Phenergen. Probable mild EPS. Tolerates if taken w/ Benadryl.     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   Migraines    Vaginal Pap smear, abnormal     Family History  Problem Relation Age of Onset   Hyperlipidemia Mother    Mental illness Mother    Fibroids Mother        uterine   Hypertension Father    Heart attack Father    Heart disease Maternal Grandmother    Hyperlipidemia Maternal Grandmother    Hypertension Maternal Grandmother    Heart disease Maternal Grandfather    Hyperlipidemia Maternal Grandfather    Hypertension Maternal Grandfather    Pancreatic cancer Paternal Grandmother    Mesothelioma Paternal Grandfather     Social History   Socioeconomic History   Marital status: Married    Spouse name: Not on file   Number of children: Not on file   Years of education: Not on file   Highest education level: Bachelor's degree (e.g., BA, AB, BS)   Occupational History   Not on file  Tobacco Use   Smoking status: Never   Smokeless tobacco: Never  Vaping Use   Vaping status: Never Used  Substance and Sexual Activity   Alcohol use: Yes    Comment: occasional   Drug use: No   Sexual activity: Yes    Birth control/protection: I.U.D.  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   Has a son born 11/23/2019   Grew up in [place]   Enjoys reading   Right handed   Has an English Bulldog   3 cups coffee per day, soda every other day      Social Drivers of  Health   Financial Resource Strain: Patient Declined (03/24/2023)   Overall Financial Resource Strain (CARDIA)    Difficulty of Paying Living Expenses: Patient declined  Food Insecurity: Patient Declined (03/24/2023)   Hunger Vital Sign    Worried About Running Out of Food in the Last Year: Patient declined    Ran Out of Food in the Last Year: Patient declined  Transportation Needs: Patient Declined (03/24/2023)   PRAPARE - Administrator, Civil Service (Medical): Patient declined    Lack of Transportation (Non-Medical): Patient declined  Physical Activity: Insufficiently Active (03/24/2023)   Exercise Vital Sign    Days of Exercise per Week: 2 days    Minutes of Exercise per Session: 30 min  Stress: Stress Concern Present (03/24/2023)   [employer] of Occupational Health - Occupational Stress Questionnaire    Feeling of Stress : Rather much  Social Connections: Unknown (03/24/2023)   Social Connection and Isolation Panel [NHANES]    Frequency of Communication with Friends and Family: Patient declined    Frequency of Social Gatherings with Friends and Family: Patient declined    Attends Religious Services: Patient declined    Database administrator or Organizations: Patient declined    Attends Engineer, structural: Not on file    Marital Status: Married  Catering manager  Violence: Not on file    Past Medical History, Surgical history, Social history, and Family history were reviewed and updated as appropriate.   Please see review of systems for further details on the patient's review from today.   Objective:   Physical Exam:  There were no vitals taken for this visit.  Physical Exam Constitutional:      General: She is not in acute distress. Musculoskeletal:        General: No deformity.  Neurological:     Mental Status: She is alert and oriented to person, place, and time.     Coordination: Coordination normal.  Psychiatric:        Attention and Perception: Attention and perception normal. She does not perceive auditory or visual hallucinations.        Mood and Affect: Mood normal. Mood is not anxious or depressed. Affect is not labile, blunt, angry or inappropriate.        Speech: Speech normal.        Behavior: Behavior normal.        Thought Content: Thought content normal. Thought content is not paranoid or delusional. Thought content does not include homicidal or suicidal ideation. Thought content does not include homicidal or suicidal plan.        Cognition and Memory: Cognition and memory normal.        Judgment: Judgment normal.     Comments: Insight intact     Lab Review:     Component Value Date/Time   NA 137 03/22/2023 1825   K 3.5 03/22/2023 1825   CL 104 03/22/2023 1825   CO2 26 03/22/2023 1825   GLUCOSE 81 03/22/2023 1825   BUN 8 03/22/2023 1825   CREATININE 0.82 03/22/2023 1825   CALCIUM 9.1 03/22/2023 1825   PROT 7.5 03/22/2023 1825   ALBUMIN 4.0 03/22/2023 1825   AST 19 03/22/2023 1825   ALT 16 03/22/2023 1825   ALKPHOS 67 03/22/2023 1825   BILITOT 0.2 (L) 03/22/2023 1825   GFRNONAA >60 03/22/2023 1825   GFRAA >60 06/05/2019 1928       Component Value Date/Time   WBC 6.4 03/22/2023 1825  RBC 4.92 03/22/2023 1825   HGB 14.7 03/22/2023 1825   HCT 44.0 03/22/2023 1825   PLT 247 03/22/2023 1825   MCV 89.4  03/22/2023 1825   MCH 29.9 03/22/2023 1825   MCHC 33.4 03/22/2023 1825   RDW 12.4 03/22/2023 1825   LYMPHSABS 1.6 03/22/2023 1825   MONOABS 0.5 03/22/2023 1825   EOSABS 0.1 03/22/2023 1825   BASOSABS 0.1 03/22/2023 1825    No results found for: "POCLITH", "LITHIUM"   No results found for: "PHENYTOIN", "PHENOBARB", "VALPROATE", "CBMZ"   .res Assessment: Plan:    Plan:  PDMP reviewed  Hydroxyzine 25mg  daily as needed for annxiety Cymbalta 60mg  daily Wellbutrin XL 450mg  daily - denies seizure history - move to morning 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 Discussed continuing current medication regimen.  Patient advised to contact office with any questions, adverse effects, or acute worsening in signs and symptoms.  Discussed potential metabolic side effects associated with atypical antipsychotics, as well as potential risk for movement side effects. Advised pt to contact office if movement side effects occur.   There are no diagnoses linked to this encounter.   Please see After Visit Summary for patient specific instructions.  Future Appointments  Date Time Provider Department Center  12/31/2023 11:30 AM Brookelin Felber, Thereasa Solo, NP [facility] None  01/26/2024 11:20 AM Jeanella Craze, NP [facility]    No orders of the defined types were placed in this encounter.     -------------------------------

## 2024-01-06 ENCOUNTER — Ambulatory Visit: Admitting: Family

## 2024-01-12 ENCOUNTER — Other Ambulatory Visit (HOSPITAL_COMMUNITY): Payer: Self-pay

## 2024-01-18 ENCOUNTER — Other Ambulatory Visit (HOSPITAL_COMMUNITY): Payer: Self-pay

## 2024-01-19 ENCOUNTER — Other Ambulatory Visit: Payer: Self-pay

## 2024-01-21 NOTE — Progress Notes (Signed)
 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   Since last being seen in our clinic the patient reports she feels she has had increased HR's recently and shortness of breath with exertion. She has a 33 y/o and is active running after him. She has tried exercise but feels so much worse with it.  Her HR's would get into the 140's and she felt as though she would pass out. She has also tried a recumbent bike. She uses salt tablets she got off the POTS website as a recommendation. She feels her symptoms are getting worse as she ages. No recent new stress in life.    She denies chest pain, palpitations, dyspnea, PND, orthopnea, nausea, vomiting, dizziness, syncope, edema, weight gain, or early satiety.   Review of systems complete and found to be negative unless listed in HPI.   EP Information / Studies Reviewed:    EKG is ordered today. Personal review as below.  EKG Interpretation Date/Time:  Tuesday January 26 2024 12:18:37 EDT Ventricular Rate:  100 PR Interval:  142 QRS Duration:  82 QT Interval:  342 QTC Calculation: 441 R Axis:   53  Text Interpretation: Normal sinus rhythm Possible Left atrial enlargement Confirmed by Canary Brim ([id number]) on 01/26/2024 12:19:27 PM    Risk Assessment/Calculations:              Physical Exam:   VS:  BP 122/79 (BP Location: Left Arm, Patient Position: Sitting, Cuff Size: Normal)   Pulse 79   Resp 16   Ht 5\' 4"  (1.626 m)   Wt 173 lb (78.5 kg)   SpO2 99%   BMI 29.70 kg/m    Wt Readings from Last 3 Encounters:  01/26/24 173 lb (78.5 kg)  12/25/23 172 lb (78 kg)  06/16/23 171 lb 12.8 oz (77.9 kg)     GEN: young  adult female, well nourished, well developed in no acute distress NECK: No JVD; No carotid bruits CARDIAC: Regular rate and rhythm, no murmurs, rubs, gallops RESPIRATORY:  Clear to auscultation without rales, wheezing or rhonchi  ABDOMEN: Soft, non-tender, non-distended EXTREMITIES:  No edema; No deformity   ASSESSMENT AND PLAN:    BPPV / POTS  POTS  -assess cardiac monitor to rule out arrhythmia  -pending above, consider low dose Toprol  -EKG reviewed today and in NSR  -encouraged her to limit caffeine > has been drinking ~4 cups of coffee per day > at least 1/2 & 1/2  Pt recommended to get at least 8 g of salt intake per day, consuming 3 L or more of fluid a day.  Consistently exercise 30 to 40 minutes at a heart rate of 70% MPHR at least 5 times a week > use a recumbent machines such as a recumbent bicycle and work on lower body resistance training such as recumbent leg press.  -stay upright as much as possible -add compression stockings  -shift positions, cross/uncross legs as "makeshift" pumping action  -ensure 8 hours sleep each night    Follow up with Dr. Nelly Laurence or EP APP in one month   Signed, Canary Brim, NP-C, AGACNP-BC [facility] - Electrophysiology  01/26/2024, 1:01  PM

## 2024-01-26 ENCOUNTER — Encounter: Payer: Self-pay | Admitting: Pulmonary Disease

## 2024-01-26 ENCOUNTER — Ambulatory Visit: Payer: Self-pay | Attending: Pulmonary Disease | Admitting: Pulmonary Disease

## 2024-01-26 ENCOUNTER — Ambulatory Visit: Attending: Pulmonary Disease

## 2024-01-26 VITALS — BP 122/79 | HR 79 | Resp 16 | Ht 64.0 in | Wt 173.0 lb

## 2024-01-26 DIAGNOSIS — R002 Palpitations: Secondary | ICD-10-CM

## 2024-01-26 DIAGNOSIS — H8113 Benign paroxysmal vertigo, bilateral: Secondary | ICD-10-CM | POA: Diagnosis not present

## 2024-01-26 DIAGNOSIS — G90A Postural orthostatic tachycardia syndrome (POTS): Secondary | ICD-10-CM

## 2024-01-26 NOTE — Patient Instructions (Signed)
 Medication Instructions:  Your physician recommends that you continue on your current medications as directed. Please refer to the Current Medication list given to you today.  *If you need a refill on your cardiac medications before your next appointment, please call your pharmacy*  Lab Work: None ordered If you have labs (blood work) drawn today and your tests are completely normal, you will receive your results only by: MyChart Message (if you have MyChart) OR A paper copy in the mail If you have any lab test that is abnormal or we need to change your treatment, we will call you to review the results.  Testing/Procedures: Julie Webb- Long Term Monitor Instructions  Your physician has requested you wear a ZIO patch monitor for 7 days.  This is a single patch monitor. [employer] supplies one patch monitor per enrollment. Additional stickers are not available. Please do not apply patch if you will be having a Nuclear Stress Test,  Echocardiogram, Cardiac CT, MRI, or Chest Xray during the period you would be wearing the  monitor. The patch cannot be worn during these tests. You cannot remove and re-apply the  ZIO XT patch monitor.  Your ZIO patch monitor will be mailed 3 day USPS to your address on file. It may take 3-5 days  to receive your monitor after you have been enrolled.  Once you have received your monitor, please review the enclosed instructions. Your monitor  has already been registered assigning a specific monitor serial # to you.  Billing and Patient Assistance Program Information  We have supplied [employer] with any of your insurance information on file for billing purposes. [employer] offers a sliding scale Patient Assistance Program for patients that do not have  insurance, or whose insurance does not completely cover the cost of the ZIO monitor.  You must apply for the Patient Assistance Program to qualify for this discounted rate.  To apply, please call [employer] at [phone],  select option 4, select option 2, ask to apply for  Patient Assistance Program. Julie Webb will ask your household income, and how many people  are in your household. They will quote your out-of-pocket cost based on that information.  [employer] will also be able to set up a 21-month, interest-free payment plan if needed.  Applying the monitor   Shave hair from upper left chest.  Hold abrader disc by orange tab. Rub abrader in 40 strokes over the upper left chest as  indicated in your monitor instructions.  Clean area with 4 enclosed alcohol pads. Let dry.  Apply patch as indicated in monitor instructions. Patch will be placed under collarbone on left  side of chest with arrow pointing upward.  Rub patch adhesive wings for 2 minutes. Remove Authement label marked "1". Remove the Dzik  label marked "2". Rub patch adhesive wings for 2 additional minutes.  While looking in a mirror, press and release button in center of patch. A small green light will  flash 3-4 times. This will be your only indicator that the monitor has been turned on.  Do not shower for the first 24 hours. You may shower after the first 24 hours.  Press the button if you feel a symptom. You will hear a small click. Record Date, Time and  Symptom in the Patient Logbook.  When you are ready to remove the patch, follow instructions on the last 2 pages of Patient  Logbook. Stick patch monitor onto the last page of Patient Logbook.  Place Patient Logbook in the  blue and Koska box. Use locking tab on box and tape box closed  securely. The blue and Milich box has prepaid postage on it. Please place it in the mailbox as  soon as possible. Your physician should have your test results approximately 7 days after the  monitor has been mailed back to [facility].  Call [facility] Customer Care at [phone] if you have questions regarding  your ZIO XT patch monitor. Call them immediately if you see an orange light blinking on your   monitor.  If your monitor falls off in less than 4 days, contact our Monitor department at [phone].  If your monitor becomes loose or falls off after 4 days call [employer] at [phone] for  suggestions on securing your monitor   Follow-Up: At [facility], you and your health needs are our priority.  As part of our continuing mission to provide you with exceptional heart care, our providers are all part of one team.  This team includes your primary Cardiologist (physician) and Advanced Practice Providers or APPs (Physician Assistants and Nurse Practitioners) who all work together to provide you with the care you need, when you need it.  Your next appointment:   1 month(s)  Provider:   Canary Brim, NP       1st Floor: - Lobby - Registration  - Pharmacy  - Lab - Cafe  2nd Floor: - PV Lab - Diagnostic Testing (echo, CT, nuclear med)  3rd Floor: - Vacant  4th Floor: - TCTS (cardiothoracic surgery) - AFib Clinic - Structural Heart Clinic - Vascular Surgery  - Vascular Ultrasound  5th Floor: - HeartCare Cardiology (general and EP) - Clinical Pharmacy for coumadin, hypertension, lipid, weight-loss medications, and med management appointments    Valet parking services will be available as well.

## 2024-01-26 NOTE — Progress Notes (Unsigned)
 Enrolled for [employer] to mail a ZIO XT long term holter monitor to the patients address on file.   Dr. Nelly Laurence to read.

## 2024-01-29 ENCOUNTER — Encounter: Payer: Self-pay | Admitting: Family

## 2024-01-29 DIAGNOSIS — R0683 Snoring: Secondary | ICD-10-CM

## 2024-01-29 DIAGNOSIS — R400 Somnolence: Secondary | ICD-10-CM

## 2024-02-02 DIAGNOSIS — F332 Major depressive disorder, recurrent severe without psychotic features: Secondary | ICD-10-CM | POA: Diagnosis not present

## 2024-02-03 ENCOUNTER — Other Ambulatory Visit: Payer: Self-pay

## 2024-02-03 DIAGNOSIS — R0683 Snoring: Secondary | ICD-10-CM

## 2024-02-03 DIAGNOSIS — R400 Somnolence: Secondary | ICD-10-CM

## 2024-02-03 NOTE — Telephone Encounter (Signed)
 Yes I agree lets set up for full night sleep study, please set up office visit 6 weeks to discuss results

## 2024-02-10 ENCOUNTER — Encounter (HOSPITAL_BASED_OUTPATIENT_CLINIC_OR_DEPARTMENT_OTHER): Admitting: Internal Medicine

## 2024-02-11 ENCOUNTER — Other Ambulatory Visit (HOSPITAL_COMMUNITY): Payer: Self-pay

## 2024-02-13 DIAGNOSIS — R002 Palpitations: Secondary | ICD-10-CM | POA: Diagnosis not present

## 2024-02-19 DIAGNOSIS — R002 Palpitations: Secondary | ICD-10-CM

## 2024-02-25 DIAGNOSIS — F332 Major depressive disorder, recurrent severe without psychotic features: Secondary | ICD-10-CM | POA: Diagnosis not present

## 2024-03-03 NOTE — Progress Notes (Unsigned)
  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   Seen in EP Clinic 01/26/24 with reports of increased HR's.  She has exercise limitation with elevated HR's and feeling as if she would pass out. Cardiac monitor was ordered at that time.   Since last being seen in our clinic the patient reports she continues to feel as though she has symptoms of orthostatic dizziness, exercise intolerance.    She denies chest pain, palpitations, dyspnea, PND, orthopnea, nausea, vomiting, dizziness, syncope, edema, weight gain, or early satiety.   Review of systems complete and found to be negative unless listed in HPI.   EP Information / Studies Reviewed:    EKG is not ordered today. EKG from 01/26/24 reviewed which showed NSR 100 bpm      Studies:  Cardiac Monitor 02/15/24 > predominant NSR, HR 55-164 bpm, Ave 89 bpm, <1% atrial ectopy, no ventricular ectopy, no arrhythmias, pt triggered events associated with sinus rhythm           Physical Exam:   VS:  BP 112/76   Pulse 93   Ht 5\' 4"  (1.626 m)   Wt 174 lb 9.6 oz (79.2 kg)   SpO2 96%   BMI 29.97 kg/m    Wt Readings from Last 3 Encounters:  03/04/24 174 lb 9.6 oz (79.2 kg)  01/26/24 173 lb (78.5 kg)  12/25/23 172 lb (78 kg)     GEN: Well nourished, well developed in no acute distress NECK: No JVD; No carotid bruits CARDIAC: Regular rate and rhythm, no murmurs, rubs, gallops RESPIRATORY:  Clear to auscultation without rales, wheezing or rhonchi  ABDOMEN: Soft, non-tender, non-distended EXTREMITIES:  No edema; No deformity   ASSESSMENT AND PLAN:    BPPV / POTS  Palpitations  -cardiac monitor results reviewed  with patient, reassuring  -Discussed starting a slow exercise regimen such as consistently walking for 5 minutes a day for 1 week and adding 5 minutes each week as a gradual progression for cardiovascular conditioning -recommended pt to get at least 8 g of salt intake per day, consuming 3 L or more of fluid a day.  -stay upright as much as possible -shift positions, cross/uncross legs as "makeshift" pumping action  -ensure 8 hours sleep each night  -Patient requested referral to [facility], will refer to Dr. Camille Rrazier-Mills for review   Follow up with Dr. Arlester Ladd in 6 months  Signed, Creighton Doffing, NP-C, AGACNP-BC [facility] - Electrophysiology  03/04/2024, 1:30 PM

## 2024-03-04 ENCOUNTER — Ambulatory Visit: Attending: Pulmonary Disease | Admitting: Pulmonary Disease

## 2024-03-04 ENCOUNTER — Encounter: Payer: Self-pay | Admitting: Pulmonary Disease

## 2024-03-04 VITALS — BP 112/76 | HR 93 | Ht 64.0 in | Wt 174.6 lb

## 2024-03-04 DIAGNOSIS — R002 Palpitations: Secondary | ICD-10-CM | POA: Diagnosis not present

## 2024-03-04 DIAGNOSIS — H8113 Benign paroxysmal vertigo, bilateral: Secondary | ICD-10-CM

## 2024-03-04 DIAGNOSIS — G90A Postural orthostatic tachycardia syndrome (POTS): Secondary | ICD-10-CM | POA: Diagnosis not present

## 2024-03-04 NOTE — Patient Instructions (Signed)
 Medication Instructions:  No medication changes were made during today's visit.   *If you need a refill on your cardiac medications before your next appointment, please call your pharmacy*   Lab Work: No labs were ordered during today's visit.  If you have labs (blood work) drawn today and your tests are completely normal, you will receive your results only by: MyChart Message (if you have MyChart) OR A paper copy in the mail If you have any lab test that is abnormal or we need to change your treatment, we will call you to review the results.   Testing/Procedures: No procedures were ordered during today's visit.    Follow-Up: At [facility], you and your health needs are our priority.  As part of our continuing mission to provide you with exceptional heart care, we have created designated Provider Care Teams.  These Care Teams include your primary Cardiologist (physician) and Advanced Practice Providers (APPs -  Physician Assistants and Nurse Practitioners) who all work together to provide you with the care you need, when you need it.  We recommend signing up for the patient portal called "MyChart".  Sign up information is provided on this After Visit Summary.  MyChart is used to connect with patients for Virtual Visits (Telemedicine).  Patients are able to view lab/test results, encounter notes, upcoming appointments, etc.  Non-urgent messages can be sent to your provider as well.   To learn more about what you can do with MyChart, go to [email or web address].    Your next appointment:   6 month(s)  Provider:   Creighton Doffing          Thank you for choosing [facility]!

## 2024-03-17 ENCOUNTER — Encounter (HOSPITAL_COMMUNITY): Payer: Self-pay

## 2024-03-17 ENCOUNTER — Other Ambulatory Visit (HOSPITAL_COMMUNITY): Payer: Self-pay

## 2024-03-17 ENCOUNTER — Other Ambulatory Visit: Payer: Self-pay | Admitting: Family

## 2024-03-18 ENCOUNTER — Other Ambulatory Visit (HOSPITAL_COMMUNITY): Payer: Self-pay

## 2024-03-24 DIAGNOSIS — F332 Major depressive disorder, recurrent severe without psychotic features: Secondary | ICD-10-CM | POA: Diagnosis not present

## 2024-04-01 ENCOUNTER — Telehealth: Admitting: Adult Health

## 2024-04-11 ENCOUNTER — Other Ambulatory Visit: Payer: Self-pay

## 2024-04-15 ENCOUNTER — Other Ambulatory Visit (HOSPITAL_COMMUNITY): Payer: Self-pay

## 2024-04-19 ENCOUNTER — Telehealth: Admitting: Adult Health

## 2024-04-19 ENCOUNTER — Encounter: Payer: Self-pay | Admitting: Adult Health

## 2024-04-19 DIAGNOSIS — F4010 Social phobia, unspecified: Secondary | ICD-10-CM | POA: Diagnosis not present

## 2024-04-19 DIAGNOSIS — F410 Panic disorder [episodic paroxysmal anxiety] without agoraphobia: Secondary | ICD-10-CM

## 2024-04-19 DIAGNOSIS — F411 Generalized anxiety disorder: Secondary | ICD-10-CM

## 2024-04-19 DIAGNOSIS — F330 Major depressive disorder, recurrent, mild: Secondary | ICD-10-CM

## 2024-04-19 NOTE — Progress Notes (Signed)
 Julie Webb [id number] 12-Dec-1990 32 y.o.  Virtual Visit via Video Note  I connected with pt @ on 04/19/24 at 11:30 AM EDT by a video enabled telemedicine application and verified that I am speaking with the correct person using two identifiers.   I discussed the limitations of evaluation and management by telemedicine and the availability of in person appointments. The patient expressed understanding and agreed to proceed.  I discussed the assessment and treatment plan with the patient. The patient was provided an opportunity to ask questions and all were answered. The patient agreed with the plan and demonstrated an understanding of the instructions.   The patient was advised to call back or seek an in-person evaluation if the symptoms worsen or if the condition fails to improve as anticipated.  I provided 25 minutes of non-face-to-face time during this encounter.  The patient was located at home.  The provider was located at [facility].   Julie LOISE Sayers, NP   Subjective:   Patient ID:  Julie Webb is a 33 y.o. (DOB 12/16/1990) female.  Chief Complaint: No chief complaint on file.   HPI Julie Webb presents for follow-up of SAD, panic disorder, GAD, MDD.  Describes mood today as ok. Pleasant. Tearful at times. Mood symptoms - reports some depression - sometimes gets waves. Reports stable interest and motivation. Reports anxiety - nothing major. Denies irritability. Denies panic attacks. Reports worry, rumination, and over thinking. Reports mood is stable. Stating I feel like I'm doing ok. Feels like medications are helpful. Taking medications as prescribed.  Energy levels average. Active, does not have a regular exercise routine.  Enjoys some usual interests and activities. Married. Lives with husband and son (4 years). Spending time with family. Appetite adequate - decreased GI issues - taking Protonix  and Carafate . Weight loss - 169 from 172  pounds. Sleeps well most nights. Averages 8 hours. Denies daytime napping.  Focus and concentration stable. Completing tasks. Managing aspects of household. 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  Denies SI or HI.  Denies AH or VH. Denies self harm. Denies substance use.   Review of Systems:  Review of Systems  Musculoskeletal:  Negative for gait problem.  Neurological:  Negative for tremors.  Psychiatric/Behavioral:         Please refer to HPI    Medications: I have reviewed the patient's current medications.  Current Outpatient Medications  Medication Sig Dispense Refill   Armodafinil  250 MG tablet Take 1 tablet (250 mg total) by mouth daily. 30 tablet 2   brexpiprazole  (REXULTI ) 1 MG TABS tablet Take 1 and 1/2 tablets (1.5 mg total) by mouth daily. 45 tablet 5   buPROPion  (WELLBUTRIN  XL) 150 MG 24 hr tablet Take 1 tablet (150 mg total) by mouth daily. 90 tablet 1   buPROPion  (WELLBUTRIN  XL) 300 MG 24 hr tablet Take 1 tablet (300 mg total) by mouth daily after breakfast 90 tablet 1   DULoxetine  (CYMBALTA ) 60 MG capsule Take 1 capsule (60 mg total) by mouth daily after breakfast. 90 capsule 1   hydrOXYzine  (ATARAX ) 25 MG tablet Take 1 tablet (25 mg total) by mouth daily. 30 tablet 2   meclizine  (ANTIVERT ) 25 MG tablet Take 1 tablet (25 mg total) by mouth 3 (three) times daily as needed for dizziness. 30 tablet 0   pantoprazole  (PROTONIX ) 40 MG tablet Take 1 tablet (40 mg total) by mouth daily. 30 tablet 5   primidone  (MYSOLINE ) 50 MG tablet  Take 0.5 tablets (25 mg total) by mouth at bedtime. 30 tablet 5   promethazine  (PHENERGAN ) 25 MG tablet Take 1 tablet (25 mg total) by mouth every 8 (eight) hours as needed for nausea or vomiting. 20 tablet 0   scopolamine  (TRANSDERM-SCOP) 1 MG/3DAYS Place 1 patch (1.5 mg total) onto the skin every 3 (three) days. 4 patch 0   sucralfate  (CARAFATE ) 1 g tablet Take 1 tablet (1 g total) by mouth 4 (four) times daily -  with meals and  at bedtime. 120 tablet 5   SUMAtriptan  (IMITREX ) 50 MG tablet Take 1 tablet (50 mg total) by mouth at the onset of a migraine. Repeat in 2 hours. Max of 2 tablets in 24 hours. 12 tablet 6   No current facility-administered medications for this visit.    Medication Side Effects: None  Allergies:  Allergies  Allergen Reactions   Propranolol      Dizziness   Phenergan  [Promethazine  Hcl] Other (See Comments)    Restlessness w/ IV Phenergen. Probable mild EPS. Tolerates if taken w/ Benadryl .     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   Migraines    Vaginal Pap smear, abnormal     Family History  Problem Relation Age of Onset   Hyperlipidemia Mother    Mental illness Mother    Fibroids Mother        uterine   Hypertension Father    Heart attack Father    Heart disease Maternal Grandmother    Hyperlipidemia Maternal Grandmother    Hypertension Maternal Grandmother    Heart disease Maternal Grandfather    Hyperlipidemia Maternal Grandfather    Hypertension Maternal Grandfather    Pancreatic cancer Paternal Grandmother    Mesothelioma Paternal Grandfather     Social History   Socioeconomic History   Marital status: Married    Spouse name: Not on file   Number of children: Not on file   Years of education: Not on file   Highest education level: Bachelor's degree (e.g., BA, AB, BS)  Occupational History   Not on file  Tobacco Use   Smoking status: Never   Smokeless tobacco: Never  Vaping Use   Vaping status: Never Used  Substance and Sexual Activity   Alcohol use: Yes    Comment: occasional   Drug use: No   Sexual activity: Yes    Birth control/protection: I.U.D.  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   Has a son born 11/23/2019   Grew up in [place]   Enjoys reading   Right handed   Has an English Bulldog   3 cups coffee per day, soda every other day      Social Drivers of Health   Financial Resource Strain: Patient Declined (03/24/2023)   Overall Financial Resource Strain (CARDIA)    Difficulty of Paying Living Expenses: Patient declined  Food Insecurity: Patient Declined (03/24/2023)   Hunger Vital Sign    Worried About Running Out of Food in the Last Year: Patient declined    Ran Out of Food in the Last Year: Patient declined  Transportation Needs: Patient Declined (03/24/2023)   PRAPARE -  Administrator, Civil Service (Medical): Patient declined    Lack of Transportation (Non-Medical): Patient declined  Physical Activity: Insufficiently Active (03/24/2023)   Exercise Vital Sign    Days of Exercise per Week: 2 days    Minutes of Exercise per Session: 30 min  Stress: Stress Concern Present (03/24/2023)   [employer] of Occupational Health - Occupational Stress Questionnaire    Feeling of Stress : Rather much  Social Connections: Unknown (03/24/2023)   Social Connection and Isolation Panel    Frequency of Communication with Friends and Family: Patient declined    Frequency of Social Gatherings with Friends and Family: Patient declined    Attends Religious Services: Patient declined    Database administrator or Organizations: Patient declined    Attends Engineer, structural: Not on file    Marital Status: Married  Catering manager Violence: Not on file    Past Medical History, Surgical history, Social history, and Family history were reviewed and updated as appropriate.   Please see review of systems for further details on the patient's review from today.   Objective:   Physical Exam:  There were no vitals taken for this visit.  Physical Exam Constitutional:      General: She is not in acute distress.  Musculoskeletal:        General: No  deformity.   Neurological:     Mental Status: She is alert and oriented to person, place, and time.     Coordination: Coordination normal.   Psychiatric:        Attention and Perception: Attention and perception normal. She does not perceive auditory or visual hallucinations.        Mood and Affect: Mood normal. Mood is not anxious or depressed. Affect is not labile, blunt, angry or inappropriate.        Speech: Speech normal.        Behavior: Behavior normal.        Thought Content: Thought content normal. Thought content is not paranoid or delusional. Thought content does not include homicidal or suicidal ideation. Thought content does not include homicidal or suicidal plan.        Cognition and Memory: Cognition and memory normal.        Judgment: Judgment normal.     Comments: Insight intact     Lab Review:     Component Value Date/Time   NA 137 03/22/2023 1825   K 3.5 03/22/2023 1825   CL 104 03/22/2023 1825   CO2 26 03/22/2023 1825   GLUCOSE 81 03/22/2023 1825   BUN 8 03/22/2023 1825   CREATININE 0.82 03/22/2023 1825   CALCIUM 9.1 03/22/2023 1825   PROT 7.5 03/22/2023 1825   ALBUMIN 4.0 03/22/2023 1825   AST 19 03/22/2023 1825   ALT 16 03/22/2023 1825   ALKPHOS 67 03/22/2023 1825   BILITOT 0.2 (L) 03/22/2023 1825   GFRNONAA >60 03/22/2023 1825   GFRAA >60 06/05/2019 1928       Component Value Date/Time   WBC 6.4 03/22/2023 1825   RBC 4.92 03/22/2023 1825   HGB 14.7 03/22/2023 1825   HCT 44.0 03/22/2023 1825   PLT 247 03/22/2023 1825   MCV 89.4 03/22/2023 1825   MCH 29.9 03/22/2023 1825   MCHC 33.4 03/22/2023 1825   RDW 12.4 03/22/2023 1825   LYMPHSABS 1.6 03/22/2023 1825   MONOABS 0.5 03/22/2023 1825   EOSABS 0.1 03/22/2023 1825   BASOSABS 0.1 03/22/2023 1825  No results found for: POCLITH, LITHIUM   No results found for: PHENYTOIN, PHENOBARB, VALPROATE, CBMZ   .res Assessment: Plan:   Plan:  PDMP reviewed  Hydroxyzine  25mg  daily  as needed for annxiety Cymbalta  60mg  daily Wellbutrin  XL 450mg  daily - denies seizure history - move to morning 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 Discussed continuing current medication regimen.  Patient advised to contact office with any questions, adverse effects, or acute worsening in signs and symptoms.  Discussed potential metabolic side effects associated with atypical antipsychotics, as well as potential risk for movement side effects. Advised pt to contact office if movement side effects occur.   There are no diagnoses linked to this encounter.   Please see After Visit Summary for patient specific instructions.  Future Appointments  Date Time Provider Department Center  04/19/2024 11:30 AM Georgeanne Frankland, Julie Mattocks, NP [facility] None  06/22/2024  8:00 PM Neysa Reggy BIRCH, MD [facility]    No orders of the defined types were placed in this encounter.     -------------------------------

## 2024-04-26 DIAGNOSIS — F332 Major depressive disorder, recurrent severe without psychotic features: Secondary | ICD-10-CM | POA: Diagnosis not present

## 2024-04-29 NOTE — Progress Notes (Unsigned)
 This encounter was created in error - please disregard.

## 2024-05-03 DIAGNOSIS — F332 Major depressive disorder, recurrent severe without psychotic features: Secondary | ICD-10-CM | POA: Diagnosis not present

## 2024-05-10 DIAGNOSIS — F332 Major depressive disorder, recurrent severe without psychotic features: Secondary | ICD-10-CM | POA: Diagnosis not present

## 2024-05-12 ENCOUNTER — Other Ambulatory Visit: Payer: Self-pay

## 2024-05-12 ENCOUNTER — Other Ambulatory Visit: Payer: Self-pay | Admitting: Family

## 2024-05-12 ENCOUNTER — Other Ambulatory Visit (HOSPITAL_COMMUNITY): Payer: Self-pay

## 2024-05-16 ENCOUNTER — Other Ambulatory Visit (HOSPITAL_COMMUNITY): Payer: Self-pay

## 2024-05-16 ENCOUNTER — Other Ambulatory Visit: Payer: Self-pay

## 2024-05-17 ENCOUNTER — Other Ambulatory Visit: Payer: Self-pay

## 2024-05-17 ENCOUNTER — Other Ambulatory Visit (HOSPITAL_COMMUNITY): Payer: Self-pay

## 2024-05-24 DIAGNOSIS — F332 Major depressive disorder, recurrent severe without psychotic features: Secondary | ICD-10-CM | POA: Diagnosis not present

## 2024-06-02 ENCOUNTER — Encounter (HOSPITAL_COMMUNITY): Payer: Self-pay

## 2024-06-02 ENCOUNTER — Other Ambulatory Visit (HOSPITAL_COMMUNITY): Payer: Self-pay

## 2024-06-09 DIAGNOSIS — F332 Major depressive disorder, recurrent severe without psychotic features: Secondary | ICD-10-CM | POA: Diagnosis not present

## 2024-06-10 ENCOUNTER — Other Ambulatory Visit: Payer: Self-pay | Admitting: Adult Health

## 2024-06-10 ENCOUNTER — Other Ambulatory Visit (HOSPITAL_COMMUNITY): Payer: Self-pay

## 2024-06-10 DIAGNOSIS — F330 Major depressive disorder, recurrent, mild: Secondary | ICD-10-CM

## 2024-06-10 DIAGNOSIS — F411 Generalized anxiety disorder: Secondary | ICD-10-CM

## 2024-06-12 ENCOUNTER — Other Ambulatory Visit (HOSPITAL_COMMUNITY): Payer: Self-pay

## 2024-06-12 MED ORDER — BUPROPION HCL ER (XL) 150 MG PO TB24
150.0000 mg | ORAL_TABLET | Freq: Every day | ORAL | 0 refills | Status: DC
  Filled 2024-06-12 – 2024-07-13 (×2): qty 90, 90d supply, fill #0

## 2024-06-12 MED ORDER — BUPROPION HCL ER (XL) 300 MG PO TB24
300.0000 mg | ORAL_TABLET | Freq: Every day | ORAL | 0 refills | Status: DC
  Filled 2024-06-12 – 2024-07-13 (×2): qty 90, 90d supply, fill #0

## 2024-06-13 ENCOUNTER — Other Ambulatory Visit: Payer: Self-pay

## 2024-06-13 ENCOUNTER — Other Ambulatory Visit (HOSPITAL_COMMUNITY): Payer: Self-pay

## 2024-06-22 ENCOUNTER — Ambulatory Visit (HOSPITAL_BASED_OUTPATIENT_CLINIC_OR_DEPARTMENT_OTHER): Attending: Adult Health | Admitting: Internal Medicine

## 2024-06-22 DIAGNOSIS — R0683 Snoring: Secondary | ICD-10-CM | POA: Diagnosis not present

## 2024-06-22 DIAGNOSIS — R400 Somnolence: Secondary | ICD-10-CM | POA: Diagnosis not present

## 2024-06-22 DIAGNOSIS — G4761 Periodic limb movement disorder: Secondary | ICD-10-CM | POA: Diagnosis not present

## 2024-06-25 NOTE — Procedures (Signed)
 Julie Webb [address] [place], [place] [address] Tel: [phone]   Fax: [phone]  Polysomnography Interpretation  Patient Name:  Julie Webb, Julie Webb Study Date:  06/22/2024 Referring Physician:  MADELIN PARRETT [phone]) %%startinterp%% Indications for Polysomnography The patient is a 33 year old Female who is 5' 4 and weighs 174.0 lbs. Her BMI equals 30.1.  A full night polysomnogram was performed to evaluate for -.OSA  No medication was taken.  No Data.   Polysomnogram Data A full night polysomnogram recorded the standard physiologic parameters including EEG, EOG, EMG, EKG, nasal and oral airflow.  Respiratory parameters of chest and abdominal movements were recorded with Respiratory Inductance Plethysmography belts.  Oxygen saturation was recorded by pulse oximetry.   Sleep Architecture The total recording time of the polysomnogram was 393.8 minutes.  The total sleep time was 329.5 minutes.  The patient spent 8.2% of total sleep time in Stage N1, 72.4% in Stage N2, 9.3% in Stages N3, and 10.2% in REM.  Sleep latency was 30.9 minutes.  REM latency was 113.0 minutes.  Sleep Efficiency was 83.7%.  Wake after Sleep Onset time was 33.5 minutes.  Respiratory Events The polysomnogram revealed a presence of 3 obstructive, - central, and - mixed apneas resulting in an Apnea index of 0.5 events per hour.  There were - hypopneas (>=3% desaturation and/or arousal) resulting in an Apnea\Hypopnea Index (AHI >=3% desaturation and/or arousal) of 0.5 events per hour.  There were - hypopneas (>=4% desaturation) resulting in an Apnea\Hypopnea Index (AHI >=4% desaturation) of 0.5 events per hour.  There were 7 Respiratory Effort Related Arousals resulting in a RERA index of 1.3 events per hour. The Respiratory Disturbance Index is 1.8 events per hour.  The snore index was - events per hour.  Mean oxygen saturation was 94.9%.  The lowest oxygen saturation during sleep  was 92.0%.  Time spent <=88% oxygen saturation was - minutes (-).  Limb Activity There were 218 total limb movements recorded, of this total, 187 were classified as PLMs.  PLM index was 34.1 per hour and PLM associated with Arousals index was 11.5 per hour.  Cardiac Summary The average pulse rate was 78.4 bpm.  The minimum pulse rate was 62.0 bpm while the maximum pulse rate was 107.0 bpm.  Cardiac rhythm was normal.  Comments: Occasional apneas and hypopneas, within normal limits, AHI (3%) 0.5/hr. Moderate to loud snoring with oxygen desaturation to a nadir of 92%, mean 94.9%. Periodic limb movement.  Diagnosis: Primary Snoring, Periodic Limb Movement.  Recommendations: Measures to address snoring based on clinical judgment. Consider therapy for limb movement sleep disturbance.   This study was personally reviewed and electronically signed by: NEYSA RAMA, MD Accredited Board Certified in Sleep Medicine Date/Time: 06/25/24   12:52    %%endinterp%%   Diagnostic PSG Report  Patient Name: Julie Webb Study Date: 06/22/2024  Date of Birth: 11-07-1990 Study Type: Diagnostic  Age: 54 year MRN #: [id number]  Sex: Female Interpreting Physician: NEYSA RAMA, [id number]  Height: 5' 4 Referring Physician: MADELIN STANK [phone])  Weight: 174.0 lbs Recording Tech: Hargis Abu RPSGT RST  BMI: 30.1 Scoring Tech: Hargis Abu RPSGT RST  ESS: 5 Neck Size: 14.5   Study Overview  Lights Off: 10:07:01 PM  Count Index  Lights On: 04:40:49 AM Awakenings: 38 6.9  Time in Bed: 393.8 min. Arousals: 300 54.6  Total Sleep Time: 329.5 min. AHI (>=3% Desat and/or Ar.): 3 0.5   Sleep Efficiency: 83.7% AHI (>=4% Desat):  3 0.5   Sleep Latency: 30.9 min. Limb Movements: 218 39.7  Wake After Sleep Onset: 33.5 min. Snore: - -  REM Latency from Sleep Onset: 113.0 min. Desaturations: 7 1.3     Minimum SpO2 TST: 92.0%    Sleep Architecture  % of Time in Bed Stages Time (mins) % Sleep Time  Wake 64.5   Stage  N1 27.0 8.2%  Stage N2 238.5 72.4%  Stage N3 30.5 9.3%  REM 33.5 10.2%   Arousal Summary   NREM REM Sleep Index  Respiratory Arousals 6 2 8  1.5  PLM Arousals 63 - 63 11.5  Isolated Limb Movement Arousals 9 - 9 1.6  Snore Arousals - - - -  Spontaneous Arousals 196 27 223 40.6  Total 271 29 300 54.6   Limb Movement Summary   Count Index  Isolated Limb Movements 31 5.6  Periodic Limb Movements (PLMs) 187 34.1  Total Limb Movements 218 39.7    Respiratory Summary   By Sleep Stage By Body Position Total   NREM REM Supine Non-Supine   Time (min) 296.0 33.5 229.5 100.0 329.5         Obstructive Apnea 2 1 3  - 3  Mixed Apnea - - - - -  Central Apnea - - - - -  Total Apneas 2 1 3  - 3  Total Apnea Index 0.4 1.8 0.8 - 0.5         Hypopneas (>=3% Desat and/or Ar.) - - - - -  AHI (>=3% Desat and/or Ar.) 0.4 1.8 0.8 - 0.5         Hypopneas (>=4% Desat) - - - - -  AHI (>=4% Desat) 0.4 1.8 0.8 - 0.5          RERAs 6 1 7  - 7  RERA Index 1.2 1.8 1.8 - 1.3         RDI 1.6 3.6 2.6 - 1.8    Respiratory Event Type Index  Central Apneas -  Obstructive Apneas 0.5  Mixed Apneas -  Central Hypopneas -  Obstructive Hypopneas -  Central Apnea + Hypopnea (CAHI) -  Obstructive Apnea + Hypopnea (OAHI) 0.5   Respiratory Event Durations   Apnea Hypopnea   NREM REM NREM REM  Average (seconds) 14.4 14.1 - -  Maximum (seconds) 19.0 14.1 - -    Oxygen Saturation Summary   Wake NREM REM TST TIB  Average SpO2 (%) 95.6% 94.8% 94.7% 94.8% 94.9%  Minimum SpO2 (%) 92.0% 92.0% 92.0% 92.0% 92.0%  Maximum SpO2 (%) 98.0% 97.0% 97.0% 97.0% 98.0%   Oxygen Saturation Distribution  Range (%) Time in range (min) Time in range (%)  90.0 - 100.0 385.0 98.8%  80.0 - 90.0 - -  70.0 - 80.0 - -  60.0 - 70.0 - -  50.0 - 60.0 - -  0.0 - 50.0 - -  Time Spent <=88% SpO2  Range (%) Time in range (min) Time in range (%)  0.0 - 88.0 - -      Count Index  Desaturations 7 1.3    Cardiac  Summary   Wake NREM REM Sleep Total  Average Pulse Rate (BPM) 86.5 77.0 75.7 76.8 78.4  Minimum Pulse Rate (BPM) 64.0 62.0 65.0 62.0 62.0  Maximum Pulse Rate (BPM) 107.0 107.0 96.0 107.0 107.0   Pulse Rate Distribution:  Range (bpm) Time in range (min) Time in range (%)  0.0 - 40.0 - -  40.0 - 60.0 - -  60.0 -  80.0 260.9 66.8%  80.0 - 100.0 125.5 32.1%  100.0 - 120.0 1.7 0.4%  120.0 - 140.0 - -  140.0 - 200.0 - -      Hypnograms                      Technologist Comments  Patient was at Sleep Lab for Snoring.  A PSG Study was ordered. Respiratory events were noted.  Snoring was moderate to loud. Periodic Limb Movement was noted with/without arousals.  EKG showed NSR.  No medication was taken. No oxygen was applied. No restroom visited.                           Reggy Salt Diplomate, Biomedical engineer of Sleep Medicine  ELECTRONICALLY SIGNED ON:  06/25/2024, 12:46 PM [facility] SLEEP DISORDERS Webb PH: [phone]   FX: [phone] ACCREDITED BY THE AMERICAN ACADEMY OF SLEEP MEDICINE

## 2024-06-25 NOTE — Procedures (Signed)
  Indications for Polysomnography The patient is a 33 year old Female who is 5' 4 and weighs 174.0 lbs. Her BMI equals 30.1.  A full night polysomnogram was performed to evaluate for -.  No medication was taken.No Data. Polysomnogram Data A full night polysomnogram recorded the standard physiologic parameters including EEG, EOG, EMG, EKG, nasal and oral airflow.  Respiratory parameters of chest and abdominal movements were recorded with Respiratory Inductance Plethysmography belts.   Oxygen saturation was recorded by pulse oximetry.  Sleep Architecture The total recording time of the polysomnogram was 393.8 minutes.  The total sleep time was 329.5 minutes.  The patient spent 8.2% of total sleep time in Stage N1, 72.4% in Stage N2, 9.3% in Stages N3, and 10.2% in REM.  Sleep latency was 30.9 minutes.   REM latency was 113.0 minutes.  Sleep Efficiency was 83.7%.  Wake after Sleep Onset time was 33.5 minutes.  Respiratory Events The polysomnogram revealed a presence of 3 obstructive, - central, and - mixed apneas resulting in an Apnea index of 0.5 events per hour.  There were - hypopneas (GreaterEqual to3% desaturation and/or arousal) resulting in an Apnea\Hypopnea Index (AHI  GreaterEqual to3% desaturation and/or arousal) of 0.5 events per hour.  There were - hypopneas (GreaterEqual to4% desaturation) resulting in an Apnea\Hypopnea Index (AHI GreaterEqual to4% desaturation) of 0.5 events per hour.  There were 7 Respiratory  Effort Related Arousals resulting in a RERA index of 1.3 events per hour. The Respiratory Disturbance Index is 1.8 events per hour.  The snore index was - events per hour.  Mean oxygen saturation was 94.9%.  The lowest oxygen saturation during sleep was 92.0%.  Time spent LessEqual to88% oxygen saturation was  minutes ().  Limb Activity There were 218 total limb movements recorded, of this total, 187 were classified as PLMs.  PLM index was 34.1 per hour and PLM associated with  Arousals index was 11.5 per hour.  Cardiac Summary The average pulse rate was 78.4 bpm.  The minimum pulse rate was 62.0 bpm while the maximum pulse rate was 107.0 bpm.  Cardiac rhythm was normal/abnormal.  Comments:  Diagnosis:  Recommendations:   This study was personally reviewed and electronically signed by: NEYSA RAMA, MD Accredited Board Certified in Sleep Medicine Date/Time:

## 2024-06-27 ENCOUNTER — Ambulatory Visit: Payer: Self-pay | Admitting: Adult Health

## 2024-06-28 ENCOUNTER — Ambulatory Visit (INDEPENDENT_AMBULATORY_CARE_PROVIDER_SITE_OTHER): Admitting: Adult Health

## 2024-06-28 ENCOUNTER — Encounter: Payer: Self-pay | Admitting: Adult Health

## 2024-06-28 VITALS — BP 120/74 | HR 95 | Ht 64.0 in | Wt 187.4 lb

## 2024-06-28 DIAGNOSIS — R0683 Snoring: Secondary | ICD-10-CM | POA: Diagnosis not present

## 2024-06-28 DIAGNOSIS — G4733 Obstructive sleep apnea (adult) (pediatric): Secondary | ICD-10-CM

## 2024-06-28 DIAGNOSIS — F332 Major depressive disorder, recurrent severe without psychotic features: Secondary | ICD-10-CM | POA: Diagnosis not present

## 2024-06-28 DIAGNOSIS — R400 Somnolence: Secondary | ICD-10-CM

## 2024-06-28 NOTE — Progress Notes (Signed)
 @Patient  ID: Julie Webb, female    DOB: 30-Jun-1991, 33 y.o.   MRN: [id number]  Chief Complaint  Patient presents with   Snoring   Follow-up    Review sleep study    Referring provider: Daryl Setter, NP  HPI: 33 yo female seen for sleep consult January 2024 for snoring and daytime sleepiness.  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 This showed no significant sleep apnea with AHI at 1.9/hour and SpO2 low at 98%.   06/28/2024 Follow up : Snoring , Daytime sleepiness , RLS  Discussed the use of AI scribe software for clinical note transcription with the patient, who gave verbal consent to proceed.  History of Present Illness Julie Webb is a 33 year old female who presents with persistent snoring and sleep disturbances.  She has been experiencing significant sleep disturbances characterized by snoring and excessive daytime sleepiness. A home sleep study conducted last year did not indicate sleep apnea but did show snoring. She used her significant others back up CPAP for last 6-7 months , which has significantly improved her symptoms, including her energy levels and daytime functionality.   Prior to using the CPAP machine, she was sleeping 18 to 19 hours a day and experiencing episodes of jerking movements during sleep, resembling seizures. These symptoms have resolved with the use of the CPAP machine, and she now feels 'like a different person.'  An in-lab sleep study was conducted recently that was negative for sleep apnea. Was positive for snoring and restless leg syndrome.  Despite the lack of a formal diagnosis of sleep apnea, the CPAP machine has alleviated her symptoms, including the restless leg movements. She requests a prescription for CPAP to pay for out of pocket.   She is currently on medications including Cymbalta , Wellbutrin , and primidone , prescribed by her psychiatrist at [facility]. She has  previously been on gabapentin for anxiety,     Allergies  Allergen Reactions   Propranolol      Dizziness   Phenergan  [Promethazine  Hcl] Other (See Comments)    Restlessness w/ IV Phenergen. Probable mild EPS. Tolerates if taken w/ Benadryl .     Immunization History  Administered Date(s) Administered   DTaP 11/09/1991, 05/09/1992, 08/10/1992, 06/25/1993, 03/23/1996   HIB (PRP-OMP) 11/09/1991, 05/09/1992, 08/10/1992, 06/25/1993   HPV Quadrivalent 10/22/2006, 12/25/2006, 05/13/2007   Hepatitis A 10/22/2006, 05/13/2007   Hepatitis B 03/11/1994, 03/23/1996, 07/21/1996   Hepb-cpg 10/29/2021   Influenza, Seasonal, Injecte, Preservative Fre 07/21/2023   Influenza,inj,Quad PF,6+ Mos 07/06/2015, 07/13/2017, 08/14/2022, 08/15/2022   Influenza-Unspecified 07/16/2012, 07/13/2018, 07/13/2021   MMR 06/25/1993, 03/23/1996   Meningococcal Conjugate 10/22/2006   PFIZER(Purple Top)SARS-COV-2 Vaccination 01/20/2020, 02/10/2020, 10/31/2020   PPD Test 03/30/2012, 04/06/2012   Pfizer(Comirnaty )Fall Seasonal Vaccine 12 years and older 09/02/2022, 07/21/2023   Td 05/13/2007, 12/04/2017   Tdap 05/13/2007    Past Medical History:  Diagnosis Date   Anxiety    Depression    Family history of adverse reaction to anesthesia    mother- N/V    Glomerulonephritis, poststreptococcal    age 53   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   Migraines    Vaginal Pap smear, abnormal     Tobacco History: Social History   Tobacco Use  Smoking Status Never  Smokeless Tobacco Never   Counseling given: Not Answered   Outpatient Medications Prior to Visit  Medication Sig Dispense Refill   Armodafinil  250 MG  tablet Take 1 tablet (250 mg total) by mouth daily. 30 tablet 2   brexpiprazole  (REXULTI ) 1 MG TABS tablet Take 1 and 1/2 tablets (1.5 mg total) by mouth daily. 45 tablet 5   buPROPion  (WELLBUTRIN  XL) 150 MG 24 hr tablet Take 1 tablet (150 mg total) by mouth daily. 90 tablet 0   buPROPion  (WELLBUTRIN   XL) 300 MG 24 hr tablet Take 1 tablet (300 mg total) by mouth daily after breakfast 90 tablet 0   DULoxetine  (CYMBALTA ) 60 MG capsule Take 1 capsule (60 mg total) by mouth daily after breakfast. 90 capsule 1   hydrOXYzine  (ATARAX ) 25 MG tablet Take 1 tablet (25 mg total) by mouth daily. 30 tablet 2   meclizine  (ANTIVERT ) 25 MG tablet Take 1 tablet (25 mg total) by mouth 3 (three) times daily as needed for dizziness. 30 tablet 0   pantoprazole  (PROTONIX ) 40 MG tablet Take 1 tablet (40 mg total) by mouth daily. 30 tablet 5   primidone  (MYSOLINE ) 50 MG tablet Take 0.5 tablets (25 mg total) by mouth at bedtime. 30 tablet 5   promethazine  (PHENERGAN ) 25 MG tablet Take 1 tablet (25 mg total) by mouth every 8 (eight) hours as needed for nausea or vomiting. 20 tablet 0   scopolamine  (TRANSDERM-SCOP) 1 MG/3DAYS Place 1 patch (1.5 mg total) onto the skin every 3 (three) days. 4 patch 0   sucralfate  (CARAFATE ) 1 g tablet Take 1 tablet (1 g total) by mouth 4 (four) times daily -  with meals and at bedtime. 120 tablet 5   SUMAtriptan  (IMITREX ) 50 MG tablet Take 1 tablet (50 mg total) by mouth at the onset of a migraine. Repeat in 2 hours. Max of 2 tablets in 24 hours. 12 tablet 6   No facility-administered medications prior to visit.     Review of Systems:   Constitutional:   No  weight loss, night sweats,  Fevers, chills, +fatigue, or  lassitude.  HEENT:   No headaches,  Difficulty swallowing,  Tooth/dental problems, or  Sore throat,                No sneezing, itching, ear ache, nasal congestion, post nasal drip,   CV:  No chest pain,  Orthopnea, PND, swelling in lower extremities, anasarca, dizziness, palpitations, syncope.   GI  No heartburn, indigestion, abdominal pain, nausea, vomiting, diarrhea, change in bowel habits, loss of appetite, bloody stools.   Resp: No shortness of breath with exertion or at rest.  No excess mucus, no productive cough,  No non-productive cough,  No coughing up of blood.   No change in color of mucus.  No wheezing.  No chest wall deformity  Skin: no rash or lesions.  GU: no dysuria, change in color of urine, no urgency or frequency.  No flank pain, no hematuria   MS:  No joint pain or swelling.  No decreased range of motion.  No back pain.    Physical Exam  BP 120/74 (BP Location: Right Arm, Patient Position: Sitting, Cuff Size: Normal)   Pulse 95   Ht 5' 4 (1.626 m)   Wt 187 lb 6.4 oz (85 kg)   SpO2 98% Comment: RA  BMI 32.17 kg/m   GEN: A/Ox3; pleasant , NAD, well nourished    HEENT:  Nectar/AT,  NOSE-clear, THROAT-clear, no lesions, no postnasal drip or exudate noted.   NECK:  Supple w/ fair ROM; no JVD; normal carotid impulses w/o bruits; no thyromegaly or nodules palpated; no lymphadenopathy.  RESP  Clear  P & A; w/o, wheezes/ rales/ or rhonchi. no accessory muscle use, no dullness to percussion  CARD:  RRR, no m/r/g, no peripheral edema, pulses intact, no cyanosis or clubbing.  GI:   Soft & nt; nml bowel sounds; no organomegaly or masses detected.   Musco: Warm bil, no deformities or joint swelling noted.   Neuro: alert, no focal deficits noted.    Skin: Warm, no lesions or rashes    Lab Results:  CBC    Component Value Date/Time   WBC 6.4 03/22/2023 1825   RBC 4.92 03/22/2023 1825   HGB 14.7 03/22/2023 1825   HCT 44.0 03/22/2023 1825   PLT 247 03/22/2023 1825   MCV 89.4 03/22/2023 1825   MCH 29.9 03/22/2023 1825   MCHC 33.4 03/22/2023 1825   RDW 12.4 03/22/2023 1825   LYMPHSABS 1.6 03/22/2023 1825   MONOABS 0.5 03/22/2023 1825   EOSABS 0.1 03/22/2023 1825   BASOSABS 0.1 03/22/2023 1825    BMET    Component Value Date/Time   NA 137 03/22/2023 1825   K 3.5 03/22/2023 1825   CL 104 03/22/2023 1825   CO2 26 03/22/2023 1825   GLUCOSE 81 03/22/2023 1825   BUN 8 03/22/2023 1825   CREATININE 0.82 03/22/2023 1825   CALCIUM 9.1 03/22/2023 1825   GFRNONAA >60 03/22/2023 1825   GFRAA >60 06/05/2019 1928    BNP No  results found for: BNP  ProBNP No results found for: PROBNP  Imaging: No results found.  Administration History     None           No data to display          No results found for: NITRICOXIDE      Assessment & Plan:   No problem-specific Assessment & Plan notes found for this encounter.  Assessment and Plan Assessment & Plan Snoring with associated restless legs syndrome, improved with CPAP therapy   Snoring and restless legs syndrome have significantly improved with CPAP therapy, even without a sleep apnea diagnosis. The off-label use of CPAP has enhanced sleep quality, reduced daytime sleepiness, and resolved restless leg symptoms. She has used a backup CPAP machine for six to seven months, improving her quality of life. Insurance does not cover CPAP without a sleep apnea diagnosis, so she is considering paying out of pocket. An orthodontic device was considered but remains uncertain due to cost and efficacy. Restless leg syndrome may be exacerbated by iron deficiency, and current medications may contribute to daytime sleepiness. Provide an order for CPAP machine for off-label use for snoring . Begin Auto CPAP 5-15cmH2o. . Discuss with her primary care provider about iron levels and consider an iron panel to rule out iron deficiency as a cause of restless leg syndrome. Recommend follow-up in three to four months for reassessment. Case discussed with Dr. Neda.   Obesity with BMI 32- continue with healthy weight loss   Plan  Patient Instructions  Can continue on CPAP At bedtime. -This is consider off label .  Work on healthy sleep regimen  Healthy weight loss  Do not drive if sleepy  Use caution with sedating medications  Follow up with PCP for labs, need have iron levels checked.  Order for CPAP.  Follow up in 3-4 months and As needed          Madelin Stank, NP 06/28/2024

## 2024-06-28 NOTE — Patient Instructions (Signed)
 Can continue on CPAP At bedtime. -This is consider off label .  Work on healthy sleep regimen  Healthy weight loss  Do not drive if sleepy  Use caution with sedating medications  Follow up with PCP for labs, need have iron levels checked.  Order for CPAP.  Follow up in 3-4 months and As needed

## 2024-07-07 ENCOUNTER — Other Ambulatory Visit (HOSPITAL_COMMUNITY): Payer: Self-pay

## 2024-07-08 ENCOUNTER — Other Ambulatory Visit (HOSPITAL_COMMUNITY): Payer: Self-pay

## 2024-07-08 MED ORDER — PANTOPRAZOLE SODIUM 40 MG PO TBEC
40.0000 mg | DELAYED_RELEASE_TABLET | Freq: Two times a day (BID) | ORAL | 2 refills | Status: DC
  Filled 2024-07-08: qty 60, 30d supply, fill #0

## 2024-07-11 ENCOUNTER — Other Ambulatory Visit (HOSPITAL_COMMUNITY): Payer: Self-pay

## 2024-07-11 ENCOUNTER — Other Ambulatory Visit: Payer: Self-pay | Admitting: Adult Health

## 2024-07-11 DIAGNOSIS — F411 Generalized anxiety disorder: Secondary | ICD-10-CM

## 2024-07-11 DIAGNOSIS — F330 Major depressive disorder, recurrent, mild: Secondary | ICD-10-CM

## 2024-07-12 ENCOUNTER — Other Ambulatory Visit (HOSPITAL_COMMUNITY): Payer: Self-pay

## 2024-07-12 MED ORDER — BREXPIPRAZOLE 1 MG PO TABS
1.5000 mg | ORAL_TABLET | Freq: Every day | ORAL | 1 refills | Status: DC
  Filled 2024-07-12: qty 45, 30d supply, fill #0
  Filled 2024-08-11: qty 45, 30d supply, fill #1

## 2024-07-13 ENCOUNTER — Other Ambulatory Visit (HOSPITAL_COMMUNITY): Payer: Self-pay

## 2024-07-13 ENCOUNTER — Other Ambulatory Visit: Payer: Self-pay

## 2024-07-13 ENCOUNTER — Other Ambulatory Visit: Payer: Self-pay | Admitting: Adult Health

## 2024-07-13 DIAGNOSIS — F330 Major depressive disorder, recurrent, mild: Secondary | ICD-10-CM

## 2024-07-13 DIAGNOSIS — F411 Generalized anxiety disorder: Secondary | ICD-10-CM

## 2024-07-13 DIAGNOSIS — F410 Panic disorder [episodic paroxysmal anxiety] without agoraphobia: Secondary | ICD-10-CM

## 2024-07-13 DIAGNOSIS — F4010 Social phobia, unspecified: Secondary | ICD-10-CM

## 2024-07-13 MED ORDER — DULOXETINE HCL 60 MG PO CPEP
60.0000 mg | ORAL_CAPSULE | Freq: Every day | ORAL | 0 refills | Status: DC
  Filled 2024-07-13: qty 30, 30d supply, fill #0

## 2024-07-14 DIAGNOSIS — F332 Major depressive disorder, recurrent severe without psychotic features: Secondary | ICD-10-CM | POA: Diagnosis not present

## 2024-07-27 ENCOUNTER — Other Ambulatory Visit: Payer: Self-pay

## 2024-07-27 ENCOUNTER — Other Ambulatory Visit (HOSPITAL_COMMUNITY): Payer: Self-pay

## 2024-07-27 DIAGNOSIS — K219 Gastro-esophageal reflux disease without esophagitis: Secondary | ICD-10-CM | POA: Diagnosis not present

## 2024-07-27 DIAGNOSIS — K5900 Constipation, unspecified: Secondary | ICD-10-CM | POA: Diagnosis not present

## 2024-07-27 MED ORDER — SUCRALFATE 1 G PO TABS
1.0000 g | ORAL_TABLET | Freq: Four times a day (QID) | ORAL | 3 refills | Status: DC
  Filled 2024-07-27: qty 360, 90d supply, fill #0

## 2024-07-27 MED ORDER — PANTOPRAZOLE SODIUM 40 MG PO TBEC
40.0000 mg | DELAYED_RELEASE_TABLET | Freq: Two times a day (BID) | ORAL | 3 refills | Status: AC
  Filled 2024-07-27 – 2024-08-25 (×2): qty 180, 90d supply, fill #0
  Filled 2024-11-18: qty 180, 90d supply, fill #1

## 2024-07-28 DIAGNOSIS — F332 Major depressive disorder, recurrent severe without psychotic features: Secondary | ICD-10-CM | POA: Diagnosis not present

## 2024-08-08 ENCOUNTER — Other Ambulatory Visit (HOSPITAL_COMMUNITY): Payer: Self-pay

## 2024-08-10 ENCOUNTER — Ambulatory Visit (INDEPENDENT_AMBULATORY_CARE_PROVIDER_SITE_OTHER): Admitting: Family

## 2024-08-10 ENCOUNTER — Other Ambulatory Visit (HOSPITAL_COMMUNITY): Payer: Self-pay

## 2024-08-10 ENCOUNTER — Telehealth (HOSPITAL_COMMUNITY): Payer: Self-pay

## 2024-08-10 ENCOUNTER — Other Ambulatory Visit: Payer: Self-pay

## 2024-08-10 VITALS — BP 119/69 | HR 84 | Temp 98.8°F | Resp 16 | Ht 64.0 in | Wt 188.2 lb

## 2024-08-10 DIAGNOSIS — G250 Essential tremor: Secondary | ICD-10-CM

## 2024-08-10 DIAGNOSIS — G2581 Restless legs syndrome: Secondary | ICD-10-CM | POA: Diagnosis not present

## 2024-08-10 DIAGNOSIS — G43709 Chronic migraine without aura, not intractable, without status migrainosus: Secondary | ICD-10-CM | POA: Diagnosis not present

## 2024-08-10 DIAGNOSIS — Z23 Encounter for immunization: Secondary | ICD-10-CM

## 2024-08-10 DIAGNOSIS — G43809 Other migraine, not intractable, without status migrainosus: Secondary | ICD-10-CM

## 2024-08-10 DIAGNOSIS — Z0000 Encounter for general adult medical examination without abnormal findings: Secondary | ICD-10-CM | POA: Diagnosis not present

## 2024-08-10 DIAGNOSIS — G90A Postural orthostatic tachycardia syndrome (POTS): Secondary | ICD-10-CM | POA: Diagnosis not present

## 2024-08-10 DIAGNOSIS — N393 Stress incontinence (female) (male): Secondary | ICD-10-CM | POA: Diagnosis not present

## 2024-08-10 DIAGNOSIS — R635 Abnormal weight gain: Secondary | ICD-10-CM

## 2024-08-10 LAB — CBC WITH DIFFERENTIAL/PLATELET
Basophils Absolute: 0 K/uL (ref 0.0–0.1)
Basophils Relative: 0.7 % (ref 0.0–3.0)
Eosinophils Absolute: 0.1 K/uL (ref 0.0–0.7)
Eosinophils Relative: 0.9 % (ref 0.0–5.0)
HCT: 43 % (ref 36.0–46.0)
Hemoglobin: 14.3 g/dL (ref 12.0–15.0)
Lymphocytes Relative: 27.2 % (ref 12.0–46.0)
Lymphs Abs: 1.6 K/uL (ref 0.7–4.0)
MCHC: 33.2 g/dL (ref 30.0–36.0)
MCV: 88.3 fl (ref 78.0–100.0)
Monocytes Absolute: 0.5 K/uL (ref 0.1–1.0)
Monocytes Relative: 7.7 % (ref 3.0–12.0)
Neutro Abs: 3.8 K/uL (ref 1.4–7.7)
Neutrophils Relative %: 63.5 % (ref 43.0–77.0)
Platelets: 247 K/uL (ref 150.0–400.0)
RBC: 4.87 Mil/uL (ref 3.87–5.11)
RDW: 13.1 % (ref 11.5–15.5)
WBC: 6 K/uL (ref 4.0–10.5)

## 2024-08-10 LAB — COMPREHENSIVE METABOLIC PANEL WITH GFR
ALT: 11 U/L (ref 0–35)
AST: 12 U/L (ref 0–37)
Albumin: 4.4 g/dL (ref 3.5–5.2)
Alkaline Phosphatase: 63 U/L (ref 39–117)
BUN: 10 mg/dL (ref 6–23)
CO2: 28 meq/L (ref 19–32)
Calcium: 9.4 mg/dL (ref 8.4–10.5)
Chloride: 105 meq/L (ref 96–112)
Creatinine, Ser: 0.85 mg/dL (ref 0.40–1.20)
GFR: 90.23 mL/min (ref >60.00)
Glucose, Bld: 92 mg/dL (ref 70–99)
Potassium: 4.5 meq/L (ref 3.5–5.1)
Sodium: 139 meq/L (ref 135–145)
Total Bilirubin: 0.3 mg/dL (ref 0.2–1.2)
Total Protein: 7 g/dL (ref 6.0–8.3)

## 2024-08-10 LAB — IBC + FERRITIN
Ferritin: 35.7 ng/mL (ref 10.0–291.0)
Iron: 35 ug/dL — ABNORMAL LOW (ref 42–145)
Saturation Ratios: 10 % — ABNORMAL LOW (ref 20.0–50.0)
TIBC: 351.4 ug/dL (ref 250.0–450.0)
Transferrin: 251 mg/dL (ref 212.0–360.0)

## 2024-08-10 LAB — TSH: TSH: 2.98 u[IU]/mL (ref 0.35–5.50)

## 2024-08-10 MED ORDER — NURTEC 75 MG PO TBDP
1.0000 | ORAL_TABLET | ORAL | 5 refills | Status: AC
  Filled 2024-08-10: qty 15, 30d supply, fill #0
  Filled 2024-09-06: qty 15, 30d supply, fill #1
  Filled 2024-10-12: qty 15, 30d supply, fill #2
  Filled 2024-11-18: qty 15, 30d supply, fill #3

## 2024-08-10 MED ORDER — PRIMIDONE 50 MG PO TABS
25.0000 mg | ORAL_TABLET | Freq: Every day | ORAL | 5 refills | Status: DC
  Filled 2024-08-10: qty 30, 60d supply, fill #0

## 2024-08-10 NOTE — Telephone Encounter (Signed)
 Pharmacy Patient Advocate Encounter  Received notification from [facility] that Prior Authorization for Nurtec 75MG  dispersible tablets  has been APPROVED from 08/10/24 to 02/07/25. Ran test claim, Copay is $0 with the copay card, without the copay is $55.50. This test claim was processed through [facility]- copay amounts may vary at other pharmacies due to pharmacy/plan contracts, or as the patient moves through the different stages of their insurance plan.   PA #/Case ID/Reference #: [id number]

## 2024-08-10 NOTE — Telephone Encounter (Signed)
 PA request has been Received. New Encounter has been or will be created for follow up. For additional info see Pharmacy Prior Auth telephone encounter from 08/10/24.

## 2024-08-10 NOTE — Assessment & Plan Note (Signed)
 Uncontrolled, resume primidone .

## 2024-08-10 NOTE — Assessment & Plan Note (Addendum)
  Discussed health maintenance, weight gain likely due to decreased activity. Continue to work on improving diet and encouraged exercise as tolerated - Administer flu shot. - Order thyroid  function test to evaluate weight gain. - Pap up to date

## 2024-08-10 NOTE — Assessment & Plan Note (Signed)
 Failed pelvic floor PT. Refer to Urogynecology for further evaluation.

## 2024-08-10 NOTE — Progress Notes (Signed)
 Subjective:     Patient ID: Julie Webb, female    DOB: Jun 03, 1991, 33 y.o.   MRN: [id number]  Chief Complaint  Patient presents with   Annual Exam    HPI  Discussed the use of AI scribe software for clinical note transcription with the patient, who gave verbal consent to proceed.  History of Present Illness  Julie Webb is a 33 year old female who presents for an annual physical exam.  She manages depression and anxiety with Rexulti  1.5 mg, wellbutrin  and cymbalta  which are being managed by psychiatry. Migraines occur frequently in clusters, managed with Imitrex , though she sometimes forgets to take it. She has POTS (a new diagnosis since last visit), managed with increased sodium intake and sodium tablets, avoiding propranolol  due to dizziness and hypotension. She reports generally low blood pressure. Weight gain is attributed to a sedentary job and exercise challenges due to POTS.  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 She has hx gastritis managed with Protonix  and Carafate , with bowel movements every three days. Stress incontinence occurs post-childbirth, with frequent urination and occasional accidents, despite pelvic floor therapy. Restless leg syndrome is managed with a CPAP machine, despite no sleep apnea. She has a long-standing issue with right hip pain since high school.  Immunizations:  flu shot today Diet: needs improvement Exercise: more sedentary job.  Has had weight gain Pap Smear: neg 12/24 Vision:  scheduled Dental: scheduled      Health Maintenance Due  Topic Date Due   COVID-19 Vaccine (6 - 2025-26 season) 06/20/2024    Past Medical History:  Diagnosis Date   Anxiety    Depression    Family history of adverse reaction to anesthesia    mother- N/V    Glomerulonephritis, poststreptococcal    age 69   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   Migraines    Vaginal Pap smear, abnormal      Past Surgical History:  Procedure Laterality Date   CHOLECYSTECTOMY Right 09/19/2020    Family History  Problem Relation Age of Onset   Hyperlipidemia Mother    Mental illness Mother    Fibroids Mother        uterine   Hypertension Father    Heart attack Father    Heart disease Maternal Grandmother    Hyperlipidemia Maternal Grandmother    Hypertension Maternal Grandmother    Heart disease Maternal Grandfather    Hyperlipidemia Maternal Grandfather    Hypertension Maternal Grandfather    Pancreatic cancer Paternal Grandmother    Mesothelioma Paternal Grandfather     Social History   Socioeconomic History   Marital status: Married    Spouse name: Not on file   Number of children: Not on file   Years of education: Not on file   Highest education level: Bachelor's degree (e.g., BA, AB, BS)  Occupational History   Not on file  Tobacco Use   Smoking status: Never   Smokeless tobacco: Never  Vaping Use   Vaping status: Never Used  Substance and Sexual Activity   Alcohol use: Yes    Comment: occasional   Drug use: No   Sexual activity: Yes    Birth control/protection: I.U.D.  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   Has a son born 11/23/2019   Grew up in [place]   Enjoys reading   Right handed   Has an [place] Bulldog   3 cups coffee per day, soda every other day      Social Drivers of Health   Financial Resource Strain: Low Risk  (08/10/2024)   Overall Financial Resource Strain (CARDIA)    Difficulty of Paying Living Expenses: Not hard at all  Food Insecurity: No Food Insecurity (08/10/2024)   Hunger Vital Sign    Worried About Running Out of Food in the Last Year: Never true    Ran Out of Food in the Last Year: Never true  Transportation Needs: No Transportation Needs (08/10/2024)   PRAPARE - Administrator, Civil Service (Medical): No    Lack of  Transportation (Non-Medical): No  Physical Activity: Insufficiently Active (08/10/2024)   Exercise Vital Sign    Days of Exercise per Week: 1 day    Minutes of Exercise per Session: 10 min  Stress: Stress Concern Present (08/10/2024)   [employer] of Occupational Health - Occupational Stress Questionnaire    Feeling of Stress: Rather much  Social Connections: Moderately Isolated (08/10/2024)   Social Connection and Isolation Panel    Frequency of Communication with Friends and Family: More than three times a week    Frequency of Social Gatherings with Friends and Family: Twice a week    Attends Religious Services: Patient declined    Database administrator or Organizations: No    Attends Engineer, structural: Not on file    Marital Status: Married  Catering manager Violence: Not on file    Outpatient Medications Prior to Visit  Medication Sig Dispense Refill   Armodafinil  250 MG tablet Take 1 tablet (250 mg total) by mouth daily. 30 tablet 2   brexpiprazole  (REXULTI ) 1 MG TABS tablet Take 1.5 tablets (1.5 mg total) by mouth daily. Needs appointment. 45 tablet 1   buPROPion  (WELLBUTRIN  XL) 150 MG 24 hr tablet Take 1 tablet (150 mg total) by mouth daily. 90 tablet 0   buPROPion  (WELLBUTRIN  XL) 300 MG 24 hr tablet Take 1 tablet (300 mg total) by mouth daily after breakfast 90 tablet 0   DULoxetine  (CYMBALTA ) 60 MG capsule Take 1 capsule (60 mg total) by mouth daily after breakfast. 30 capsule 0   hydrOXYzine  (ATARAX ) 25 MG tablet Take 1 tablet (25 mg total) by mouth daily. 30 tablet 2   meclizine  (ANTIVERT ) 25 MG tablet Take 1 tablet (25 mg total) by mouth 3 (three) times daily as needed for dizziness. 30 tablet 0   pantoprazole  (PROTONIX ) 40 MG tablet Take 1 tablet (40 mg total) by mouth twice daily with first dose 1/2 to 1 hour prior to morning meal. 180 tablet 3   promethazine  (PHENERGAN ) 25 MG tablet Take 1 tablet (25 mg total) by mouth every 8 (eight) hours as needed for  nausea or vomiting. 20 tablet 0   scopolamine  (TRANSDERM-SCOP) 1 MG/3DAYS Place 1 patch (1.5 mg total) onto the skin every 3 (three) days. 4 patch 0   sucralfate  (CARAFATE ) 1 g tablet Take 1 tablet (1 g total) by mouth 4 (four) times daily -  with meals and at bedtime. 120 tablet 5   SUMAtriptan  (IMITREX ) 50 MG tablet Take 1 tablet (50 mg total) by mouth at the onset of a migraine. Repeat in 2 hours. Max of 2 tablets in 24 hours. 12 tablet 6   pantoprazole  (PROTONIX ) 40 MG tablet Take 1 tablet (40 mg total) by mouth daily.  30 tablet 5   pantoprazole  (PROTONIX ) 40 MG tablet Take 1 tablet (40 mg total) by mouth twice daily with first dose 1/2 to 1 hour prior to morning meal. Must keep appt in Oct 2025 no refills if appt is missed. 60 tablet 2   primidone  (MYSOLINE ) 50 MG tablet Take 0.5 tablets (25 mg total) by mouth at bedtime. 30 tablet 5   sucralfate  (CARAFATE ) 1 g tablet Take 1 tablet (1 g total) by mouth on an empty stomach 4 (four) times daily. 360 tablet 3   No facility-administered medications prior to visit.    Allergies  Allergen Reactions   Propranolol      Dizziness   Phenergan  [Promethazine  Hcl] Other (See Comments)    Restlessness w/ IV Phenergen. Probable mild EPS. Tolerates if taken w/ Benadryl .     Review of Systems  Constitutional:  Negative for weight loss.  HENT:  Negative for congestion and hearing loss.   Eyes:  Negative for blurred vision.  Respiratory:  Negative for cough.   Cardiovascular:  Negative for leg swelling.  Gastrointestinal:  Positive for constipation. Negative for diarrhea.  Genitourinary:  Negative for dysuria and frequency.  Musculoskeletal:  Positive for joint pain (some right hip pain). Negative for myalgias.  Skin:  Negative for rash.  Neurological:  Positive for headaches.  Psychiatric/Behavioral:  Negative for depression. The patient is not nervous/anxious.        Objective:    Physical Exam   BP 119/69 (BP Location: Right Arm, Patient  Position: Sitting, Cuff Size: Normal)   Pulse 84   Temp 98.8 F (37.1 C) (Oral)   Resp 16   Ht 5' 4 (1.626 m)   Wt 188 lb 3.2 oz (85.4 kg)   SpO2 97%   BMI 32.30 kg/m  Wt Readings from Last 3 Encounters:  08/10/24 188 lb 3.2 oz (85.4 kg)  06/28/24 187 lb 6.4 oz (85 kg)  06/22/24 174 lb (78.9 kg)  Physical Exam  Constitutional: She is oriented to person, place, and time. She appears well-developed and well-nourished. No distress.  HENT:  Head: Normocephalic and atraumatic.  Right Ear: Tympanic membrane and ear canal normal.  Left Ear: Tympanic membrane and ear canal normal.  Mouth/Throat: Oropharynx is clear and moist.  Eyes: Pupils are equal, round, and reactive to light. No scleral icterus.  Neck: Normal range of motion. No thyromegaly present.  Cardiovascular: Normal rate and regular rhythm.   No murmur heard. Pulmonary/Chest: Effort normal and breath sounds normal. No respiratory distress. He has no wheezes. She has no rales. She exhibits no tenderness.  Abdominal: Soft. Bowel sounds are normal. She exhibits no distension and no mass. There is no tenderness. There is no rebound and no guarding.  Musculoskeletal: She exhibits no edema.  Lymphadenopathy:    She has no cervical adenopathy.  Neurological: She is alert and oriented to person, place, and time. She has normal patellar reflexes. She exhibits normal muscle tone. Coordination normal. Resting hand tremor noted bilaterally Skin: Skin is warm and dry.  Psychiatric: She has a normal mood and affect. Her behavior is normal. Judgment and thought content normal.  Breast/Pelvic: deferred       Assessment & Plan:        Assessment & Plan:   Problem List Items Addressed This Visit       Unprioritized   Stress incontinence   Failed pelvic floor PT. Refer to Urogynecology for further evaluation.       Relevant Orders  Ambulatory referral to Urogynecology   Preventative health care - Primary    Discussed health  maintenance, weight gain likely due to decreased activity. Continue to work on improving diet and encouraged exercise as tolerated - Administer flu shot. - Order thyroid  function test to evaluate weight gain. - Pap up to date      POTS (postural orthostatic tachycardia syndrome)   This is a recent diagnosis for her. She is managing with salt tablets. Follows with EP team.       Relevant Orders   Comp Met (CMET)   Essential tremor   Uncontrolled, resume primidone .      Relevant Medications   primidone  (MYSOLINE ) 50 MG tablet   Chronic migraine w/o aura w/o status migrainosus, not intractable   Uncontrolled.  Will give trial of nurtec every other day for prophylaxis while she waits to get back in with neurology.       Relevant Medications   primidone  (MYSOLINE ) 50 MG tablet   Rimegepant Sulfate (NURTEC) 75 MG TBDP   Other Visit Diagnoses       Needs flu shot       Relevant Orders   Flu vaccine trivalent PF, 6mos and older(Flulaval ,Afluria,Fluarix,Fluzone) (Completed)     Weight gain       Relevant Orders   TSH     RLS (restless legs syndrome)       Relevant Orders   CBC w/Diff   IBC + Ferritin     Other migraine without status migrainosus, not intractable       Relevant Medications   primidone  (MYSOLINE ) 50 MG tablet   Rimegepant Sulfate (NURTEC) 75 MG TBDP       I have discontinued Deshia E. Manon's SUMAtriptan . I have also changed her primidone . Additionally, I am having her start on Nurtec. Lastly, I am having her maintain her promethazine , meclizine , sucralfate , scopolamine , Armodafinil , hydrOXYzine , buPROPion , buPROPion , brexpiprazole , DULoxetine , and pantoprazole .  Meds ordered this encounter  Medications   primidone  (MYSOLINE ) 50 MG tablet    Sig: Take 1/2 tablets (25 mg total) by mouth at bedtime.    Dispense:  30 tablet    Refill:  5    Supervising Provider:   DOMENICA BLACKBIRD A [[id number]]   Rimegepant Sulfate (NURTEC) 75 MG TBDP    Sig: Take 1 tablet (75 mg  total) by mouth every other day.    Dispense:  30 tablet    Refill:  5    Supervising Provider:   DOMENICA BLACKBIRD A [[id number]]

## 2024-08-10 NOTE — Assessment & Plan Note (Signed)
 Uncontrolled.  Will give trial of nurtec every other day for prophylaxis while she waits to get back in with neurology.

## 2024-08-10 NOTE — Patient Instructions (Signed)
 VISIT SUMMARY:  You came in for your annual physical exam. We discussed your ongoing health issues, including migraines, POTS, essential tremor, depression and anxiety, gastritis, stress urinary incontinence, and restless legs syndrome. We also talked about your general health maintenance and weight management.  YOUR PLAN:  MIGRAINE: You experience frequent migraines in clusters, which can cause confusion and make it hard to take your medication on time. -We will prescribe Nurtec for both prevention and rescue, pending insurance authorization. -You can also take ibuprofen , acetaminophen , or Excedrin as needed. -Please follow up in a few weeks to assess your improvement.  POSTURAL ORTHOSTATIC TACHYCARDIA SYNDROME (POTS): You manage POTS with increased sodium intake and avoid propranolol  due to dizziness and low blood pressure. -Continue with your increased sodium intake. -Monitor your symptoms and we will adjust your management plan as needed.  ESSENTIAL TREMOR: You have hand tremors that affect your work, and you plan to resume taking primidone . -We will prescribe primidone  and encourage you to take it regularly. -Follow up with neurology in February.  DEPRESSION AND ANXIETY: You manage depression and anxiety with medication, currently taking Rexulti  1.5 mg and considering an increase to 2 mg. -Continue your current medication regimen. -Contact your mental health provider if your symptoms worsen.  GASTRITIS: You have chronic gastritis managed with Protonix  and Carafate , but timing the medications can be difficult due to interactions. -Continue taking Protonix  and Carafate . -We will advise you on the best timing for taking your medications.  STRESS URINARY INCONTINENCE: You experience occasional accidents and have not found pelvic floor therapy to be well-tolerated. -We will refer you to urogynecology for further evaluation and management options, including a pessary or surgical  interventions.  RESTLESS LEGS SYNDROME: You have restless legs syndrome, and although you do not have sleep apnea, using a CPAP machine has improved your symptoms. -Continue using your CPAP machine as it helps with your symptoms.  ADULT WELLNESS VISIT: We discussed your general health maintenance and weight gain, which is likely due to decreased activity. Swimming is recommended as it is the least bothersome exercise for you. -We administered your flu shot today. -We will order a thyroid  function test to evaluate your weight gain.

## 2024-08-10 NOTE — Assessment & Plan Note (Addendum)
 This is a recent diagnosis for her. She is managing with salt tablets. Follows with EP team.

## 2024-08-10 NOTE — Telephone Encounter (Signed)
 Pharmacy Patient Advocate Encounter   Received notification from Pt Calls Messages that prior authorization for Nurtec 75MG  dispersible tablets  is required/requested.   Insurance verification completed.   The patient is insured through [facility].   Per test claim: PA required; PA submitted to above mentioned insurance via Latent Key/confirmation #/EOC [id number] Status is pending

## 2024-08-11 ENCOUNTER — Other Ambulatory Visit: Payer: Self-pay | Admitting: Adult Health

## 2024-08-11 ENCOUNTER — Ambulatory Visit: Payer: Self-pay | Admitting: Family

## 2024-08-11 ENCOUNTER — Other Ambulatory Visit (HOSPITAL_COMMUNITY): Payer: Self-pay

## 2024-08-11 DIAGNOSIS — F332 Major depressive disorder, recurrent severe without psychotic features: Secondary | ICD-10-CM | POA: Diagnosis not present

## 2024-08-11 DIAGNOSIS — G4726 Circadian rhythm sleep disorder, shift work type: Secondary | ICD-10-CM

## 2024-08-11 DIAGNOSIS — E611 Iron deficiency: Secondary | ICD-10-CM | POA: Insufficient documentation

## 2024-08-11 MED ORDER — IRON (FERROUS SULFATE) 325 (65 FE) MG PO TABS: 325.0000 mg | ORAL_TABLET | Freq: Every day | ORAL | Status: AC

## 2024-08-11 NOTE — Telephone Encounter (Signed)
 Iron level is low, please add iron 325mg  one tablet by mouth every other day for mild iron deficiency.   Repeat lab work in 3 months.

## 2024-08-12 NOTE — Telephone Encounter (Signed)
 Due for FU now, sent MyChart message.

## 2024-08-15 ENCOUNTER — Other Ambulatory Visit (HOSPITAL_COMMUNITY): Payer: Self-pay

## 2024-08-15 ENCOUNTER — Telehealth: Payer: Self-pay | Admitting: Adult Health

## 2024-08-15 DIAGNOSIS — G4726 Circadian rhythm sleep disorder, shift work type: Secondary | ICD-10-CM

## 2024-08-16 NOTE — Telephone Encounter (Signed)
 Please call to schedule FU, did not respond to MyChart msg.

## 2024-08-17 ENCOUNTER — Encounter: Admitting: Family

## 2024-08-17 NOTE — Telephone Encounter (Signed)
 Patient called for refill on Armodafinil  250mg . Ph: [phone] Appt 10/30 Pharmacy [facility] [address] [place], [place]

## 2024-08-17 NOTE — Telephone Encounter (Signed)
 Pt has appt with [facility] tomorrow. Will send RF as appropriate.

## 2024-08-18 ENCOUNTER — Encounter: Payer: Self-pay | Admitting: Adult Health

## 2024-08-18 ENCOUNTER — Other Ambulatory Visit (HOSPITAL_COMMUNITY): Payer: Self-pay

## 2024-08-18 ENCOUNTER — Telehealth (INDEPENDENT_AMBULATORY_CARE_PROVIDER_SITE_OTHER): Admitting: Adult Health

## 2024-08-18 DIAGNOSIS — F4010 Social phobia, unspecified: Secondary | ICD-10-CM

## 2024-08-18 DIAGNOSIS — G4726 Circadian rhythm sleep disorder, shift work type: Secondary | ICD-10-CM

## 2024-08-18 DIAGNOSIS — F410 Panic disorder [episodic paroxysmal anxiety] without agoraphobia: Secondary | ICD-10-CM

## 2024-08-18 DIAGNOSIS — F330 Major depressive disorder, recurrent, mild: Secondary | ICD-10-CM | POA: Diagnosis not present

## 2024-08-18 DIAGNOSIS — F411 Generalized anxiety disorder: Secondary | ICD-10-CM | POA: Diagnosis not present

## 2024-08-18 MED ORDER — BREXPIPRAZOLE 1 MG PO TABS
1.5000 mg | ORAL_TABLET | Freq: Every day | ORAL | 1 refills | Status: AC
  Filled 2024-08-18 – 2024-09-06 (×2): qty 135, 90d supply, fill #0

## 2024-08-18 MED ORDER — BUPROPION HCL ER (XL) 150 MG PO TB24
150.0000 mg | ORAL_TABLET | Freq: Every day | ORAL | 1 refills | Status: AC
  Filled 2024-08-18 – 2024-10-12 (×2): qty 90, 90d supply, fill #0

## 2024-08-18 MED ORDER — BUPROPION HCL ER (XL) 300 MG PO TB24
300.0000 mg | ORAL_TABLET | Freq: Every day | ORAL | 1 refills | Status: AC
  Filled 2024-08-18 – 2024-10-12 (×2): qty 90, 90d supply, fill #0

## 2024-08-18 MED ORDER — DULOXETINE HCL 60 MG PO CPEP
60.0000 mg | ORAL_CAPSULE | Freq: Every day | ORAL | 1 refills | Status: AC
  Filled 2024-08-18: qty 90, 90d supply, fill #0
  Filled 2024-11-18: qty 90, 90d supply, fill #1

## 2024-08-18 MED ORDER — ARMODAFINIL 250 MG PO TABS
250.0000 mg | ORAL_TABLET | Freq: Every day | ORAL | 0 refills | Status: AC
  Filled 2024-08-18: qty 90, 90d supply, fill #0

## 2024-08-18 NOTE — Progress Notes (Signed)
 Julie Webb [id number] 1991-08-29 33 y.o.  Virtual Visit via Video Note  I connected with pt @ on 08/18/24 at  1:00 PM EDT by a video enabled telemedicine application and verified that I am speaking with the correct person using two identifiers.   I discussed the limitations of evaluation and management by telemedicine and the availability of in person appointments. The patient expressed understanding and agreed to proceed.  I discussed the assessment and treatment plan with the patient. The patient was provided an opportunity to ask questions and all were answered. The patient agreed with the plan and demonstrated an understanding of the instructions.   The patient was advised to call back or seek an in-person evaluation if the symptoms worsen or if the condition fails to improve as anticipated.  I provided 25 minutes of non-face-to-face time during this encounter.  The patient was located at home.  The provider was located at [facility].   Angeline LOISE Sayers, NP   Subjective:   Patient ID:  Julie Webb is a 33 y.o. (DOB 1991-07-31) female.  Chief Complaint: No chief complaint on file.   HPI Julie Webb presents for follow-up of SAD, panic disorder, GAD, MDD.  Describes mood today as ok. Pleasant. Tearful at times. Mood symptoms - reports some depression. Reports decreased interest and motivation. Denies anxiety or irritability. Denies panic attacks. Reports worry, rumination, and over thinking. Reports mood is stable. Stating I feel like I'm doing alright. Feels like medications are helpful. Taking medications as prescribed.  Energy levels lower. Active, does not have a regular exercise routine.  Enjoys some usual interests and activities. Married. Lives with husband and son (4 years). Spending time with family. Appetite adequate - decreased GI issues - taking Protonix  and Carafate . Weight gain 180 pounds. Sleeps well most nights. Averages 8 hours. Denies  daytime napping.  Focus and concentration stable. Completing tasks. Managing aspects of household. 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  Denies SI or HI.  Denies AH or VH. Denies self harm. Denies substance use.    Review of Systems:  Review of Systems  Musculoskeletal:  Negative for gait problem.  Neurological:  Negative for tremors.  Psychiatric/Behavioral:         Please refer to HPI    Medications: I have reviewed the patient's current medications.  Current Outpatient Medications  Medication Sig Dispense Refill   Armodafinil  250 MG tablet Take 1 tablet (250 mg total) by mouth daily. 90 tablet 0   brexpiprazole  (REXULTI ) 1 MG TABS tablet Take 1.5 tablets (1.5 mg total) by mouth daily. Needs appointment. 135 tablet 1   buPROPion  (WELLBUTRIN  XL) 150 MG 24 hr tablet Take 1 tablet (150 mg total) by mouth daily. 90 tablet 1   buPROPion  (WELLBUTRIN  XL) 300 MG 24 hr tablet Take 1 tablet (300 mg total) by mouth daily after breakfast 90 tablet 1   DULoxetine  (CYMBALTA ) 60 MG capsule Take 1 capsule (60 mg total) by mouth daily after breakfast. 90 capsule 1   hydrOXYzine  (ATARAX ) 25 MG tablet Take 1 tablet (25 mg total) by mouth daily. 30 tablet 2   Iron, Ferrous Sulfate, 325 (65 Fe) MG TABS Take 325 mg by mouth daily.     meclizine  (ANTIVERT ) 25 MG tablet Take 1 tablet (25 mg total) by mouth 3 (three) times daily as needed for dizziness. 30 tablet 0   pantoprazole  (PROTONIX ) 40 MG tablet Take 1 tablet (40 mg total) by mouth twice  daily with first dose 1/2 to 1 hour prior to morning meal. 180 tablet 3   primidone  (MYSOLINE ) 50 MG tablet Take 1/2 tablets (25 mg total) by mouth at bedtime. 30 tablet 5   promethazine  (PHENERGAN ) 25 MG tablet Take 1 tablet (25 mg total) by mouth every 8 (eight) hours as needed for nausea or vomiting. 20 tablet 0   Rimegepant Sulfate (NURTEC) 75 MG TBDP Take 1 tablet (75 mg total) by mouth every other day. 30 tablet 5   scopolamine   (TRANSDERM-SCOP) 1 MG/3DAYS Place 1 patch (1.5 mg total) onto the skin every 3 (three) days. 4 patch 0   sucralfate  (CARAFATE ) 1 g tablet Take 1 tablet (1 g total) by mouth 4 (four) times daily -  with meals and at bedtime. 120 tablet 5   No current facility-administered medications for this visit.    Medication Side Effects: None  Allergies:  Allergies  Allergen Reactions   Propranolol      Dizziness   Phenergan  [Promethazine  Hcl] Other (See Comments)    Restlessness w/ IV Phenergen. Probable mild EPS. Tolerates if taken w/ Benadryl .     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   Migraines    Vaginal Pap smear, abnormal     Family History  Problem Relation Age of Onset   Hyperlipidemia Mother    Mental illness Mother    Fibroids Mother        uterine   Hypertension Father    Heart attack Father    Heart disease Maternal Grandmother    Hyperlipidemia Maternal Grandmother    Hypertension Maternal Grandmother    Heart disease Maternal Grandfather    Hyperlipidemia Maternal Grandfather    Hypertension Maternal Grandfather    Pancreatic cancer Paternal Grandmother    Mesothelioma Paternal Grandfather     Social History   Socioeconomic History   Marital status: Married    Spouse name: Not on file   Number of children: Not on file   Years of education: Not on file   Highest education level: Bachelor's degree (e.g., BA, AB, BS)  Occupational History   Not on file  Tobacco Use   Smoking status: Never   Smokeless tobacco: Never  Vaping Use   Vaping status: Never Used  Substance and Sexual Activity   Alcohol use: Yes    Comment: occasional   Drug use: No   Sexual activity: Yes    Birth control/protection: I.U.D.  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   Has a son born 11/23/2019   Grew up in [place]   Enjoys reading   Right handed   Has an English Bulldog   3 cups coffee per day, soda every other day      Social Drivers of Corporate Investment Banker Strain: Low Risk  (08/10/2024)   Overall Financial Resource Strain (CARDIA)    Difficulty of Paying Living Expenses: Not hard at all  Food Insecurity: No Food Insecurity (08/10/2024)   Hunger Vital Sign    Worried About Running Out of Food in the Last Year: Never true    Ran Out of Food in the Last  Year: Never true  Transportation Needs: No Transportation Needs (08/10/2024)   PRAPARE - Administrator, Civil Service (Medical): No    Lack of Transportation (Non-Medical): No  Physical Activity: Insufficiently Active (08/10/2024)   Exercise Vital Sign    Days of Exercise per Week: 1 day    Minutes of Exercise per Session: 10 min  Stress: Stress Concern Present (08/10/2024)   [employer] of Occupational Health - Occupational Stress Questionnaire    Feeling of Stress: Rather much  Social Connections: Moderately Isolated (08/10/2024)   Social Connection and Isolation Panel    Frequency of Communication with Friends and Family: More than three times a week    Frequency of Social Gatherings with Friends and Family: Twice a week    Attends Religious Services: Patient declined    Database Administrator or Organizations: No    Attends Engineer, Structural: Not on file    Marital Status: Married  Catering Manager Violence: Not on file    Past Medical History, Surgical history, Social history, and Family history were reviewed and updated as appropriate.   Please see review of systems for further details on the patient's review from today.   Objective:   Physical Exam:  There were no vitals taken for this visit.  Physical Exam Constitutional:      General: She is not in acute  distress. Musculoskeletal:        General: No deformity.  Neurological:     Mental Status: She is alert and oriented to person, place, and time.     Coordination: Coordination normal.  Psychiatric:        Attention and Perception: Attention and perception normal. She does not perceive auditory or visual hallucinations.        Mood and Affect: Mood normal. Mood is not anxious or depressed. Affect is not labile, blunt, angry or inappropriate.        Speech: Speech normal.        Behavior: Behavior normal.        Thought Content: Thought content normal. Thought content is not paranoid or delusional. Thought content does not include homicidal or suicidal ideation. Thought content does not include homicidal or suicidal plan.        Cognition and Memory: Cognition and memory normal.        Judgment: Judgment normal.     Comments: Insight intact     Lab Review:     Component Value Date/Time   NA 139 08/10/2024 1208   K 4.5 08/10/2024 1208   CL 105 08/10/2024 1208   CO2 28 08/10/2024 1208   GLUCOSE 92 08/10/2024 1208   BUN 10 08/10/2024 1208   CREATININE 0.85 08/10/2024 1208   CALCIUM 9.4 08/10/2024 1208   PROT 7.0 08/10/2024 1208   ALBUMIN 4.4 08/10/2024 1208   AST 12 08/10/2024 1208   ALT 11 08/10/2024 1208   ALKPHOS 63 08/10/2024 1208   BILITOT 0.3 08/10/2024 1208   GFRNONAA >60 03/22/2023 1825   GFRAA >60 06/05/2019 1928       Component Value Date/Time   WBC 6.0 08/10/2024 1208   RBC 4.87 08/10/2024 1208   HGB 14.3 08/10/2024 1208   HCT 43.0 08/10/2024 1208   PLT 247.0 08/10/2024 1208   MCV 88.3 08/10/2024 1208   MCH 29.9 03/22/2023 1825   MCHC 33.2 08/10/2024 1208   RDW 13.1 08/10/2024 1208   LYMPHSABS 1.6 08/10/2024 1208   MONOABS 0.5 08/10/2024 1208  EOSABS 0.1 08/10/2024 1208   BASOSABS 0.0 08/10/2024 1208    No results found for: POCLITH, LITHIUM   No results found for: PHENYTOIN, PHENOBARB, VALPROATE, CBMZ   .res Assessment: Plan:     Plan:  PDMP reviewed  Hydroxyzine  25mg  daily as needed for anxiety Cymbalta  60mg  daily Wellbutrin  XL 450mg  daily - denies seizure history - move to morning 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 Discussed continuing current medication regimen.  Patient advised to contact office with any questions, adverse effects, or acute worsening in signs and symptoms.  Discussed potential metabolic side effects associated with atypical antipsychotics, as well as potential risk for movement side effects. Advised pt to contact office if movement side effects occur.   Diagnoses and all orders for this visit:  Mild recurrent major depression -     brexpiprazole  (REXULTI ) 1 MG TABS tablet; Take 1.5 tablets (1.5 mg total) by mouth daily. Needs appointment. -     buPROPion  (WELLBUTRIN  XL) 150 MG 24 hr tablet; Take 1 tablet (150 mg total) by mouth daily. -     buPROPion  (WELLBUTRIN  XL) 300 MG 24 hr tablet; Take 1 tablet (300 mg total) by mouth daily after breakfast -     DULoxetine  (CYMBALTA ) 60 MG capsule; Take 1 capsule (60 mg total) by mouth daily after breakfast.  Generalized anxiety disorder -     brexpiprazole  (REXULTI ) 1 MG TABS tablet; Take 1.5 tablets (1.5 mg total) by mouth daily. Needs appointment. -     buPROPion  (WELLBUTRIN  XL) 150 MG 24 hr tablet; Take 1 tablet (150 mg total) by mouth daily. -     buPROPion  (WELLBUTRIN  XL) 300 MG 24 hr tablet; Take 1 tablet (300 mg total) by mouth daily after breakfast -     DULoxetine  (CYMBALTA ) 60 MG capsule; Take 1 capsule (60 mg total) by mouth daily after breakfast.  Social anxiety disorder -     DULoxetine  (CYMBALTA ) 60 MG capsule; Take 1 capsule (60 mg  total) by mouth daily after breakfast.  Panic disorder -     DULoxetine  (CYMBALTA ) 60 MG capsule; Take 1 capsule (60 mg total) by mouth daily after breakfast.  Shift work sleep disorder -     Armodafinil  250 MG tablet; Take 1 tablet (250 mg total) by mouth daily.     Please see After Visit Summary for patient specific instructions.  Future Appointments  Date Time Provider Department Center  09/27/2024  2:00 PM Parrett, Madelin RAMAN, NP [facility] [id number]  12/08/2024 10:30 AM Onita Duos, MD [facility] None  09/12/2025 10:40 AM Daryl Setter, NP [facility] [id number]    No orders of the defined types were placed in this encounter.     -------------------------------

## 2024-08-19 ENCOUNTER — Other Ambulatory Visit: Payer: Self-pay

## 2024-08-25 ENCOUNTER — Other Ambulatory Visit (HOSPITAL_COMMUNITY): Payer: Self-pay

## 2024-09-06 ENCOUNTER — Other Ambulatory Visit (HOSPITAL_COMMUNITY): Payer: Self-pay

## 2024-09-06 ENCOUNTER — Other Ambulatory Visit: Payer: Self-pay

## 2024-09-06 ENCOUNTER — Other Ambulatory Visit: Payer: Self-pay | Admitting: Family

## 2024-09-06 MED ORDER — SUCRALFATE 1 G PO TABS
1.0000 g | ORAL_TABLET | Freq: Three times a day (TID) | ORAL | 5 refills | Status: AC
  Filled 2024-09-06: qty 120, 30d supply, fill #0

## 2024-09-07 ENCOUNTER — Other Ambulatory Visit (HOSPITAL_COMMUNITY): Payer: Self-pay

## 2024-09-08 DIAGNOSIS — F332 Major depressive disorder, recurrent severe without psychotic features: Secondary | ICD-10-CM | POA: Diagnosis not present

## 2024-09-16 ENCOUNTER — Other Ambulatory Visit (HOSPITAL_COMMUNITY): Payer: Self-pay

## 2024-09-16 ENCOUNTER — Telehealth: Admitting: Family Medicine

## 2024-09-16 DIAGNOSIS — J069 Acute upper respiratory infection, unspecified: Secondary | ICD-10-CM

## 2024-09-16 MED ORDER — GUAIFENESIN 200 MG PO TABS
200.0000 mg | ORAL_TABLET | ORAL | 0 refills | Status: AC | PRN
  Filled 2024-09-16: qty 30, 5d supply, fill #0

## 2024-09-16 MED ORDER — FLUTICASONE PROPIONATE 50 MCG/ACT NA SUSP
2.0000 | Freq: Every day | NASAL | 6 refills | Status: AC
  Filled 2024-09-16: qty 16, 30d supply, fill #0

## 2024-09-16 MED ORDER — BENZONATATE 200 MG PO CAPS
200.0000 mg | ORAL_CAPSULE | Freq: Two times a day (BID) | ORAL | 0 refills | Status: AC | PRN
  Filled 2024-09-16: qty 20, 10d supply, fill #0

## 2024-09-16 NOTE — Patient Instructions (Signed)
 Viral Respiratory Infection A respiratory infection is an illness that affects part of the respiratory system, such as the lungs, nose, or throat. A respiratory infection that is caused by a virus is called a viral respiratory infection. Common types of viral respiratory infections include: A cold. The flu (influenza). A respiratory syncytial virus (RSV) infection. What are the causes? This condition is caused by a virus. The virus may spread through contact with droplets or direct contact with infected people or their mucus or secretions. The virus may spread from person to person (is contagious). What are the signs or symptoms? Symptoms of this condition include: A stuffy or runny nose. A sore throat or cough. Shortness of breath or difficulty breathing. Yellow or green mucus (sputum). Other symptoms may include: A fever. Sweating or chills. Fatigue. Achy muscles. A headache. How is this diagnosed? This condition may be diagnosed based on: Your symptoms. A physical exam. Testing of secretions from the nose or throat. Chest X-ray. How is this treated? This condition may be treated with medicines, such as: Antiviral medicine. This may shorten the length of time a person has symptoms. Expectorants. These make it easier to cough up mucus. Decongestant nasal sprays. Acetaminophen or NSAIDs, such as ibuprofen, to relieve fever and pain. Antibiotic medicines are not prescribed for viral infections.This is because antibiotics are designed to kill bacteria. They do not kill viruses. Follow these instructions at home: Managing pain and congestion Take over-the-counter and prescription medicines only as told by your health care provider. If you have a sore throat, gargle with a mixture of salt and water 3-4 times a day or as needed. To make salt water, completely dissolve -1 tsp (3-6 g) of salt in 1 cup (237 mL) of warm water. Use nose drops made from salt water to ease congestion and  soften raw skin around your nose. Take 2 tsp (10 mL) of honey at bedtime to lessen coughing at night. Do not give honey to children who are younger than 1 year. Drink enough fluid to keep your urine pale yellow. This helps prevent dehydration and helps loosen up mucus. General instructions  Rest as much as possible. Do not drink alcohol. Do not use any products that contain nicotine or tobacco. These products include cigarettes, chewing tobacco, and vaping devices, such as e-cigarettes. If you need help quitting, ask your health care provider. Keep all follow-up visits. This is important. How is this prevented?     Get an annual flu shot. You may get the flu shot in late summer, fall, or winter. Ask your health care provider when you should get your flu shot. Avoid spreading your infection to other people. If you are sick: Wash your hands with soap and water often, especially after you cough or sneeze. Wash for at least 20 seconds. If soap and water are not available, use alcohol-based hand sanitizer. Cover your mouth when you cough. Cover your nose and mouth when you sneeze. Do not share cups or eating utensils. Clean commonly used objects often. Clean commonly touched surfaces. Stay home from work or school as told by your health care provider. Avoid contact with people who are sick during cold and flu season. This is generally fall and winter. Contact a health care provider if: Your symptoms last for 10 days or longer. Your symptoms get worse over time. You have severe sinus pain in your face or forehead. The glands in your jaw or neck become very swollen. You have shortness of breath. Get  help right away if you: Feel pain or pressure in your chest. Have trouble breathing. Faint or feel like you will faint. Have severe and persistent vomiting. Feel confused or disoriented. These symptoms may represent a serious problem that is an emergency. Do not wait to see if the symptoms will  go away. Get medical help right away. Call your local emergency services (911 in the [place] Do not drive yourself to the hospital. Summary A respiratory infection is an illness that affects part of the respiratory system, such as the lungs, nose, or throat. A respiratory infection that is caused by a virus is called a viral respiratory infection. Common types of viral respiratory infections include a cold, influenza, and respiratory syncytial virus (RSV) infection. Symptoms of this condition include a stuffy or runny nose, cough, fatigue, achy muscles, sore throat, and fevers or chills. Antibiotic medicines are not prescribed for viral infections. This is because antibiotics are designed to kill bacteria. They are not effective against viruses. This information is not intended to replace advice given to you by your health care provider. Make sure you discuss any questions you have with your health care provider. Document Revised: 01/10/2021 Document Reviewed: 01/10/2021 Elsevier Patient Education  2024 [employer].

## 2024-09-16 NOTE — Progress Notes (Signed)
 Virtual Visit Consent   Julie Webb, you are scheduled for a virtual visit with a [facility] provider today. Just as with appointments in the office, your consent must be obtained to participate. Your consent will be active for this visit and any virtual visit you may have with one of our providers in the next 365 days. If you have a MyChart account, a copy of this consent can be sent to you electronically.  As this is a virtual visit, video technology does not allow for your provider to perform a traditional examination. This may limit your provider's ability to fully assess your condition. If your provider identifies any concerns that need to be evaluated in person or the need to arrange testing (such as labs, EKG, etc.), we will make arrangements to do so. Although advances in technology are sophisticated, we cannot ensure that it will always work on either your end or our end. If the connection with a video visit is poor, the visit may have to be switched to a telephone visit. With either a video or telephone visit, we are not always able to ensure that we have a secure connection.  By engaging in this virtual visit, you consent to the provision of healthcare and authorize for your insurance to be billed (if applicable) for the services provided during this visit. Depending on your insurance coverage, you may receive a charge related to this service.  I need to obtain your verbal consent now. Are you willing to proceed with your visit today? Julie Webb has provided verbal consent on 09/16/2024 for a virtual visit (video or telephone). Julie Lamp, FNP  Date: 09/16/2024 8:50 AM   Virtual Visit via Video Note   I, Julie Webb, connected with  Julie Webb  ([id number], [birth year]) on 09/16/24 at  8:45 AM EST by a video-enabled telemedicine application and verified that I am speaking with the correct person using two identifiers.  Location: Patient: Virtual Visit Location Patient:  Home Provider: Virtual Visit Location Provider: Home Office   I discussed the limitations of evaluation and management by telemedicine and the availability of in person appointments. The patient expressed understanding and agreed to proceed.    History of Present Illness: Julie Webb is a 33 y.o. who identifies as a female who was assigned female at birth, and is being seen today for cough, head congestion, starting this am.No fever. No wheezing. No sob.   HPI: HPI  Problems:  Patient Active Problem List   Diagnosis Date Noted   Iron  deficiency 08/11/2024   Stress incontinence 08/10/2024   POTS (postural orthostatic tachycardia syndrome) 08/10/2024   Snoring 05/12/2023   Benign paroxysmal positional vertigo due to bilateral vestibular disorder 03/27/2023   Constipation 03/27/2023   Daytime somnolence 09/02/2022   Benign essential tremor 11/19/2021   Neck pain 09/13/2021   Nevus 08/09/2021   Preventative health care 07/23/2021   Nonallopathic lesion of cervical region 12/08/2019   Nonallopathic lesion of thoracic region 12/08/2019   Encounter for planned induction of labor 11/23/2019   Nonallopathic lesion of rib cage 10/08/2019   Rib pain on right side 10/07/2019   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   Panic disorder 11/01/2018   Generalized anxiety disorder 11/01/2018   Suicidal behavior with attempted self-injury (HCC)    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 Probable mild EPS. Tolerates if taken w/ Benadryl .    Medications:   Current Outpatient Medications:    benzonatate  (TESSALON ) 200 MG capsule, Take 1 capsule (200 mg total) by mouth 2 (two) times daily as needed for cough., Disp: 20 capsule, Rfl: 0   fluticasone (FLONASE) 50 MCG/ACT nasal spray, Place 2 sprays into both nostrils daily., Disp: 16 g, Rfl: 6   guaiFENesin 200 MG tablet, Take 1 tablet (200 mg total) by mouth every 4 (four) hours as needed for cough or to loosen phlegm., Disp: 30 suppository, Rfl: 0   Armodafinil  250 MG tablet, Take 1 tablet (250 mg total) by mouth daily., Disp: 90 tablet, Rfl: 0   brexpiprazole  (REXULTI ) 1 MG TABS tablet, Take 1.5 tablets (1.5 mg total) by mouth daily. Needs appointment., Disp: 135 tablet, Rfl: 1   buPROPion  (WELLBUTRIN  XL) 150 MG 24 hr tablet, Take 1 tablet (150 mg total) by mouth daily., Disp: 90 tablet, Rfl: 1   buPROPion  (WELLBUTRIN  XL) 300 MG 24 hr tablet, Take 1 tablet (300 mg total) by mouth daily after breakfast, Disp: 90 tablet, Rfl: 1   DULoxetine  (CYMBALTA ) 60 MG capsule, Take 1 capsule (60 mg total) by mouth daily after breakfast., Disp: 90 capsule, Rfl: 1   hydrOXYzine  (ATARAX ) 25 MG tablet, Take 1 tablet (25 mg total) by mouth daily., Disp: 30 tablet, Rfl: 2   Iron , Ferrous Sulfate , 325 (65 Fe) MG TABS, Take 325 mg by mouth daily., Disp: , Rfl:    meclizine  (ANTIVERT ) 25 MG tablet, Take 1 tablet (25 mg total) by mouth 3 (three) times daily as needed for dizziness., Disp: 30 tablet, Rfl: 0   pantoprazole  (PROTONIX ) 40 MG tablet, Take 1 tablet (40 mg total) by mouth twice daily with first dose 1/2 to 1 hour prior to morning meal., Disp: 180 tablet, Rfl: 3   primidone  (MYSOLINE ) 50 MG tablet, Take 1/2 tablets (25 mg total) by mouth at bedtime., Disp: 30 tablet, Rfl: 5   promethazine  (PHENERGAN ) 25 MG tablet, Take 1 tablet (25 mg total) by mouth every 8 (eight) hours as needed for nausea or vomiting., Disp: 20 tablet, Rfl: 0   Rimegepant Sulfate  (NURTEC) 75 MG TBDP, Take 1 tablet (75 mg total) by mouth every  other day., Disp: 30 tablet, Rfl: 5   scopolamine  (TRANSDERM-SCOP) 1 MG/3DAYS, Place 1 patch (1.5 mg total) onto the skin every 3 (three) days., Disp: 4 patch, Rfl: 0   sucralfate  (CARAFATE ) 1 g tablet, Take 1 tablet (1 g total) by mouth 4 (four) times daily -  with meals and at bedtime., Disp: 120 tablet, Rfl: 5  Observations/Objective: Patient is well-developed, well-nourished in no acute distress.  Resting comfortably  at home.  Head is normocephalic, atraumatic.  No labored breathing.  Speech is clear and coherent with logical content.  Patient is alert and oriented at baseline.    Assessment and Plan: 1. Viral URI with cough (Primary)  Increase fluids humidifier at night, tylenol  or ibuprofen , UC if sx worsen.   Follow Up Instructions: I discussed the assessment and treatment plan with the patient. The patient was provided an opportunity to ask questions and all were answered. The patient agreed with the plan and demonstrated an understanding  of the instructions.  A copy of instructions were sent to the patient via MyChart unless otherwise noted below.     The patient was advised to call back or seek an in-person evaluation if the symptoms worsen or if the condition fails to improve as anticipated.    Tacie Mccuistion, FNP

## 2024-09-27 ENCOUNTER — Ambulatory Visit: Admitting: Adult Health

## 2024-09-29 DIAGNOSIS — H5213 Myopia, bilateral: Secondary | ICD-10-CM | POA: Diagnosis not present

## 2024-09-29 DIAGNOSIS — H52223 Regular astigmatism, bilateral: Secondary | ICD-10-CM | POA: Diagnosis not present

## 2024-10-06 DIAGNOSIS — F332 Major depressive disorder, recurrent severe without psychotic features: Secondary | ICD-10-CM | POA: Diagnosis not present

## 2024-10-12 ENCOUNTER — Other Ambulatory Visit (HOSPITAL_COMMUNITY): Payer: Self-pay

## 2024-10-18 ENCOUNTER — Ambulatory Visit: Admitting: Adult Health

## 2024-10-27 ENCOUNTER — Telehealth: Payer: Self-pay

## 2024-10-27 NOTE — Telephone Encounter (Signed)
 PA approved Rexulti  1 mg $45/30 day, effective 10/27/24-10/26/25 [employer]

## 2024-11-08 ENCOUNTER — Encounter (HOSPITAL_COMMUNITY): Payer: Self-pay

## 2024-11-08 ENCOUNTER — Other Ambulatory Visit (HOSPITAL_COMMUNITY): Payer: Self-pay

## 2024-11-08 ENCOUNTER — Ambulatory Visit: Admitting: Neurology

## 2024-11-08 ENCOUNTER — Other Ambulatory Visit: Payer: Self-pay

## 2024-11-08 ENCOUNTER — Encounter: Payer: Self-pay | Admitting: Neurology

## 2024-11-08 ENCOUNTER — Telehealth (HOSPITAL_COMMUNITY): Payer: Self-pay

## 2024-11-08 VITALS — BP 131/85 | HR 98 | Ht 64.0 in | Wt 195.2 lb

## 2024-11-08 DIAGNOSIS — G250 Essential tremor: Secondary | ICD-10-CM

## 2024-11-08 DIAGNOSIS — G43809 Other migraine, not intractable, without status migrainosus: Secondary | ICD-10-CM | POA: Diagnosis not present

## 2024-11-08 DIAGNOSIS — G43E09 Chronic migraine with aura, not intractable, without status migrainosus: Secondary | ICD-10-CM | POA: Diagnosis not present

## 2024-11-08 DIAGNOSIS — R410 Disorientation, unspecified: Secondary | ICD-10-CM | POA: Insufficient documentation

## 2024-11-08 MED ORDER — AIMOVIG 70 MG/ML ~~LOC~~ SOAJ
70.0000 mg | SUBCUTANEOUS | 11 refills | Status: AC
  Filled 2024-11-08 – 2024-11-18 (×3): qty 1, 30d supply, fill #0

## 2024-11-08 MED ORDER — PRIMIDONE 50 MG PO TABS
25.0000 mg | ORAL_TABLET | Freq: Every day | ORAL | 3 refills | Status: DC
  Filled 2024-11-08: qty 45, 90d supply, fill #0

## 2024-11-08 MED ORDER — PRIMIDONE 50 MG PO TABS
50.0000 mg | ORAL_TABLET | Freq: Every day | ORAL | 3 refills | Status: AC
  Filled 2024-11-08 – 2024-11-18 (×2): qty 90, 90d supply, fill #0

## 2024-11-08 NOTE — Patient Instructions (Signed)
 Meds ordered this encounter  Medications   primidone  (MYSOLINE ) 50 MG tablet    Sig: Take 1/2 tablet (25 mg total) by mouth at bedtime.    Dispense:  90 tablet    Refill:  3   Erenumab -aooe (AIMOVIG ) 70 MG/ML SOAJ    Sig: Inject 70 mg into the skin every 30 (thirty) days.    Dispense:  1 mL    Refill:  11

## 2024-11-08 NOTE — Progress Notes (Signed)
 "  Chief Complaint  Patient presents with   Follow-up    Pt in room 14. Alone. Here for migraine follow up.      ASSESSMENT AND PLAN  Julie Webb is a 34 y.o. female   Chronic migraine  Worsening confusion, that is usually associated with her headache,  She would like to proceed with more evaluation to rule out structural abnormality, ordered MRI of the brain with and without contrast EEG  Aimovig  70 mg monthly as preventive medication  Nurtec as needed Essential tremor  Higher dose of primidone  50 mg daily   Return in 6 months  DIAGNOSTIC DATA (LABS, IMAGING, TESTING) - I reviewed patient records, labs, notes, testing and imaging myself where available.   MEDICAL HISTORY:  Julie Webb is a 34 year old female, seen in request by her primary care nurse practitioner Daryl Setter for evaluation of essential tremor, chronic migraine, she is accompanied by her husband at today's visit January 17, 2022   I reviewed and summarized the referring note. 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, civil (consulting), she describes difficulty starting IV needle, now she work [employer]  behind the computer and phone number, she denies difficulty handling her job  Only observe mild bilateral hand symmetric tremor drawing spiral circle  She also had long history of chronic migraine headaches, bilateral frontal pressure headaches, with light noise sensitivity, nauseous, Excedrin Migraine works, sleep is very helpful  Laboratory evaluation in February 2023 showed normal TSH, CMP CBC   UPDATE Nov 08 2024: She lost follow-up since last visit in November 2023 with Megan Start propranolol  for bilateral hands tremor, could not tolerated, cause dizziness, Imitrex  reported works well for moderate  headache, but not so sure for more severe prolonged headaches.  She continued to work at night shift on the weekend, also on polypharmacy due to her depression, anxiety,  She was put on primidone  25 mg every night for her tremor, which has been helpful, recently started iron  infusion, overall feels better  She used to have more frequent migraine about once a week, in October 2025, primary care started her on Nurtec as preventive medication every other day, now she has migraine headache every 3 to 4 weeks, but only taking Excedrin Migraine as needed for abortive treatment  The most worrisome symptoms for her is she has confusion spells during the headache, does not know where she is, whether she takes medication or not, this really bothers her. The confusion can last for couple hours,   PHYSICAL EXAM:   Vitals:   11/08/24 1135  BP: 131/85  Pulse: 98  Weight: 195 lb 3.2 oz (88.5 kg)  Height: 5' 4 (1.626 m)   Body mass index is 33.51 kg/m.  PHYSICAL EXAMNIATION:  Gen: NAD, conversant, well nourised, well groomed                     Cardiovascular: Regular rate rhythm, no peripheral edema, warm, nontender. Eyes: Conjunctivae clear without exudates or hemorrhage Neck: Supple, no carotid bruits. Pulmonary: Clear to auscultation bilaterally   NEUROLOGICAL EXAM:  MENTAL STATUS: Speech/cognition: Awake, alert, oriented to history taking and casual conversation CRANIAL NERVES: CN II: Visual fields are full to confrontation. Pupils are round equal and briskly reactive to light.  Funduscopy  examination was normal bilaterally CN III, IV, VI: extraocular movement are normal. No ptosis. CN V: Facial sensation is intact to light touch CN VII: Face is symmetric with normal eye closure  CN VIII: Hearing is normal to causal conversation. CN IX, X: Phonation is normal. CN XI: Head turning and shoulder shrug are intact  MOTOR: There is no pronator drift of out-stretched arms. Muscle bulk  and tone are normal. Muscle strength is normal.  REFLEXES: Reflexes are 2+ and symmetric at the biceps, triceps, knees, and ankles. Plantar responses are flexor.  SENSORY: Intact to light touch, pinprick and vibratory sensation are intact in fingers and toes.  COORDINATION: There is no trunk or limb dysmetria noted.  GAIT/STANCE: Posture is normal. Gait is steady   REVIEW OF SYSTEMS:  Full 14 system review of systems performed and notable only for as above All other review of systems were negative.   ALLERGIES: Allergies[1]  HOME MEDICATIONS: Current Outpatient Medications  Medication Sig Dispense Refill   Armodafinil  250 MG tablet Take 1 tablet (250 mg total) by mouth daily. 90 tablet 0   brexpiprazole  (REXULTI ) 1 MG TABS tablet Take 1.5 tablets (1.5 mg total) by mouth daily. Needs appointment. 135 tablet 1   buPROPion  (WELLBUTRIN  XL) 150 MG 24 hr tablet Take 1 tablet (150 mg total) by mouth daily. 90 tablet 1   buPROPion  (WELLBUTRIN  XL) 300 MG 24 hr tablet Take 1 tablet (300 mg total) by mouth daily after breakfast 90 tablet 1   DULoxetine  (CYMBALTA ) 60 MG capsule Take 1 capsule (60 mg total) by mouth daily after breakfast. 90 capsule 1   hydrOXYzine  (ATARAX ) 25 MG tablet Take 1 tablet (25 mg total) by mouth daily. 30 tablet 2   Iron , Ferrous Sulfate , 325 (65 Fe) MG TABS Take 325 mg by mouth daily.     meclizine  (ANTIVERT ) 25 MG tablet Take 1 tablet (25 mg total) by mouth 3 (three) times daily as needed for dizziness. 30 tablet 0   pantoprazole  (PROTONIX ) 40 MG tablet Take 1 tablet (40 mg total) by mouth twice daily with first dose 1/2 to 1 hour prior to morning meal. 180 tablet 3   primidone  (MYSOLINE ) 50 MG tablet Take 1/2 tablets (25 mg total) by mouth at bedtime. 30 tablet 5   promethazine  (PHENERGAN ) 25 MG tablet Take 1 tablet (25 mg total) by mouth every 8 (eight) hours as needed for nausea or vomiting. 20 tablet 0   Rimegepant Sulfate  (NURTEC) 75 MG TBDP Take 1 tablet (75  mg total) by mouth every other day. 30 tablet 5   scopolamine  (TRANSDERM-SCOP) 1 MG/3DAYS Place 1 patch (1.5 mg total) onto the skin every 3 (three) days. 4 patch 0   sucralfate  (CARAFATE ) 1 g tablet Take 1 tablet (1 g total) by mouth 4 (four) times daily -  with meals and at bedtime. 120 tablet 5   benzonatate  (TESSALON ) 200 MG capsule Take 1 capsule (200 mg total) by mouth 2 (two) times daily as needed for cough. (Patient not taking: Reported on 11/08/2024) 20 capsule 0   fluticasone  (FLONASE ) 50 MCG/ACT nasal spray Place 2 sprays into both nostrils daily. (Patient not taking: Reported on 11/08/2024) 16 g 6   guaiFENesin  200 MG tablet Take 1 tablet (200 mg total) by mouth every 4 (four) hours as needed for cough or to loosen phlegm. (Patient not taking: Reported on 11/08/2024) 30 suppository 0   No current facility-administered medications for this visit.    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   Migraines    Vaginal Pap smear, abnormal     PAST SURGICAL HISTORY: Past Surgical History:  Procedure Laterality Date   CHOLECYSTECTOMY Right 09/19/2020    FAMILY HISTORY: Family History  Problem Relation Age of Onset   Hyperlipidemia Mother    Mental illness Mother    Fibroids Mother        uterine   Hypertension Father    Heart attack Father    Heart disease Maternal Grandmother    Hyperlipidemia Maternal Grandmother    Hypertension Maternal Grandmother    Heart disease Maternal Grandfather    Hyperlipidemia Maternal Grandfather    Hypertension Maternal Grandfather    Pancreatic cancer Paternal Grandmother    Mesothelioma Paternal Grandfather     SOCIAL HISTORY: Social History   Socioeconomic History   Marital status: Married    Spouse name: Not on file   Number of  children: Not on file   Years of education: Not on file   Highest education level: Bachelor's degree (e.g., BA, AB, BS)  Occupational History   Not on file  Tobacco Use   Smoking status: Never   Smokeless tobacco: Never  Vaping Use   Vaping status: Never Used  Substance and Sexual Activity   Alcohol use: Yes    Comment: occasional   Drug use: No   Sexual activity: Yes    Birth control/protection: I.U.D.  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   Has a son born 11/23/2019   Grew up in [place]   Enjoys reading   Right handed   Has an English Bulldog   3 cups coffee per day, soda every other day      Social Drivers of Health   Tobacco Use: Low Risk (11/08/2024)   Patient History    Smoking Tobacco Use: Never    Smokeless Tobacco Use: Never    Passive Exposure: Not on file  Financial Resource Strain: Low Risk (08/10/2024)   Overall Financial Resource Strain (CARDIA)    Difficulty of Paying Living Expenses: Not hard at all  Food Insecurity: No Food Insecurity (08/10/2024)   Epic    Worried About Radiation Protection Practitioner of Food in the Last Year: Never true    Ran Out of Food in the Last Year: Never true  Transportation Needs: No Transportation Needs (08/10/2024)   Epic    Lack of Transportation (Medical): No    Lack of Transportation (Non-Medical): No  Physical Activity: Insufficiently Active (08/10/2024)   Exercise Vital Sign    Days of Exercise per Week: 1 day    Minutes of Exercise per Session: 10 min  Stress: Stress Concern Present (08/10/2024)   [employer] of Occupational Health - Occupational Stress Questionnaire    Feeling of Stress: Rather much  Social Connections: Moderately Isolated (08/10/2024)   Social Connection and Isolation Panel    Frequency of Communication with Friends and Family: More than three times a week    Frequency of Social Gatherings with Friends and Family: Twice a  week    Attends Religious Services: Patient declined    Active Member of Clubs or  Organizations: No    Attends Banker Meetings: Not on file    Marital Status: Married  Intimate Partner Violence: Not on file  Depression (PHQ2-9): Medium Risk (08/10/2024)   Depression (PHQ2-9)    PHQ-2 Score: 9  Alcohol Screen: Low Risk (08/10/2024)   Alcohol Screen    Last Alcohol Screening Score (AUDIT): 1  Housing: Low Risk (08/10/2024)   Epic    Unable to Pay for Housing in the Last Year: No    Number of Times Moved in the Last Year: 0    Homeless in the Last Year: No  Utilities: Not on file  Health Literacy: Not on file      Modena Callander, M.D. Ph.D.  [facility] [address], [address] [place], [place] [address] Ph: [phone] Fax: [phone]  CC:  Daryl Setter, NP [address], [address] [name],  [place] [address]  Daryl Setter, NP      [1]  Allergies Allergen Reactions   Propranolol      Dizziness   Phenergan  [Promethazine  Hcl] Other (See Comments)    Restlessness w/ IV Phenergen. Probable mild EPS. Tolerates if taken w/ Benadryl .    "

## 2024-11-09 ENCOUNTER — Other Ambulatory Visit: Admitting: *Deleted

## 2024-11-09 ENCOUNTER — Other Ambulatory Visit (HOSPITAL_COMMUNITY): Payer: Self-pay

## 2024-11-09 ENCOUNTER — Telehealth: Payer: Self-pay

## 2024-11-09 ENCOUNTER — Other Ambulatory Visit (HOSPITAL_BASED_OUTPATIENT_CLINIC_OR_DEPARTMENT_OTHER): Payer: Self-pay

## 2024-11-09 NOTE — Telephone Encounter (Signed)
 Pharmacy Patient Advocate Encounter   Received notification from Pt Calls Messages that prior authorization for Aimovig  70MG /ML auto-injectors is required/requested.   Insurance verification completed.   The patient is insured through [facility].   Prior Authorization for Aimovig  70MG /ML auto-injectors has been APPROVED from 11-09-2024 to 05-08-2025. Ran test claim, Copay is $759.53**. This test claim was processed through [facility]- copay amounts may vary at other pharmacies due to pharmacy/plan contracts, or as the patient moves through the different stages of their insurance plan.  **Patient has high deductible to meet.  PA #/Case ID/Reference #: [id number]

## 2024-11-09 NOTE — Telephone Encounter (Signed)
 PA approved. Patient has high deductible

## 2024-11-15 ENCOUNTER — Ambulatory Visit: Admitting: Neurology

## 2024-11-15 ENCOUNTER — Ambulatory Visit: Payer: Self-pay | Admitting: Neurology

## 2024-11-15 DIAGNOSIS — G43E09 Chronic migraine with aura, not intractable, without status migrainosus: Secondary | ICD-10-CM

## 2024-11-15 DIAGNOSIS — R410 Disorientation, unspecified: Secondary | ICD-10-CM

## 2024-11-15 DIAGNOSIS — R4182 Altered mental status, unspecified: Secondary | ICD-10-CM | POA: Diagnosis not present

## 2024-11-15 DIAGNOSIS — G250 Essential tremor: Secondary | ICD-10-CM

## 2024-11-15 NOTE — Procedures (Signed)
" ° °  HISTORY: 34 year old female history of migraine, intermittent confusion spells  TECHNIQUE:  This is a routine 16 channel EEG recording with one channel devoted to a limited EKG recording.  It was performed during wakefulness, drowsiness and asleep.  Hyperventilation and photic stimulation were performed as activating procedures.  There are minimum muscle and movement artifact noted.  Upon maximum arousal, posterior dominant waking rhythm consistent of rhythmic alpha range activity. Activities are symmetric over the bilateral posterior derivations and attenuated with eye opening.  Photic stimulation did not alter the tracing.  Hyperventilation produced mild/moderate buildup with higher amplitude and the slower activities noted.  During EEG recording, patient developed drowsiness and no deeper stage of sleep was achieved  During EEG recording, there was no epileptiform discharge noted.  EKG demonstrate normal sinus rhythm.  CONCLUSION: This is a  normal awake EEG.  There is no electrodiagnostic evidence of epileptiform discharge.  Frances Ambrosino, M.D. Ph.D.  [facility] [address] [place], [place] [address] Phone: [phone] Fax:      [phone]  "

## 2024-11-16 ENCOUNTER — Ambulatory Visit

## 2024-11-16 DIAGNOSIS — G250 Essential tremor: Secondary | ICD-10-CM | POA: Diagnosis not present

## 2024-11-16 DIAGNOSIS — G43E09 Chronic migraine with aura, not intractable, without status migrainosus: Secondary | ICD-10-CM | POA: Diagnosis not present

## 2024-11-16 DIAGNOSIS — R410 Disorientation, unspecified: Secondary | ICD-10-CM | POA: Diagnosis not present

## 2024-11-16 MED ORDER — GADOBENATE DIMEGLUMINE 529 MG/ML IV SOLN
18.0000 mL | Freq: Once | INTRAVENOUS | Status: AC | PRN
  Administered 2024-11-16: 18 mL via INTRAVENOUS

## 2024-11-18 ENCOUNTER — Encounter (HOSPITAL_COMMUNITY): Payer: Self-pay

## 2024-11-18 ENCOUNTER — Other Ambulatory Visit: Payer: Self-pay

## 2024-11-18 ENCOUNTER — Other Ambulatory Visit (HOSPITAL_COMMUNITY): Payer: Self-pay

## 2024-11-29 ENCOUNTER — Ambulatory Visit: Admitting: Adult Health

## 2024-12-08 ENCOUNTER — Ambulatory Visit: Admitting: Neurology

## 2025-09-12 ENCOUNTER — Encounter: Admitting: Family
